# Patient Record
Sex: Female | Born: 1970 | Race: Black or African American | Hispanic: No | Marital: Single | Smoking: Never smoker
Health system: Southern US, Community
[De-identification: ages and names within clinical notes are randomized; demographics above are authoritative.]

## PROBLEM LIST (undated history)

## (undated) DIAGNOSIS — E559 Vitamin D deficiency, unspecified: Secondary | ICD-10-CM

## (undated) DIAGNOSIS — F32A Depression, unspecified: Secondary | ICD-10-CM

## (undated) DIAGNOSIS — F419 Anxiety disorder, unspecified: Secondary | ICD-10-CM

## (undated) DIAGNOSIS — M5134 Other intervertebral disc degeneration, thoracic region: Secondary | ICD-10-CM

## (undated) DIAGNOSIS — M069 Rheumatoid arthritis, unspecified: Secondary | ICD-10-CM

## (undated) DIAGNOSIS — T8859XA Other complications of anesthesia, initial encounter: Secondary | ICD-10-CM

## (undated) DIAGNOSIS — T884XXA Failed or difficult intubation, initial encounter: Secondary | ICD-10-CM

## (undated) DIAGNOSIS — M545 Low back pain, unspecified: Secondary | ICD-10-CM

## (undated) DIAGNOSIS — F329 Major depressive disorder, single episode, unspecified: Secondary | ICD-10-CM

## (undated) DIAGNOSIS — T7840XA Allergy, unspecified, initial encounter: Secondary | ICD-10-CM

## (undated) DIAGNOSIS — M1990 Unspecified osteoarthritis, unspecified site: Secondary | ICD-10-CM

## (undated) DIAGNOSIS — E785 Hyperlipidemia, unspecified: Secondary | ICD-10-CM

## (undated) DIAGNOSIS — E669 Obesity, unspecified: Secondary | ICD-10-CM

## (undated) DIAGNOSIS — D649 Anemia, unspecified: Secondary | ICD-10-CM

## (undated) DIAGNOSIS — R51 Headache: Secondary | ICD-10-CM

## (undated) DIAGNOSIS — E119 Type 2 diabetes mellitus without complications: Secondary | ICD-10-CM

## (undated) DIAGNOSIS — E049 Nontoxic goiter, unspecified: Secondary | ICD-10-CM

## (undated) DIAGNOSIS — K635 Polyp of colon: Secondary | ICD-10-CM

## (undated) DIAGNOSIS — G8929 Other chronic pain: Secondary | ICD-10-CM

## (undated) DIAGNOSIS — K219 Gastro-esophageal reflux disease without esophagitis: Secondary | ICD-10-CM

## (undated) DIAGNOSIS — J45909 Unspecified asthma, uncomplicated: Secondary | ICD-10-CM

## (undated) DIAGNOSIS — I10 Essential (primary) hypertension: Secondary | ICD-10-CM

## (undated) DIAGNOSIS — Z9889 Other specified postprocedural states: Secondary | ICD-10-CM

## (undated) DIAGNOSIS — M797 Fibromyalgia: Secondary | ICD-10-CM

## (undated) DIAGNOSIS — J302 Other seasonal allergic rhinitis: Secondary | ICD-10-CM

## (undated) DIAGNOSIS — R519 Headache, unspecified: Secondary | ICD-10-CM

## (undated) HISTORY — DX: Type 2 diabetes mellitus without complications: E11.9

## (undated) HISTORY — DX: Low back pain, unspecified: M54.50

## (undated) HISTORY — DX: Major depressive disorder, single episode, unspecified: F32.9

## (undated) HISTORY — DX: Fibromyalgia: M79.7

## (undated) HISTORY — DX: Polyp of colon: K63.5

## (undated) HISTORY — DX: Headache: R51

## (undated) HISTORY — DX: Unspecified osteoarthritis, unspecified site: M19.90

## (undated) HISTORY — DX: Vitamin D deficiency, unspecified: E55.9

## (undated) HISTORY — PX: ABDOMINAL HYSTERECTOMY: SHX81

## (undated) HISTORY — DX: Headache, unspecified: R51.9

## (undated) HISTORY — PX: PARTIAL HYSTERECTOMY: SHX80

## (undated) HISTORY — DX: Obesity, unspecified: E66.9

## (undated) HISTORY — DX: Unspecified asthma, uncomplicated: J45.909

## (undated) HISTORY — DX: Anxiety disorder, unspecified: F41.9

## (undated) HISTORY — DX: Nontoxic goiter, unspecified: E04.9

## (undated) HISTORY — DX: Anemia, unspecified: D64.9

## (undated) HISTORY — PX: WISDOM TOOTH EXTRACTION: SHX21

## (undated) HISTORY — DX: Allergy, unspecified, initial encounter: T78.40XA

## (undated) HISTORY — DX: Gastro-esophageal reflux disease without esophagitis: K21.9

## (undated) HISTORY — DX: Other intervertebral disc degeneration, thoracic region: M51.34

## (undated) HISTORY — DX: Depression, unspecified: F32.A

## (undated) HISTORY — PX: BARTHOLIN GLAND CYST EXCISION: SHX565

## (undated) HISTORY — DX: Other chronic pain: G89.29

## (undated) HISTORY — DX: Failed or difficult intubation, initial encounter: T88.4XXA

## (undated) HISTORY — DX: Rheumatoid arthritis, unspecified: M06.9

## (undated) HISTORY — DX: Other seasonal allergic rhinitis: J30.2

## (undated) HISTORY — DX: Hyperlipidemia, unspecified: E78.5

---
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
## 2001-01-29 ENCOUNTER — Ambulatory Visit (HOSPITAL_COMMUNITY): Admission: RE | Admit: 2001-01-29 | Discharge: 2001-01-29 | Payer: Self-pay | Admitting: Pulmonary Disease

## 2003-03-13 HISTORY — PX: BACK SURGERY: SHX140

## 2003-06-01 ENCOUNTER — Ambulatory Visit (HOSPITAL_COMMUNITY): Admission: RE | Admit: 2003-06-01 | Discharge: 2003-06-02 | Payer: Self-pay | Admitting: Neurosurgery

## 2004-07-21 ENCOUNTER — Ambulatory Visit (HOSPITAL_COMMUNITY): Admission: RE | Admit: 2004-07-21 | Discharge: 2004-07-21 | Payer: Self-pay | Admitting: Family Medicine

## 2004-12-21 ENCOUNTER — Other Ambulatory Visit: Admission: RE | Admit: 2004-12-21 | Discharge: 2004-12-21 | Payer: Self-pay | Admitting: Obstetrics and Gynecology

## 2006-06-06 ENCOUNTER — Encounter (INDEPENDENT_AMBULATORY_CARE_PROVIDER_SITE_OTHER): Payer: Self-pay | Admitting: Specialist

## 2006-06-06 ENCOUNTER — Inpatient Hospital Stay (HOSPITAL_COMMUNITY): Admission: RE | Admit: 2006-06-06 | Discharge: 2006-06-07 | Payer: Self-pay | Admitting: Obstetrics and Gynecology

## 2006-10-22 ENCOUNTER — Encounter: Admission: RE | Admit: 2006-10-22 | Discharge: 2007-01-20 | Payer: Self-pay | Admitting: *Deleted

## 2007-01-22 ENCOUNTER — Encounter: Admission: RE | Admit: 2007-01-22 | Discharge: 2007-01-22 | Payer: Self-pay | Admitting: *Deleted

## 2007-01-29 ENCOUNTER — Ambulatory Visit (HOSPITAL_COMMUNITY): Admission: RE | Admit: 2007-01-29 | Discharge: 2007-01-29 | Payer: Self-pay | Admitting: Family Medicine

## 2009-08-24 ENCOUNTER — Ambulatory Visit (HOSPITAL_COMMUNITY): Admission: RE | Admit: 2009-08-24 | Discharge: 2009-08-24 | Payer: Self-pay | Admitting: Internal Medicine

## 2010-04-01 ENCOUNTER — Encounter: Payer: Self-pay | Admitting: Family Medicine

## 2010-07-28 NOTE — H&P (Signed)
NAME:  Norma [name], Norma [name] NO.:  [id number]   MEDICAL RECORD NO.:  [id number]          PATIENT TYPE:  AMB   LOCATION:  SDC                           FACILITY:  [facility]   PHYSICIAN:  Zelphia Cairo, MD    DATE OF BIRTH:  December 09, 1970   DATE OF ADMISSION:  DATE OF DISCHARGE:                              HISTORY & PHYSICAL   A 40 year old black female who initially presented to the office in  October 2006 with increasing pelvic pain and heavy menstrual cycles.  Ultrasounds revealed a fibroid uterus.   PAST MEDICAL HISTORY:  Is negative.   SURGICAL HISTORY:  Includes a Bartholin cyst and L4-L5 back surgery.   MEDICATIONS:  None.   ALLERGIES:  None.   GYN HISTORY:  She has regular menstrual cycles every month that are  heavy and increasingly uncomfortable. Her last Pap smear done October  2007 was normal.   OB HISTORY:  Significant for one full-term vaginal delivery at 40 weeks.  Her son weight 6 pounds 13 ounces.   FAMILY HISTORY:  Is noncontributory.   PHYSICAL EXAM:  VITAL SIGNS:  Height 5 feet 6-1/2 inches, weight 206,  blood pressure 120/86.  HEENT:  Head, neck normal.  No thyromegaly or nodularity.  HEART: Regular.  LUNGS: Clear.  BREASTS:  Symmetrical.  No masses or lesions.  ABDOMEN: Soft, nontender.  PELVIC: Exam reveals normal external female genitalia.  Vagina and  cervix are normal.  No lesions.  Uterus is mobile and enlarged and  nontender.  No adnexal masses.   Pelvic ultrasound was performed. Ultrasound showed uterus measuring 11.5  cm x 6.2 cm x 7.1 cm.  Multiple fibroids were noted within the cavity  ranging between 10 mm and 3 cm.  Bilateral ovaries appeared normal.  There was no free fluid.   ASSESSMENT/PLAN:  40 year old black female with a fibroid uterus and  pelvic pain. Treatment options were discussed with the patient.  The  patient elects to proceed with LAVH. The patient understands risks,  benefits and alternatives including the risk  of laparotomy given size of  incision. Informed consent was obtained.      Zelphia Cairo, MD  Electronically Signed     GA/MEDQ  D:  06/05/2006  T:  06/05/2006  Job:  [id number]

## 2010-07-28 NOTE — Op Note (Signed)
NAME:  MAEBELL, LYVERS                        ACCOUNT NO.:  [id number]   MEDICAL RECORD NO.:  [id number]                   PATIENT TYPE:  OIB   LOCATION:  NA                                   FACILITY:  [facility]   PHYSICIAN:  Reinaldo Meeker, M.D.              DATE OF BIRTH:  Jun 17, 1970   DATE OF PROCEDURE:  06/01/2003  DATE OF DISCHARGE:                                 OPERATIVE REPORT   PREOPERATIVE DIAGNOSIS:  Herniated disc L4-L5, left.   POSTOPERATIVE DIAGNOSIS:  Herniated disc L4-L5, left.   PROCEDURE:  Left L4-L5 lumbar microdiscectomy.   SECONDARY PROCEDURE:  Microdissection L4-L5 disc and L5 nerve root.   SURGEON:  Reinaldo Meeker, M.D.   ASSISTANT:  Kathaleen Maser. Pool, M.D.   PROCEDURE IN DETAIL:  After being placed in the prone position, the  patient's back was prepped and draped in the usual sterile fashion.  Localizing x-ray was taken prior to the incision to identify the appropriate  level.  A midline incision was made above the spinous processes of L4 and  L5.  Using Bovie cutting current, the incision was carried to the spinous  processes.  Subperiosteal dissection was then carried out along the spinous  processes and lamina.  A self-retaining retractor was placed for exposure.  A second x-ray showed we approached the appropriate level.  Using the high  speed drill, the inferior 1/3 of the L4 lamina and the medial 1/3 of the  facet joint were removed.  The drill was then used to remove the superior  1/3 of the L5 lamina.  Residual bone and ligamentum of flavum were removed  in a piecemeal fashion.  The microscope was draped, brought on the field,  and used for the remainder of the case.   Using microdissection technique, the lateral aspect of the thecal sac and L5  nerve root were identified.  Further coagulation was carried down to the  floor of the canal to identify the L4-L5 disc which was found to be markedly  herniated.  After coagulating on the annulus, the  annulus was incised with a  15 blade.  Using pituitary rongeurs and curets, thorough disc space clean  out was carried out and care was taken to avoid injury to the neural  elements and this was successfully done.  At this point, inspection was  carried out in all directions for any evidence of residual compression and  none could be identified.  Large amounts of irrigation was carried out.  Any  bleeding was controlled with bipolar coagulation and Gelfoam.  The wound was  then closed using interrupted Vicryl in the muscle, fascia, subcutaneous,  and subcuticular tissues, and staples on the skin.  Sterile dressings were  then applied and the patient was extubated and taken to the recovery room in  stable condition.  Reinaldo Meeker, M.D.    ROK/MEDQ  D:  06/01/2003  T:  06/02/2003  Job:  [id number]

## 2010-07-28 NOTE — Op Note (Signed)
Norma [name], FONT              ACCOUNT NO.:  [id number]   MEDICAL RECORD NO.:  [id number]          PATIENT TYPE:  INP   LOCATION:  9319                          FACILITY:  [facility]   PHYSICIAN:  Zelphia Cairo, MD    DATE OF BIRTH:  29-Dec-1970   DATE OF PROCEDURE:  06/06/2006  DATE OF DISCHARGE:                               OPERATIVE REPORT   PREOPERATIVE DIAGNOSES:  1. Menorrhagia.  2. Pelvic pain.   POSTOPERATIVE DIAGNOSES:  1. Menorrhagia.  2. Pelvic pain.   PROCEDURE:  Laparoscopic-assisted vaginal hysterectomy.   SURGEON:  Zelphia Cairo, MD   ASSISTANT:  Freddy Finner, M.D.   ESTIMATED BLOOD LOSS:  500 mL.   URINE OUTPUT:  250 mL.   IV FLUIDS:  2 liters.   SPECIMEN:  Uterus and cervix.   COMPLICATIONS:  None.  Condition stable and extubated to recovery room.   PROCEDURE:  The patient was taken to the operating room where general  anesthesia was obtained she was placed in the dorsal lithotomy position  using Allen stirrups.  She was prepped and draped in sterile fashion and  a bivalve speculum was placed in the vagina and a single-tooth tenaculum  was placed on the anterior lip of the cervix.  A Hulka clamp was then  placed to provide uterine manipulation.  The single-tooth tenaculum and  speculum were then removed from the vagina.  A Foley catheter was then  placed sterilely.  Our attention was then turned to the abdomen.  An  infraumbilical skin incision was made with the scalpel and a blunt  trocar was then placed under direct visualization.  Once intraperitoneal  placement was confirmed, CO2 was turned on and the abdomen and pelvis  were insufflated.  A survey of the patient's abdomen and pelvis revealed  a large fibroid uterus.  Bilateral adnexa appeared normal without  adhesions.  Ovaries were normal in appearance.  The patient was then  placed in Trendelenburg.  A suprapubic incision was made with the  scalpel and a 5 mm trocar was placed under direct  visualization and a  blunt probe was used to sweep the bowel out of the cul-de-sac.   Next the Gyrus was inserted through the operative port of our  laparoscope.  The uterus was manipulated to the patient's left and a  blunt probe was used to provide additional traction on the uterus due to  its size.  The utero-ovarian ligament and fallopian tube were grasped  with the Gyrus, cauterized and cut.  This was continued down the broad  ligament staying adjacent to the uterus and assuring hemostasis.  The  round ligament was grasped with the LigaSure, cauterized and cut using  the Gyrus.  Hemostasis was assured and our attention was turned to the  left adnexa.  The uterus was pushed toward the right and the blunt probe  was used to provide additional visualization.  The left adnexa was swept  out of the ovarian fossa and appeared normal.  The fallopian tube and  utero-ovarian ligament were grasped with the Gyrus cauterized and cut.  This was continued  again staying close to the uterus down the level of  the broad ligament.  The round ligament was grasped, cauterized and cut  using the Gyrus.  Hemostasis was assured bilaterally and our attention  was then turned to the vagina.  Used at all instruments were removed  from the abdomen.  The CO2 and lights were turned off.   Our attention was then turned to the vagina.  A weighted speculum was  placed into the vagina and a Deaver was placed anteriorly.  The cervix  was grasped with a double tooth tenaculum, the cervix was tilted upward.  The posterior fornix was grasped with an Allis clamp and the posterior  cul-de-sac was entered sharply using Metzenbaum scissors. A long  weighted speculum was then placed into the posterior cul-de-sac and a  circumferential incision was made around the cervix using the Bovie.  The uterosacral ligaments were then cauterized bilaterally using the  LigaSure, cut using Mayo scissors.  The anterior cul-de-sac was  then  entered sharply using Mayo scissors and then the Deaver was placed into  the anterior cul-de-sac.  The cardinal ligaments were then clamped  bilaterally using the LigaSure, cauterized and cut using Mayo scissors.  Hemostasis was assured.  The uterine arteries and broad ligament were  then serially clamped with the LigaSure, cauterized and transected using  Mayo scissors.  The uterus was then delivered through the posterior cul-  de-sac using thyroid tenaculums.  The vaginal cuff angles were then  closed using a figure-of-eight stitch of #0 Vicryl.  Bilateral  uterosacral ligaments were transfixed using a 0 Vicryl stitch.  The  remainder of the vaginal cuff was then closed horizonally using figure-  of-eight sutures of #0 Vicryl in interrupted fashion.  Hemostasis was  assured and our attention was then turned to the abdomen.   The abdomen and pelvis were then reinsufflated.  The laparoscope was  reinserted into the umbilical port and all pedicles were visualized to  assure hemostasis.  The right ovary had some oozing and this was  cauterized using the Gyrus.  The pelvis was then irrigated, the irrigant  was suctioned and our pedicles were again surveyed to ensure hemostasis.  I was unable to visualize the ureter on the left due to overlying bowel  and difficult visualization.  The right ureter also could not be  visualized through the peritoneum following the procedure.  The  instruments were then removed from the abdomen.  CO2 was removed and the  skin was closed using 3-0 Vicryl.  Sponge lap, needle and instrument  counts were correct x2.  The patient was extubated and taken to the  recovery room in stable condition.      Zelphia Cairo, MD  Electronically Signed     GA/MEDQ  D:  06/06/2006  T:  06/06/2006  Job:  [id number]

## 2010-11-22 ENCOUNTER — Ambulatory Visit (HOSPITAL_COMMUNITY)
Admission: RE | Admit: 2010-11-22 | Discharge: 2010-11-22 | Disposition: A | Discharge status: Home / Self Care | Payer: BC Managed Care – PPO | Source: Ambulatory Visit | Attending: Family Medicine | Admitting: Family Medicine

## 2010-11-22 ENCOUNTER — Encounter (HOSPITAL_COMMUNITY): Payer: Self-pay

## 2010-11-22 ENCOUNTER — Other Ambulatory Visit (HOSPITAL_COMMUNITY): Payer: Self-pay | Admitting: Family Medicine

## 2010-11-22 DIAGNOSIS — R1031 Right lower quadrant pain: Secondary | ICD-10-CM

## 2010-11-22 DIAGNOSIS — I10 Essential (primary) hypertension: Secondary | ICD-10-CM

## 2010-11-22 HISTORY — DX: Essential (primary) hypertension: I10

## 2010-11-22 MED ORDER — IOHEXOL 300 MG/ML  SOLN
100.0000 mL | Freq: Once | INTRAMUSCULAR | Status: AC | PRN
  Administered 2010-11-22: 100 mL via INTRAVENOUS

## 2011-01-01 ENCOUNTER — Encounter: Payer: Self-pay | Admitting: Internal Medicine

## 2011-01-11 ENCOUNTER — Encounter: Payer: Self-pay | Admitting: Internal Medicine

## 2011-01-18 ENCOUNTER — Encounter: Payer: Self-pay | Admitting: Internal Medicine

## 2011-01-22 ENCOUNTER — Ambulatory Visit (INDEPENDENT_AMBULATORY_CARE_PROVIDER_SITE_OTHER): Payer: BC Managed Care – PPO | Admitting: Internal Medicine

## 2011-01-22 ENCOUNTER — Encounter: Payer: Self-pay | Admitting: Internal Medicine

## 2011-01-22 VITALS — BP 132/70 | HR 88 | Ht 66.0 in | Wt 242.0 lb

## 2011-01-22 DIAGNOSIS — F329 Major depressive disorder, single episode, unspecified: Secondary | ICD-10-CM | POA: Insufficient documentation

## 2011-01-22 DIAGNOSIS — K589 Irritable bowel syndrome without diarrhea: Secondary | ICD-10-CM | POA: Insufficient documentation

## 2011-01-22 DIAGNOSIS — K219 Gastro-esophageal reflux disease without esophagitis: Secondary | ICD-10-CM

## 2011-01-22 DIAGNOSIS — F32A Depression, unspecified: Secondary | ICD-10-CM | POA: Insufficient documentation

## 2011-01-22 DIAGNOSIS — I10 Essential (primary) hypertension: Secondary | ICD-10-CM | POA: Insufficient documentation

## 2011-01-22 DIAGNOSIS — E785 Hyperlipidemia, unspecified: Secondary | ICD-10-CM | POA: Insufficient documentation

## 2011-01-22 MED ORDER — ALIGN 4 MG PO CAPS: 1.0000 | ORAL_CAPSULE | Freq: Every day | ORAL | Status: DC

## 2011-01-22 NOTE — Progress Notes (Signed)
Subjective:    Patient ID: Norma [name], female    DOB: [birth year], 40 y.o.   MRN: [id number]  HPI [name] is a 40 year old female with a past medical history of hypertension, hyperlipidemia, anxiety and depression, GERD who is seen in consultation at the request of Dr. Phillips Odor for evaluation of abdominal pain. The patient states that she was referred over for right lower quadrant abdominal pain which she first noticed in September 2012. She had this evaluated further with CT scan which she reports was unremarkable. She now reports resolution of the right lower corner pain and she feels this was related to right hip pain. She states that on your yearly basis in the fall of the year she develops right-sided hip pain and swelling. She feels that her right lower corner pain was radiation from her right hip pain. She is taking meloxicam for the hip pain and right lower corner abdominal pain has also completely resolved. She also reports a right sided back versus abdominal pain. This pain is in the right mid axillary line. This pain started in or around November 2011. She describes this as a dull, constant pain. She rates his pain about a 1/10. Not exactly sure what makes the pain better or worse, but she knows that it is worse when she is driving. She feels this is related to position in which she is sitting. She denies nausea or vomiting. No heartburn. No dysphagia or odynophagia.  Appetite is very good and her weight is stable. She denies rectal bleeding or melena. No nocturnal pain. Pain does not worsen with eating. She began taking Nexium 40 mg daily and February 2012. She feels this medicine was started to see if her right-sided pain was acid related. She does not feel it helped particularly much. She denies ever having a history or problems with heartburn.  Regarding her bowel movements, she reports that she is usually constipated. She does report intermittent loose stools, but these are unpredictable.  She wonders if this might be diet related. Her bowel habits are long-standing and in the past she has used stool softeners, but she avoids using this daily because she does not want these to be habit forming.  No fevers chills or night sweats.   Review of Systems Constitutional: Negative for fever, chills, night sweats, activity change, appetite change and unexpected weight change HEENT: Negative for sore throat, mouth sores and trouble swallowing. Eyes: Negative for visual disturbance Respiratory: Negative for cough, chest tightness and shortness of breath Cardiovascular: Negative for chest pain, palpitations and lower extremity swelling Gastrointestinal: See history of present illness Genitourinary: Negative for dysuria and hematuria. Musculoskeletal: Positive for back pain, positive for hip pain, negative for other arthralgias and myalgias Skin: Negative for rash or color change Neurological: Positive for headaches, negative for weakness, numbness Hematological: Negative for adenopathy, negative for easy bruising/bleeding Psychiatric/behavioral: Positive for history of depressed mood, negative for anxiety (though not a problem today)   Past Medical History  Diagnosis Date  . Hypertension   . Hyperlipidemia   . Obesity   . GERD (gastroesophageal reflux disease)   . Depression   . Anxiety   . Anemia   . Chronic headache    Current Outpatient Prescriptions  Medication Sig Dispense Refill  . atorvastatin (LIPITOR) 10 MG tablet Take 10 mg by mouth daily.        [name] Kitchen esomeprazole (NEXIUM) 40 MG capsule Take 40 mg by mouth daily before breakfast.        .  losartan (COZAAR) 50 MG tablet Take 50 mg by mouth daily.        . meloxicam (MOBIC) 15 MG tablet Take 15 mg by mouth daily.        . Probiotic Product (ALIGN) 4 MG CAPS Take 1 capsule by mouth daily.  28 capsule  0   No Known Allergies  Family History  Problem Relation Age of Onset  . Colon polyps Maternal Uncle   . Diabetes  Mother   . Diabetes Father   . Liver disease Maternal Grandmother     Social History  . Marital Status: Single    Number of Children: 1   Occupational History  . Print production planner    Social History Main Topics  . Smoking status: Never Smoker   . Smokeless tobacco: Never Used  . Alcohol Use: No  . Drug Use: No      Objective:   Physical Exam BP 132/70  Pulse 88  Ht 5\' 6"  (1.676 m)  Wt 242 lb (109.77 kg)  BMI 39.06 kg/m2 Constitutional: Well-developed and well-nourished. No distress. HEENT: Normocephalic and atraumatic. Oropharynx is clear and moist. No oropharyngeal exudate. Conjunctivae are normal. Pupils are equal round and reactive to light. No scleral icterus. Neck: Neck supple. Trachea midline. Cardiovascular: Normal rate, regular rhythm and intact distal pulses. No M/R/G Pulmonary/chest: Effort normal and breath sounds normal. No wheezing, rales or rhonchi. Abdominal: Soft, nontender, nondistended. Bowel sounds active throughout. There are no masses palpable. No hepatosplenomegaly. Extremities: no clubbing, cyanosis, or edema Lymphadenopathy: No cervical adenopathy noted. Neurological: Alert and oriented to person place and time. Skin: Skin is warm and dry. No rashes noted. Psychiatric: Normal mood and affect. Behavior is normal.  CT scan abdomen and pelvis with contrast 11/22/2010 No explanation for the patient's right lower quadrant abdominal pain. Specifically, normal appearance of the appendix, no renal stones or evidence of urinary or enteric obstruction.     Assessment & Plan:  40 year old female with a past medical history of hypertension, hyperlipidemia, anxiety and depression, GERD who is seen in consultation at the request of Dr. Phillips Odor for evaluation of abdominal pain  1. Abd pain -- the patient's pain in her right side, has been present for about one year. There are no other alarm symptoms and her CT scan was normal, which is very reassuring. I am not  convinced that this is related to her GI tract. It may in fact be more musculoskeletal in nature.  Her right lower quadrant pain also resolved with treatment of hip pain. It is certainly reasonable that radiation a right hip pain could manifest itself as right lower corner abdominal pain. Also given the fact that this pain has resolved, nothing further is felt necessary at present. Finally, constipation could contribute to abdominal pain and see #2.  2. Constipation/IBS -- the patient does have issues with constipation, which are long-standing. She also reports occasional bloating, and intermittent loose stools.  This is most consistent with irritable bowel syndrome. She has not had rectal bleeding, loss of weight, or change in her stool habits/consistency. We have discussed the addition of MiraLAX 17 g daily to avoid constipation. Should her stools become too loose she can take this every other day. We also discussed Align one tablet daily for IBS and to promote normal digestion and regularity. She will try this medication going forward. I will see her back in 2 or 3 months to assess her response.  3. GERD -- the patient denies a history of  heartburn, and I'm not convinced that her pain is acid related. Therefore we have discussed a trial of discontinuation of Nexium. I've advised that should she develop heartburn, epigastric pain , nausea then she may in fact need this medication long-term. She does report a history of hoarseness and cough, which may be a manifestation of reflux disease. Should cough or hoarseness return, and I recommend she resume her Nexium 40 mg daily.  4. CRC screening -- the patient is average risk for colorectal cancer screening, and therefore I recommended she begin screening at age 7. Guidelines support screening at 45 for African Americans.

## 2011-01-22 NOTE — Patient Instructions (Signed)
Discontinue the Nexium.  Take Miralax 17 grams in a full glass of water once daily and then 2 doses every other day. Titrate it as needed.  3-4 month follow up.

## 2011-02-05 ENCOUNTER — Other Ambulatory Visit: Payer: Self-pay | Admitting: Internal Medicine

## 2011-02-06 MED ORDER — ESOMEPRAZOLE MAGNESIUM 40 MG PO CPDR: 40.0000 mg | DELAYED_RELEASE_CAPSULE | Freq: Every day | ORAL | Status: DC

## 2011-04-23 ENCOUNTER — Other Ambulatory Visit (HOSPITAL_COMMUNITY): Payer: Self-pay | Admitting: Family Medicine

## 2011-04-23 ENCOUNTER — Ambulatory Visit (HOSPITAL_COMMUNITY)
Admission: RE | Admit: 2011-04-23 | Discharge: 2011-04-23 | Disposition: A | Discharge status: Home / Self Care | Payer: BC Managed Care – PPO | Source: Ambulatory Visit | Attending: Family Medicine | Admitting: Family Medicine

## 2011-04-23 DIAGNOSIS — I10 Essential (primary) hypertension: Secondary | ICD-10-CM

## 2011-04-23 DIAGNOSIS — E785 Hyperlipidemia, unspecified: Secondary | ICD-10-CM

## 2011-04-23 DIAGNOSIS — R059 Cough, unspecified: Secondary | ICD-10-CM | POA: Insufficient documentation

## 2011-04-23 DIAGNOSIS — R05 Cough: Secondary | ICD-10-CM | POA: Insufficient documentation

## 2011-04-23 DIAGNOSIS — K219 Gastro-esophageal reflux disease without esophagitis: Secondary | ICD-10-CM

## 2011-08-28 ENCOUNTER — Other Ambulatory Visit (HOSPITAL_COMMUNITY): Payer: Self-pay | Admitting: Family Medicine

## 2011-08-28 ENCOUNTER — Ambulatory Visit (HOSPITAL_COMMUNITY)
Admission: RE | Admit: 2011-08-28 | Discharge: 2011-08-28 | Disposition: A | Discharge status: Home / Self Care | Payer: BC Managed Care – PPO | Source: Ambulatory Visit | Attending: Family Medicine | Admitting: Family Medicine

## 2011-08-28 DIAGNOSIS — M25529 Pain in unspecified elbow: Secondary | ICD-10-CM | POA: Insufficient documentation

## 2011-08-28 DIAGNOSIS — R109 Unspecified abdominal pain: Secondary | ICD-10-CM

## 2011-08-28 DIAGNOSIS — R1011 Right upper quadrant pain: Secondary | ICD-10-CM

## 2011-09-14 ENCOUNTER — Ambulatory Visit (HOSPITAL_COMMUNITY): Payer: BC Managed Care – PPO

## 2012-10-17 ENCOUNTER — Encounter: Payer: Self-pay | Admitting: Internal Medicine

## 2012-10-21 ENCOUNTER — Ambulatory Visit (INDEPENDENT_AMBULATORY_CARE_PROVIDER_SITE_OTHER): Payer: BC Managed Care – PPO | Admitting: Internal Medicine

## 2012-10-21 ENCOUNTER — Encounter: Payer: Self-pay | Admitting: Internal Medicine

## 2012-10-21 VITALS — BP 132/98 | HR 88 | Ht 66.0 in | Wt 246.8 lb

## 2012-10-21 DIAGNOSIS — R059 Cough, unspecified: Secondary | ICD-10-CM

## 2012-10-21 DIAGNOSIS — R05 Cough: Secondary | ICD-10-CM

## 2012-10-21 DIAGNOSIS — R1011 Right upper quadrant pain: Secondary | ICD-10-CM

## 2012-10-21 DIAGNOSIS — K589 Irritable bowel syndrome without diarrhea: Secondary | ICD-10-CM

## 2012-10-21 DIAGNOSIS — R1031 Right lower quadrant pain: Secondary | ICD-10-CM

## 2012-10-21 DIAGNOSIS — R109 Unspecified abdominal pain: Secondary | ICD-10-CM

## 2012-10-21 MED ORDER — HYOSCYAMINE SULFATE 0.125 MG SL SUBL: 0.1250 mg | SUBLINGUAL_TABLET | SUBLINGUAL | Status: DC | PRN

## 2012-10-21 MED ORDER — POLYETHYLENE GLYCOL 3350 17 GM/SCOOP PO POWD: 17.0000 g | Freq: Every day | ORAL | Status: DC

## 2012-10-21 MED ORDER — OMEPRAZOLE 40 MG PO CPDR: 40.0000 mg | DELAYED_RELEASE_CAPSULE | Freq: Every day | ORAL | Status: DC

## 2012-10-21 NOTE — Progress Notes (Signed)
Patient ID: Norma [name], female   DOB: Sep 16, 1970, 42 y.o.   MRN: [id number] HPI: Norma [name] is a 42 year old female with a past medical history of hypertension, hyperlipidemia, anxiety and depression, GERD who is seen in followup.  She was initially seen in November 2012 to evaluate right-sided abdominal pain. At that time she had a normal CT scan and her pain was felt most likely to be musculoskeletal in nature. She reports that that pain overall is better but can still "flare". It happens rarely and now she relates this to more right upper quadrant type pain. The pain she reports today his right lower quadrant abdominal pain. Initially she felt like this might have been ovarian pain she reports having her GYN physician evaluate this both with pelvic exam and pelvic ultrasound. She reports no findings to explain her symptoms and thus she was sent back for our evaluation. She reports this has been present for several months but worse over the last 5-6 weeks. She reports it hurts to walk. At times she feels like having a bowel movement makes this pain significantly better. She reports a history of constipation, but more recently her stools have been more frequent somewhat looser. No blood or melena. She does recall 3 or 4 months ago developing some lower back pain which did not respond to NSAIDs or muscle relaxers but did respond to MiraLax. She reports she was constipated at that time and when she had a good bowel movement the pain disappeared. She reports a good appetite with no nausea or vomiting. She has noticed some cough and phlegm which she relates to her GERD. She started over-the-counter Prevacid for this pain. She denies heartburn, dysphagia or odynophagia. Prevacid seems to help with her cough. No weight loss. No fevers or chills.  Past Medical History  Diagnosis Date  . Hypertension   . Hyperlipidemia   . Obesity   . GERD (gastroesophageal reflux disease)   . Depression   . Anxiety   .  Anemia   . Chronic headache     Past Surgical History  Procedure Laterality Date  . Partial hysterectomy    . Bartholin gland cyst excision    . Back surgery      Current Outpatient Prescriptions  Medication Sig Dispense Refill  . lansoprazole (PREVACID) 15 MG capsule Take 15 mg by mouth daily.      [name] Kitchen losartan (COZAAR) 50 MG tablet Take 100 mg by mouth daily.       . meloxicam (MOBIC) 15 MG tablet Take 15 mg by mouth daily.        . hyoscyamine (LEVSIN SL) 0.125 MG SL tablet Place 1 tablet (0.125 mg total) under the tongue every 4 (four) hours as needed for cramping.  30 tablet  0  . omeprazole (PRILOSEC) 40 MG capsule Take 1 capsule (40 mg total) by mouth daily.  90 capsule  3  . polyethylene glycol powder (GLYCOLAX/MIRALAX) powder Take 17 g by mouth daily.  255 g  3   No current facility-administered medications for this visit.    No Known Allergies  Family History  Problem Relation Age of Onset  . Colon polyps Maternal Uncle   . Diabetes Mother   . Diabetes Father   . Liver disease Maternal Grandmother     History  Substance Use Topics  . Smoking status: Never Smoker   . Smokeless tobacco: Never Used  . Alcohol Use: No    ROS: As per history of present illness,  otherwise negative  BP 132/98  Pulse 88  Ht 5\' 6"  (1.676 m)  Wt 246 lb 12.8 oz (111.948 kg)  BMI 39.85 kg/m2 Constitutional: Well-developed and well-nourished. No distress. HEENT: Normocephalic and atraumatic. Oropharynx is clear and moist. No oropharyngeal exudate. Conjunctivae are normal.  No scleral icterus. Neck: Neck supple. Trachea midline. Cardiovascular: Normal rate, regular rhythm and intact distal pulses. No M/R/G Pulmonary/chest: Effort normal and breath sounds normal. No wheezing, rales or rhonchi. Abdominal: Soft, obese, nontender, nondistended. Bowel sounds active throughout.  Extremities: no clubbing, cyanosis, or edema Lymphadenopathy: No cervical adenopathy noted. Neurological: Alert  and oriented to person place and time. Skin: Skin is warm and dry. No rashes noted. Psychiatric: Normal mood and affect. Behavior is normal.  CT scan abdomen and pelvis with contrast 11/22/2010  No explanation for the patient's right lower quadrant abdominal pain. Specifically, normal appearance of the appendix, no renal stones or evidence of urinary or enteric obstruction.   ASSESSMENT/PLAN: 42 year old female with a past medical history of hypertension, hyperlipidemia, anxiety and depression, GERD who is seen in followup.   1.  RLQ pain, hx of constipation, change in bowel habits -- based on her new right lower quadrant pain pain change in bowel habits, I have recommended proceeding to colonoscopy.   We discussed the test today including the risks and benefits and she is agreeable to proceed.  I would like for her to work to avoid constipation, and if she does feel constipated I've asked that she resume MiraLax 17 g daily. I will give her prescription for Levsin to be used for cramping lower abdominal pain.  2.  Intermittent right upper quadrant pain/cough -- she does not have typical heartburn, but cough could be related to reflux. Given her intermittent right upper quadrant pain which is different than her right lower quadrant pain, we will perform upper endoscopy on the same day as her colonoscopy.  Again the risks and benefits of this test were discussed and she is agreeable to proceed. She says that both of these tests be performed in September and we will accommodate her request.   We will request GYN records and pelvic ultrasound performed recently for completeness

## 2012-10-21 NOTE — Patient Instructions (Addendum)
You have been scheduled for a colonoscopy/Endoscopy with propofol. Please follow written instructions given to you at your visit today.  Please pick up your prep kit at the pharmacy within the next 1-3 days. If you use inhalers (even only as needed), please bring them with you on the day of your procedure. Your physician has requested that you go to www.startemmi.com and enter the access code given to you at your visit today. This web site gives a general overview about your procedure. However, you should still follow specific instructions given to you by our office regarding your preparation for the procedure.   We have sent the following medications to your pharmacy for you to pick up at your convenience: Moviprep, Levsin, Miralax; which you can get over the counter.  Omperazole; take one tablet daily                                               We are excited to introduce MyChart, a new best-in-class service that provides you online access to important information in your electronic medical record. We want to make it easier for you to view your health information - all in one secure location - when and where you need it. We expect MyChart will enhance the quality of care and service we provide.  When you register for MyChart, you can:    View your test results.    Request appointments and receive appointment reminders via email.    Request medication renewals.    View your medical history, allergies, medications and immunizations.    Communicate with your physician's office through a password-protected site.    Conveniently print information such as your medication lists.  To find out if MyChart is right for you, please talk to a member of our clinical staff today. We will gladly answer your questions about this free health and wellness tool.  If you are age 43 or older and want a member of your family to have access to your record, you must provide written consent by completing a  proxy form available at our office. Please speak to our clinical staff about guidelines regarding accounts for patients younger than age 86.  As you activate your MyChart account and need any technical assistance, please call the MyChart technical support line at [phone]-CHART [phone]) or email your question to [email or web address]. If you email your question(s), please include your name, a return phone number and the best time to reach you.  If you have non-urgent health-related questions, you can send a message to our office through MyChart at [email or web address]. If you have a medical emergency, call 911.  Thank you for using MyChart as your new health and wellness resource!   MyChart licensed from [employer],  1478-2956. Patents Pending.

## 2012-10-22 ENCOUNTER — Encounter: Payer: Self-pay | Admitting: Internal Medicine

## 2012-11-07 ENCOUNTER — Telehealth: Payer: Self-pay | Admitting: Internal Medicine

## 2012-11-07 MED ORDER — MOVIPREP 100 G PO SOLR: ORAL | Status: DC

## 2012-11-07 NOTE — Telephone Encounter (Signed)
Sent moviprep to [facility] at [place]. Spoke to pt.about what times to take prep. Pt verbalized understanding

## 2012-11-13 ENCOUNTER — Encounter: Payer: Self-pay | Admitting: Internal Medicine

## 2012-11-13 ENCOUNTER — Ambulatory Visit (AMBULATORY_SURGERY_CENTER): Payer: BC Managed Care – PPO | Admitting: Internal Medicine

## 2012-11-13 VITALS — BP 126/63 | HR 69 | Temp 98.4°F | Resp 20 | Ht 66.0 in | Wt 246.0 lb

## 2012-11-13 DIAGNOSIS — D131 Benign neoplasm of stomach: Secondary | ICD-10-CM

## 2012-11-13 DIAGNOSIS — R109 Unspecified abdominal pain: Secondary | ICD-10-CM

## 2012-11-13 DIAGNOSIS — R1031 Right lower quadrant pain: Secondary | ICD-10-CM

## 2012-11-13 DIAGNOSIS — K219 Gastro-esophageal reflux disease without esophagitis: Secondary | ICD-10-CM

## 2012-11-13 DIAGNOSIS — D126 Benign neoplasm of colon, unspecified: Secondary | ICD-10-CM

## 2012-11-13 DIAGNOSIS — K589 Irritable bowel syndrome without diarrhea: Secondary | ICD-10-CM

## 2012-11-13 DIAGNOSIS — R198 Other specified symptoms and signs involving the digestive system and abdomen: Secondary | ICD-10-CM

## 2012-11-13 MED ORDER — SODIUM CHLORIDE 0.9 % IV SOLN: 500.0000 mL | INTRAVENOUS | Status: DC

## 2012-11-13 NOTE — Progress Notes (Signed)
Report to pacu RN, vss, bbs=clear alert and oriented, talking

## 2012-11-13 NOTE — Patient Instructions (Addendum)
Discharge instructions given with verbal understanding. Resume previous medications. Resume previous medications. YOU HAD AN ENDOSCOPIC PROCEDURE TODAY AT THE [facility]: Refer to the procedure report that was given to you for any specific questions about what was found during the examination.  If the procedure report does not answer your questions, please call your gastroenterologist to clarify.  If you requested that your care partner not be given the details of your procedure findings, then the procedure report has been included in a sealed envelope for you to review at your convenience later.  YOU SHOULD EXPECT: Some feelings of bloating in the abdomen. Passage of more gas than usual.  Walking can help get rid of the air that was put into your GI tract during the procedure and reduce the bloating. If you had a lower endoscopy (such as a colonoscopy or flexible sigmoidoscopy) you may notice spotting of blood in your stool or on the toilet paper. If you underwent a bowel prep for your procedure, then you may not have a normal bowel movement for a few days.  DIET: Your first meal following the procedure should be a light meal and then it is ok to progress to your normal diet.  A half-sandwich or bowl of soup is an example of a good first meal.  Heavy or fried foods are harder to digest and may make you feel nauseous or bloated.  Likewise meals heavy in dairy and vegetables can cause extra gas to form and this can also increase the bloating.  Drink plenty of fluids but you should avoid alcoholic beverages for 24 hours.  ACTIVITY: Your care partner should take you home directly after the procedure.  You should plan to take it easy, moving slowly for the rest of the day.  You can resume normal activity the day after the procedure however you should NOT DRIVE or use heavy machinery for 24 hours (because of the sedation medicines used during the test).    SYMPTOMS TO REPORT IMMEDIATELY: A  gastroenterologist can be reached at any hour.  During normal business hours, 8:30 AM to 5:00 PM Monday through Friday, call [phone].  After hours and on weekends, please call the GI answering service at [phone] who will take a message and have the physician on call contact you.   Following lower endoscopy (colonoscopy or flexible sigmoidoscopy):  Excessive amounts of blood in the stool  Significant tenderness or worsening of abdominal pains  Swelling of the abdomen that is new, acute  Fever of 100F or higher  Following upper endoscopy (EGD)  Vomiting of blood or coffee ground material  New chest pain or pain under the shoulder blades  Painful or persistently difficult swallowing  New shortness of breath  Fever of 100F or higher  Black, tarry-looking stools  FOLLOW UP: If any biopsies were taken you will be contacted by phone or by letter within the next 1-3 weeks.  Call your gastroenterologist if you have not heard about the biopsies in 3 weeks.  Our staff will call the home number listed on your records the next business day following your procedure to check on you and address any questions or concerns that you may have at that time regarding the information given to you following your procedure. This is a courtesy call and so if there is no answer at the home number and we have not heard from you through the emergency physician on call, we will assume that you have returned to  your regular daily activities without incident.  SIGNATURES/CONFIDENTIALITY: You and/or your care partner have signed paperwork which will be entered into your electronic medical record.  These signatures attest to the fact that that the information above on your After Visit Summary has been reviewed and is understood.  Full responsibility of the confidentiality of this discharge information lies with you and/or your care-partner. 

## 2012-11-13 NOTE — Progress Notes (Signed)
Called to room to assist during endoscopic procedure.  Patient ID and intended procedure confirmed with present staff. Received instructions for my participation in the procedure from the performing physician.  

## 2012-11-13 NOTE — Op Note (Signed)
[facility] [address] [place], [address]   COLONOSCOPY PROCEDURE REPORT  PATIENT: Norma, [name]  MR#: [id number] BIRTHDATE: 01-01-1971 , 42  yrs. old GENDER: Female ENDOSCOPIST: Beverley Fiedler, MD REFERRED BY: PROCEDURE DATE:  11/13/2012 PROCEDURE:   Colonoscopy with snare polypectomy and Colonoscopy with cold biopsy polypectomy First Screening Colonoscopy - Avg.  risk and is 50 yrs.  old or older - No.  Prior Negative Screening - Now for repeat screening. N/A  History of Adenoma - Now for follow-up colonoscopy & has been > or = to 3 yrs.  N/A  Polyps Removed Today? Yes. ASA CLASS:   Class II INDICATIONS:abdominal pain in the upper right quadrant, Constipation, and Change in bowel habits. MEDICATIONS: MAC sedation, administered by CRNA and propofol (Diprivan) 300mg  IV  DESCRIPTION OF PROCEDURE:   After the risks benefits and alternatives of the procedure were thoroughly explained, informed consent was obtained.  A digital rectal exam revealed no rectal mass.   The LB [id number]  endoscope was introduced through the anus and advanced to the terminal ileum which was intubated for a short distance. No adverse events experienced.   The quality of the prep was good, using MoviPrep  The instrument was then slowly withdrawn as the colon was fully examined.      COLON FINDINGS: The mucosa appeared normal in the terminal ileum. Three sessile polyps ranging between 3-35mm in size were found at the cecum and in the ascending colon.  Polypectomy was performed with cold forceps and using cold snare.  All resections were complete and all polyp tissue was completely retrieved.   Two sessile polyps measuring 3 mm in size were found in the rectosigmoid colon.  Polypectomy was performed with cold forceps. All resections were complete and all polyp tissue was completely retrieved.   The colon mucosa was otherwise normal.  Retroflexed views revealed no  abnormalities. The time to cecum=2 minutes 54 seconds.  Withdrawal time=16 minutes 17 seconds.  The scope was withdrawn and the procedure completed.  COMPLICATIONS: There were no complications.  ENDOSCOPIC IMPRESSION: 1.   Normal mucosa in the terminal ileum 2.   Three sessile polyps ranging between 3-84mm in size were found at the cecum and in the ascending colon; Polypectomy was performed with cold forceps and using cold snare 3.   Two sessile polyps measuring 3 mm in size were found in the rectosigmoid colon; Polypectomy was performed with cold forceps 4.   The colon mucosa was otherwise normal 5.   No findings to explain RLQ pain  RECOMMENDATIONS: 1.  Await pathology results 2.  Timing of repeat colonoscopy will be determined by pathology findings. 3.  You will receive a letter within 1-2 weeks with the results of your biopsy as well as final recommendations.  Please call my office if you have not received a letter after 3 weeks. 4.  Office visit in about 1 month   eSigned:  Beverley Fiedler, MD 11/13/2012 2:30 PM   cc: The Patient and Assunta Found, MD   PATIENT NAME:  Norma, [name] MR#: [id number]

## 2012-11-13 NOTE — Op Note (Signed)
[facility] [address] [place], [address]   ENDOSCOPY PROCEDURE REPORT  PATIENT: Norma [name], Norma [name]  MR#: [id number] BIRTHDATE: [birth year] , 42  yrs. old GENDER: Female ENDOSCOPIST: Beverley Fiedler, MD PROCEDURE DATE:  11/13/2012 PROCEDURE:  EGD w/ biopsy for H.pylori ASA CLASS:     Class II INDICATIONS:  abdominal pain in the upper right quadrant.  cough MEDICATIONS: MAC sedation, administered by CRNA and propofol (Diprivan) 200mg  IV TOPICAL ANESTHETIC: Cetacaine Spray  DESCRIPTION OF PROCEDURE: After the risks benefits and alternatives of the procedure were thoroughly explained, informed consent was obtained.  The LB [id number] endoscope was introduced through the mouth and advanced to the second portion of the duodenum. Without limitations.  The instrument was slowly withdrawn as the mucosa was fully examined.   ESOPHAGUS: A Schatzki ring was found 37 cm from the incisors and was widely open.   The esophagus was otherwise normal.  STOMACH: The mucosa of the stomach appeared normal.  Biopsies were taken in the antrum and angularis.  DUODENUM: The duodenal mucosa showed no abnormalities in the bulb and second portion of the duodenum.  Retroflexed views revealed no abnormalities.     The scope was then withdrawn from the patient and the procedure completed.  COMPLICATIONS: There were no complications.  ENDOSCOPIC IMPRESSION: 1.   Non-obstructing Schatzki ring was found 37 cm from the incisors 2.   The esophagus was otherwise normal. 3.   The mucosa of the stomach appeared normal; biopsies were taken in the antrum and angularis 4.   The duodenal mucosa showed no abnormalities in the bulb and second portion of the duodenum  RECOMMENDATIONS: 1.  Await pathology results 2.  Prevacid 30 mg daily 3.  Follow-up of helicobacter pylori status, treat if indicated  eSigned:  Beverley Fiedler, MD 11/13/2012 2:25 PM   UY:QIHK Phillips Odor, MD and The  Patient

## 2012-11-14 ENCOUNTER — Telehealth: Payer: Self-pay

## 2012-11-14 NOTE — Telephone Encounter (Signed)
  Follow up Call-  Call back number 11/13/2012  Post procedure Call Back phone  # [phone]  Permission to leave phone message Yes     Patient questions:  Do you have a fever, pain , or abdominal swelling? no Pain Score  0 *  Have you tolerated food without any problems? yes  Have you been able to return to your normal activities? yes  Do you have any questions about your discharge instructions: Diet   no Medications  no Follow up visit  no  Do you have questions or concerns about your Care? yes  Actions: * If pain score is 4 or above: No action needed, pain <4. The pt had questions about colon and egd reports.  I answered the questions and reminded her she needs a follow up appoint in 1 month to see Dr. Rhea Belton.  I reminded her to call to set the appoint up. Maw

## 2012-11-19 ENCOUNTER — Encounter: Payer: Self-pay | Admitting: Internal Medicine

## 2012-12-16 ENCOUNTER — Encounter: Payer: Self-pay | Admitting: Internal Medicine

## 2012-12-18 ENCOUNTER — Ambulatory Visit (INDEPENDENT_AMBULATORY_CARE_PROVIDER_SITE_OTHER): Payer: BC Managed Care – PPO | Admitting: Internal Medicine

## 2012-12-18 ENCOUNTER — Encounter: Payer: Self-pay | Admitting: Internal Medicine

## 2012-12-18 VITALS — BP 110/80 | HR 69 | Ht 66.0 in | Wt 244.0 lb

## 2012-12-18 DIAGNOSIS — Z86010 Personal history of colonic polyps: Secondary | ICD-10-CM

## 2012-12-18 DIAGNOSIS — K589 Irritable bowel syndrome without diarrhea: Secondary | ICD-10-CM

## 2012-12-18 DIAGNOSIS — K219 Gastro-esophageal reflux disease without esophagitis: Secondary | ICD-10-CM

## 2012-12-18 DIAGNOSIS — R1031 Right lower quadrant pain: Secondary | ICD-10-CM

## 2012-12-18 DIAGNOSIS — Z860101 Personal history of adenomatous and serrated colon polyps: Secondary | ICD-10-CM

## 2012-12-18 DIAGNOSIS — G8929 Other chronic pain: Secondary | ICD-10-CM | POA: Insufficient documentation

## 2012-12-18 MED ORDER — OMEPRAZOLE 40 MG PO CPDR: 40.0000 mg | DELAYED_RELEASE_CAPSULE | Freq: Every day | ORAL | Status: DC

## 2012-12-18 MED ORDER — RESTORA PO CAPS: 1.0000 | ORAL_CAPSULE | Freq: Every day | ORAL | Status: DC

## 2012-12-18 NOTE — Patient Instructions (Signed)
We have sent the following medications to your pharmacy for you to pick up at your convenience: Restora,    Follow up as needed                                               We are excited to introduce MyChart, a new best-in-class service that provides you online access to important information in your electronic medical record. We want to make it easier for you to view your health information - all in one secure location - when and where you need it. We expect MyChart will enhance the quality of care and service we provide.  When you register for MyChart, you can:    View your test results.    Request appointments and receive appointment reminders via email.    Request medication renewals.    View your medical history, allergies, medications and immunizations.    Communicate with your physician's office through a password-protected site.    Conveniently print information such as your medication lists.  To find out if MyChart is right for you, please talk to a member of our clinical staff today. We will gladly answer your questions about this free health and wellness tool.  If you are age 64 or older and want a member of your family to have access to your record, you must provide written consent by completing a proxy form available at our office. Please speak to our clinical staff about guidelines regarding accounts for patients younger than age 71.  As you activate your MyChart account and need any technical assistance, please call the MyChart technical support line at [phone]-CHART [phone]) or email your question to [email or web address]. If you email your question(s), please include your name, a return phone number and the best time to reach you.  If you have non-urgent health-related questions, you can send a message to our office through MyChart at [email or web address]. If you have a medical emergency, call 911.  Thank you for using MyChart as your new health and  wellness resource!   MyChart licensed from [employer],  [id number]. Patents Pending.

## 2012-12-18 NOTE — Progress Notes (Signed)
Subjective:    Patient ID: Norma [name], female    DOB: 09-Mar-1971, 42 y.o.   MRN: [id number]  HPI Norma [name] is a 42 yo female with PMH of hypertension, hyperlipidemia, anxiety and depression, GERD, and adenomatous colon polyps who is seen in followup. She was seen in August to evaluate right lower quadrant burning abdominal pain. After this appointment upper endoscopy and colonoscopy were performed on on 414. Upper endoscopy showed a nonobstructing Schatzki's ring and was otherwise unremarkable. Gastric biopsies were negative for H. pylori, dysplasia or malignancy. Colonoscopy to the terminal ileum revealed 3 subcentimeter adenomas in the right colon, and 2 hyperplastic polyps in the rectosigmoid colon. Examined terminal ileum was unremarkable. No explanation was found for her right sided abdominal pain. She was started on omeprazole 40 mg daily due to intermittent upper abdominal pain and coughing.  Today she is here alone. She reports that her cough and upper abdominal discomfort, which was intermittent as improved completely. She is very happy with omeprazole 40 mg daily. She has noticed loose stools which have been present since her colonoscopy. Previously she was having some constipation but now she is having 1-3 very soft bowel movements daily. Her bowel movements do not have form at present, she describes them as "female". She reports they're brown in color without blood or melena. She does report a flare in her hemorrhoids approximately 2 weeks ago. She reports 10 days of painful wiping and defecation. She used over-the-counter suppositories with relief of her symptoms. She does continue to have intermittent issues with right lower quadrant abdominal burning pain. This she reports is "very tolerable" and may be slightly better. Still no triggers for this pain.   Review of Systems As per history of present illness, otherwise negative  Current Medications, Allergies, Past Medical History, Past  Surgical History, Family History and Social History were reviewed in [employer] record.     Objective:   Physical Exam BP 110/80  Pulse 69  Ht 5\' 6"  (1.676 m)  Wt 244 lb (110.678 kg)  BMI 39.4 kg/m2  SpO2 98% Constitutional: Well-developed and well-nourished. No distress. HEENT: Normocephalic and atraumatic.   No scleral icterus. Cardiovascular: Normal rate, regular rhythm and intact distal pulses. No M/R/G Pulmonary/chest: Effort normal and breath sounds normal. No wheezing, rales or rhonchi. Abdominal: Soft, mild right lower quadrant burning pain with light palpation, nondistended. Bowel sounds active throughout. Extremities: no clubbing, cyanosis, or edema Neurological: Alert and oriented to person place and time. Skin: Skin is warm and dry. No rashes noted. Psychiatric: Normal mood and affect. Behavior is normal.     Assessment & Plan:  42 yo female with PMH of hypertension, hyperlipidemia, anxiety and depression, GERD, and adenomatous colon polyps who is seen in followup  1.  RLQ burning pain -- no etiology has been discovered for her right lower quadrant burning pain. There was no right colon or terminal ileum pathology to explain his pain. Previous CT scan of the abdomen and pelvis performed in 2012 also was unrevealing in this regard. Perhaps it is neuropathic in nature. At this point she does not feel it warrants further evaluation because it is not bothering her on a significant level at present. I've asked her to let me know if it becomes more painful or problematic for her and she voices understanding.  2.  GERD/cough -- improvement with omeprazole 40 mg daily. We'll continue with omeprazole 40 mg daily, see #3  3.  Loose stools -- this  may be secondary to initiation of omeprazole. At present she is happy with the result of PPI and is not overly troubled by the looseness of her stools. I will add a probiotic, Restora, 1 capsule daily to try to provide  more formed her stool. If this fails to provide benefit, I have asked that she notify me, and we could try a different PPI medication. She is happy with this plan  4.  Hx of adenomatous colon polyps -- 3 subcentimeter tubular adenomas on initial colonoscopy last month. I've recommended repeat screening/surveillance colonoscopy in 3 years. She is aware of this recommendation  Followup as needed

## 2013-03-31 ENCOUNTER — Ambulatory Visit (HOSPITAL_COMMUNITY)
Admission: RE | Admit: 2013-03-31 | Discharge: 2013-03-31 | Disposition: A | Discharge status: Home / Self Care | Payer: Medicaid Other | Source: Ambulatory Visit | Attending: Family Medicine | Admitting: Family Medicine

## 2013-03-31 ENCOUNTER — Other Ambulatory Visit (HOSPITAL_COMMUNITY): Payer: Self-pay | Admitting: Family Medicine

## 2013-03-31 DIAGNOSIS — M25559 Pain in unspecified hip: Secondary | ICD-10-CM

## 2013-03-31 DIAGNOSIS — M461 Sacroiliitis, not elsewhere classified: Secondary | ICD-10-CM

## 2013-03-31 DIAGNOSIS — N949 Unspecified condition associated with female genital organs and menstrual cycle: Secondary | ICD-10-CM | POA: Insufficient documentation

## 2013-05-06 ENCOUNTER — Ambulatory Visit: Payer: Medicaid Other | Attending: Orthopedic Surgery | Admitting: Physical Therapy

## 2013-05-06 DIAGNOSIS — M25559 Pain in unspecified hip: Secondary | ICD-10-CM | POA: Insufficient documentation

## 2013-05-06 DIAGNOSIS — M25569 Pain in unspecified knee: Secondary | ICD-10-CM | POA: Insufficient documentation

## 2013-05-06 DIAGNOSIS — IMO0001 Reserved for inherently not codable concepts without codable children: Secondary | ICD-10-CM | POA: Insufficient documentation

## 2013-05-08 ENCOUNTER — Other Ambulatory Visit: Payer: Self-pay | Admitting: Orthopedic Surgery

## 2013-05-08 DIAGNOSIS — M545 Low back pain, unspecified: Secondary | ICD-10-CM

## 2013-05-16 ENCOUNTER — Ambulatory Visit
Admission: RE | Admit: 2013-05-16 | Discharge: 2013-05-16 | Disposition: A | Discharge status: Home / Self Care | Payer: Medicaid Other | Source: Ambulatory Visit | Attending: Orthopedic Surgery | Admitting: Orthopedic Surgery

## 2013-05-16 DIAGNOSIS — M545 Low back pain, unspecified: Secondary | ICD-10-CM

## 2013-06-11 ENCOUNTER — Other Ambulatory Visit: Payer: Self-pay | Admitting: Neurosurgery

## 2013-07-02 ENCOUNTER — Encounter (HOSPITAL_COMMUNITY): Payer: Self-pay

## 2013-07-09 ENCOUNTER — Encounter (HOSPITAL_COMMUNITY): Payer: Self-pay

## 2013-07-09 ENCOUNTER — Encounter (HOSPITAL_COMMUNITY)
Admission: RE | Admit: 2013-07-09 | Discharge: 2013-07-09 | Disposition: A | Discharge status: Home / Self Care | Payer: Medicaid Other | Source: Ambulatory Visit | Attending: Anesthesiology | Admitting: Anesthesiology

## 2013-07-09 ENCOUNTER — Encounter (HOSPITAL_COMMUNITY)
Admission: RE | Admit: 2013-07-09 | Discharge: 2013-07-09 | Disposition: A | Discharge status: Home / Self Care | Payer: Medicaid Other | Source: Ambulatory Visit | Attending: Neurosurgery | Admitting: Neurosurgery

## 2013-07-09 DIAGNOSIS — Z01810 Encounter for preprocedural cardiovascular examination: Secondary | ICD-10-CM | POA: Insufficient documentation

## 2013-07-09 DIAGNOSIS — M5126 Other intervertebral disc displacement, lumbar region: Secondary | ICD-10-CM | POA: Insufficient documentation

## 2013-07-09 DIAGNOSIS — Z01818 Encounter for other preprocedural examination: Secondary | ICD-10-CM | POA: Insufficient documentation

## 2013-07-09 DIAGNOSIS — Z01812 Encounter for preprocedural laboratory examination: Secondary | ICD-10-CM | POA: Insufficient documentation

## 2013-07-09 LAB — CBC
HCT: 40.5 % (ref 36.0–46.0)
Hemoglobin: 13.4 g/dL (ref 12.0–15.0)
MCH: 28.9 pg (ref 26.0–34.0)
MCHC: 33.1 g/dL (ref 30.0–36.0)
MCV: 87.5 fL (ref 78.0–100.0)
Platelets: 234 10*3/uL (ref 150–400)
RBC: 4.63 MIL/uL (ref 3.87–5.11)
RDW: 14.4 % (ref 11.5–15.5)
WBC: 4.9 10*3/uL (ref 4.0–10.5)

## 2013-07-09 LAB — SURGICAL PCR SCREEN
MRSA, PCR: NEGATIVE
Staphylococcus aureus: NEGATIVE

## 2013-07-09 LAB — BASIC METABOLIC PANEL
BUN: 14 mg/dL (ref 6–23)
CO2: 25 mEq/L (ref 19–32)
Calcium: 9.4 mg/dL (ref 8.4–10.5)
Chloride: 99 mEq/L (ref 96–112)
Creatinine, Ser: 0.97 mg/dL (ref 0.50–1.10)
GFR calc Af Amer: 81 mL/min — ABNORMAL LOW (ref >90)
GFR calc non Af Amer: 70 mL/min — ABNORMAL LOW (ref >90)
Glucose, Bld: 87 mg/dL (ref 70–99)
Potassium: 4.5 mEq/L (ref 3.7–5.3)
Sodium: 138 mEq/L (ref 137–147)

## 2013-07-16 MED ORDER — DEXAMETHASONE SODIUM PHOSPHATE 10 MG/ML IJ SOLN
10.0000 mg | INTRAMUSCULAR | Status: AC
  Administered 2013-07-17: 10 mg via INTRAVENOUS
  Filled 2013-07-16: qty 1

## 2013-07-16 MED ORDER — CEFAZOLIN SODIUM-DEXTROSE 2-3 GM-% IV SOLR
2.0000 g | INTRAVENOUS | Status: AC
  Administered 2013-07-17: 2 g via INTRAVENOUS
  Filled 2013-07-16: qty 50

## 2013-07-17 ENCOUNTER — Encounter (HOSPITAL_COMMUNITY)
Admission: RE | Disposition: A | Discharge status: Home / Self Care | Payer: Self-pay | Source: Ambulatory Visit | Attending: Neurosurgery

## 2013-07-17 ENCOUNTER — Ambulatory Visit (HOSPITAL_COMMUNITY)
Admission: RE | Admit: 2013-07-17 | Discharge: 2013-07-17 | Disposition: A | Discharge status: Home / Self Care | Payer: Medicaid Other | Source: Ambulatory Visit | Attending: Neurosurgery | Admitting: Neurosurgery

## 2013-07-17 ENCOUNTER — Ambulatory Visit (HOSPITAL_COMMUNITY): Payer: Medicaid Other | Admitting: Certified Registered Nurse Anesthetist

## 2013-07-17 ENCOUNTER — Ambulatory Visit (HOSPITAL_COMMUNITY): Payer: Medicaid Other

## 2013-07-17 ENCOUNTER — Encounter (HOSPITAL_COMMUNITY): Payer: Self-pay | Admitting: Certified Registered Nurse Anesthetist

## 2013-07-17 ENCOUNTER — Encounter (HOSPITAL_COMMUNITY): Payer: Medicaid Other | Admitting: Certified Registered Nurse Anesthetist

## 2013-07-17 DIAGNOSIS — I10 Essential (primary) hypertension: Secondary | ICD-10-CM | POA: Insufficient documentation

## 2013-07-17 DIAGNOSIS — F3289 Other specified depressive episodes: Secondary | ICD-10-CM | POA: Insufficient documentation

## 2013-07-17 DIAGNOSIS — F329 Major depressive disorder, single episode, unspecified: Secondary | ICD-10-CM | POA: Insufficient documentation

## 2013-07-17 DIAGNOSIS — E669 Obesity, unspecified: Secondary | ICD-10-CM | POA: Insufficient documentation

## 2013-07-17 DIAGNOSIS — Z860100 Personal history of colon polyps, unspecified: Secondary | ICD-10-CM | POA: Insufficient documentation

## 2013-07-17 DIAGNOSIS — D649 Anemia, unspecified: Secondary | ICD-10-CM | POA: Insufficient documentation

## 2013-07-17 DIAGNOSIS — E785 Hyperlipidemia, unspecified: Secondary | ICD-10-CM | POA: Insufficient documentation

## 2013-07-17 DIAGNOSIS — M5126 Other intervertebral disc displacement, lumbar region: Secondary | ICD-10-CM | POA: Insufficient documentation

## 2013-07-17 DIAGNOSIS — Z79899 Other long term (current) drug therapy: Secondary | ICD-10-CM | POA: Insufficient documentation

## 2013-07-17 DIAGNOSIS — F411 Generalized anxiety disorder: Secondary | ICD-10-CM | POA: Insufficient documentation

## 2013-07-17 DIAGNOSIS — K219 Gastro-esophageal reflux disease without esophagitis: Secondary | ICD-10-CM | POA: Insufficient documentation

## 2013-07-17 HISTORY — PX: LUMBAR LAMINECTOMY/DECOMPRESSION MICRODISCECTOMY: SHX5026

## 2013-07-17 SURGERY — LUMBAR LAMINECTOMY/DECOMPRESSION MICRODISCECTOMY 1 LEVEL
Anesthesia: General | Site: Spine Lumbar | Laterality: Right

## 2013-07-17 MED ORDER — STERILE WATER FOR INJECTION IJ SOLN
INTRAMUSCULAR | Status: AC
  Filled 2013-07-17: qty 10

## 2013-07-17 MED ORDER — ACETAMINOPHEN 650 MG RE SUPP: 650.0000 mg | RECTAL | Status: DC | PRN

## 2013-07-17 MED ORDER — SODIUM CHLORIDE 0.9 % IR SOLN
Status: DC | PRN
  Administered 2013-07-17: 09:00:00

## 2013-07-17 MED ORDER — PROPOFOL 10 MG/ML IV BOLUS
INTRAVENOUS | Status: DC | PRN
  Administered 2013-07-17: 50 mg via INTRAVENOUS
  Administered 2013-07-17: 150 mg via INTRAVENOUS

## 2013-07-17 MED ORDER — GLYCOPYRROLATE 0.2 MG/ML IJ SOLN
INTRAMUSCULAR | Status: DC | PRN
  Administered 2013-07-17: .8 mg via INTRAVENOUS

## 2013-07-17 MED ORDER — GLYCOPYRROLATE 0.2 MG/ML IJ SOLN
INTRAMUSCULAR | Status: AC
  Filled 2013-07-17: qty 4

## 2013-07-17 MED ORDER — ROCURONIUM BROMIDE 50 MG/5ML IV SOLN
INTRAVENOUS | Status: AC
  Filled 2013-07-17: qty 1

## 2013-07-17 MED ORDER — ACETAMINOPHEN 325 MG PO TABS: 650.0000 mg | ORAL_TABLET | ORAL | Status: DC | PRN

## 2013-07-17 MED ORDER — SODIUM CHLORIDE 0.9 % IJ SOLN: 3.0000 mL | INTRAMUSCULAR | Status: DC | PRN

## 2013-07-17 MED ORDER — LOSARTAN POTASSIUM 50 MG PO TABS
100.0000 mg | ORAL_TABLET | Freq: Every day | ORAL | Status: DC
  Filled 2013-07-17: qty 2

## 2013-07-17 MED ORDER — 0.9 % SODIUM CHLORIDE (POUR BTL) OPTIME
TOPICAL | Status: DC | PRN
  Administered 2013-07-17: 1000 mL

## 2013-07-17 MED ORDER — THROMBIN 5000 UNITS EX SOLR
CUTANEOUS | Status: DC | PRN
  Administered 2013-07-17 (×2): 5000 [IU] via TOPICAL

## 2013-07-17 MED ORDER — CYCLOBENZAPRINE HCL 10 MG PO TABS: 10.0000 mg | ORAL_TABLET | Freq: Three times a day (TID) | ORAL | Status: DC | PRN

## 2013-07-17 MED ORDER — ONDANSETRON HCL 4 MG/2ML IJ SOLN
4.0000 mg | INTRAMUSCULAR | Status: DC | PRN
  Administered 2013-07-17: 4 mg via INTRAVENOUS
  Filled 2013-07-17: qty 2

## 2013-07-17 MED ORDER — FENTANYL CITRATE 0.05 MG/ML IJ SOLN
INTRAMUSCULAR | Status: AC
  Filled 2013-07-17: qty 5

## 2013-07-17 MED ORDER — OXYCODONE-ACETAMINOPHEN 5-325 MG PO TABS: 1.0000 | ORAL_TABLET | ORAL | Status: DC | PRN

## 2013-07-17 MED ORDER — KETOROLAC TROMETHAMINE 30 MG/ML IJ SOLN
30.0000 mg | Freq: Four times a day (QID) | INTRAMUSCULAR | Status: DC
Start: 2013-07-17 — End: 2013-07-17
  Administered 2013-07-17: 30 mg via INTRAVENOUS
  Filled 2013-07-17 (×2): qty 1

## 2013-07-17 MED ORDER — HYDROMORPHONE HCL PF 1 MG/ML IJ SOLN: 1.0000 mg | INTRAMUSCULAR | Status: DC | PRN

## 2013-07-17 MED ORDER — MIDAZOLAM HCL 5 MG/5ML IJ SOLN
INTRAMUSCULAR | Status: DC | PRN
  Administered 2013-07-17: 2 mg via INTRAVENOUS

## 2013-07-17 MED ORDER — PHENOL 1.4 % MT LIQD
1.0000 | OROMUCOSAL | Status: DC | PRN
  Administered 2013-07-17: 1 via OROMUCOSAL
  Filled 2013-07-17: qty 177

## 2013-07-17 MED ORDER — SODIUM CHLORIDE 0.9 % IJ SOLN: 3.0000 mL | Freq: Two times a day (BID) | INTRAMUSCULAR | Status: DC

## 2013-07-17 MED ORDER — KETOROLAC TROMETHAMINE 30 MG/ML IJ SOLN
INTRAMUSCULAR | Status: DC | PRN
  Administered 2013-07-17: 30 mg via INTRAVENOUS

## 2013-07-17 MED ORDER — ROCURONIUM BROMIDE 100 MG/10ML IV SOLN
INTRAVENOUS | Status: DC | PRN
  Administered 2013-07-17: 50 mg via INTRAVENOUS

## 2013-07-17 MED ORDER — DEXAMETHASONE 4 MG PO TABS
4.0000 mg | ORAL_TABLET | Freq: Four times a day (QID) | ORAL | Status: DC
  Administered 2013-07-17: 4 mg via ORAL
  Filled 2013-07-17: qty 1

## 2013-07-17 MED ORDER — ONDANSETRON HCL 4 MG/2ML IJ SOLN
INTRAMUSCULAR | Status: DC | PRN
  Administered 2013-07-17: 4 mg via INTRAVENOUS

## 2013-07-17 MED ORDER — HEMOSTATIC AGENTS (NO CHARGE) OPTIME
TOPICAL | Status: DC | PRN
  Administered 2013-07-17: 1 via TOPICAL

## 2013-07-17 MED ORDER — KCL IN DEXTROSE-NACL 20-5-0.45 MEQ/L-%-% IV SOLN
80.0000 mL/h | INTRAVENOUS | Status: DC
  Filled 2013-07-17 (×2): qty 1000

## 2013-07-17 MED ORDER — LORATADINE 10 MG PO TABS
10.0000 mg | ORAL_TABLET | Freq: Every day | ORAL | Status: DC | PRN
  Filled 2013-07-17: qty 1

## 2013-07-17 MED ORDER — GLYCOPYRROLATE 0.2 MG/ML IJ SOLN
INTRAMUSCULAR | Status: AC
Start: 2013-07-17 — End: 2013-07-17
  Filled 2013-07-17: qty 1

## 2013-07-17 MED ORDER — LIDOCAINE HCL (CARDIAC) 20 MG/ML IV SOLN
INTRAVENOUS | Status: AC
  Filled 2013-07-17: qty 5

## 2013-07-17 MED ORDER — TRAMADOL HCL 50 MG PO TABS: 50.0000 mg | ORAL_TABLET | Freq: Four times a day (QID) | ORAL | Status: DC | PRN

## 2013-07-17 MED ORDER — PANTOPRAZOLE SODIUM 40 MG IV SOLR
40.0000 mg | Freq: Every day | INTRAVENOUS | Status: DC
  Filled 2013-07-17: qty 40

## 2013-07-17 MED ORDER — SUCCINYLCHOLINE CHLORIDE 20 MG/ML IJ SOLN
INTRAMUSCULAR | Status: DC | PRN
  Administered 2013-07-17: 100 mg via INTRAVENOUS

## 2013-07-17 MED ORDER — OXYCODONE HCL 5 MG PO TABS: 5.0000 mg | ORAL_TABLET | Freq: Once | ORAL | Status: DC | PRN

## 2013-07-17 MED ORDER — BUPIVACAINE HCL (PF) 0.5 % IJ SOLN
INTRAMUSCULAR | Status: DC | PRN
  Administered 2013-07-17: 20 mL

## 2013-07-17 MED ORDER — FENTANYL CITRATE 0.05 MG/ML IJ SOLN
INTRAMUSCULAR | Status: DC | PRN
  Administered 2013-07-17: 100 ug via INTRAVENOUS
  Administered 2013-07-17 (×2): 50 ug via INTRAVENOUS
  Administered 2013-07-17: 150 ug via INTRAVENOUS

## 2013-07-17 MED ORDER — LACTATED RINGERS IV SOLN
INTRAVENOUS | Status: DC | PRN
  Administered 2013-07-17 (×2): via INTRAVENOUS

## 2013-07-17 MED ORDER — LIDOCAINE HCL (CARDIAC) 20 MG/ML IV SOLN
INTRAVENOUS | Status: DC | PRN
  Administered 2013-07-17: 60 mg via INTRAVENOUS

## 2013-07-17 MED ORDER — SODIUM CHLORIDE 0.9 % IV SOLN: 250.0000 mL | INTRAVENOUS | Status: DC

## 2013-07-17 MED ORDER — BUPROPION HCL ER (XL) 150 MG PO TB24: 150.0000 mg | ORAL_TABLET | Freq: Every day | ORAL | Status: DC

## 2013-07-17 MED ORDER — MENTHOL 3 MG MT LOZG
1.0000 | LOZENGE | OROMUCOSAL | Status: DC | PRN
Start: 2013-07-17 — End: 2013-07-17
  Filled 2013-07-17: qty 9

## 2013-07-17 MED ORDER — PHENYLEPHRINE 40 MCG/ML (10ML) SYRINGE FOR IV PUSH (FOR BLOOD PRESSURE SUPPORT)
PREFILLED_SYRINGE | INTRAVENOUS | Status: AC
  Filled 2013-07-17: qty 10

## 2013-07-17 MED ORDER — DEXAMETHASONE SODIUM PHOSPHATE 4 MG/ML IJ SOLN: 4.0000 mg | Freq: Four times a day (QID) | INTRAMUSCULAR | Status: DC

## 2013-07-17 MED ORDER — AMLODIPINE BESYLATE 5 MG PO TABS
5.0000 mg | ORAL_TABLET | Freq: Every day | ORAL | Status: DC
  Filled 2013-07-17: qty 1

## 2013-07-17 MED ORDER — ONDANSETRON HCL 4 MG/2ML IJ SOLN
INTRAMUSCULAR | Status: AC
  Filled 2013-07-17: qty 2

## 2013-07-17 MED ORDER — PROPOFOL 10 MG/ML IV BOLUS
INTRAVENOUS | Status: AC
  Filled 2013-07-17: qty 20

## 2013-07-17 MED ORDER — HYDROMORPHONE HCL PF 1 MG/ML IJ SOLN: 0.2500 mg | INTRAMUSCULAR | Status: DC | PRN

## 2013-07-17 MED ORDER — OXYCODONE HCL 5 MG/5ML PO SOLN: 5.0000 mg | Freq: Once | ORAL | Status: DC | PRN

## 2013-07-17 MED ORDER — NEOSTIGMINE METHYLSULFATE 10 MG/10ML IV SOLN
INTRAVENOUS | Status: DC | PRN
  Administered 2013-07-17: 5 mg via INTRAVENOUS

## 2013-07-17 MED ORDER — MIDAZOLAM HCL 2 MG/2ML IJ SOLN
INTRAMUSCULAR | Status: AC
  Filled 2013-07-17: qty 2

## 2013-07-17 MED ORDER — CEFAZOLIN SODIUM-DEXTROSE 2-3 GM-% IV SOLR
2.0000 g | Freq: Three times a day (TID) | INTRAVENOUS | Status: DC
  Filled 2013-07-17 (×2): qty 50

## 2013-07-17 MED ORDER — NEOSTIGMINE METHYLSULFATE 10 MG/10ML IV SOLN
INTRAVENOUS | Status: AC
  Filled 2013-07-17: qty 1

## 2013-07-17 SURGICAL SUPPLY — 56 items
ADH SKN CLS APL DERMABOND .7 (GAUZE/BANDAGES/DRESSINGS)
ADH SKN CLS LQ APL DERMABOND (GAUZE/BANDAGES/DRESSINGS) ×1
APL SKNCLS STERI-STRIP NONHPOA (GAUZE/BANDAGES/DRESSINGS) ×2
BAG DECANTER FOR FLEXI CONT (MISCELLANEOUS) ×3 IMPLANT
BENZOIN TINCTURE PRP APPL 2/3 (GAUZE/BANDAGES/DRESSINGS) ×6 IMPLANT
BLADE 10 SAFETY STRL DISP (BLADE) ×1 IMPLANT
BLADE SURG ROTATE 9660 (MISCELLANEOUS) ×1 IMPLANT
BRUSH SCRUB EZ PLAIN DRY (MISCELLANEOUS) ×3 IMPLANT
BUR CUTTER 7.0 ROUND (BURR) ×3 IMPLANT
BUR MATCHSTICK NEURO 3.0 LAGG (BURR) ×3 IMPLANT
CANISTER SUCT 3000ML (MISCELLANEOUS) ×3 IMPLANT
CLOSURE WOUND 1/2 X4 (GAUZE/BANDAGES/DRESSINGS) ×1
CONT SPEC 4OZ CLIKSEAL STRL BL (MISCELLANEOUS) ×3 IMPLANT
DERMABOND ADHESIVE PROPEN (GAUZE/BANDAGES/DRESSINGS) ×2
DERMABOND ADVANCED (GAUZE/BANDAGES/DRESSINGS)
DERMABOND ADVANCED .7 DNX12 (GAUZE/BANDAGES/DRESSINGS) ×1 IMPLANT
DERMABOND ADVANCED .7 DNX6 (GAUZE/BANDAGES/DRESSINGS) IMPLANT
DRAPE LAPAROTOMY 100X72X124 (DRAPES) ×3 IMPLANT
DRAPE MICROSCOPE LEICA (MISCELLANEOUS) ×2 IMPLANT
DRAPE MICROSCOPE ZEISS OPMI (DRAPES) ×1 IMPLANT
DRAPE SURG 17X23 STRL (DRAPES) ×6 IMPLANT
DRESSING TELFA 8X3 (GAUZE/BANDAGES/DRESSINGS) ×1 IMPLANT
DRSG OPSITE POSTOP 4X6 (GAUZE/BANDAGES/DRESSINGS) ×2 IMPLANT
DURAPREP 26ML APPLICATOR (WOUND CARE) ×3 IMPLANT
ELECT REM PT RETURN 9FT ADLT (ELECTROSURGICAL) ×3
ELECTRODE REM PT RTRN 9FT ADLT (ELECTROSURGICAL) ×1 IMPLANT
GAUZE SPONGE 4X4 16PLY XRAY LF (GAUZE/BANDAGES/DRESSINGS) IMPLANT
GLOVE BIOGEL PI IND STRL 7.0 (GLOVE) IMPLANT
GLOVE BIOGEL PI IND STRL 8.5 (GLOVE) IMPLANT
GLOVE BIOGEL PI INDICATOR 7.0 (GLOVE) ×4
GLOVE BIOGEL PI INDICATOR 8.5 (GLOVE) ×2
GLOVE ECLIPSE 8.0 STRL XLNG CF (GLOVE) ×5 IMPLANT
GLOVE SS BIOGEL STRL SZ 6.5 (GLOVE) IMPLANT
GLOVE SUPERSENSE BIOGEL SZ 6.5 (GLOVE) ×6
GOWN BRE IMP SLV AUR LG STRL (GOWN DISPOSABLE) ×2 IMPLANT
GOWN BRE IMP SLV AUR XL STRL (GOWN DISPOSABLE) ×3 IMPLANT
GOWN STRL REIN 2XL LVL4 (GOWN DISPOSABLE) ×2 IMPLANT
KIT BASIN OR (CUSTOM PROCEDURE TRAY) ×3 IMPLANT
KIT ROOM TURNOVER OR (KITS) ×3 IMPLANT
NDL SPNL 22GX3.5 QUINCKE BK (NEEDLE) ×2 IMPLANT
NEEDLE HYPO 22GX1.5 SAFETY (NEEDLE) ×3 IMPLANT
NEEDLE SPNL 22GX3.5 QUINCKE BK (NEEDLE) ×3 IMPLANT
NS IRRIG 1000ML POUR BTL (IV SOLUTION) ×3 IMPLANT
PACK LAMINECTOMY NEURO (CUSTOM PROCEDURE TRAY) ×3 IMPLANT
PAD ARMBOARD 7.5X6 YLW CONV (MISCELLANEOUS) ×13 IMPLANT
PATTIES SURGICAL .75X.75 (GAUZE/BANDAGES/DRESSINGS) ×1 IMPLANT
RUBBERBAND STERILE (MISCELLANEOUS) ×6 IMPLANT
SPONGE GAUZE 4X4 12PLY (GAUZE/BANDAGES/DRESSINGS) ×1 IMPLANT
SPONGE SURGIFOAM ABS GEL SZ50 (HEMOSTASIS) ×3 IMPLANT
STRIP CLOSURE SKIN 1/2X4 (GAUZE/BANDAGES/DRESSINGS) ×2 IMPLANT
SUT VIC AB 2-0 OS6 18 (SUTURE) ×9 IMPLANT
SUT VIC AB 3-0 CP2 18 (SUTURE) ×3 IMPLANT
SYR 20ML ECCENTRIC (SYRINGE) ×3 IMPLANT
TOWEL OR 17X24 6PK STRL BLUE (TOWEL DISPOSABLE) ×3 IMPLANT
TOWEL OR 17X26 10 PK STRL BLUE (TOWEL DISPOSABLE) ×3 IMPLANT
WATER STERILE IRR 1000ML POUR (IV SOLUTION) ×3 IMPLANT

## 2013-07-17 NOTE — Progress Notes (Signed)
Pt doing well. Pt given D/C instructions with Rx, verbal understanding of teaching was given. Pt's IV was removed prior to D/C. Pt D/C'd home via wheelchair @ 1715 per MD order. Holli Humbles, RN

## 2013-07-17 NOTE — Anesthesia Procedure Notes (Signed)
Procedure Name: Intubation Date/Time: 07/17/2013 8:01 AM Performed by: Raphael Gibney T Pre-anesthesia Checklist: Patient identified, Timeout performed, Emergency Drugs available, Suction available and Patient being monitored Patient Re-evaluated:Patient Re-evaluated prior to inductionOxygen Delivery Method: Circle system utilized and Simple face mask Preoxygenation: Pre-oxygenation with 100% oxygen Intubation Type: Combination inhalational/ intravenous induction Ventilation: Mask ventilation with difficulty, Two handed mask ventilation required and Oral airway inserted - appropriate to patient size Laryngoscope size: unable to view with mac2 and miller 2. Tube size: 7.5 mm Number of attempts: 2 Airway Equipment and Method: Patient positioned with wedge pillow,  Stylet and Video-laryngoscopy Placement Confirmation: ETT inserted through vocal cords under direct vision,  positive ETCO2 and breath sounds checked- equal and bilateral Secured at: 22 cm Tube secured with: Tape Dental Injury: Teeth and Oropharynx as per pre-operative assessment and Bloody posterior oropharynx  Difficulty Due To: Difficulty was anticipated, Difficult Airway- due to anterior larynx and Difficult Airway- due to limited oral opening Future Recommendations: Recommend- induction with short-acting agent, and alternative techniques readily available

## 2013-07-17 NOTE — H&P (Signed)
Norma [name] is an 43 y.o. female.   Chief Complaint: Right leg pain HPI: The patient is a 43 year old female who had back surgery 045 and the left and 2500 extremely well. She is buttock and right leg pain with spinal and on for about 6 months. Start off with foot numbness October the pain started January this years been getting steadily worse. His orthopedist and an MRI scan and arranged for her to have an epidural shot which gave her mild relief only but nothing that was sustained. She tried anti-inflammatory medication oral steroids without help. Within the also left leg was asymptomatic. Because was having done her previous surgery she came for evaluation. The films were reviewed which showed a large disc herniation L4-5 on the right. The orthopedist she had seen suggested a fusion but felt with her having done so after her last surgery with this being on the opposite side of her previous surgery, she did have a simple microdiscectomy rather than a fusion. She like that suggested and now comes for a lumbar microdiscectomy. I had a long discussion with her regarding the risks and benefits of surgical intervention. The risks discussed include but are not limited to bleeding infection weakness numbness paralysis spinal fluid leak coma and death. We have discussed alternative methods of therapy although risks and benefits of nonintervention. She's had the opportunity to ask numerous questions and appears to understand. With this information in hand she has requested that we proceed with surgery.  Past Medical History  Diagnosis Date  . Hypertension   . Hyperlipidemia   . Obesity   . GERD (gastroesophageal reflux disease)   . Depression   . Anxiety   . Anemia   . Chronic headache   . Hyperplastic colon polyp     Past Surgical History  Procedure Laterality Date  . Partial hysterectomy    . Bartholin gland cyst excision    . Back surgery      Family History  Problem Relation Age of Onset   . Colon polyps Maternal Uncle   . Diabetes Mother   . Diabetes Father   . Liver disease Maternal Grandmother    Social History:  reports that she has never smoked. She has never used smokeless tobacco. She reports that she does not drink alcohol or use illicit drugs.  Allergies: No Known Allergies  Medications Prior to Admission  Medication Sig Dispense Refill  . amLODipine (NORVASC) 5 MG tablet Take 5 mg by mouth at bedtime.      [name] Kitchen buPROPion (WELLBUTRIN XL) 150 MG 24 hr tablet Take 150 mg by mouth daily.      . cyclobenzaprine (FLEXERIL) 10 MG tablet Take 10 mg by mouth at bedtime.      [name] Kitchen HYDROcodone-acetaminophen (NORCO/VICODIN) 5-325 MG per tablet Take 1 tablet by mouth every 6 (six) hours as needed for moderate pain.      [name] Kitchen losartan (COZAAR) 100 MG tablet Take 100 mg by mouth daily.      . meloxicam (MOBIC) 15 MG tablet Take 15 mg by mouth daily.        [name] Kitchen omeprazole (PRILOSEC) 40 MG capsule Take 1 capsule (40 mg total) by mouth daily.  90 capsule  11  . polyethylene glycol (MIRALAX / GLYCOLAX) packet Take 17 g by mouth daily as needed for mild constipation.      . traMADol (ULTRAM) 50 MG tablet Take 50 mg by mouth every 6 (six) hours as needed for moderate pain.       [name] Kitchen  loratadine (CLARITIN) 10 MG tablet Take 10 mg by mouth daily as needed for allergies.        No results found for this or any previous visit (from the past 48 hour(s)). No results found.  Negative  Blood pressure 143/60, pulse 89, temperature 99.1 F (37.3 C), temperature source Oral, resp. rate 20, weight 98.521 kg (217 lb 3.2 oz), SpO2 100.00%.  The patient is awake or and oriented. She is no facial asymmetry. He is mildly antalgic. Reflexes are 1+ and equal. Strength is 5 over 5 except for some mild weakness of extensor pollicis longus on the right Assessment/Plan Impression is that of a herniated disc L4-5 on the right. The plan is for a right L4-5 microdiscectomy.  Faythe Ghee, MD 07/17/2013, 7:38 AM

## 2013-07-17 NOTE — Transfer of Care (Signed)
Immediate Anesthesia Transfer of Care Note  Patient: Norma [name]  Procedure(s) Performed: Procedure(s) with comments: LUMBAR LAMINECTOMY/DECOMPRESSION MICRODISCECTOMY 1 LEVEL,RIGHT LUMBAR FOUR-FIVE (Right) - Right  Patient Location: PACU  Anesthesia Type:General  Level of Consciousness: awake and alert   Airway & Oxygen Therapy: Patient Spontanous Breathing and Patient connected to face mask oxygen  Post-op Assessment: Report given to PACU RN, Post -op Vital signs reviewed and stable and Patient moving all extremities X 4  Post vital signs: Reviewed and stable  Complications: No apparent anesthesia complications

## 2013-07-17 NOTE — Discharge Summary (Signed)
  Physician Discharge Summary  Patient ID: Norma [name] MRN: [id number] DOB/AGE: 1970/08/24 43 y.o.  Admit date: 07/17/2013 Discharge date: 07/17/2013  Admission Diagnoses:  Discharge Diagnoses:  Active Problems:   Lumbar disc herniation   Discharged Condition: good  Hospital Course: surgery in am for HNP L45. Did great. No pain post op. Ambulated well. Wound clean and dry. Home same day, specific instructions given.  Consults: None  Significant Diagnostic Studies: none  Treatments: surgery: right L 45 microdiscectomy  Discharge Exam: Blood pressure 99/68, pulse 80, temperature 97.2 F (36.2 C), temperature source Oral, resp. rate 16, weight 98.521 kg (217 lb 3.2 oz), SpO2 95.00%. Incision/Wound:clean and dry; no new neuro issues  Disposition:      Medication List    ASK your doctor about these medications       amLODipine 5 MG tablet  Commonly known as:  NORVASC  Take 5 mg by mouth at bedtime.     buPROPion 150 MG 24 hr tablet  Commonly known as:  WELLBUTRIN XL  Take 150 mg by mouth daily.     cyclobenzaprine 10 MG tablet  Commonly known as:  FLEXERIL  Take 10 mg by mouth at bedtime.     HYDROcodone-acetaminophen 5-325 MG per tablet  Commonly known as:  NORCO/VICODIN  Take 1 tablet by mouth every 6 (six) hours as needed for moderate pain.     loratadine 10 MG tablet  Commonly known as:  CLARITIN  Take 10 mg by mouth daily as needed for allergies.     losartan 100 MG tablet  Commonly known as:  COZAAR  Take 100 mg by mouth daily.     meloxicam 15 MG tablet  Commonly known as:  MOBIC  Take 15 mg by mouth daily.     omeprazole 40 MG capsule  Commonly known as:  PRILOSEC  Take 1 capsule (40 mg total) by mouth daily.     polyethylene glycol packet  Commonly known as:  MIRALAX / GLYCOLAX  Take 17 g by mouth daily as needed for mild constipation.     traMADol 50 MG tablet  Commonly known as:  ULTRAM  Take 50 mg by mouth every 6 (six) hours as needed  for moderate pain.         At home rest most of the time. Get up 9 or 10 times each day and take a 15 or 20 minute walk. No riding in the car and to your first postoperative appointment. If you have neck surgery you may shower from the chest down starting on the third postoperative day. If you had back surgery he may start showering on the third postoperative day with saran wrap wrapped around your incisional area 3 times. After the shower remove the saran wrap. Take pain medicine as needed and other medications as instructed. Call my office for an appointment.  Signed: Faythe Ghee, MD 07/17/2013, 4:26 PM

## 2013-07-17 NOTE — Anesthesia Preprocedure Evaluation (Signed)
Anesthesia Evaluation  Patient identified by MRN, date of birth, ID band Patient awake    Reviewed: Allergy & Precautions, H&P , NPO status , Patient's Chart, lab work & pertinent test results  Airway Mallampati: III TM Distance: >3 FB Neck ROM: Full    Dental no notable dental hx. (+) Teeth Intact, Dental Advisory Given   Pulmonary neg pulmonary ROS,  breath sounds clear to auscultation  Pulmonary exam normal       Cardiovascular hypertension, Pt. on medications Rhythm:Regular Rate:Normal     Neuro/Psych  Headaches, Anxiety Depression    GI/Hepatic Neg liver ROS, GERD-  Medicated and Controlled,  Endo/Other  negative endocrine ROS  Renal/GU negative Renal ROS  negative genitourinary   Musculoskeletal   Abdominal   Peds  Hematology negative hematology ROS (+)   Anesthesia Other Findings   Reproductive/Obstetrics negative OB ROS                           Anesthesia Physical Anesthesia Plan  ASA: II  Anesthesia Plan: General   Post-op Pain Management:    Induction: Intravenous  Airway Management Planned: Oral ETT  Additional Equipment:   Intra-op Plan:   Post-operative Plan: Extubation in OR  Informed Consent: I have reviewed the patients History and Physical, chart, labs and discussed the procedure including the risks, benefits and alternatives for the proposed anesthesia with the patient or authorized representative who has indicated his/her understanding and acceptance.   Dental advisory given  Plan Discussed with: CRNA  Anesthesia Plan Comments:         Anesthesia Quick Evaluation

## 2013-07-17 NOTE — Op Note (Signed)
Preop diagnosis: Herniated disc L4-5 right Postop diagnosis: Same Procedure: Right L4-5 intralaminar laminotomy for excision of herniated disc with the operative microscope Surgeon: Tennyson Kallen Assistant: Elsner  After and placed the prone position the patient's back was prepped and draped in usual sterile fashion. Localizing x-rays taken prior to incision to identify the appropriate level. Midline incision was made above the spinous processes of L4 and L5. Using Bovie cutting current the incision was carried on the spinous processes. Subperiosteal dissection was then carried out on the right 7 spinous processes and lamina and self-retaining tract was placed for exposure. X-ray showed approach to L3-4 we dissected one level further were placed a self-retaining retractor. We then performed a generous laminotomy by removing the inferior one third of the L4 lamina the medial one third of the facet joint the superior one third of the L5 lamina. Residual bone and ligamentum flavum removed in a piecemeal fashion. The microscope was draped brought in the field and used for the remainder of the case. Using microdissection technique the lateral aspect of the thecal sac and L5 nerve were identified. Further coagulation was carried out down before the canal to identify the L4-5 disc which is an be tremendously herniated. After coagulating on the annulus the S. was incised a 15 blade. Using pituitary rongeurs and curettes a thorough displaced cleanout was carried out while the same time great care was taken to avoid injury to the neural elements and this was successfully done. At this time inspection was carried out in all directions for any evidence of residual compression and none could be identified. Irrigation was carried out and any bleeding control proper coagulation Gelfoam. The was then closed in multiple layers of Vicryl on the muscle fascia subcutaneous and subcuticular tissues. Dermabond Steri-Strips were placed on  the skin. A sterile dressing was then applied the patient was extubated and taken to recovery room in stable condition.

## 2013-07-17 NOTE — Anesthesia Postprocedure Evaluation (Signed)
  Anesthesia Post-op Note  Patient: Norma [name]  Procedure(s) Performed: Procedure(s) with comments: LUMBAR LAMINECTOMY/DECOMPRESSION MICRODISCECTOMY 1 LEVEL,RIGHT LUMBAR FOUR-FIVE (Right) - Right  Patient Location: PACU  Anesthesia Type:General  Level of Consciousness: awake and alert   Airway and Oxygen Therapy: Patient Spontanous Breathing  Post-op Pain: none  Post-op Assessment: Post-op Vital signs reviewed, Patient's Cardiovascular Status Stable and Respiratory Function Stable  Post-op Vital Signs: Reviewed  Filed Vitals:   07/17/13 1023  BP:   Pulse: 88  Temp: 36.1 C  Resp: 22    Complications: No apparent anesthesia complications

## 2013-07-17 NOTE — Discharge Instructions (Signed)
Laminectomy - Laminotomy - Discectomy Your surgeon has decided that a laminectomy (entire lamina removal) or laminotomy (partial lamina removal) is the best treatment for your back problem. These procedures involve removal of bone to relieve pressure on nerve roots. It allows the surgeon access to parts of the spine where other problems are located. This could be an injured disc (the cartilage-like structures located between the bones of the back). In this surgery your surgeon removes a part of the boney arch that surrounds your spinal canal. This may be compressing nerve roots. In some cases, the surgeon will remove the disc and fuse (stick together) vertebral bodies (the bones of your back) to make the spine more stable. The type of procedure you will need is usually decided prior to surgery, however modifications may be necessary. The time in surgery depends on the findings in surgery and the procedure necessary to correct the problems. DISCECTOMY For people with disc problems, the surgeon removes the portion of the disc that is causing the pressure on the nerve root. Some surgeons perform a micro (small) discectomy, which may require removal of only a small portion of the lamina. A disc nucleus (center) may also be removed either through a needle (percutaneous discectomy) or by injecting an enzyme called chymopapain into the disc. Chymopapain is an enzyme that dissolves the disc. For people with back instability, the surgeon fuses vertebrae that are next to each other with tiny pieces of bone. These are used as bone grafts on the facets, or between the vertebrae. When this heals, the bones will no longer be able to move. These bone chips are often taken from the pelvic bones. Bones and bone grafts grow into one unit, stabilizing the segments of the spinal column. LET YOUR CAREGIVER KNOW ABOUT:  Allergies.  Medicines taken including herbs, eyedrops, over-the-counter medicines, and creams.  Use of  steroids (by mouth or creams).  Previous problems with anesthetics or numbing medicine.  Possibility of pregnancy, if this applies.  History of blood clots (thrombophlebitis).  History of bleeding or blood problems.  Previous surgery.  Other health problems. RISKS AND COMPLICATIONS Your caregiver will discuss possible risks and complications with you before surgery. In addition to the usual risks of anesthesia, other common risks and complications include:  Blood loss and replacement.  Temporary increase in pain due to surgery.  Uncorrected back pain.  Infection.  New nerve damage (tingling, numbness, and pain). BEFORE THE PROCEDURE  Stop smoking at least 1 week prior to surgery. This lowers risk during surgery.  Your caregiver may advise that you stop taking certain medicines that may affect the outcome of the surgery and your ability to heal. For example, you may need to stop taking anti-inflammatories, such as aspirin, because of possible bleeding problems. Other medicines may have interactions with anesthesia.  Tell your caregiver if you have been on steroids for long periods of time. Often, additional steroids are administered intravenously before and during the procedure to prevent complications.  You should be present 60 minutes prior to your procedure or as directed. AFTER THE PROCEDURE After surgery, you will be taken to the recovery area where a nurse will watch and check your progress. Generally, you will be allowed to go home within 1 week barring other problems. HOME CARE INSTRUCTIONS   Check the surgical cut (incision) twice a day for signs of infection. Some signs may include a bad smelling, greenish or yellowish discharge from the wound; increased pain or increased redness over the  incision site; an opening of the incision; flu-like symptoms; or a temperature above 101.5 F (38.6 C).  Change your bandages in about 24 to 36 hours following surgery or as  directed.  You may shower once the bandage is removed or as directed. Avoid bathtubs, swimming pools, and hot tubs for 3 weeks or until your incision has healed completely. If you have stitches (sutures) or staples they may be removed 2 to 3 weeks after surgery, or as directed by your caregiver.  Follow your caregiver's instructions for activities, exercises, and physical therapy.  Weight reduction may be beneficial if you are overweight.  Walking is permitted. You may use a treadmill without an incline. Cut down on activities if you have discomfort. You may also go up and down stairs as tolerated.  Do not lift anything heavier than 10 to 15 pounds. Avoid bending or twisting at the waist. Always bend your knees.  Maintain strength and range of motion as instructed.  No driving is permitted for 2 to 3 weeks, or as directed by your caregiver. You may be a passenger for 20 to 30 minute trips. Lying back in the passenger seat may be more comfortable for you.  Limit your sitting to 20 to 30 minute intervals. You should lie down or walk in between sitting periods. There are no limitations for sitting in a recliner chair.  Only take over-the-counter or prescription medicines for pain, discomfort, or fever as directed by your caregiver. SEEK MEDICAL CARE IF:   There is increased bleeding (more than a small spot) from the wound.  You notice redness, swelling, or increasing pain in the wound.  Pus is coming from the wound.  An unexplained oral temperature above 102 F (38.9 C) develops.  You notice a bad smell coming from the wound or dressing. SEEK IMMEDIATE MEDICAL CARE IF:   You develop a rash.  You have difficulty breathing.  You have any allergic problems. Document Released: 02/24/2000 Document Revised: 05/21/2011 Document Reviewed: 12/23/2007 [facility] Patient Information 2014 [place].  Wound Care Keep incision covered and dry for one week. You may shower from the neck  down. You may remove outer bandage after one week.  Do not put any creams, lotions, or ointments on incision. Leave steri-strips on neck.  They will fall off by themselves. Activity Walk each and every day, increasing distance each day. No lifting greater than 5 lbs.  Avoid bending, arching, and twisting. No driving or riding in car until further notice at follow up appointment. If provided with back brace, wear when out of bed.  It is not necessary to wear brace in bed. Diet Resume your normal diet.  Return to Work Will be discussed at you follow up appointment. Call Your Doctor If Any of These Occur Redness, drainage, or swelling at the wound.  Temperature greater than 101 degrees. Severe pain not relieved by pain medication. Incision starts to come apart. Follow Up Appt Call today for appointment in 1-2 weeks ([phone]) or for problems.  If you have any hardware placed in your spine, you will need an x-ray before your appointment.

## 2013-07-21 ENCOUNTER — Encounter (HOSPITAL_COMMUNITY): Payer: Self-pay | Admitting: Neurosurgery

## 2013-08-14 ENCOUNTER — Telehealth: Payer: Self-pay | Admitting: Internal Medicine

## 2013-08-14 NOTE — Telephone Encounter (Signed)
Patient has lost 45 lbs and would like to try and come off some of her medications.  She will try stopping her Prilosec.  She will resume if her symptoms return.  She will call back for any GI concerns

## 2013-09-04 NOTE — OR Nursing (Signed)
Addendum to scope page 

## 2013-11-09 ENCOUNTER — Encounter: Payer: Self-pay | Admitting: *Deleted

## 2013-11-09 ENCOUNTER — Telehealth: Payer: Self-pay | Admitting: Internal Medicine

## 2013-11-09 NOTE — Telephone Encounter (Signed)
Pt states she has been having right sided abdominal pain over the weekend and that she was supposed to call and be seen when the pain flared up again. Pt scheduled to see Alonza Bogus PA tomorrow at 10am. Pt aware of appt.

## 2013-11-10 ENCOUNTER — Ambulatory Visit (INDEPENDENT_AMBULATORY_CARE_PROVIDER_SITE_OTHER): Payer: PRIVATE HEALTH INSURANCE | Admitting: Gastroenterology

## 2013-11-10 ENCOUNTER — Encounter: Payer: Self-pay | Admitting: Gastroenterology

## 2013-11-10 VITALS — BP 128/90 | HR 76 | Ht 66.0 in | Wt 221.2 lb

## 2013-11-10 DIAGNOSIS — R1011 Right upper quadrant pain: Secondary | ICD-10-CM

## 2013-11-10 MED ORDER — HYOSCYAMINE SULFATE 0.125 MG SL SUBL: 0.1250 mg | SUBLINGUAL_TABLET | SUBLINGUAL | Status: DC | PRN

## 2013-11-10 NOTE — Patient Instructions (Signed)
We have sent the following medications to your pharmacy for you to pick up at your convenience: Levsin  ____________________________________________________________________________________________________________________________________________________________________________________ You have been scheduled for an abdominal ultrasound at [facility] (1st floor of hospital) on 11-12-2013 at 8 am. Please arrive 15 minutes prior to your appointment for registration. Make certain not to have anything to eat or drink 6 hours prior to your appointment. Should you need to reschedule your appointment, please contact radiology at [phone]. This test typically takes about 30 minutes to perform.  ___________________________________________________________________________________________________________________________________________________________________________________  Dennis Bast have been scheduled for a HIDA scan at [facility] (1st floor) on 11-18-2013. Please arrive 15 minutes prior to your scheduled appointment at  419 am. Make certain not to have anything to eat or drink at least 6 hours prior to your test. Should this appointment date or time not work well for you, please call radiology scheduling at [phone].  _____________________________________________________________________ hepatobiliary (HIDA) scan is an imaging procedure used to diagnose problems in the liver, gallbladder and bile ducts. In the HIDA scan, a radioactive chemical or tracer is injected into a vein in your arm. The tracer is handled by the liver like bile. Bile is a fluid produced and excreted by your liver that helps your digestive system break down fats in the foods you eat. Bile is stored in your gallbladder and the gallbladder releases the bile when you eat a meal. A special nuclear medicine scanner (gamma camera) tracks the flow of the tracer from your liver into your gallbladder and small intestine.  During  your HIDA scan  You'll be asked to change into a hospital gown before your HIDA scan begins. Your health care team will position you on a table, usually on your back. The radioactive tracer is then injected into a vein in your arm.The tracer travels through your bloodstream to your liver, where it's taken up by the bile-producing cells. The radioactive tracer travels with the bile from your liver into your gallbladder and through your bile ducts to your small intestine.You may feel some pressure while the radioactive tracer is injected into your vein. As you lie on the table, a special gamma camera is positioned over your abdomen taking pictures of the tracer as it moves through your body. The gamma camera takes pictures continually for about an hour. You'll need to keep still during the HIDA scan. This can become uncomfortable, but you may find that you can lessen the discomfort by taking deep breaths and thinking about other things. Tell your health care team if you're uncomfortable. The radiologist will watch on a computer the progress of the radioactive tracer through your body. The HIDA scan may be stopped when the radioactive tracer is seen in the gallbladder and enters your small intestine. This typically takes about an hour. In some cases extra imaging will be performed if original images aren't satisfactory, if morphine is given to help visualize the gallbladder or if the medication CCK is given to look at the contraction of the gallbladder. This test typically takes 2 hours to complete. ________________________________________________________________________

## 2013-11-10 NOTE — Progress Notes (Addendum)
     11/10/2013 Norma [name] [id number] [birth year]   History of Present Illness:  This is a pleasant 43 year old female who is previously known to Dr. Hilarie Fredrickson.  She had an EGD in September 2014 at which time she was found to have a nonobstructing Schatzki's ring. Biopsies of the stomach showed minimal chronic inflammation, negative for H. pylori. She also had a colonoscopy in September of 2014 at which time she was found to have 5 polyps which were removed; some were tubular adenomas and some were hyperplastic polyps. She presents to our office today with complaints of right upper quadrant abdominal pain.  The pain is up high under the right side of her rib-cage and she does complain of pain into the right side of her back as well.  She reports that this is the same pain that she's had intermittently in the past. This episode began last Thursday evening, August 27. The pain does come and go and does seem to be worse within a couple of hours of eating. It has been waking her up at night. Her only other complaint is of some episodic diarrhea. She states that she will have a normal bowel movement and then within a couple of hours she will have an episode of loose stools. She also noticed some heartburn/indigestion this past Friday and Saturday as well. She has tried a heating pad to that area which has helped to some degree. She does take omeprazole 40 mg daily, but usually does not have any other problems with heartburn or reflux.   Current Medications, Allergies, Past Medical History, Past Surgical History, Family History and Social History were reviewed in [employer] record.   Physical Exam: BP 128/90  Pulse 76  Ht 5\' 6"  (1.676 m)  Wt 221 lb 3.2 oz (100.336 kg)  BMI 35.72 kg/m2 General: Well developed black female in no acute distress Head: Normocephalic and atraumatic Eyes:  Sclerae anicteric, conjunctiva pink  Ears: Normal auditory acuity Lungs: Clear throughout to  auscultation Heart: Regular rate and rhythm Abdomen: Soft, non-distended.  Normal bowel sounds.  Minimal RUQ TTP without R/R/G. Musculoskeletal: Symmetrical with no gross deformities  Extremities: No edema  Neurological: Alert oriented x 4, grossly non-focal Psychological:  Alert and cooperative. Normal mood and affect  Assessment and Recommendations: -RUQ abdominal pain:  Intermittent.  No pain causing etiology found on EGD and colonoscopy 11/2012.  ? Biliary pathology such as dyskinesia or chronic cholecystitis.  Will schedule ultrasound first to rule out gallstones, etc and will schedule HIDA scan as well.  In the interim she would like to try some Levsin, which she was given to use last year as well.  Will continue heating pad, which has helped to some degree.    Addendum: Reviewed and agree with initial management. Jerene Bears, MD

## 2013-11-12 ENCOUNTER — Ambulatory Visit (HOSPITAL_COMMUNITY)
Admission: RE | Admit: 2013-11-12 | Discharge: 2013-11-12 | Disposition: A | Discharge status: Home / Self Care | Payer: PRIVATE HEALTH INSURANCE | Source: Ambulatory Visit | Attending: Gastroenterology | Admitting: Gastroenterology

## 2013-11-12 ENCOUNTER — Telehealth: Payer: Self-pay | Admitting: Gastroenterology

## 2013-11-12 DIAGNOSIS — R1011 Right upper quadrant pain: Secondary | ICD-10-CM | POA: Diagnosis present

## 2013-11-12 DIAGNOSIS — K7689 Other specified diseases of liver: Secondary | ICD-10-CM | POA: Insufficient documentation

## 2013-11-12 DIAGNOSIS — R110 Nausea: Secondary | ICD-10-CM | POA: Diagnosis not present

## 2013-11-12 NOTE — Telephone Encounter (Signed)
Please let the patient know that her ultrasound showed fatty liver, which is very common and should not be the cause of her pain.  Proceed with HIDA scan as discussed.  Thank you,  Jess

## 2013-11-12 NOTE — Telephone Encounter (Signed)
Patient is calling for results. Please, advise.

## 2013-11-12 NOTE — Telephone Encounter (Signed)
Spoke with patient and gave her results and recommendations.

## 2013-11-18 ENCOUNTER — Ambulatory Visit (HOSPITAL_COMMUNITY)
Admission: RE | Admit: 2013-11-18 | Discharge: 2013-11-18 | Disposition: A | Discharge status: Home / Self Care | Payer: PRIVATE HEALTH INSURANCE | Source: Ambulatory Visit | Attending: Diagnostic Radiology | Admitting: Diagnostic Radiology

## 2013-11-18 DIAGNOSIS — R1011 Right upper quadrant pain: Secondary | ICD-10-CM | POA: Diagnosis present

## 2013-11-18 DIAGNOSIS — K828 Other specified diseases of gallbladder: Secondary | ICD-10-CM | POA: Insufficient documentation

## 2013-11-18 MED ORDER — TECHNETIUM TC 99M MEBROFENIN IV KIT
5.1000 | PACK | Freq: Once | INTRAVENOUS | Status: AC | PRN
  Administered 2013-11-18: 5 via INTRAVENOUS

## 2013-11-19 ENCOUNTER — Other Ambulatory Visit: Payer: Self-pay | Admitting: *Deleted

## 2013-11-19 DIAGNOSIS — K828 Other specified diseases of gallbladder: Secondary | ICD-10-CM

## 2014-01-12 ENCOUNTER — Other Ambulatory Visit: Payer: Self-pay | Admitting: Internal Medicine

## 2014-05-12 ENCOUNTER — Other Ambulatory Visit: Payer: Self-pay | Admitting: Internal Medicine

## 2014-05-26 ENCOUNTER — Other Ambulatory Visit: Payer: Self-pay | Admitting: Obstetrics and Gynecology

## 2014-05-27 LAB — CYTOLOGY - PAP

## 2014-08-18 ENCOUNTER — Other Ambulatory Visit: Payer: Self-pay | Admitting: Internal Medicine

## 2014-11-10 ENCOUNTER — Other Ambulatory Visit: Payer: Self-pay | Admitting: Internal Medicine

## 2014-12-07 ENCOUNTER — Telehealth: Payer: Self-pay | Admitting: Internal Medicine

## 2014-12-07 ENCOUNTER — Encounter: Payer: Self-pay | Admitting: Gastroenterology

## 2014-12-07 ENCOUNTER — Ambulatory Visit (INDEPENDENT_AMBULATORY_CARE_PROVIDER_SITE_OTHER): Payer: BLUE CROSS/BLUE SHIELD | Admitting: Gastroenterology

## 2014-12-07 VITALS — BP 144/80 | Ht 66.0 in | Wt 240.0 lb

## 2014-12-07 DIAGNOSIS — R1012 Left upper quadrant pain: Secondary | ICD-10-CM | POA: Diagnosis not present

## 2014-12-07 NOTE — Progress Notes (Addendum)
     12/07/2014 Norma [name] [id number] October 14, 1970   History of Present Illness:  This is a 44 year old female who is known to Dr. Hilarie Fredrickson. She was last seen here in September 2015 for complaints of right upper quadrant abdominal pain. Ultrasound showed some fatty liver and HIDA scan was with borderline abnormal gallbladder ejection fraction.  She was referred to general surgeons and they recommended cholecystectomy, but patient declined. She tells me that she changed her diet and she thinks that her gallbladder is doing okay now. Despite change in her diet she's gained 19 pounds since her visit one year ago.  She presents to our office today with complaints of left upper quadrant abdominal/left chest wall pain. This began just 2 days ago. She denies any other associated symptoms. She thought maybe she was constipated so she increased her MiraLAX dosing and did move her bowels with no relief of her symptoms. Symptoms unaffected by eating. It feels best when she is stretched out standing or lying down. Hurts most when she is sitting or bending and putting pressure on the area. Does not wake her keep her from sleeping at night.  Used heating pad.  Described as a dull ache.  She had an EGD in September 2014 at which time she was found to have a nonobstructing Schatzki's ring. Biopsies of the stomach showed minimal chronic inflammation, negative for H. pylori. She also had a colonoscopy in September of 2014 at which time she was found to have 5 polyps which were removed; some were tubular adenomas and some were hyperplastic polyps.    Current Medications, Allergies, Past Medical History, Past Surgical History, Family History and Social History were reviewed in [employer] record.   Physical Exam: BP 144/80 mmHg  Ht 5\' 6"  (1.676 m)  Wt 240 lb (108.863 kg)  BMI 38.76 kg/m2 General: Well developed black female in no acute distress Head: Normocephalic and atraumatic Eyes:   Sclerae anicteric, conjunctiva pink  Ears: Normal auditory acuity Lungs: Clear throughout to auscultation Heart: Regular rate and rhythm Abdomen: Soft, non-distended.  Normal bowel sounds.  Non-tender. Musculoskeletal: Symmetrical with no gross deformities  Extremities: No edema  Neurological: Alert oriented x 4, grossly non-focal Psychological:  Alert and cooperative. Normal mood and affect  Assessment and Recommendations: -LUQ abdominal pain:  Unsure of source.  ? If this is GI related vs musculoskeletal, etc.  Pain is almost over ribcage/lower chest wall and I could not elicit any tenderness on exam.  No other associated symptoms and only present for 2 days.  Explained to her that I do not have an exact diagnosis but symptoms and exam not overly concerning to me at this point that I feel it is necessary to CT scan her, etc.  I have offered her anti-spasmodic but she declined.  I also suggested that she possibly try NSAID's for a couple of days for possibly non-GI source.  Suggested that she just give it some time and see if it resolves before extensive testing.  If continues or worsens and she develops other symptoms then she needs to call back and then I suppose that CT scan would be next step.  Addendum: Reviewed and agree with initial management. Jerene Bears, MD

## 2014-12-07 NOTE — Telephone Encounter (Signed)
Pt states that for the past 3-4 days she has been having terrible pain on her left side. States it moves to her back and side also. Pt reports the pain is better when she is laying down but when she sits it hurts very bad. Pt scheduled to see Alonza Bogus PA today at 3pm. Pt aware of appt.

## 2015-02-09 ENCOUNTER — Other Ambulatory Visit: Payer: Self-pay | Admitting: Internal Medicine

## 2015-03-13 HISTORY — PX: COLONOSCOPY: SHX174

## 2015-03-13 HISTORY — PX: POLYPECTOMY: SHX149

## 2015-06-17 ENCOUNTER — Other Ambulatory Visit: Payer: Self-pay | Admitting: Internal Medicine

## 2015-09-15 ENCOUNTER — Other Ambulatory Visit: Payer: Self-pay | Admitting: Internal Medicine

## 2015-10-26 ENCOUNTER — Ambulatory Visit (INDEPENDENT_AMBULATORY_CARE_PROVIDER_SITE_OTHER): Payer: BLUE CROSS/BLUE SHIELD | Admitting: Internal Medicine

## 2015-10-26 ENCOUNTER — Ambulatory Visit: Payer: BLUE CROSS/BLUE SHIELD | Admitting: Internal Medicine

## 2015-10-26 ENCOUNTER — Encounter: Payer: Self-pay | Admitting: Internal Medicine

## 2015-10-26 VITALS — BP 128/88 | HR 84 | Ht 65.75 in | Wt 219.5 lb

## 2015-10-26 DIAGNOSIS — Z86010 Personal history of colonic polyps: Secondary | ICD-10-CM

## 2015-10-26 DIAGNOSIS — R1011 Right upper quadrant pain: Secondary | ICD-10-CM

## 2015-10-26 DIAGNOSIS — K219 Gastro-esophageal reflux disease without esophagitis: Secondary | ICD-10-CM

## 2015-10-26 MED ORDER — NA SULFATE-K SULFATE-MG SULF 17.5-3.13-1.6 GM/177ML PO SOLN: ORAL | 0 refills | Status: DC

## 2015-10-26 MED ORDER — OMEPRAZOLE 40 MG PO CPDR: 40.0000 mg | DELAYED_RELEASE_CAPSULE | Freq: Two times a day (BID) | ORAL | 1 refills | Status: DC

## 2015-10-26 MED ORDER — POLYETHYLENE GLYCOL 3350 17 GM/SCOOP PO POWD: ORAL | 2 refills | Status: AC

## 2015-10-26 NOTE — Progress Notes (Signed)
Subjective:    Patient ID: Norma [name], female    DOB: [birth year], 45 y.o.   MRN: [id number]  HPI [name] is a 45 year old female with a history of GERD, Schatzki's ring, adenomatous colon polyps, intermittent right upper quadrant abdominal pain with possible biliary dyskinesia who is here for follow-up. She is here alone today and was last seen in the office on 12/07/2014 by Alonza Bogus, PA-C.  She reports that she had been doing well but over the last few weeks she has noticed increase in indigestion and heartburn. This is been associated with mild epigastric discomfort. She had been using omeprazole 40 mg daily and despite this symptoms arose. She is using Tums for breakthrough heartburn. She denies dysphagia or odynophagia. Occasionally Japanese rice feels like it gets stuck in her esophagus but no other rice bothers her and no other solid or liquid food dysphagia. She denies nausea and vomiting. Bowel movements have been regular for her and she is using MiraLAX intermittently which she feels helps. She denies blood in her stool or melena. She's been working to lose weight and has reduced her caloric intake by 50%. She's lost 18 pounds in the last 3-4 months. She is trying to lose additional weight. Her right upper quadrant pain still happens intermittently seems to be worse after eating. Not severe enough "to come back to see you". She states "I am not ready for gallbladder surgery". No jaundice or itching. No abdominal swelling or lower extremity edema. Denies fevers chills or night sweats  Review of Systems As per history of present illness, otherwise negative  Current Medications, Allergies, Past Medical History, Past Surgical History, Family History and Social History were reviewed in [employer] record.     Objective:   Physical Exam BP 128/88 (BP Location: Left Arm, Patient Position: Sitting, Cuff Size: Normal)   Pulse 84   Ht 5' 5.75" (1.67 m)  Comment: height measured without shoes  Wt 219 lb 8 oz (99.6 kg)   BMI 35.70 kg/m  Constitutional: Well-developed and well-nourished. No distress. HEENT: Normocephalic and atraumatic. Oropharynx is clear and moist. No oropharyngeal exudate. Conjunctivae are normal.  No scleral icterus. Neck: Neck supple. Trachea midline. Cardiovascular: Normal rate, regular rhythm and intact distal pulses. No M/R/G Pulmonary/chest: Effort normal and breath sounds normal. No wheezing, rales or rhonchi. Abdominal: Soft, nontender, nondistended. Bowel sounds active throughout. There are no masses palpable. No hepatosplenomegaly. Extremities: no clubbing, cyanosis, or edema Lymphadenopathy: No cervical adenopathy noted. Neurological: Alert and oriented to person place and time. Skin: Skin is warm and dry. No rashes noted. Psychiatric: Normal mood and affect. Behavior is normal.  NUCLEAR MEDICINE HEPATOBILIARY IMAGING WITH GALLBLADDER EF   TECHNIQUE: Sequential images of the abdomen were obtained out to 60 minutes following intravenous administration of radiopharmaceutical. After oral ingestion of Ensure, gallbladder ejection fraction was determined. At 60 min, normal ejection fraction is greater than 33%.   RADIOPHARMACEUTICALS:  5.1 [place]ie 123XX123 Choletec   COMPARISON:  Ultrasound of the abdomen of 11/12/2013   FINDINGS: The patient was injected with 5.1 mCi of technetium 17m Choletec intravenously and imaging over the upper abdomen was performed. The radionuclide appears throughout the liver. There is excretion into the intrahepatic ductal system with visualization of the common bile duct and gallbladder. This represents a normal nuclear medicine hepato biliary scan. The patient was then given a fatty meal with ensure and gallbladder ejection fraction was measured at 30.8% at 1 hr. Normal gallbladder ejection fraction  with Ensure is greater than 33%. The patient did not experience symptoms  after oral ingestion of Ensure.   IMPRESSION: 1. Normal nuclear medicine hepatobiliary scan. 2. Borderline abnormal gallbladder ejection fraction of 30.8%.     Electronically Signed   By: Ivar Drape M.D.   On: 11/18/2013 11:12      Assessment & Plan:  45 year old female with a history of GERD, Schatzki's ring, adenomatous colon polyps, intermittent right upper quadrant abdominal pain with possible biliary dyskinesia who is here for follow-up.  1. GERD -- Breakthrough heartburn despite omeprazole 40 mg daily. I recommended increasing omeprazole to 40 mg twice daily before meals for a month. If symptoms fail to return to normal, upper endoscopy is recommended. If symptoms become completely controlled reduce to once daily omeprazole. GERD diet and lifestyle recommended.  2. History of adenomatous colon polyps -- surveillance colonoscopy indicated next month. We discussed the risks, benefits and alternatives and she wishes to proceed.  3. Right upper quadrant pain -- intermittent and mild. Not associated with nausea or vomiting. Does not wake her from sleep. HIDA scan suggested element of biliary dyskinesia which is the most likely cause of this discomfort. If this worsens she will notify me and I would consider surgical evaluation for possible cholecystectomy.  25 minutes spent with the patient today. Greater than 50% was spent in counseling and coordination of care with the patient

## 2015-10-26 NOTE — Patient Instructions (Signed)
We have sent the following medications to your pharmacy for you to pick up at your convenience: Omeprazole 40 mg twice daily before meals x 1 month. Miralax 17 grams once daily  Call our office on 11/10/15 to let us know if your heartburn/GERD is better. We will await this call to determine rather you should remain scheduled for the endoscopy portion of your exam.  You have been scheduled for an endoscopy and colonoscopy. Please follow the written instructions given to you at your visit today. Please pick up your prep supplies at the pharmacy within the next 1-3 days. If you use inhalers (even only as needed), please bring them with you on the day of your procedure. Your physician has requested that you go to www.startemmi.com and enter the access code given to you at your visit today. This web site gives a general overview about your procedure. However, you should still follow specific instructions given to you by our office regarding your preparation for the procedure.  We have given you a GERD diet to look over and follow.  If you are age 46 or older, your body mass index should be between 23-30. Your Body mass index is 35.7 kg/m. If this is out of the aforementioned range listed, please consider follow up with your Primary Care Provider.  If you are age 89 or younger, your body mass index should be between 19-25. Your Body mass index is 35.7 kg/m. If this is out of the aformentioned range listed, please consider follow up with your Primary Care Provider.

## 2015-11-04 ENCOUNTER — Encounter: Payer: Self-pay | Admitting: Internal Medicine

## 2015-11-09 ENCOUNTER — Telehealth: Payer: Self-pay | Admitting: Internal Medicine

## 2015-11-09 NOTE — Telephone Encounter (Signed)
Pt called back with an update that she has not been taking the prilosec consistantly and she is still having some of the symptoms so she does want to proceed with EGD and colon. Encouraged pt to take the prilosec bid 8min prior to meals until procedure to see if that does help her symptoms. Dr. Hilarie Fredrickson notified of update.

## 2015-11-17 ENCOUNTER — Encounter: Payer: Self-pay | Admitting: Internal Medicine

## 2015-11-17 ENCOUNTER — Ambulatory Visit (AMBULATORY_SURGERY_CENTER): Payer: BLUE CROSS/BLUE SHIELD | Admitting: Internal Medicine

## 2015-11-17 VITALS — BP 118/82 | HR 74 | Temp 98.4°F | Resp 20 | Ht 65.75 in | Wt 219.0 lb

## 2015-11-17 DIAGNOSIS — D120 Benign neoplasm of cecum: Secondary | ICD-10-CM

## 2015-11-17 DIAGNOSIS — D122 Benign neoplasm of ascending colon: Secondary | ICD-10-CM

## 2015-11-17 DIAGNOSIS — Z86010 Personal history of colonic polyps: Secondary | ICD-10-CM | POA: Diagnosis present

## 2015-11-17 DIAGNOSIS — K317 Polyp of stomach and duodenum: Secondary | ICD-10-CM

## 2015-11-17 DIAGNOSIS — K219 Gastro-esophageal reflux disease without esophagitis: Secondary | ICD-10-CM | POA: Diagnosis not present

## 2015-11-17 MED ORDER — SODIUM CHLORIDE 0.9 % IV SOLN: 500.0000 mL | INTRAVENOUS | Status: DC

## 2015-11-17 NOTE — Op Note (Signed)
[facility] Patient Name: Norma [name] Procedure Date: 11/17/2015 2:48 PM MRN: [id number] Endoscopist: Jerene Bears , MD Age: 45 Referring MD:  Date of Birth: 05-10-1970 Gender: Female Account #: [id number] Procedure:                Colonoscopy Indications:              Surveillance: Personal history of adenomatous                            polyps on last colonoscopy 3 years ago Medicines:                Monitored Anesthesia Care Procedure:                Pre-Anesthesia Assessment:                           - Prior to the procedure, a History and Physical                            was performed, and patient medications and                            allergies were reviewed. The patient's tolerance of                            previous anesthesia was also reviewed. The risks                            and benefits of the procedure and the sedation                            options and risks were discussed with the patient.                            All questions were answered, and informed consent                            was obtained. Prior Anticoagulants: The patient has                            taken no previous anticoagulant or antiplatelet                            agents. ASA Grade Assessment: III - A patient with                            severe systemic disease. After reviewing the risks                            and benefits, the patient was deemed in                            satisfactory condition to undergo the procedure.  After obtaining informed consent, the colonoscope                            was passed under direct vision. Throughout the                            procedure, the patient's blood pressure, pulse, and                            oxygen saturations were monitored continuously. The                            Model PCF-H190L [id number]) scope was introduced                            through the anus and  advanced to the the cecum,                            identified by appendiceal orifice and ileocecal                            valve. The colonoscopy was performed without                            difficulty. The patient tolerated the procedure                            well. The quality of the bowel preparation was                            good. The ileocecal valve, appendiceal orifice, and                            rectum were photographed. Scope In: 2:50:08 PM Scope Out: 3:10:00 PM Scope Withdrawal Time: 0 hours 14 minutes 56 seconds  Total Procedure Duration: 0 hours 19 minutes 52 seconds  Findings:                 The digital rectal exam was normal.                           Two sessile polyps were found in the cecum. The                            polyps were 3 to 4 mm in size. These polyps were                            removed with a cold snare. Resection and retrieval                            were complete.                           A 5 mm polyp was found in the ascending colon. The  polyp was sessile. The polyp was removed with a                            cold snare. Resection and retrieval were complete.                           The exam was otherwise normal throughout the                            examined colon.                           Internal hemorrhoids were found during                            retroflexion. The hemorrhoids were small. Complications:            No immediate complications. Estimated Blood Loss:     Estimated blood loss was minimal. Impression:               - Two 3 to 4 mm polyps in the cecum, removed with a                            cold snare. Resected and retrieved.                           - One 5 mm polyp in the ascending colon, removed                            with a cold snare. Resected and retrieved.                           - Internal hemorrhoids. Recommendation:           - Patient has a contact  number available for                            emergencies. The signs and symptoms of potential                            delayed complications were discussed with the                            patient. Return to normal activities tomorrow.                            Written discharge instructions were provided to the                            patient.                           - Resume previous diet.                           - Continue present medications.                           -  Await pathology results.                           - Repeat colonoscopy is recommended for                            surveillance based on personal history of previous                            adenomatous polyps. The colonoscopy date will be                            determined after pathology results from today's                            exam become available for review. Jerene Bears, MD 11/17/2015 3:20:17 PM This report has been signed electronically.

## 2015-11-17 NOTE — Op Note (Signed)
[facility] Patient Name: Norma [name] Procedure Date: 11/17/2015 2:27 PM MRN: [id number] Endoscopist: Jerene Bears , MD Age: 45 Referring MD:  Date of Birth: 1970/12/19 Gender: Female Account #: [id number] Procedure:                Upper GI endoscopy Indications:              Follow-up of gastro-esophageal reflux disease Medicines:                Monitored Anesthesia Care Procedure:                Pre-Anesthesia Assessment:                           - Prior to the procedure, a History and Physical                            was performed, and patient medications and                            allergies were reviewed. The patient's tolerance of                            previous anesthesia was also reviewed. The risks                            and benefits of the procedure and the sedation                            options and risks were discussed with the patient.                            All questions were answered, and informed consent                            was obtained. Prior Anticoagulants: The patient has                            taken no previous anticoagulant or antiplatelet                            agents. ASA Grade Assessment: III - A patient with                            severe systemic disease. After reviewing the risks                            and benefits, the patient was deemed in                            satisfactory condition to undergo the procedure.                           After obtaining informed consent, the endoscope was  passed under direct vision. Throughout the                            procedure, the patient's blood pressure, pulse, and                            oxygen saturations were monitored continuously. The                            Model GIF-HQ190 [id number]) scope was introduced                            through the mouth, and advanced to the second part                            of  duodenum. The upper GI endoscopy was                            accomplished without difficulty. The patient                            tolerated the procedure well. Scope In: Scope Out: Findings:                 The examined esophagus was normal.                           A single 6 mm sessile polyp was found in the                            prepyloric region of the stomach. The polyp was                            removed with a hot snare. Resection and retrieval                            were complete.                           The exam of the stomach was otherwise normal.                           The examined duodenum was normal. Complications:            No immediate complications. Estimated Blood Loss:     Estimated blood loss: none. Impression:               - Normal esophagus.                           - A single gastric polyp. Resected and retrieved.                           - Normal examined duodenum. Recommendation:           - Patient has a contact number available for  emergencies. The signs and symptoms of potential                            delayed complications were discussed with the                            patient. Return to normal activities tomorrow.                            Written discharge instructions were provided to the                            patient.                           - Resume previous diet.                           - Continue present medications including omeprazole                            40 mg twice daily x 1 month, then can reduce to                            once daily thereafter.                           - Await pathology results.                           - Avoid NSAIDs for 3 weeks after polypectomy. Jerene Bears, MD 11/17/2015 3:17:39 PM This report has been signed electronically.

## 2015-11-17 NOTE — Progress Notes (Signed)
To pacu vss patent aw report to rn 

## 2015-11-17 NOTE — Progress Notes (Signed)
Called to room to assist during endoscopic procedure.  Patient ID and intended procedure confirmed with present staff. Received instructions for my participation in the procedure from the performing physician.  

## 2015-11-17 NOTE — Patient Instructions (Signed)
Discharge instructions given. Handouts on polyps and hemorrhoids. Resume previous medications. YOU HAD AN ENDOSCOPIC PROCEDURE TODAY AT THE [facility]:   Refer to the procedure report that was given to you for any specific questions about what was found during the examination.  If the procedure report does not answer your questions, please call your gastroenterologist to clarify.  If you requested that your care partner not be given the details of your procedure findings, then the procedure report has been included in a sealed envelope for you to review at your convenience later.  YOU SHOULD EXPECT: Some feelings of bloating in the abdomen. Passage of more gas than usual.  Walking can help get rid of the air that was put into your GI tract during the procedure and reduce the bloating. If you had a lower endoscopy (such as a colonoscopy or flexible sigmoidoscopy) you may notice spotting of blood in your stool or on the toilet paper. If you underwent a bowel prep for your procedure, you may not have a normal bowel movement for a few days.  Please Note:  You might notice some irritation and congestion in your nose or some drainage.  This is from the oxygen used during your procedure.  There is no need for concern and it should clear up in a day or so.  SYMPTOMS TO REPORT IMMEDIATELY:   Following lower endoscopy (colonoscopy or flexible sigmoidoscopy):  Excessive amounts of blood in the stool  Significant tenderness or worsening of abdominal pains  Swelling of the abdomen that is new, acute  Fever of 100F or higher   Following upper endoscopy (EGD)  Vomiting of blood or coffee ground material  New chest pain or pain under the shoulder blades  Painful or persistently difficult swallowing  New shortness of breath  Fever of 100F or higher  Black, tarry-looking stools  For urgent or emergent issues, a gastroenterologist can be reached at any hour by calling [phone].   DIET:  We do recommend a small meal at first, but then you may proceed to your regular diet.  Drink plenty of fluids but you should avoid alcoholic beverages for 24 hours.  ACTIVITY:  You should plan to take it easy for the rest of today and you should NOT DRIVE or use heavy machinery until tomorrow (because of the sedation medicines used during the test).    FOLLOW UP: Our staff will call the number listed on your records the next business day following your procedure to check on you and address any questions or concerns that you may have regarding the information given to you following your procedure. If we do not reach you, we will leave a message.  However, if you are feeling well and you are not experiencing any problems, there is no need to return our call.  We will assume that you have returned to your regular daily activities without incident.  If any biopsies were taken you will be contacted by phone or by letter within the next 1-3 weeks.  Please call us at [phone] if you have not heard about the biopsies in 3 weeks.    SIGNATURES/CONFIDENTIALITY: You and/or your care partner have signed paperwork which will be entered into your electronic medical record.  These signatures attest to the fact that that the information above on your After Visit Summary has been reviewed and is understood.  Full responsibility of the confidentiality of this discharge information lies with you and/or your care-partner.  

## 2015-11-18 ENCOUNTER — Telehealth: Payer: Self-pay | Admitting: *Deleted

## 2015-11-18 NOTE — Telephone Encounter (Signed)
  Follow up Call-  Call back number 11/17/2015  Post procedure Call Back phone  # [phone]  Permission to leave phone message Yes  Some recent data might be hidden     Patient questions:  Do you have a fever, pain , or abdominal swelling? No. Pain Score  0 *  Have you tolerated food without any problems? Yes.    Have you been able to return to your normal activities? Yes.    Do you have any questions about your discharge instructions: Diet   No. Medications  No. Follow up visit  No.  Do you have questions or concerns about your Care? No.  Actions: * If pain score is 4 or above: No action needed, pain <4. Patient stating some right hip pain and discomfort below shoulder blades last night. This has resolved. Patient is back to work today without difficulty.

## 2015-11-29 ENCOUNTER — Encounter: Payer: Self-pay | Admitting: Internal Medicine

## 2016-02-28 ENCOUNTER — Other Ambulatory Visit: Payer: Self-pay | Admitting: Internal Medicine

## 2016-08-15 ENCOUNTER — Other Ambulatory Visit: Payer: Self-pay | Admitting: Family Medicine

## 2016-08-15 DIAGNOSIS — R5381 Other malaise: Secondary | ICD-10-CM

## 2016-08-26 ENCOUNTER — Other Ambulatory Visit: Payer: Self-pay | Admitting: Internal Medicine

## 2016-09-25 ENCOUNTER — Other Ambulatory Visit: Payer: Self-pay | Admitting: Family Medicine

## 2016-09-25 DIAGNOSIS — N6320 Unspecified lump in the left breast, unspecified quadrant: Secondary | ICD-10-CM

## 2016-10-05 ENCOUNTER — Other Ambulatory Visit: Payer: BLUE CROSS/BLUE SHIELD

## 2016-10-19 ENCOUNTER — Other Ambulatory Visit: Payer: BLUE CROSS/BLUE SHIELD

## 2016-10-23 ENCOUNTER — Ambulatory Visit: Payer: BLUE CROSS/BLUE SHIELD

## 2016-10-23 ENCOUNTER — Ambulatory Visit
Admission: RE | Admit: 2016-10-23 | Discharge: 2016-10-23 | Disposition: A | Discharge status: Home / Self Care | Payer: BLUE CROSS/BLUE SHIELD | Source: Ambulatory Visit | Attending: Family Medicine | Admitting: Family Medicine

## 2016-10-23 DIAGNOSIS — N6320 Unspecified lump in the left breast, unspecified quadrant: Secondary | ICD-10-CM

## 2016-12-25 ENCOUNTER — Other Ambulatory Visit: Payer: Self-pay | Admitting: Internal Medicine

## 2017-04-25 ENCOUNTER — Other Ambulatory Visit: Payer: Self-pay | Admitting: Internal Medicine

## 2017-04-25 NOTE — Telephone Encounter (Signed)
Refill request for omeprazole 40 mg 1 a day. Last seen 10-26-2015. No scheduled follow up office visit. Please advise.

## 2017-06-27 ENCOUNTER — Encounter: Payer: Self-pay | Admitting: Gastroenterology

## 2017-06-27 ENCOUNTER — Ambulatory Visit: Payer: Managed Care, Other (non HMO) | Admitting: Gastroenterology

## 2017-06-27 VITALS — BP 132/84 | HR 86 | Ht 66.0 in | Wt 243.0 lb

## 2017-06-27 DIAGNOSIS — K219 Gastro-esophageal reflux disease without esophagitis: Secondary | ICD-10-CM | POA: Diagnosis not present

## 2017-06-27 DIAGNOSIS — R053 Chronic cough: Secondary | ICD-10-CM

## 2017-06-27 DIAGNOSIS — R05 Cough: Secondary | ICD-10-CM | POA: Diagnosis not present

## 2017-06-27 MED ORDER — OMEPRAZOLE 40 MG PO CPDR: 40.0000 mg | DELAYED_RELEASE_CAPSULE | Freq: Two times a day (BID) | ORAL | 1 refills | Status: DC

## 2017-06-27 NOTE — Patient Instructions (Addendum)
If you are age 48 or older, your body mass index should be between 23-30. Your Body mass index is 39.22 kg/m. If this is out of the aforementioned range listed, please consider follow up with your Primary Care Provider.  If you are age 10 or younger, your body mass index should be between 19-25. Your Body mass index is 39.22 kg/m. If this is out of the aformentioned range listed, please consider follow up with your Primary Care Provider.   We have sent the following medications to your pharmacy for you to pick up at your convenience: Omeprazole 40 mg  You have been scheduled for a 24 Hour PH Probe test at [facility] on 07/24/17 at 8:30 am. Please arrive 30 minutes prior to your procedure for registration. You will need to go to outpatient registration (1st floor of the hospital) first. Make certain to bring your insurance cards as well as a complete list of medications.  Please remember the following:  1) Do not take any muscle relaxants, xanax (alprazolam) or ativan for 1 day prior to your     test as well as the day of the test.  2) Nothing to eat or drink after 12:00 midnight on the night before your test.  3) Hold all diabetic medications/insulin the morning of the test. You may eat and take             your medications after the test.   It will take at least 2 weeks to receive the results of this test from your physician. ------------------------------------------ ABOUT 24 HOUR [place] PROBE An esophageal pH test measures and records the pH in your esophagus to determine if you have gastroesophageal reflux disease (GERD). The test can also be done to determine the effectiveness of medications or surgical treatment for GERD. What is esophageal reflux? Esophageal reflux is a condition in which stomach acid refluxes or moves back into the esophagus (the "food pipe" leading from the mouth to the stomach). How does the esophageal pH test work? A thin, small tube with an acid sensing  device on the tip is gently passed through your nose, down the esophagus ("food tube"), and positioned about 2 inches above the lower esophageal sphincter. The tube is secured to the side of your face with clear tape. The end of the tube exiting from your nose is attached to a portable recorder that is worn on your belt or over your shoulder. The recorder has several buttons on it that you will press to mark certain events. A nurse will review the monitoring instructions with you. Once the test has begun, what do I need to know and do? Activity: Follow your usual daily routine. Do not reduce or change your activities during the monitoring period. Doing so can make the monitoring results less useful.  Note: do not take a tub bath or shower; the equipment can't get wet.  Eating: Eat your regular meals at the usual times. If you do not eat during the monitoring period, your stomach will not produce acid as usual, and the test results will not be accurate. Eat at least 2 meals a day. Eat foods that tend to increase your symptoms (without making yourself miserable). Avoid snacking. Do not suck on hard candy or lozenges and do not chew gum during the monitoring period.  Lying down: Remain upright throughout the day. Do not lie down until you go to bed (unless napping or lying down during the day is part of your  daily routine).  Medications: Continue to follow your doctor's advice regarding medications to avoid during the monitoring period.  Recording symptoms: Press the appropriate button on your recorder when symptoms occur (as discussed with the nurse).  Recording events: Record the time you start and stop eating and drinking (anything other than plain water). Record the time you lie down (even if just resting) and when you get back up. The nurse will explain this.  Unusual symptoms or side effects. If you think you may be experiencing any unusual symptoms or side effects, call your doctor.  You will return  the next day to have the tube removed. The information on the recorder will be downloaded to a computer and the results will be analyzed.  After completion of the study Resume your normal diet and medications. Lozenges or hard candy may help ease any sore throat caused by the tube.   Thank you for choosing me and [facility].   Alonza Bogus, PA-C

## 2017-06-27 NOTE — Progress Notes (Addendum)
06/27/2017 Norma [name] [id number] 06-30-1970   HISTORY OF PRESENT ILLNESS:  This is a 47 year old female who is known to Dr. Hilarie Fredrickson for treatment of her GERD.  She is currently on omeprazole 40 mg daily.  She presents to our office today complaining of a cough that has apparently been present since January.  She says that she had a cold when it started and since then has been treated with 3 different antibiotics and steroids but continues to have a terrible cough.  She does says that the cough does not bother her or keep her up at night.  Her last EGD was in 11/2015 at which it was normal except for a single gastric polyp that was hyperplastic on pathology.  Does not complain of classic heartburn or reflux, but sometimes say that it is uncomfortable in her epigastrium where her bra sits.   Past Medical History:  Diagnosis Date  . Anemia   . Anxiety   . Arthritis    KNEES  . Chronic headache   . Colon polyps    TUBULAR ADENOMAS AND HYPERPLASTIC   . Depression   . GERD (gastroesophageal reflux disease)   . Hyperlipidemia   . Hypertension   . Obesity    Past Surgical History:  Procedure Laterality Date  . BACK SURGERY    . BARTHOLIN GLAND CYST EXCISION    . COLONOSCOPY    . LUMBAR LAMINECTOMY/DECOMPRESSION MICRODISCECTOMY Right 07/17/2013   Procedure: LUMBAR LAMINECTOMY/DECOMPRESSION MICRODISCECTOMY 1 LEVEL,RIGHT LUMBAR FOUR-FIVE;  Surgeon: Faythe Ghee, MD;  Location: [facility];  Service: Neurosurgery;  Laterality: Right;  Right  . PARTIAL HYSTERECTOMY    . POLYPECTOMY      reports that she has never smoked. She has never used smokeless tobacco. She reports that she drinks alcohol. She reports that she does not use drugs. family history includes Colon polyps in her maternal uncle; Diabetes in her father and mother; Liver disease in her maternal grandmother. No Known Allergies    Outpatient Encounter Medications as of 06/27/2017  Medication Sig  . Albuterol Sulfate  (PROAIR RESPICLICK IN) Inhale into the lungs as needed.  [name] Kitchen amLODipine (NORVASC) 5 MG tablet Take 5 mg by mouth at bedtime.  [name] Kitchen buPROPion (WELLBUTRIN XL) 150 MG 24 hr tablet Take 300 mg by mouth daily.   [name] Kitchen ezetimibe (ZETIA) 10 MG tablet Take 10 mg by mouth daily.  [name] Kitchen losartan (COZAAR) 100 MG tablet Take 100 mg by mouth daily.  [name] Kitchen omeprazole (PRILOSEC) 40 MG capsule Take 1 capsule (40 mg total) by mouth 2 (two) times daily.  . polyethylene glycol powder (GLYCOLAX/MIRALAX) powder Dissolve 17 grams in at least 8 ounces water/juice and drink once daily.  [name] Kitchen telmisartan (MICARDIS) 80 MG tablet Take 80 mg by mouth daily.  . Tofacitinib Citrate (XELJANZ XR) 11 MG TB24 Take by mouth.  . [DISCONTINUED] omeprazole (PRILOSEC) 40 MG capsule Take 1 capsule (40 mg total) by mouth daily.  . [DISCONTINUED] omeprazole (PRILOSEC) 40 MG capsule TAKE 1 CAPSULE BY MOUTH ONCE DAILY   Facility-Administered Encounter Medications as of 06/27/2017  Medication  . 0.9 %  sodium chloride infusion     REVIEW OF SYSTEMS  : All other systems reviewed and negative except where noted in the History of Present Illness.   PHYSICAL EXAM: BP 132/84   Pulse 86   Ht 5\' 6"  (1.676 m)   Wt 243 lb (110.2 kg)   BMI 39.22 kg/m  General: Well developed black  female in NAD. Head: Normocephalic and atraumatic Eyes:  Sclerae anicteric, conjunctiva pink. Ears: Normal auditory acuity Lungs: Clear throughout to auscultation; no increased WOB. Heart: Regular rate and rhythm; no M/R/G. Abdomen: Soft, non-distended.  BS present.  Non-tender. Musculoskeletal: Symmetrical with no gross deformities  Skin: No lesions on visible extremities Extremities: No edema  Neurological: Alert oriented x 4, grossly non-focal Psychological:  Alert and cooperative. Normal mood and affect  ASSESSMENT AND PLAN: *Chronic cough, GERD:  Is only on omeprazole 40 mg daily.  Will increase to twice daily.  I am not even quite sure if this is reflux related; it  started after an URI and has been on a few different antibiotics and steroids.  I am going to schedule her for a 24 pH study with impedence as well but we are going to plan for this about 4 weeks from now and if she has improvement in or resolution of her symptoms then she will cancel the procedure.  Will do the study while on her PPI so that we can see if she is getting reflux episodes despite the medication or if she is having adequate suppression.   CC:  Sharilyn Sites, MD  Addendum: Reviewed and agree with  management. Pyrtle, Lajuan Lines, MD

## 2017-07-16 ENCOUNTER — Telehealth: Payer: Self-pay | Admitting: Gastroenterology

## 2017-07-16 NOTE — Telephone Encounter (Signed)
The pt states she can not afford to have the 24 hour probe.  She will call back if she wished to reschedule.

## 2017-07-16 NOTE — Telephone Encounter (Signed)
Saw Janett Billow

## 2017-07-24 ENCOUNTER — Encounter (HOSPITAL_COMMUNITY): Admission: RE | Payer: Self-pay | Source: Ambulatory Visit

## 2017-07-24 ENCOUNTER — Ambulatory Visit (HOSPITAL_COMMUNITY)
Admission: RE | Admit: 2017-07-24 | Payer: Managed Care, Other (non HMO) | Source: Ambulatory Visit | Admitting: Gastroenterology

## 2017-07-24 SURGERY — MONITORING, ESOPHAGEAL PH, 24 HOUR

## 2017-08-23 ENCOUNTER — Ambulatory Visit: Payer: Managed Care, Other (non HMO) | Admitting: Family Medicine

## 2017-08-27 ENCOUNTER — Ambulatory Visit: Payer: Managed Care, Other (non HMO) | Admitting: Family Medicine

## 2018-09-23 ENCOUNTER — Encounter: Payer: Self-pay | Admitting: Neurology

## 2018-09-23 ENCOUNTER — Ambulatory Visit (INDEPENDENT_AMBULATORY_CARE_PROVIDER_SITE_OTHER): Payer: Managed Care, Other (non HMO) | Admitting: Neurology

## 2018-09-23 ENCOUNTER — Other Ambulatory Visit: Payer: Self-pay

## 2018-09-23 VITALS — BP 124/90 | HR 68 | Temp 97.7°F | Ht 66.75 in | Wt 248.0 lb

## 2018-09-23 DIAGNOSIS — M792 Neuralgia and neuritis, unspecified: Secondary | ICD-10-CM

## 2018-09-23 DIAGNOSIS — M7918 Myalgia, other site: Secondary | ICD-10-CM

## 2018-09-23 DIAGNOSIS — E538 Deficiency of other specified B group vitamins: Secondary | ICD-10-CM

## 2018-09-23 DIAGNOSIS — M1990 Unspecified osteoarthritis, unspecified site: Secondary | ICD-10-CM

## 2018-09-23 DIAGNOSIS — M4722 Other spondylosis with radiculopathy, cervical region: Secondary | ICD-10-CM

## 2018-09-23 DIAGNOSIS — R202 Paresthesia of skin: Secondary | ICD-10-CM

## 2018-09-23 DIAGNOSIS — R200 Anesthesia of skin: Secondary | ICD-10-CM | POA: Diagnosis not present

## 2018-09-23 MED ORDER — DICLOFENAC SODIUM 1 % TD GEL: 4.0000 g | Freq: Four times a day (QID) | TRANSDERMAL | 6 refills | Status: DC

## 2018-09-23 NOTE — Patient Instructions (Signed)
Blood work today Recommend Topical creams (Voltaren, lidocaine, capsaicin), heat Will send to PT - dry needling for cervical myofascial pain Good posture Continue Lyrica, follow up with Simona Huh  Diclofenac skin gel What is this medicine? DICLOFENAC (dye KLOE fen ak) is a non-steroidal anti-inflammatory drug (NSAID). The 1% skin gel is used to treat osteoarthritis of the hands or knees. The 3% skin gel is used to treat actinic keratosis. This medicine may be used for other purposes; ask your health care provider or pharmacist if you have questions. COMMON BRAND NAME(S): DSG Sherrye Payor, Solaravix, Solaraze, Voltaren Gel What should I tell my health care provider before I take this medicine? They need to know if you have any of these conditions:  asthma  bleeding problems  coronary artery bypass graft (CABG) surgery within the past 2 weeks  heart disease  high blood pressure  if you frequently drink alcohol containing drinks  kidney disease  liver disease  open or infected skin  stomach problems  an unusual or allergic reaction to diclofenac, aspirin, other NSAIDs, other medicines, benzyl alcohol (3% gel only), foods, dyes, or preservatives  pregnant or trying to get pregnant  breast-feeding How should I use this medicine? This medicine is for external use only. Follow the directions on the prescription label. Wash hands before and after use. Do not get this medicine in your eyes. If you do, rinse out with plenty of cool tap water. Use your doses at regular intervals. Do not use your medicine more often than directed. A special MedGuide will be given to you by the pharmacist with each prescription and refill of the 1% gel. Be sure to read this information carefully each time. Talk to your pediatrician regarding the use of this medicine in children. Special care may be needed. The 3% gel is not approved for use in children. Overdosage: If you think you have taken too much  of this medicine contact a poison control center or emergency room at once. NOTE: This medicine is only for you. Do not share this medicine with others. What if I miss a dose? If you miss a dose, use it as soon as you can. If it is almost time for your next dose, use only that dose. Do not use double or extra doses. What may interact with this medicine?  aspirin  NSAIDs, medicines for pain and inflammation, like ibuprofen or naproxen Do not use any other skin products without telling your doctor or health care professional. This list may not describe all possible interactions. Give your health care provider a list of all the medicines, herbs, non-prescription drugs, or dietary supplements you use. Also tell them if you smoke, drink alcohol, or use illegal drugs. Some items may interact with your medicine. What should I watch for while using this medicine? Tell your doctor or healthcare provider if your symptoms do not start to get better or if they get worse. You will need to follow up with your healthcare provider to monitor your progress. You may need to be treated for up to 3 months if you are using the 3% gel, but the full effect may not occur until 1 month after stopping treatment. If you develop a severe skin reaction, contact your doctor or healthcare provider immediately. This medicine may cause serious skin reactions. They can happen weeks to months after starting the medicine. Contact your healthcare provider right away if you notice fevers or flu-like symptoms with a rash. The rash may be red  or purple and then turn into blisters or peeling of the skin. Or, you might notice a red rash with swelling of the face, lips or lymph nodes in your neck or under your arms. This medicine can make you more sensitive to the sun. Keep out of the sun. If you cannot avoid being in the sun, wear protective clothing and use sunscreen. Do not use sun lamps or tanning beds/booths. Do not take medicines such as  ibuprofen and naproxen with this medicine. Side effects such as stomach upset, nausea, or ulcers may be more likely to occur. Many medicines available without a prescription should not be taken with this medicine. This medicine does not prevent heart attack or stroke. In fact, this medicine may increase the chance of a heart attack or stroke. The chance may increase with longer use of this medicine and in people who have heart disease. If you take aspirin to prevent heart attack or stroke, talk with your doctor or healthcare provider. This medicine can cause ulcers and bleeding in the stomach and intestines at any time during treatment. Do not smoke cigarettes or drink alcohol. These increase irritation to your stomach and can make it more susceptible to damage from this medicine. Ulcers and bleeding can happen without warning symptoms and can cause death. You may get drowsy or dizzy. Do not drive, use machinery, or do anything that needs mental alertness until you know how this medicine affects you. Do not stand or sit up quickly, especially if you are an older patient. This reduces the risk of dizzy or fainting spells. This medicine can cause you to bleed more easily. Try to avoid damage to your teeth and gums when you brush or floss your teeth. What side effects may I notice from receiving this medicine? Side effects that you should report to your doctor or health care professional as soon as possible:  allergic reactions like skin rash, itching or hives, swelling of the face, lips, or tongue  black or bloody stools, blood in the urine or vomit  blurred vision  chest pain  difficulty breathing or wheezing  nausea or vomiting  rash, fever, and swollen lymph nodes  redness, blistering, peeling or loosening of the skin, including inside the mouth  slurred speech or weakness on one side of the body  trouble passing urine or change in the amount of urine  unexplained weight gain or  swelling  unusually weak or tired  yellowing of eyes or skin Side effects that usually do not require medical attention (report to your doctor or health care professional if they continue or are bothersome):  dizziness  dry skin  headache  heartburn  increased sensitivity to the sun  stomach pain  tingling at the application site This list may not describe all possible side effects. Call your doctor for medical advice about side effects. You may report side effects to FDA at 1-800-FDA-1088. Where should I keep my medicine? Keep out of the reach of children. Store the 1% gel at room temperature between 15 and 30 degrees C (59 and 86 degrees F). Store the 3% gel at room temperature between 20 and 25 degrees C (68 and 77 degrees F). Protect from light. Throw away any unused medicine after the expiration date. NOTE: This sheet is a summary. It may not cover all possible information. If you have questions about this medicine, talk to your doctor, pharmacist, or health care provider.  2020 Elsevier/Gold Standard (2018-05-14 13:05:18)  Trigger Point Dry  Needling   What is Trigger Point Dry Needling (DN)?   1. DN is a physical therapy technique used to treat muscle pain and     Dysfunction.  Specifically, DN helps deactivate muscle trigger    points (Muscle Knots).   2. A thin filiform needle is used to penetrate the skin and stimulate    the underlying trigger point.  The goal is for a local twitch     response (LTR) to occur and for the trigger point to relax.  No    medication of any kind is injected during the procedure.   What Does Trigger Point Dry Needling Feel Like?   1. The procedures feels different for each individual patient.   Some    patients report that they do not actually feel the      needle enter the skin and overall the process is not  painful.  Very    mild bleeding may occur.  However, many patients feel a deep    cramping in the muscle in which the needle was  inserted. This is t   he local twitch response.    How Will I Feel After The Treatment?   1. Soreness is normal, and the onset of soreness may not occur for    a few hours.  Typically this soreness does not last longer than two    days.   2. Bruising is uncommon, however; ice can be used to decrease any    possible bruising.   3. In rare cases feeling tired or nauseous after the treatment is    normal.  In addition, your symptoms may get worse before they    get better, this period will typically not last longer than 24 hours.   What Can I do After My Treatment?   1.  Increase your hydration by drinking more water for the next 24    hours.   2.  You may place ice or heat on the areas treated that have become    sore, however don not use heat on inflamed or bruised areas.     Heat often brings more relief post needling.   3. You can continue your regular activities, but vigorous activity is    not recommended initially after the treatment for 24 hours.   4. DN is best combined with other physical therapy such as     strengthening, stretching, and other therapies.

## 2018-09-23 NOTE — Progress Notes (Signed)
[facility]    Provider:  Dr Jaynee Eagles Requesting Provider: Simona Huh MD Primary Care Provider:  Precious Gilding PA  CC:  Neck and should pain, tingling in the toes  HPI:  Norma [name] is a 48 y.o. female here as requested by  Simona Huh MD for neuropathic pain, unspecified if in any one location referring to.  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  She was recently started on Lyrica 25 mg and referred to neurology. She has never been diagnosed with Fibromyalgia but thinks she may have it. She is having bug bites and other sensory changes in the right shoulder area but there is no bug, sometimes she is using her mouse and she feels a burning sensation in her right (trap and scapula area), she was sitting on the toilet and had a severe left sided pain (point to the occipital nerve area), later that night she leaned over and it hit again but it was postural and improved with posture. She has not had it since. She had an itch in her shoulders as well, CBD cream helps, worse with posture. She has tight shoulders. She has tingling in the toes. No other focal neurologic deficits, associated symptoms, inciting events or modifiable factors.    Reviewed notes, labs and imaging from outside physicians, which showed:   Labs checked include uric acid, sed rate, ANA with reflex, rheumatoid factor, CRP and she has been followed for these by Simona Huh she did not send the values.  I reviewed Apolonio Schneiders McCoy's notes from pain management.  She is a 48 year old who is on chronic opioid management.  She is receiving opioids for neck pain and shoulder pain.  Past  treatment has included nonsteroidal anti-inflammatory drugs, non-opioid analgesics, oral corticosteroids and topical agents.  A recent visit occurred 2 months prior to this 1 that I am reviewing, last seen August 27, 2018.  Recent intervention included adding Celebrex, pain scores include a current pain level of 7 out of 10, pain is located in the upper back burning sensation, average pain in the last week 1 out of 10.  The patient's dietary intake is eating a variety of food.  She has diabetes.  Exercises 9 times per week.  Current symptoms include fatigue, nausea, vomiting and weakness but does not include chest pain, constipation, myalgias, sores or urinary frequency.  Last hemoglobin A1c was 7.  Reviewed labs, hemoglobin A1c 7 May 01, 2018.  Review of Systems: Patient complains of symptoms per HPI as well as the following symptoms: joint pain, joint swelling, cramps, aching muscles, depression, anxiety. Pertinent negatives and positives per HPI. All others negative.   Social History   Socioeconomic History  . Marital status: Single    Spouse name: Not on file  . Number of children: 1  . Years of education: Not on file  . Highest education level: Some college, no degree  Occupational History  . Occupation: Therapist, sports: [employer]  . Financial resource strain: Not on file  . Food insecurity    Worry: Not on file    Inability: Not on file  . Transportation needs    Medical: Not on file    Non-medical: Not on file  Tobacco Use  .  Smoking status: Never Smoker  . Smokeless tobacco: Never Used  Substance and Sexual Activity  . Alcohol use: Yes    Comment: OCC.  . Drug use: Never  . Sexual activity: Not on file  Lifestyle  . Physical activity    Days per week: Not on file    Minutes per session: Not on file  . Stress: Not on file  Relationships  . Social Herbalist on phone: Not on file    Gets together: Not on file    Attends religious  service: Not on file    Active member of club or organization: Not on file    Attends meetings of clubs or organizations: Not on file    Relationship status: Not on file  . Intimate partner violence    Fear of current or ex partner: Not on file    Emotionally abused: Not on file    Physically abused: Not on file    Forced sexual activity: Not on file  Other Topics Concern  . Not on file  Social History Narrative   Lives at home. Her son lives with her.    Right handed   Caffeine: few times a year    Family History  Problem Relation Age of Onset  . Colon polyps Maternal Uncle   . Diabetes Mother   . Aneurysm Mother   . Diabetes Father   . Liver disease Maternal Grandmother   . Rheum arthritis Maternal Grandmother   . Stroke Maternal Grandfather   . Parkinson's disease Paternal Grandmother     Past Medical History:  Diagnosis Date  . Anemia   . Anxiety   . Arthritis    KNEES  . Chronic headache   . Colon polyps    TUBULAR ADENOMAS AND HYPERPLASTIC   . DDD (degenerative disc disease), thoracic   . Depression   . Diabetes mellitus, type 2 ([place])   . Fibromyalgia   . GERD (gastroesophageal reflux disease)   . Goiter   . Hyperlipidemia   . Hypertension   . Low back pain   . Obesity   . RA (rheumatoid arthritis) ([place])   . Vitamin D deficiency     Patient Active Problem List   Diagnosis Date Noted  . Chronic cough 06/27/2017  . LUQ abdominal pain 12/07/2014  . RUQ abdominal pain 11/10/2013  . 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  . GERD (gastroesophageal reflux disease) 12/18/2012  . Hx of adenomatous colonic polyps 12/18/2012  . Chronic RLQ pain 12/18/2012  . IBS (irritable bowel syndrome) 01/22/2011  . HTN (hypertension) 01/22/2011  . Hyperlipidemia 01/22/2011  . Anxiety and depression 01/22/2011    Past Surgical History:  Procedure Laterality Date  . BACK SURGERY  2005   lumbar laminectomy  . BARTHOLIN GLAND CYST EXCISION    . COLONOSCOPY    . LUMBAR  LAMINECTOMY/DECOMPRESSION MICRODISCECTOMY Right 07/17/2013   Procedure: LUMBAR LAMINECTOMY/DECOMPRESSION MICRODISCECTOMY 1 LEVEL,RIGHT LUMBAR FOUR-FIVE;  Surgeon: Faythe Ghee, MD;  Location: [facility];  Service: Neurosurgery;  Laterality: Right;  Right  . PARTIAL HYSTERECTOMY    . POLYPECTOMY      Current Outpatient Medications  Medication Sig Dispense Refill  . buPROPion (WELLBUTRIN XL) 150 MG 24 hr tablet Take 300 mg by mouth daily.     . celecoxib (CELEBREX) 200 MG capsule Take 200 mg by mouth daily.    [name] Kitchen ezetimibe (ZETIA) 10 MG tablet Take 10 mg by mouth daily.    [name] Kitchen omeprazole (PRILOSEC) 40  MG capsule Take 1 capsule (40 mg total) by mouth 2 (two) times daily. (Patient taking differently: Take 40 mg by mouth daily. ) 180 capsule 1  . pregabalin (LYRICA) 25 MG capsule Take 1 capsule by mouth 2 (two) times a day.    . Semaglutide,0.25 or 0.5MG /DOS, (OZEMPIC, 0.25 OR 0.5 MG/DOSE,) 2 MG/1.5ML SOPN Inject 1 mg into the skin once a week.    . telmisartan (MICARDIS) 80 MG tablet Take 80 mg by mouth daily.    . diclofenac sodium (VOLTAREN) 1 % GEL Apply 4 g topically 4 (four) times daily. 100 g 6  . polyethylene glycol powder (GLYCOLAX/MIRALAX) powder Dissolve 17 grams in at least 8 ounces water/juice and drink once daily. (Patient not taking: Reported on 09/23/2018) 527 g 2   Current Facility-Administered Medications  Medication Dose Route Frequency Provider Last Rate Last Dose  . 0.9 %  sodium chloride infusion  500 mL Intravenous Continuous Pyrtle, Lajuan Lines, MD        Allergies as of 09/23/2018 - Review Complete 09/23/2018  Allergen Reaction Noted  . Other  09/23/2018    Vitals: BP 124/90 (BP Location: Right Arm, Patient Position: Sitting)   Pulse 68   Temp 97.7 F (36.5 C) Comment: taken by front staff  Ht 5' 6.75" (1.695 m)   Wt 248 lb (112.5 kg)   BMI 39.13 kg/m  Last Weight:  Wt Readings from Last 1 Encounters:  09/23/18 248 lb (112.5 kg)   Last Height:   Ht Readings from  Last 1 Encounters:  09/23/18 5' 6.75" (1.695 m)     Physical exam: Exam: Gen: NAD, conversant, well nourised, obese, well groomed                     CV: RRR, no MRG. No Carotid Bruits. No peripheral edema, warm, nontender Eyes: Conjunctivae clear without exudates or hemorrhage  Neuro: Detailed Neurologic Exam  Speech:    Speech is normal; fluent and spontaneous with normal comprehension.  Cognition:    The patient is oriented to person, place, and time;     recent and remote memory intact;     language fluent;     normal attention, concentration,     fund of knowledge Cranial Nerves:    The pupils are equal, round, and reactive to light. The fundi are normal and spontaneous venous pulsations are present. Visual fields are full to finger confrontation. Extraocular movements are intact. Trigeminal sensation is intact and the muscles of mastication are normal. The face is symmetric. The palate elevates in the midline. Hearing intact. Voice is normal. Shoulder shrug is normal. The tongue has normal motion without fasciculations.   Coordination:    Normal finger to nose and heel to shin. Normal rapid alternating movements.   Gait:    Heel-toe and tandem gait are normal.   Motor Observation:    No asymmetry, no atrophy, and no involuntary movements noted. Tone:    Normal muscle tone.    Posture:    Posture is normal. normal erect    Strength:    Strength is V/V in the upper and lower limbs.      Sensation: intact to LT     Reflex Exam:  DTR's:    Deep tendon reflexes in the upper and lower extremities are normal bilaterally.   Toes:    The toes are downgoing bilaterally.   Clonus:    Clonus is absent.    Assessment/Plan:   Stephani Police  Largent is a 48 y.o. female here as requested by  Simona Huh MD for neuropathic pain, unspecified if in any one location referring to.  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    Postural occipital and shoulder pain: Discussed good posture, ergonomic desktop. Improves with watching her posture. Conservative treatments and Lyrica helping, continue pain management with Simona Huh.   Blood work today - sensory changes in the toes likely diabetic but will also check B12 Recommend Topical creams (Voltaren, lidocaine, capsaicin), heat Will send to PT - dry needling for cervical myofascial pain Good posture Continue Lyrica, follow up with Simona Huh  She declines MRi cervical spine, prefers conservative measures.  Orders Placed This Encounter  Procedures  . B12 and Folate Panel  . Methylmalonic acid, serum  . Ambulatory referral to Physical Therapy   Meds ordered this encounter  Medications  . diclofenac sodium (VOLTAREN) 1 % GEL    Sig: Apply 4 g topically 4 (four) times daily.    Dispense:  100 g    Refill:  6    Cc: [place], [place], PA,  [place], [place], NP  Sarina Ill, MD  [facility] [address] [place], [place] [address]  Phone [phone] Fax [phone]

## 2018-09-27 LAB — B12 AND FOLATE PANEL
Folate: 8 ng/mL (ref >3.0)
Vitamin B-12: 393 pg/mL (ref 232–1245)

## 2018-09-27 LAB — METHYLMALONIC ACID, SERUM: Methylmalonic Acid: 180 nmol/L (ref 0–378)

## 2018-09-30 ENCOUNTER — Ambulatory Visit: Payer: Managed Care, Other (non HMO) | Attending: Neurology | Admitting: Physical Therapy

## 2018-09-30 ENCOUNTER — Encounter: Payer: Self-pay | Admitting: Physical Therapy

## 2018-09-30 ENCOUNTER — Other Ambulatory Visit: Payer: Self-pay

## 2018-09-30 DIAGNOSIS — R252 Cramp and spasm: Secondary | ICD-10-CM | POA: Diagnosis present

## 2018-09-30 DIAGNOSIS — M542 Cervicalgia: Secondary | ICD-10-CM | POA: Diagnosis not present

## 2018-09-30 NOTE — Patient Instructions (Signed)

## 2018-09-30 NOTE — Therapy (Signed)
[place], [place], [address] Phone: [phone]   Fax:  [phone]  Physical Therapy Evaluation  Patient Details  Name: Norma [name] MRN: [id number] Date of Birth: [birth year] Referring Provider (PT): Jaynee Eagles   Encounter Date: 09/30/2018  PT End of Session - 09/30/18 1717    Visit Number  1    Date for PT Re-Evaluation  12/01/18    PT Start Time  1647    PT Stop Time  1735    PT Time Calculation (min)  48 min    Activity Tolerance  Patient tolerated treatment well    Behavior During Therapy  [facility] for tasks assessed/performed       Past Medical History:  Diagnosis Date  . Anemia   . Anxiety   . Arthritis    KNEES  . Chronic headache   . Colon polyps    TUBULAR ADENOMAS AND HYPERPLASTIC   . DDD (degenerative disc disease), thoracic   . Depression   . Diabetes mellitus, type 2 ([place])   . Fibromyalgia   . GERD (gastroesophageal reflux disease)   . Goiter   . Hyperlipidemia   . Hypertension   . Low back pain   . Obesity   . RA (rheumatoid arthritis) ([place])   . Vitamin D deficiency     Past Surgical History:  Procedure Laterality Date  . BACK SURGERY  2005   lumbar laminectomy  . BARTHOLIN GLAND CYST EXCISION    . COLONOSCOPY    . LUMBAR LAMINECTOMY/DECOMPRESSION MICRODISCECTOMY Right 07/17/2013   Procedure: LUMBAR LAMINECTOMY/DECOMPRESSION MICRODISCECTOMY 1 LEVEL,RIGHT LUMBAR FOUR-FIVE;  Surgeon: Faythe Ghee, MD;  Location: [facility];  Service: Neurosurgery;  Laterality: Right;  Right  . PARTIAL HYSTERECTOMY    . POLYPECTOMY      There were no vitals filed for this visit.   Subjective Assessment - 09/30/18 1652    Subjective  Patient reports that she started having some neck pain a few years ago, she reports that she was waking up every morning with a "crick" in her neck.  Has RA.  She was on steroids a lot of last year.  She reports that she has tried to decrease  medication.  She reports that she is hurting more over the past few months.    Pertinent History  RA, GERD, anxiety, DM    Limitations  House hold activities;Reading;Writing    Patient Stated Goals  have less pain    Currently in Pain?  Yes    Pain Score  2     Pain Location  Neck    Pain Descriptors / Indicators  Spasm;Tightness;Aching    Pain Type  Acute pain    Pain Radiating Towards  has pain in the shoulder blades    Pain Onset  More than a month ago    Pain Frequency  Constant    Aggravating Factors   cold air pain up to 7/10 some postural pain at times    Pain Relieving Factors  change position helps 2/10    Effect of Pain on Daily Activities  just aches         [facility] PT Assessment - 09/30/18 0001      Assessment   Medical Diagnosis  cervical pain, spasm    Referring Provider (PT)  Jaynee Eagles    Onset Date/Surgical Date  09/23/18    Hand Dominance  Right    Prior Therapy  no  Precautions   Precautions  None      Balance Screen   Has the patient fallen in the past 6 months  No    Has the patient had a decrease in activity level because of a fear of falling?   No    Is the patient reluctant to leave their home because of a fear of falling?   No      Home Environment   Additional Comments  housework       Prior Function   Level of Independence  Independent    Vocation  Full time employment    Vocation Requirements  on computer all day    Leisure  no exercise      Posture/Postural Control   Posture Comments  fwd head, rounded shoulder      ROM / Strength   AROM / PROM / Strength  AROM;Strength      AROM   Overall AROM Comments  cervical ROM is decreased 50% with tightness, shoulder flexion and abduction 140 degrees with upper trap pain      Strength   Overall Strength Comments  4-/5 with pain in the shoulders and arms      Palpation   Palpation comment  has significant tightness and spasms in the upper traps and the cervical parapsinals and into the  rhomboids                Objective measurements completed on examination: See above findings.      [facility] Adult PT Treatment/Exercise - 09/30/18 0001      Modalities   Modalities  Electrical Stimulation;Moist Heat      Moist Heat Therapy   Number Minutes Moist Heat  13 Minutes    Moist Heat Location  Cervical      Electrical Stimulation   Electrical Stimulation Location  cervical     Electrical Stimulation Action  IFC    Electrical Stimulation Parameters  supine    Electrical Stimulation Goals  Pain             PT Education - 09/30/18 1715    Education Details  HEP for cervical and scapular retraction, shrugs    Person(s) Educated  Patient    Methods  Explanation;Demonstration;Handout    Comprehension  Verbalized understanding       PT Short Term Goals - 09/30/18 1723      PT SHORT TERM GOAL #1   Title  independent with initial HEP    Time  2    Period  Weeks    Status  New        PT Long Term Goals - 09/30/18 1723      PT LONG TERM GOAL #1   Title  understand posture and body mechanics    Time  8    Period  Weeks    Status  New      PT LONG TERM GOAL #2   Title  increase cervical ROM 25%    Time  8    Period  Weeks    Status  New      PT LONG TERM GOAL #3   Title  decreaes pain 50%    Time  8    Period  Weeks    Status  New      PT LONG TERM GOAL #4   Title  sleep with less interuptions    Time  8    Period  Weeks    Status  New  Plan - 09/30/18 1718    Clinical Impression Statement  Patient reports that she has DDD of the cervical spine, she reports pain and spasms over the past 1-2 years.  She had significant spasms in the upper traps, the cervical area and into the rhomboids.  Her cervical ROM is decreased 50%.  Her shoulder flexion is decreased with c/o pain i the upper traps.    Personal Factors and Comorbidities  Comorbidity 3+    Comorbidities  DM, RA, GERD, anxiety, DDD, fibromyalgia     Stability/Clinical Decision Making  Evolving/Moderate complexity    Clinical Decision Making  Moderate    Rehab Potential  Good    PT Frequency  2x / week    PT Duration  8 weeks    PT Treatment/Interventions  ADLs/Self Care Home Management;Cryotherapy;Electrical Stimulation;Iontophoresis 4mg /ml Dexamethasone;Moist Heat;Traction;Ultrasound;Therapeutic activities;Therapeutic exercise;Patient/family education;Manual techniques;Dry needling    PT Next Visit Plan  slowly start some scapular stability exercises, could try traction and DN    Consulted and Agree with Plan of Care  Patient       Patient will benefit from skilled therapeutic intervention in order to improve the following deficits and impairments:  Pain, Improper body mechanics, Postural dysfunction, Increased muscle spasms, Decreased range of motion, Decreased strength  Visit Diagnosis: 1. Cervicalgia   2. Cramp and spasm        Problem List Patient Active Problem List   Diagnosis Date Noted  . Chronic cough 06/27/2017  . LUQ abdominal pain 12/07/2014  . RUQ abdominal pain 11/10/2013  . 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  . GERD (gastroesophageal reflux disease) 12/18/2012  . Hx of adenomatous colonic polyps 12/18/2012  . Chronic RLQ pain 12/18/2012  . IBS (irritable bowel syndrome) 01/22/2011  . HTN (hypertension) 01/22/2011  . Hyperlipidemia 01/22/2011  . Anxiety and depression 01/22/2011    Sumner Boast., PT 09/30/2018, 5:26 PM  [place] [place], [place], [address] Phone: [phone]   Fax:  [phone]  Name: Norma [name] MRN: [id number] Date of Birth: 09-14-1970

## 2018-10-06 ENCOUNTER — Ambulatory Visit: Payer: Managed Care, Other (non HMO) | Admitting: Physical Therapy

## 2018-10-09 ENCOUNTER — Other Ambulatory Visit: Payer: Self-pay

## 2018-10-09 ENCOUNTER — Encounter: Payer: Self-pay | Admitting: Physical Therapy

## 2018-10-09 ENCOUNTER — Ambulatory Visit: Payer: Managed Care, Other (non HMO) | Admitting: Physical Therapy

## 2018-10-09 DIAGNOSIS — M542 Cervicalgia: Secondary | ICD-10-CM | POA: Diagnosis not present

## 2018-10-09 DIAGNOSIS — R252 Cramp and spasm: Secondary | ICD-10-CM

## 2018-10-09 NOTE — Therapy (Signed)
[place], [place], [address] Phone: [phone]   Fax:  [phone]  Physical Therapy Treatment  Patient Details  Name: Norma [name] MRN: [id number] Date of Birth: 1970-12-29 Referring Provider (PT): Jaynee Eagles   Encounter Date: 10/09/2018  PT End of Session - 10/09/18 1737    Visit Number  2    Date for PT Re-Evaluation  12/01/18    PT Start Time  1654    PT Stop Time  1750    PT Time Calculation (min)  56 min    Activity Tolerance  Patient tolerated treatment well    Behavior During Therapy  [facility] for tasks assessed/performed       Past Medical History:  Diagnosis Date  . Anemia   . Anxiety   . Arthritis    KNEES  . Chronic headache   . Colon polyps    TUBULAR ADENOMAS AND HYPERPLASTIC   . DDD (degenerative disc disease), thoracic   . Depression   . Diabetes mellitus, type 2 ([place])   . Fibromyalgia   . GERD (gastroesophageal reflux disease)   . Goiter   . Hyperlipidemia   . Hypertension   . Low back pain   . Obesity   . RA (rheumatoid arthritis) ([place])   . Vitamin D deficiency     Past Surgical History:  Procedure Laterality Date  . BACK SURGERY  2005   lumbar laminectomy  . BARTHOLIN GLAND CYST EXCISION    . COLONOSCOPY    . LUMBAR LAMINECTOMY/DECOMPRESSION MICRODISCECTOMY Right 07/17/2013   Procedure: LUMBAR LAMINECTOMY/DECOMPRESSION MICRODISCECTOMY 1 LEVEL,RIGHT LUMBAR FOUR-FIVE;  Surgeon: Faythe Ghee, MD;  Location: [facility];  Service: Neurosurgery;  Laterality: Right;  Right  . PARTIAL HYSTERECTOMY    . POLYPECTOMY      There were no vitals filed for this visit.  Subjective Assessment - 10/09/18 1657    Subjective  Patient has anxiety about the DN, wants to wait on that, has soreness in teh neck, upper traps and the rhomboids    Currently in Pain?  Yes    Pain Score  2     Pain Location  Neck    Aggravating Factors   cold air                        [facility] Adult PT Treatment/Exercise - 10/09/18 0001      Exercises   Exercises  Neck      Neck Exercises: Machines for Strengthening   UBE (Upper Arm Bike)  level 1 x 4 minutes      Neck Exercises: Theraband   Shoulder Extension  20 reps;Red    Rows  20 reps;Red    Shoulder External Rotation  20 reps;Red      Neck Exercises: Standing   Other Standing Exercises  w backs 10x    Other Standing Exercises  3# shrugs with upper trap and levator stretches      Moist Heat Therapy   Number Minutes Moist Heat  13 Minutes    Moist Heat Location  Cervical      Electrical Stimulation   Electrical Stimulation Location  cervical     Electrical Stimulation Action  IFC    Electrical Stimulation Parameters  supine    Electrical Stimulation Goals  Pain      Manual Therapy   Manual therapy comments  US/Estim combo to work the mms and get them to  relax, she is very tender and guarded to touch so she could not tolerate the aggressive STM               PT Short Term Goals - 10/09/18 1739      PT SHORT TERM GOAL #1   Title  independent with initial HEP    Status  Partially Met        PT Long Term Goals - 09/30/18 1723      PT LONG TERM GOAL #1   Title  understand posture and body mechanics    Time  8    Period  Weeks    Status  New      PT LONG TERM GOAL #2   Title  increase cervical ROM 25%    Time  8    Period  Weeks    Status  New      PT LONG TERM GOAL #3   Title  decreaes pain 50%    Time  8    Period  Weeks    Status  New      PT LONG TERM GOAL #4   Title  sleep with less interuptions    Time  8    Period  Weeks    Status  New            Plan - 10/09/18 1737    Clinical Impression Statement  Patient very apprehensive about DN, she wanted to try something else, she seemed to do okay with the exercises and was able to recall the HEP, she is very tight in the upper traps, did not tolerate the STM so changed to US/estim  combo to get the mms to relax    PT Next Visit Plan  may try traction, see how she did with [place] adding exercises and the combo    Consulted and Agree with Plan of Care  Patient       Patient will benefit from skilled therapeutic intervention in order to improve the following deficits and impairments:  Pain, Improper body mechanics, Postural dysfunction, Increased muscle spasms, Decreased range of motion, Decreased strength  Visit Diagnosis: 1. Cervicalgia   2. Cramp and spasm        Problem List Patient Active Problem List   Diagnosis Date Noted  . Chronic cough 06/27/2017  . LUQ abdominal pain 12/07/2014  . RUQ abdominal pain 11/10/2013  . 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  . GERD (gastroesophageal reflux disease) 12/18/2012  . Hx of adenomatous colonic polyps 12/18/2012  . Chronic RLQ pain 12/18/2012  . IBS (irritable bowel syndrome) 01/22/2011  . HTN (hypertension) 01/22/2011  . Hyperlipidemia 01/22/2011  . Anxiety and depression 01/22/2011    Sumner Boast., PT 10/09/2018, 5:41 PM  [place] [address] [address] [place], [place], [address] Phone: [phone]   Fax:  [phone]  Name: Norma [name] MRN: [id number] Date of Birth: 12/02/1970

## 2018-10-13 ENCOUNTER — Ambulatory Visit: Payer: Managed Care, Other (non HMO) | Admitting: Gastroenterology

## 2018-10-13 ENCOUNTER — Encounter: Payer: Self-pay | Admitting: Gastroenterology

## 2018-10-13 ENCOUNTER — Telehealth: Payer: Self-pay

## 2018-10-13 VITALS — BP 114/78 | HR 80 | Ht 65.75 in | Wt 243.2 lb

## 2018-10-13 DIAGNOSIS — K219 Gastro-esophageal reflux disease without esophagitis: Secondary | ICD-10-CM | POA: Diagnosis not present

## 2018-10-13 MED ORDER — OMEPRAZOLE 40 MG PO CPDR: 40.0000 mg | DELAYED_RELEASE_CAPSULE | Freq: Two times a day (BID) | ORAL | 1 refills | Status: DC

## 2018-10-13 NOTE — Telephone Encounter (Signed)
The pt has been advised and verbalized understanding.  

## 2018-10-13 NOTE — Patient Instructions (Addendum)
If you are age 49 or older, your body mass index should be between 23-30. Your Body mass index is 39.56 kg/m. If this is out of the aforementioned range listed, please consider follow up with your Primary Care Provider.  If you are age 57 or younger, your body mass index should be between 19-25. Your Body mass index is 39.56 kg/m. If this is out of the aformentioned range listed, please consider follow up with your Primary Care Provider.   To help prevent the possible spread of infection to our patients, communities, and staff; we will be implementing the following measures:  As of now we are not allowing any visitors/family members to accompany you to any upcoming appointments with [facility]. If you have any concerns about this please contact our office to discuss prior to the appointment.   We have sent the following medications to your pharmacy for you to pick up at your convenience:  Omeprazole 40mg : Take twice a day  Thank you for entrusting me with your care and for choosing [employer], Alonza Bogus, P.A. - C.

## 2018-10-13 NOTE — Progress Notes (Addendum)
10/13/2018 Norma [name] [id number] [birth year]   HISTORY OF PRESENT ILLNESS:  This is a 48 year old female who is here today for follow-up of her GERD.  She is doing well on omeprazole 40 mg daily.  She does not have any symptoms while on the medication.  The last time that she was here over a year ago she was complaining of a cough so she increased her omeprazole to twice daily for a few weeks, the cough resolved.  She went back to once daily and has not had any further issues since that time.  She is a patient of Dr. Vena Rua.   Past Medical History:  Diagnosis Date  . Anemia   . Anxiety   . Arthritis    KNEES  . Chronic headache   . Colon polyps    TUBULAR ADENOMAS AND HYPERPLASTIC   . DDD (degenerative disc disease), thoracic   . Depression   . Diabetes mellitus, type 2 ([place])   . Fibromyalgia   . GERD (gastroesophageal reflux disease)   . Goiter   . Hyperlipidemia   . Hypertension   . Low back pain   . Obesity   . RA (rheumatoid arthritis) ([place])   . Vitamin D deficiency    Past Surgical History:  Procedure Laterality Date  . BACK SURGERY  2005   lumbar laminectomy  . BARTHOLIN GLAND CYST EXCISION    . COLONOSCOPY    . LUMBAR LAMINECTOMY/DECOMPRESSION MICRODISCECTOMY Right 07/17/2013   Procedure: LUMBAR LAMINECTOMY/DECOMPRESSION MICRODISCECTOMY 1 LEVEL,RIGHT LUMBAR FOUR-FIVE;  Surgeon: Faythe Ghee, MD;  Location: [facility];  Service: Neurosurgery;  Laterality: Right;  Right  . PARTIAL HYSTERECTOMY    . POLYPECTOMY      reports that she has never smoked. She has never used smokeless tobacco. She reports current alcohol use. She reports that she does not use drugs. family history includes Aneurysm in her mother; Colon polyps in her maternal uncle; Diabetes in her father and mother; Liver disease in her maternal grandmother; Parkinson's disease in her paternal grandmother; Rheum arthritis in her maternal grandmother; Stroke in her maternal grandfather.  Allergies  Allergen Reactions  . Other     She believes Tizanidine caused nightmares and she did not tolerate the medication.      Outpatient Encounter Medications as of 10/13/2018  Medication Sig  . buPROPion (WELLBUTRIN XL) 150 MG 24 hr tablet Take 300 mg by mouth daily.   . celecoxib (CELEBREX) 200 MG capsule Take 200 mg by mouth as needed.   . diclofenac sodium (VOLTAREN) 1 % GEL Apply 4 g topically 4 (four) times daily. (Patient taking differently: Apply 4 g topically as needed. )  . ezetimibe (ZETIA) 10 MG tablet Take 10 mg by mouth daily.  [name] Kitchen omeprazole (PRILOSEC) 40 MG capsule Take 1 capsule (40 mg total) by mouth 2 (two) times daily. (Patient taking differently: Take 40 mg by mouth daily. )  . polyethylene glycol powder (GLYCOLAX/MIRALAX) powder Dissolve 17 grams in at least 8 ounces water/juice and drink once daily. (Patient taking differently: as needed. Dissolve 17 grams in at least 8 ounces water/juice and drink once daily.)  . pregabalin (LYRICA) 25 MG capsule Take 1 capsule by mouth 2 (two) times a day.  . Semaglutide,0.25 or 0.5MG /DOS, (OZEMPIC, 0.25 OR 0.5 MG/DOSE,) 2 MG/1.5ML SOPN Inject 1 mg into the skin once a week.  . telmisartan (MICARDIS) 80 MG tablet Take 80 mg by mouth daily.  . [DISCONTINUED] 0.9 %  sodium chloride infusion    No facility-administered encounter medications on file as of 10/13/2018.      REVIEW OF SYSTEMS  : All other systems reviewed and negative except where noted in the History of Present Illness.   PHYSICAL EXAM: BP 114/78   Pulse 80   Ht 5' 5.75" (1.67 m)   Wt 243 lb 4 oz (110.3 kg)   BMI 39.56 kg/m  General: Well developed black female in no acute distress Head: Normocephalic and atraumatic Eyes:  Sclerae anicteric, conjunctiva pink. Ears: Normal auditory acuity Lungs: Clear throughout to auscultation. No increased WOB. Heart: Regular rate and rhythm; no M/R/G. Abdomen: Soft, non-distended.  BS present.  Non-tender.  Musculoskeletal: Symmetrical with no gross deformities  Skin: No lesions on visible extremities Extremities: No edema  Neurological: Alert oriented x 4, grossly non-focal Psychological:  Alert and cooperative. Normal mood and affect  ASSESSMENT AND PLAN: *GERD:  Well controlled on omeprazole 40 mg daily (has it listed as BID and has done that in the past).  Will continue and send prescription refill.  CC:  Simona Huh, NP  Addendum: Reviewed and agree with assessment and management plan. Pyrtle, Lajuan Lines, MD

## 2018-10-13 NOTE — Telephone Encounter (Signed)
-----   Message from Loralie Champagne, PA-C sent at 10/13/2018  1:19 PM EDT ----- Please see my message to Dr. Hilarie Fredrickson listed below and let the patient know his response.  She only had 2 pre-cancerous polyps last time so guidelines say 5 years.  Thank you,  Jess  ----- Message ----- From: Jerene Bears, MD Sent: 10/13/2018   9:59 AM EDT To: Laban Emperor Zehr, PA-C  That is correct, only 2 adenomas on last exam, thus 5 yr recall Thanks ----- Message ----- From: Ritta Slot Sent: 10/13/2018   9:34 AM EDT To: Jerene Bears, MD  Saw patient in clinic today.  She was questioning about why this time colonoscopy is a 5 year interval recall and last time was 3 years.  I told her that I believe it was the amount of polyps because in 2014 she had 5 polyps and in 2017 she had 3 polyps.  Can you clarify because I told her I would check with you?  Thank you, Jess

## 2018-10-14 ENCOUNTER — Other Ambulatory Visit: Payer: Self-pay

## 2018-10-14 ENCOUNTER — Ambulatory Visit: Payer: Managed Care, Other (non HMO) | Attending: Neurology | Admitting: Physical Therapy

## 2018-10-14 DIAGNOSIS — M542 Cervicalgia: Secondary | ICD-10-CM | POA: Insufficient documentation

## 2018-10-14 DIAGNOSIS — R252 Cramp and spasm: Secondary | ICD-10-CM | POA: Insufficient documentation

## 2018-10-14 NOTE — Therapy (Signed)
[place], [place], [address] Phone: [phone]   Fax:  [phone]  Physical Therapy Treatment  Patient Details  Name: Norma [name] MRN: [id number] Date of Birth: [birth year] Referring Provider (PT): Forrestine Him Date: 10/14/2018  PT End of Session - 10/14/18 0829    Visit Number  3    Date for PT Re-Evaluation  12/01/18    PT Start Time  4580    PT Stop Time  0850    PT Time Calculation (min)  58 min       Past Medical History:  Diagnosis Date  . Anemia   . Anxiety   . Arthritis    KNEES  . Chronic headache   . Colon polyps    TUBULAR ADENOMAS AND HYPERPLASTIC   . DDD (degenerative disc disease), thoracic   . Depression   . Diabetes mellitus, type 2 ([place])   . Fibromyalgia   . GERD (gastroesophageal reflux disease)   . Goiter   . Hyperlipidemia   . Hypertension   . Low back pain   . Obesity   . RA (rheumatoid arthritis) ([place])   . Vitamin D deficiency     Past Surgical History:  Procedure Laterality Date  . BACK SURGERY  2005   lumbar laminectomy  . BARTHOLIN GLAND CYST EXCISION    . COLONOSCOPY    . LUMBAR LAMINECTOMY/DECOMPRESSION MICRODISCECTOMY Right 07/17/2013   Procedure: LUMBAR LAMINECTOMY/DECOMPRESSION MICRODISCECTOMY 1 LEVEL,RIGHT LUMBAR FOUR-FIVE;  Surgeon: Faythe Ghee, MD;  Location: [facility];  Service: Neurosurgery;  Laterality: Right;  Right  . PARTIAL HYSTERECTOMY    . POLYPECTOMY      There were no vitals filed for this visit.  Subjective Assessment - 10/14/18 0752    Subjective  doing okay, overall noticed decrease in pain in spasms. mornings are stiffest/most painful    Currently in Pain?  Yes    Pain Score  4     Pain Location  Neck    Pain Orientation  Right                       [facility] Adult PT Treatment/Exercise - 10/14/18 0001      Neck Exercises: Machines for Strengthening   UBE (Upper Arm Bike)  L 2 3 min fwd/3  back    Cybex Row  15# 2 sets 10    Lat Pull  15# 2 sets 10      Neck Exercises: Standing   Other Standing Exercises  head on ball with cerv retraction with 2# shld flex,chest press, abd and ER  10 each      Modalities   Modalities  Traction      Moist Heat Therapy   Number Minutes Moist Heat  15 Minutes    Moist Heat Location  Cervical      Electrical Stimulation   Electrical Stimulation Location  cervical     Electrical Stimulation Action  IFC    Electrical Stimulation Parameters  supine    Electrical Stimulation Goals  Pain      Traction   Type of Traction  Cervical    Min (lbs)  10    Time  10      Manual Therapy   Manual therapy comments  US/Estim combo to work the mms and get them to relax, prior to cerv traction   turned up so pt could feel muscles contract  PT Education - 10/14/18 0829    Education Details  further DN education and explaination    Person(s) Educated  Patient    Methods  Explanation    Comprehension  Verbalized understanding       PT Short Term Goals - 10/09/18 1739      PT SHORT TERM GOAL #1   Title  independent with initial HEP    Status  Partially Met        PT Long Term Goals - 09/30/18 1723      PT LONG TERM GOAL #1   Title  understand posture and body mechanics    Time  8    Period  Weeks    Status  New      PT LONG TERM GOAL #2   Title  increase cervical ROM 25%    Time  8    Period  Weeks    Status  New      PT LONG TERM GOAL #3   Title  decreaes pain 50%    Time  8    Period  Weeks    Status  New      PT LONG TERM GOAL #4   Title  sleep with less interuptions    Time  8    Period  Weeks    Status  New            Plan - 10/14/18 0830    Clinical Impression Statement  pt tolerated increased ther ex well but did need postural cuing. increased combo intensity so pt could feel more like what DN feels like ( pt wanting to try but still scared). Pt tolerated traction fair, unsure if helped or  not.    PT Treatment/Interventions  ADLs/Self Care Home Management;Cryotherapy;Electrical Stimulation;Iontophoresis 70m/ml Dexamethasone;Moist Heat;Traction;Ultrasound;Therapeutic activities;Therapeutic exercise;Patient/family education;Manual techniques;Dry needling    PT Next Visit Plan  assess traction. DN? progress ex and stab       Patient will benefit from skilled therapeutic intervention in order to improve the following deficits and impairments:  Pain, Improper body mechanics, Postural dysfunction, Increased muscle spasms, Decreased range of motion, Decreased strength  Visit Diagnosis: 1. Cervicalgia   2. Cramp and spasm        Problem List Patient Active Problem List   Diagnosis Date Noted  . Chronic cough 06/27/2017  . LUQ abdominal pain 12/07/2014  . RUQ abdominal pain 11/10/2013  . 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  . GERD (gastroesophageal reflux disease) 12/18/2012  . Hx of adenomatous colonic polyps 12/18/2012  . Chronic RLQ pain 12/18/2012  . IBS (irritable bowel syndrome) 01/22/2011  . HTN (hypertension) 01/22/2011  . Hyperlipidemia 01/22/2011  . Anxiety and depression 01/22/2011    Norma [name],Norma [name] PTA 10/14/2018, 8:40 AM  [address] [address]Phone: [phone]  Fax:  [phone] Name: Norma JONSMRN: [id number] of Birth: 2Jan 13, 1972

## 2018-10-17 ENCOUNTER — Ambulatory Visit: Payer: Managed Care, Other (non HMO) | Admitting: Physical Therapy

## 2018-10-17 ENCOUNTER — Other Ambulatory Visit: Payer: Self-pay

## 2018-10-17 DIAGNOSIS — M542 Cervicalgia: Secondary | ICD-10-CM

## 2018-10-17 DIAGNOSIS — R252 Cramp and spasm: Secondary | ICD-10-CM

## 2018-10-17 NOTE — Therapy (Signed)
[place], [place], [address] Phone: [phone]   Fax:  [phone]  Physical Therapy Treatment  Patient Details  Name: Norma [name] MRN: [id number] Date of Birth: Jun 03, 1970 Referring Provider (PT): Jaynee Eagles   Encounter Date: 10/17/2018  PT End of Session - 10/17/18 0829    Visit Number  4    Date for PT Re-Evaluation  12/01/18    PT Start Time  6962    PT Stop Time  0855    PT Time Calculation (min)  60 min       Past Medical History:  Diagnosis Date  . Anemia   . Anxiety   . Arthritis    KNEES  . Chronic headache   . Colon polyps    TUBULAR ADENOMAS AND HYPERPLASTIC   . DDD (degenerative disc disease), thoracic   . Depression   . Diabetes mellitus, type 2 ([place])   . Fibromyalgia   . GERD (gastroesophageal reflux disease)   . Goiter   . Hyperlipidemia   . Hypertension   . Low back pain   . Obesity   . RA (rheumatoid arthritis) ([place])   . Vitamin D deficiency     Past Surgical History:  Procedure Laterality Date  . BACK SURGERY  2005   lumbar laminectomy  . BARTHOLIN GLAND CYST EXCISION    . COLONOSCOPY    . LUMBAR LAMINECTOMY/DECOMPRESSION MICRODISCECTOMY Right 07/17/2013   Procedure: LUMBAR LAMINECTOMY/DECOMPRESSION MICRODISCECTOMY 1 LEVEL,RIGHT LUMBAR FOUR-FIVE;  Surgeon: Norma Ghee, MD;  Location: [facility];  Service: Neurosurgery;  Laterality: Right;  Right  . PARTIAL HYSTERECTOMY    . POLYPECTOMY      There were no vitals filed for this visit.  Subjective Assessment - 10/17/18 0754    Subjective  pretty good this morning- kinda use to pain. last tx was fine traction was okay    Currently in Pain?  Yes    Pain Score  2     Pain Location  Neck                       [facility] Adult PT Treatment/Exercise - 10/17/18 0001      Neck Exercises: Machines for Strengthening   UBE (Upper Arm Bike)  L 3 3 min fwd/3 back    Cybex Row  15# 2 sets 10    cued to keep shld relaxed   Lat Pull  15# 2 sets 10      Neck Exercises: Theraband   Shoulder Extension  20 reps;Red    Rows  20 reps;Red    Shoulder External Rotation  20 reps;Red      Neck Exercises: Standing   Neck Retraction  15 reps;3 secs    Wall Push Ups  10 reps    Other Standing Exercises  5# shrugs with upper trap and levator stretches   5# backward rolls     Moist Heat Therapy   Number Minutes Moist Heat  15 Minutes    Moist Heat Location  Cervical      Electrical Stimulation   Electrical Stimulation Location  cervical     Electrical Stimulation Action  IFC    Electrical Stimulation Parameters  supine    Electrical Stimulation Goals  Pain      Traction   Type of Traction  Cervical    Min (lbs)  10    Time  10  Manual Therapy   Manual therapy comments  US/Estim combo to work the mms and get them to relax, prior to cerv traction               PT Short Term Goals - 10/17/18 0830      PT SHORT TERM GOAL #1   Title  independent with initial HEP    Status  Partially Met        PT Long Term Goals - 09/30/18 1723      PT LONG TERM GOAL #1   Title  understand posture and body mechanics    Time  8    Period  Weeks    Status  New      PT LONG TERM GOAL #2   Title  increase cervical ROM 25%    Time  8    Period  Weeks    Status  New      PT LONG TERM GOAL #3   Title  decreaes pain 50%    Time  8    Period  Weeks    Status  New      PT LONG TERM GOAL #4   Title  sleep with less interuptions    Time  8    Period  Weeks    Status  New            Plan - 10/17/18 0830    Clinical Impression Statement  fatigued with ex and some increased pain. cuing to keep shld down with ex as she tends to elevate and guard. less muscle tightness with modalities today    PT Treatment/Interventions  ADLs/Self Care Home Management;Cryotherapy;Electrical Stimulation;Iontophoresis 69m/ml Dexamethasone;Moist Heat;Traction;Ultrasound;Therapeutic  activities;Therapeutic exercise;Patient/family education;Manual techniques;Dry needling    PT Next Visit Plan  DN?, tband HEP       Patient will benefit from skilled therapeutic intervention in order to improve the following deficits and impairments:  Pain, Improper body mechanics, Postural dysfunction, Increased muscle spasms, Decreased range of motion, Decreased strength  Visit Diagnosis: 1. Cervicalgia   2. Cramp and spasm        Problem List Patient Active Problem List   Diagnosis Date Noted  . Chronic cough 06/27/2017  . LUQ abdominal pain 12/07/2014  . RUQ abdominal pain 11/10/2013  . 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  . GERD (gastroesophageal reflux disease) 12/18/2012  . Hx of adenomatous colonic polyps 12/18/2012  . Chronic RLQ pain 12/18/2012  . IBS (irritable bowel syndrome) 01/22/2011  . HTN (hypertension) 01/22/2011  . Hyperlipidemia 01/22/2011  . Anxiety and depression 01/22/2011    Norma [name],Norma [name] PTA 10/17/2018, 8:33 AM  [address] [address]Phone: [phone]  Fax:  [phone] Name: Norma CIDMRN: [id number] of Birth: 210-11-1970

## 2018-10-21 ENCOUNTER — Other Ambulatory Visit: Payer: Self-pay

## 2018-10-21 ENCOUNTER — Encounter: Payer: Self-pay | Admitting: Physical Therapy

## 2018-10-21 ENCOUNTER — Ambulatory Visit: Payer: Managed Care, Other (non HMO) | Admitting: Physical Therapy

## 2018-10-21 DIAGNOSIS — M542 Cervicalgia: Secondary | ICD-10-CM | POA: Diagnosis not present

## 2018-10-21 DIAGNOSIS — R252 Cramp and spasm: Secondary | ICD-10-CM

## 2018-10-21 NOTE — Therapy (Signed)
[place], [place], [address] Phone: [phone]   Fax:  [phone]  Physical Therapy Treatment  Patient Details  Name: Norma [name] MRN: [id number] Date of Birth: 1971/02/14 Referring Provider (PT): Jaynee Eagles   Encounter Date: 10/21/2018  PT End of Session - 10/21/18 1737    Visit Number  5    Date for PT Re-Evaluation  12/01/18    PT Start Time  1653    PT Stop Time  1743    PT Time Calculation (min)  50 min    Activity Tolerance  Patient tolerated treatment well    Behavior During Therapy  [facility] for tasks assessed/performed       Past Medical History:  Diagnosis Date  . Anemia   . Anxiety   . Arthritis    KNEES  . Chronic headache   . Colon polyps    TUBULAR ADENOMAS AND HYPERPLASTIC   . DDD (degenerative disc disease), thoracic   . Depression   . Diabetes mellitus, type 2 ([place])   . Fibromyalgia   . GERD (gastroesophageal reflux disease)   . Goiter   . Hyperlipidemia   . Hypertension   . Low back pain   . Obesity   . RA (rheumatoid arthritis) ([place])   . Vitamin D deficiency     Past Surgical History:  Procedure Laterality Date  . BACK SURGERY  2005   lumbar laminectomy  . BARTHOLIN GLAND CYST EXCISION    . COLONOSCOPY    . LUMBAR LAMINECTOMY/DECOMPRESSION MICRODISCECTOMY Right 07/17/2013   Procedure: LUMBAR LAMINECTOMY/DECOMPRESSION MICRODISCECTOMY 1 LEVEL,RIGHT LUMBAR FOUR-FIVE;  Surgeon: Faythe Ghee, MD;  [facility]: [facility];  Service: Neurosurgery;  Laterality: Right;  Right  . PARTIAL HYSTERECTOMY    . POLYPECTOMY      There were no vitals filed for this visit.                    [place] Adult PT Treatment/Exercise - 10/21/18 0001      Neck Exercises: Machines for Strengthening   UBE (Upper Arm Bike)  L 3 3 min fwd/3 back    Cybex Row  15# 2 sets 10    Lat Pull  15# 2 sets 10      Neck Exercises: Theraband   Shoulder External Rotation  20  reps;Red      Neck Exercises: Standing   Other Standing Exercises  5# shrugs with upper trap and levator stretches      Neck Exercises: Supine   Cervical Isometrics  3 secs;10 reps      Moist Heat Therapy   Number Minutes Moist Heat  15 Minutes    Moist Heat Location  Cervical      Electrical Stimulation   Electrical Stimulation Location  cervical     Electrical Stimulation Action  IFC    Electrical Stimulation Parameters  supine    Electrical Stimulation Goals  Pain      Manual Therapy   Manual Therapy  Soft tissue mobilization    Soft tissue mobilization  to the right upper trap       Trigger Point Dry Needling - 10/21/18 0001    Consent Given?  Yes    Education Handout Provided  Yes    Muscles Treated Head and Neck  Upper trapezius    Upper Trapezius Response  Twitch reponse elicited;Palpable increased muscle length  PT Short Term Goals - 10/17/18 0830      PT SHORT TERM GOAL #1   Title  independent with initial HEP    Status  Partially Met        PT Long Term Goals - 10/21/18 1739      PT LONG TERM GOAL #1   Title  understand posture and body mechanics    Status  On-going      PT LONG TERM GOAL #2   Title  increase cervical ROM 25%      PT LONG TERM GOAL #3   Title  decreaes pain 50%    Status  On-going            Plan - 10/21/18 1737    Clinical Impression Statement  Patient had some really hard LTR's in the right upper trap, she did have me stop and was worried about soreness, but at the end after some STM and the heat and estim she seemed to be doing well, there was a palpable relaxation compared to when she came in , she is still very tight with knots in the mms    PT Next Visit Plan  see how she tolerated the DN    Consulted and Agree with Plan of Care  Patient       Patient will benefit from skilled therapeutic intervention in order to improve the following deficits and impairments:  Pain, Improper body mechanics, Postural  dysfunction, Increased muscle spasms, Decreased range of motion, Decreased strength  Visit Diagnosis: 1. Cervicalgia   2. Cramp and spasm        Problem List Patient Active Problem List   Diagnosis Date Noted  . Chronic cough 06/27/2017  . LUQ abdominal pain 12/07/2014  . RUQ abdominal pain 11/10/2013  . 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  . GERD (gastroesophageal reflux disease) 12/18/2012  . Hx of adenomatous colonic polyps 12/18/2012  . Chronic RLQ pain 12/18/2012  . IBS (irritable bowel syndrome) 01/22/2011  . HTN (hypertension) 01/22/2011  . Hyperlipidemia 01/22/2011  . Anxiety and depression 01/22/2011    Sumner Boast., PT 10/21/2018, 5:39 PM  [place] [address] [place], [place], [address] Phone: [phone]   Fax:  [phone]  Name: Norma [name] MRN: [id number] Date of Birth: Mar 04, 1971

## 2018-10-21 NOTE — Patient Instructions (Signed)

## 2018-10-24 ENCOUNTER — Ambulatory Visit: Payer: Managed Care, Other (non HMO) | Admitting: Physical Therapy

## 2018-10-24 ENCOUNTER — Encounter: Payer: Self-pay | Admitting: Physical Therapy

## 2018-10-24 ENCOUNTER — Other Ambulatory Visit: Payer: Self-pay

## 2018-10-24 DIAGNOSIS — M542 Cervicalgia: Secondary | ICD-10-CM | POA: Diagnosis not present

## 2018-10-24 DIAGNOSIS — R252 Cramp and spasm: Secondary | ICD-10-CM

## 2018-10-24 NOTE — Therapy (Signed)
[facility] [place], [place], [address] Phone: [phone]   Fax:  [phone]  Physical Therapy Treatment  Patient Details  Name: Norma [name] MRN: [id number] Date of Birth: [birth year] Referring Provider (PT): Jaynee Eagles   Encounter Date: 10/24/2018  PT End of Session - 10/24/18 0802    Visit Number  6    Date for PT Re-Evaluation  12/01/18    PT Start Time  0800    PT Stop Time  0858    PT Time Calculation (min)  58 min    Activity Tolerance  Patient tolerated treatment well    Behavior During Therapy  [facility] for tasks assessed/performed       Past Medical History:  Diagnosis Date  . Anemia   . Anxiety   . Arthritis    KNEES  . Chronic headache   . Colon polyps    TUBULAR ADENOMAS AND HYPERPLASTIC   . DDD (degenerative disc disease), thoracic   . Depression   . Diabetes mellitus, type 2 ([place])   . Fibromyalgia   . GERD (gastroesophageal reflux disease)   . Goiter   . Hyperlipidemia   . Hypertension   . Low back pain   . Obesity   . RA (rheumatoid arthritis) ([place])   . Vitamin D deficiency     Past Surgical History:  Procedure Laterality Date  . BACK SURGERY  2005   lumbar laminectomy  . BARTHOLIN GLAND CYST EXCISION    . COLONOSCOPY    . LUMBAR LAMINECTOMY/DECOMPRESSION MICRODISCECTOMY Right 07/17/2013   Procedure: LUMBAR LAMINECTOMY/DECOMPRESSION MICRODISCECTOMY 1 LEVEL,RIGHT LUMBAR FOUR-FIVE;  Surgeon: Faythe Ghee, MD;  Location: [facility];  Service: Neurosurgery;  Laterality: Right;  Right  . PARTIAL HYSTERECTOMY    . POLYPECTOMY      There were no vitals filed for this visit.  Subjective Assessment - 10/24/18 0803    Subjective  Patient felt fine the night of DN but the nex night had a sharp pain in right UT and so doesn't want DN again. She denies pain today.    Pertinent History  RA, GERD, anxiety, DM    Patient Stated Goals  have less pain    Currently in Pain?  No/denies                        [facility] Adult PT Treatment/Exercise - 10/24/18 0001      Neck Exercises: Machines for Strengthening   UBE (Upper Arm Bike)  L 3 3 min fwd/3 back    Cybex Row  15# 2 sets 10    Lat Pull  15# 2 sets 10      Neck Exercises: Theraband   Shoulder External Rotation  20 reps;Red      Neck Exercises: Standing   Other Standing Exercises  8# shrugs 2x10      Modalities   Modalities  Electrical Stimulation;Moist Heat      Moist Heat Therapy   Number Minutes Moist Heat  15 Minutes    Moist Heat Location  Cervical      Electrical Stimulation   Electrical Stimulation Location  cervical    Electrical Stimulation Action  IFC    Electrical Stimulation Parameters  prone    Electrical Stimulation Goals  Pain      Manual Therapy   Manual Therapy  Soft tissue mobilization;Myofascial release;Joint mobilization    Joint Mobilization  PA mobs cervical and upper  thoracic    Soft tissue mobilization  to bil UT, levator scap and paraspinals    Myofascial Release  TPR to bil UT               PT Short Term Goals - 10/17/18 0830      PT SHORT TERM GOAL #1   Title  independent with initial HEP    Status  Partially Met        PT Long Term Goals - 10/21/18 1739      PT LONG TERM GOAL #1   Title  understand posture and body mechanics    Status  On-going      PT LONG TERM GOAL #2   Title  increase cervical ROM 25%      PT LONG TERM GOAL #3   Title  decreaes pain 50%    Status  On-going            Plan - 10/24/18 0846    Clinical Impression Statement  Patient reported that she had a sharp pain in the right UT the day after DN and so did not want to try it again. She continues to have multiple TPs in UT and levators bil. Very tender along medial border of right scapula.Good cervical mobility with mobs.    PT Treatment/Interventions  ADLs/Self Care Home Management;Cryotherapy;Electrical Stimulation;Iontophoresis 21m/ml Dexamethasone;Moist  Heat;Traction;Ultrasound;Therapeutic activities;Therapeutic exercise;Patient/family education;Manual techniques;Dry needling    PT Next Visit Plan  continue manual vs. DN       Patient will benefit from skilled therapeutic intervention in order to improve the following deficits and impairments:  Pain, Improper body mechanics, Postural dysfunction, Increased muscle spasms, Decreased range of motion, Decreased strength  Visit Diagnosis: 1. Cervicalgia   2. Cramp and spasm        Problem List Patient Active Problem List   Diagnosis Date Noted  . Chronic cough 06/27/2017  . LUQ abdominal pain 12/07/2014  . RUQ abdominal pain 11/10/2013  . 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  . GERD (gastroesophageal reflux disease) 12/18/2012  . Hx of adenomatous colonic polyps 12/18/2012  . Chronic RLQ pain 12/18/2012  . IBS (irritable bowel syndrome) 01/22/2011  . HTN (hypertension) 01/22/2011  . Hyperlipidemia 01/22/2011  . Anxiety and depression 01/22/2011    JMadelyn FlavorsPT 10/24/2018, 8:52 AM  [address] [address]Phone: [phone]  Fax:  [phone] Name: JREIGNA RUPERTOMRN: [id number] of Birth: 203/10/1970

## 2019-05-22 ENCOUNTER — Ambulatory Visit: Payer: Managed Care, Other (non HMO) | Attending: Internal Medicine

## 2019-05-22 DIAGNOSIS — Z23 Encounter for immunization: Secondary | ICD-10-CM

## 2019-05-22 NOTE — Progress Notes (Signed)
   Covid-19 Vaccination Clinic  Name:  TWYLIA HALL    MRN: [id number] DOB: 01-27-1971  05/22/2019  Ms. Ragucci was observed post Covid-19 immunization for 15 minutes without incident. She was provided with Vaccine Information Sheet and instruction to access the V-Safe system.   Ms. Coville was instructed to call 911 with any severe reactions post vaccine: [name] Kitchen Difficulty breathing  . Swelling of face and throat  . A fast heartbeat  . A bad rash all over body  . Dizziness and weakness   Immunizations Administered    Name Date Dose VIS Date Route   Pfizer COVID-19 Vaccine 05/22/2019 11:31 AM 0.3 mL 02/20/2019 Intramuscular   Manufacturer: [place]   Lot: [id number]   [place]: [id number]

## 2019-06-17 ENCOUNTER — Ambulatory Visit: Payer: Managed Care, Other (non HMO) | Attending: Internal Medicine

## 2019-06-17 DIAGNOSIS — Z23 Encounter for immunization: Secondary | ICD-10-CM

## 2019-06-17 NOTE — Progress Notes (Signed)
   Covid-19 Vaccination Clinic  Name:  Norma [name]    MRN: [id number] DOB: 04-16-1970  06/17/2019  Ms. [name] was observed post Covid-19 immunization for 15 minutes without incident. She was provided with Vaccine Information Sheet and instruction to access the V-Safe system.   Ms. [name] was instructed to call 911 with any severe reactions post vaccine: [name] Kitchen Difficulty breathing  . Swelling of face and throat  . A fast heartbeat  . A bad rash all over body  . Dizziness and weakness   Immunizations Administered    Name Date Dose VIS Date Route   Pfizer COVID-19 Vaccine 06/17/2019  8:21 AM 0.3 mL 02/20/2019 Intramuscular   Manufacturer: [place]   Lot: [id number]   [place]: [id number]

## 2019-08-25 DIAGNOSIS — E119 Type 2 diabetes mellitus without complications: Secondary | ICD-10-CM | POA: Insufficient documentation

## 2020-02-17 ENCOUNTER — Other Ambulatory Visit: Payer: Self-pay | Admitting: Obstetrics and Gynecology

## 2020-02-17 DIAGNOSIS — R928 Other abnormal and inconclusive findings on diagnostic imaging of breast: Secondary | ICD-10-CM

## 2020-02-29 ENCOUNTER — Ambulatory Visit: Admission: RE | Admit: 2020-02-29 | Payer: Managed Care, Other (non HMO) | Source: Ambulatory Visit

## 2020-02-29 ENCOUNTER — Other Ambulatory Visit: Payer: Self-pay

## 2020-02-29 ENCOUNTER — Ambulatory Visit
Admission: RE | Admit: 2020-02-29 | Discharge: 2020-02-29 | Disposition: A | Discharge status: Home / Self Care | Payer: Managed Care, Other (non HMO) | Source: Ambulatory Visit | Attending: Obstetrics and Gynecology | Admitting: Obstetrics and Gynecology

## 2020-02-29 DIAGNOSIS — R928 Other abnormal and inconclusive findings on diagnostic imaging of breast: Secondary | ICD-10-CM

## 2020-04-04 ENCOUNTER — Telehealth: Payer: Self-pay | Admitting: Gastroenterology

## 2020-04-04 NOTE — Telephone Encounter (Signed)
Pt is requesting a refill on her omeprazole and a preauthorization.  Pharmacy: [facility] [address]

## 2020-04-05 ENCOUNTER — Other Ambulatory Visit: Payer: Self-pay

## 2020-04-05 MED ORDER — OMEPRAZOLE 40 MG PO CPDR: 40.0000 mg | DELAYED_RELEASE_CAPSULE | Freq: Two times a day (BID) | ORAL | 0 refills | Status: DC

## 2020-04-21 ENCOUNTER — Ambulatory Visit: Payer: Managed Care, Other (non HMO) | Admitting: Gastroenterology

## 2020-04-21 ENCOUNTER — Encounter: Payer: Self-pay | Admitting: Gastroenterology

## 2020-04-21 VITALS — BP 124/68 | HR 76 | Ht 66.0 in | Wt 240.0 lb

## 2020-04-21 DIAGNOSIS — R053 Chronic cough: Secondary | ICD-10-CM | POA: Diagnosis not present

## 2020-04-21 DIAGNOSIS — K219 Gastro-esophageal reflux disease without esophagitis: Secondary | ICD-10-CM | POA: Diagnosis not present

## 2020-04-21 MED ORDER — OMEPRAZOLE 40 MG PO CPDR: 40.0000 mg | DELAYED_RELEASE_CAPSULE | Freq: Two times a day (BID) | ORAL | 0 refills | Status: DC

## 2020-04-21 MED ORDER — OMEPRAZOLE 40 MG PO CPDR: 40.0000 mg | DELAYED_RELEASE_CAPSULE | Freq: Every day | ORAL | 4 refills | Status: DC

## 2020-04-21 NOTE — Patient Instructions (Signed)
If you are age 51 or older, your body mass index should be between 23-30. Your Body mass index is 38.74 kg/m. If this is out of the aforementioned range listed, please consider follow up with your Primary Care Provider.  If you are age 98 or younger, your body mass index should be between 19-25. Your Body mass index is 38.74 kg/m. If this is out of the aformentioned range listed, please consider follow up with your Primary Care Provider.   We have sent the following medications to your pharmacy for you to pick up at your convenience: Omeprazole.

## 2020-04-21 NOTE — Progress Notes (Signed)
04/21/2020 Norma [name] [id number] May 06, 1970   HISTORY OF PRESENT ILLNESS: This is a 50 year old female who is known to Dr. Hilarie Fredrickson for treatment of her GERD and for her colonoscopies.  She is currently on omeprazole 40 mg daily and needs the prescription renewed, which is the primary reason for this visit.  While she is here she also mentions having a cough again.  She was seen by me almost 3 years ago with similar complaints and her PPI was increased to twice daily.  After some time her cough resolved and she went back to taking daily dosing.  She tells me that around that same time she had also started using an inhaler to try to treat the cough as well so was unsure which of those had actually helped resolve her symptoms.  Nonetheless, she just got an inhaler again from her PCP last week so has been using that to see if it will make any difference in her symptoms.  Her last EGD was in September 2017 at which time it was normal except for a single gastric polyp that was hyperplastic on pathology.  She also mentioned that she had made note of some left upper quadrant abdominal pain and epigastric abdominal pain that she had had for a decent amount of time, but has not been present now since probably in the early fall.  She said that they lasted long enough that she made note of them in her phone and so wanted to make [place] aware, but once again she is not having the symptoms currently and has not for a long time.  Her last colonoscopy was in September 2017 at which time she was found to have internal hemorrhoids and 3 polyps with the largest being 5 mm in size that were removed.   Two were tubular adenomas and one was benign polypoid mucosa.   Past Medical History:  Diagnosis Date  . Anemia   . Anxiety   . Arthritis    KNEES  . Chronic headache   . Colon polyps    TUBULAR ADENOMAS AND HYPERPLASTIC   . DDD (degenerative disc disease), thoracic   . Depression   . Diabetes mellitus, type 2  ([place])   . Fibromyalgia   . GERD (gastroesophageal reflux disease)   . Goiter   . Hyperlipidemia   . Hypertension   . Low back pain   . Obesity   . RA (rheumatoid arthritis) ([place])   . Vitamin D deficiency    Past Surgical History:  Procedure Laterality Date  . BACK SURGERY  2005   lumbar laminectomy  . BARTHOLIN GLAND CYST EXCISION    . COLONOSCOPY    . LUMBAR LAMINECTOMY/DECOMPRESSION MICRODISCECTOMY Right 07/17/2013   Procedure: LUMBAR LAMINECTOMY/DECOMPRESSION MICRODISCECTOMY 1 LEVEL,RIGHT LUMBAR FOUR-FIVE;  Surgeon: Faythe Ghee, MD;  Location: [facility];  Service: Neurosurgery;  Laterality: Right;  Right  . PARTIAL HYSTERECTOMY    . POLYPECTOMY      reports that she has never smoked. She has never used smokeless tobacco. She reports current alcohol use. She reports that she does not use drugs. family history includes Aneurysm in her mother; Colon polyps in her maternal uncle; Diabetes in her father and mother; Liver disease in her maternal grandmother; Parkinson's disease in her paternal grandmother; Rheum arthritis in her maternal grandmother; Stroke in her maternal grandfather. Allergies  Allergen Reactions  . Other     She believes Tizanidine caused nightmares and she did not tolerate the  medication.      Outpatient Encounter Medications as of 04/21/2020  Medication Sig  . buPROPion (WELLBUTRIN XL) 150 MG 24 hr tablet Take 300 mg by mouth daily.   . celecoxib (CELEBREX) 200 MG capsule Take 200 mg by mouth as needed.   . ezetimibe (ZETIA) 10 MG tablet Take 10 mg by mouth daily.  [name] Kitchen omeprazole (PRILOSEC) 40 MG capsule Take 1 capsule (40 mg total) by mouth 2 (two) times daily. (Patient taking differently: Take 40 mg by mouth daily.)  . polyethylene glycol powder (GLYCOLAX/MIRALAX) powder Dissolve 17 grams in at least 8 ounces water/juice and drink once daily. (Patient taking differently: as needed. Dissolve 17 grams in at least 8 ounces water/juice and drink once daily.)   . telmisartan (MICARDIS) 80 MG tablet Take 80 mg by mouth daily.  . [DISCONTINUED] diclofenac sodium (VOLTAREN) 1 % GEL Apply 4 g topically 4 (four) times daily. (Patient taking differently: Apply 4 g topically as needed. )  . [DISCONTINUED] pregabalin (LYRICA) 25 MG capsule Take 1 capsule by mouth 2 (two) times a day.  . [DISCONTINUED] Semaglutide,0.25 or 0.5MG /DOS, (OZEMPIC, 0.25 OR 0.5 MG/DOSE,) 2 MG/1.5ML SOPN Inject 1 mg into the skin once a week.   No facility-administered encounter medications on file as of 04/21/2020.     REVIEW OF SYSTEMS  : All other systems reviewed and negative except where noted in the History of Present Illness.   PHYSICAL EXAM: BP 124/68   Pulse 76   Ht 5\' 6"  (5.697 m)   Wt 240 lb (108.9 kg)   BMI 38.74 kg/m  General: Well developed AA female in no acute distress Head: Normocephalic and atraumatic Eyes:  Sclerae anicteric, conjunctiva pink. Ears: Normal auditory acuity Lungs: Clear throughout to auscultation; no W/R/R. Heart: Regular rate and rhythm; no M/R/G. Abdomen: Soft, non-distended.  BS present.  Non-tender. Musculoskeletal: Symmetrical with no gross deformities  Skin: No lesions on visible extremities Extremities: No edema  Neurological: Alert oriented x 4, grossly non-focal Psychological:  Alert and cooperative. Normal mood and affect  ASSESSMENT AND PLAN: *Chronic GERD: Has recurrent cough.  Had the same issue a few years ago and her PPI was increased to twice daily and the cough got better.  She says that she thinks she also had used an inhaler for a while at that time as well.  She started using an inhaler this round last week so is going to see how it works.  I once again discussed increasing her PPI to twice daily for 4 weeks or so to see if that makes any difference again if no improvement with the inhaler.  New prescription was sent to pharmacy. *History of adenomatous colon polyps: Is due for colonoscopy September 2022.   CC:   Simona Huh, NP

## 2020-04-30 NOTE — Progress Notes (Signed)
Addendum: Reviewed and agree with assessment and management plan. Rayford Williamsen M, MD  

## 2020-08-11 ENCOUNTER — Telehealth: Payer: Self-pay | Admitting: Internal Medicine

## 2020-08-11 NOTE — Telephone Encounter (Signed)
Inbound call from pt stating she having some reflux issues and wanted some advice to manage it. Please give her a call. Thank you

## 2020-08-12 NOTE — Telephone Encounter (Signed)
Left message on machine to call back  

## 2020-08-12 NOTE — Telephone Encounter (Signed)
The pt did increase PPI twice daily.  She would like to proceed with EGD COLON in September.  She states she would like to call on Monday to set that appt up.

## 2020-08-12 NOTE — Telephone Encounter (Signed)
The pt is due for colon in September. She has reflux problems and would like to know if she can have EGD as the same time. Please advise

## 2020-08-15 ENCOUNTER — Encounter: Payer: Self-pay | Admitting: Internal Medicine

## 2020-11-11 ENCOUNTER — Telehealth: Payer: Self-pay

## 2020-11-11 ENCOUNTER — Other Ambulatory Visit: Payer: Self-pay

## 2020-11-11 ENCOUNTER — Ambulatory Visit (AMBULATORY_SURGERY_CENTER): Payer: Self-pay

## 2020-11-11 VITALS — Ht 66.0 in | Wt 255.0 lb

## 2020-11-11 DIAGNOSIS — R053 Chronic cough: Secondary | ICD-10-CM

## 2020-11-11 DIAGNOSIS — K219 Gastro-esophageal reflux disease without esophagitis: Secondary | ICD-10-CM

## 2020-11-11 DIAGNOSIS — Z1211 Encounter for screening for malignant neoplasm of colon: Secondary | ICD-10-CM

## 2020-11-11 DIAGNOSIS — Z86010 Personal history of colonic polyps: Secondary | ICD-10-CM

## 2020-11-11 MED ORDER — SUTAB 1479-225-188 MG PO TABS: 1.0000 | ORAL_TABLET | ORAL | 0 refills | Status: DC

## 2020-11-11 NOTE — Progress Notes (Signed)
No egg or soy allergy known to patient  No issues with past sedation with any surgeries or procedures Patient reports being told they had issues or difficulty with intubation ------TE sent to [facility]. [facility]-concerning patient reporting she is a "difficult airway"; --patient advised her procedure may be changed to [facility] per protocol for "difficult airway"; patient verbalized understanding of information;  No FH of Malignant Hyperthermia No diet pills per patient No home 02 use per patient  No blood thinners per patient  Pt reports issues with constipation -- takes Miralax daily PRN for assistance No A fib or A flutter  EMMI video via [place] 19 guidelines implemented in PV today with Pt and RN   Pt is fully vaccinated for Covid x 2;  Coupon given to pt in PV today, coupon code sent to the pharmacy and NO PA's for preps discussed with pt in PV today  Discussed with pt there will be an out-of-pocket cost for prep and that varies from $0 to 70 +  dollars   Due to the COVID-19 pandemic we are asking patients to follow certain guidelines.  Pt aware of COVID protocols and [facility] guidelines   Prep changed to Sutab per patient adamant request; RX sent

## 2020-11-11 NOTE — Telephone Encounter (Signed)
Please see previous documentation- patient is already aware that procedure may need to be moved to [facility] as she completed her pre visit on 11/11/2020- please notify patient of [facility] date and provide revised instructions-- thank you

## 2020-11-11 NOTE — Telephone Encounter (Signed)
John,  Please review patient's chart for anesthesia complications;  During pre visit appt, it was found in the medical record and per patient report that she is a "difficult airway";  Patient was advised that her procedure may have to be moved to the hospital and she verbalized understanding;  Please advise on this [facility] you

## 2020-11-11 NOTE — Telephone Encounter (Signed)
Appointment for Colonoscopy and EGD has been made for November 15th at 9:30 at [facility]. Pt made aware. Case Number [id number] Please contact pt with updated instructions

## 2020-11-17 NOTE — Telephone Encounter (Signed)
See additional documentation- however, patient reports she may call back and need to R/S due to work -she is not sure she is going to keep this appt

## 2020-11-17 NOTE — Telephone Encounter (Signed)
Noted  

## 2020-11-17 NOTE — Progress Notes (Signed)
Revised instructions being sent to patient as procedure was moved to [facility] per provider request; Patient called back to the office-informed of change in instructions

## 2020-11-17 NOTE — Telephone Encounter (Signed)
Attempted to reach patient to advise of revised instructions being in MyChart; left message on VM;

## 2020-11-25 ENCOUNTER — Encounter: Payer: Managed Care, Other (non HMO) | Admitting: Internal Medicine

## 2020-12-23 ENCOUNTER — Encounter: Payer: Self-pay | Admitting: Internal Medicine

## 2020-12-26 ENCOUNTER — Telehealth: Payer: Self-pay | Admitting: Gastroenterology

## 2020-12-26 NOTE — Telephone Encounter (Signed)
Inbound call from patient. With questions about if her procedure have to absolutely be done at hospital? If so, she would need to reschedule because insurance will not cover till she meet her deductible and she states she know she will not meet it and do not have that money at the money. Best contact number [phone]

## 2020-12-26 NOTE — Telephone Encounter (Signed)
Left message on machine to call back  

## 2020-12-27 NOTE — Telephone Encounter (Signed)
The pt states that she is not able to keep appt for colonoscopy due to finances.  She states she will call back to reschedule next year after she has met her deductible.

## 2021-01-24 ENCOUNTER — Encounter (HOSPITAL_COMMUNITY): Payer: Self-pay

## 2021-01-24 ENCOUNTER — Ambulatory Visit (HOSPITAL_COMMUNITY): Admit: 2021-01-24 | Payer: No Typology Code available for payment source | Admitting: Internal Medicine

## 2021-01-24 SURGERY — COLONOSCOPY WITH PROPOFOL
Anesthesia: Monitor Anesthesia Care

## 2021-02-22 ENCOUNTER — Other Ambulatory Visit: Payer: Self-pay | Admitting: Obstetrics and Gynecology

## 2021-02-22 DIAGNOSIS — N6320 Unspecified lump in the left breast, unspecified quadrant: Secondary | ICD-10-CM

## 2021-02-22 DIAGNOSIS — R928 Other abnormal and inconclusive findings on diagnostic imaging of breast: Secondary | ICD-10-CM

## 2021-03-08 ENCOUNTER — Telehealth: Payer: Self-pay | Admitting: Internal Medicine

## 2021-03-08 NOTE — Telephone Encounter (Signed)
Patient called wanting to schedule coloscopy at [facility], please advise.

## 2021-03-08 NOTE — Telephone Encounter (Addendum)
Rescheduled pt for colon/EGD on 06/01/21 at 9am with Dr. Hilarie Fredrickson. Called pt to let her know. Previsit scheduled as a telephone visit for 05/16/21 at 9 am. Pt verbalized understanding and had no other concerns at end of call.   Case ID: [id number].

## 2021-03-24 ENCOUNTER — Ambulatory Visit
Admission: RE | Admit: 2021-03-24 | Discharge: 2021-03-24 | Disposition: A | Discharge status: Home / Self Care | Payer: No Typology Code available for payment source | Source: Ambulatory Visit | Attending: Obstetrics and Gynecology | Admitting: Obstetrics and Gynecology

## 2021-03-24 ENCOUNTER — Other Ambulatory Visit: Payer: Self-pay | Admitting: Obstetrics and Gynecology

## 2021-03-24 ENCOUNTER — Other Ambulatory Visit: Payer: Self-pay

## 2021-03-24 DIAGNOSIS — R928 Other abnormal and inconclusive findings on diagnostic imaging of breast: Secondary | ICD-10-CM

## 2021-04-05 ENCOUNTER — Other Ambulatory Visit: Payer: No Typology Code available for payment source

## 2021-04-26 ENCOUNTER — Other Ambulatory Visit: Payer: Self-pay | Admitting: Gastroenterology

## 2021-05-16 ENCOUNTER — Ambulatory Visit (AMBULATORY_SURGERY_CENTER): Payer: No Typology Code available for payment source | Admitting: *Deleted

## 2021-05-16 ENCOUNTER — Other Ambulatory Visit: Payer: Self-pay

## 2021-05-16 VITALS — Ht 66.0 in | Wt 258.0 lb

## 2021-05-16 DIAGNOSIS — Z86010 Personal history of colonic polyps: Secondary | ICD-10-CM

## 2021-05-16 DIAGNOSIS — K219 Gastro-esophageal reflux disease without esophagitis: Secondary | ICD-10-CM

## 2021-05-16 NOTE — Progress Notes (Signed)
No egg or soy allergy known to patient  ?No issues known to pt with past sedation with any surgeries or procedures ?Patient with  difficulty with intubation  ?No FH of Malignant Hyperthermia ?Pt is not on diet pills ?Pt is not on  home 02  ?Pt is not on blood thinners  ?Pt denies issues with constipation  ?No A fib or A flutter ? ?Pt is fully vaccinated  for Covid  ?Pt has Sutab kit at home from previous scheduled procedure  ?Due to the COVID-19 pandemic we are asking patients to follow certain guidelines in PV and the [place]   ?Pt aware of COVID protocols and [facility] guidelines  ? ?PV completed over the phone. Pt verified name, DOB, address and insurance during PV today.  ?Pt mailed instruction packet with copy of consent form to read and not return, and instructions.  ?Pt encouraged to call with questions or issues.  ?If pt has My chart, procedure instructions sent via My Chart  ? ?

## 2021-05-24 ENCOUNTER — Encounter (HOSPITAL_COMMUNITY): Payer: Self-pay | Admitting: Internal Medicine

## 2021-05-24 NOTE — Progress Notes (Signed)
Attempted to obtain medical history via telephone, unable to reach at this time. Unable to leave a voicemail to return pre surgical testing department's phone call.  ?

## 2021-05-30 ENCOUNTER — Encounter: Payer: Self-pay | Admitting: Internal Medicine

## 2021-06-01 ENCOUNTER — Encounter (HOSPITAL_COMMUNITY): Payer: Self-pay | Admitting: Internal Medicine

## 2021-06-01 ENCOUNTER — Ambulatory Visit (HOSPITAL_COMMUNITY)
Admission: RE | Admit: 2021-06-01 | Discharge: 2021-06-01 | Disposition: A | Discharge status: Home / Self Care | Payer: No Typology Code available for payment source | Attending: Internal Medicine | Admitting: Internal Medicine

## 2021-06-01 ENCOUNTER — Encounter (HOSPITAL_COMMUNITY)
Admission: RE | Disposition: A | Discharge status: Home / Self Care | Payer: Self-pay | Source: Home / Self Care | Attending: Internal Medicine

## 2021-06-01 ENCOUNTER — Ambulatory Visit (HOSPITAL_BASED_OUTPATIENT_CLINIC_OR_DEPARTMENT_OTHER): Payer: No Typology Code available for payment source | Admitting: Anesthesiology

## 2021-06-01 ENCOUNTER — Ambulatory Visit (HOSPITAL_COMMUNITY): Payer: No Typology Code available for payment source | Admitting: Anesthesiology

## 2021-06-01 ENCOUNTER — Other Ambulatory Visit: Payer: Self-pay

## 2021-06-01 DIAGNOSIS — I10 Essential (primary) hypertension: Secondary | ICD-10-CM | POA: Insufficient documentation

## 2021-06-01 DIAGNOSIS — M797 Fibromyalgia: Secondary | ICD-10-CM | POA: Insufficient documentation

## 2021-06-01 DIAGNOSIS — K635 Polyp of colon: Secondary | ICD-10-CM | POA: Diagnosis not present

## 2021-06-01 DIAGNOSIS — K219 Gastro-esophageal reflux disease without esophagitis: Secondary | ICD-10-CM | POA: Insufficient documentation

## 2021-06-01 DIAGNOSIS — D122 Benign neoplasm of ascending colon: Secondary | ICD-10-CM | POA: Diagnosis not present

## 2021-06-01 DIAGNOSIS — J45909 Unspecified asthma, uncomplicated: Secondary | ICD-10-CM | POA: Diagnosis not present

## 2021-06-01 DIAGNOSIS — E119 Type 2 diabetes mellitus without complications: Secondary | ICD-10-CM | POA: Insufficient documentation

## 2021-06-01 DIAGNOSIS — K222 Esophageal obstruction: Secondary | ICD-10-CM | POA: Diagnosis not present

## 2021-06-01 DIAGNOSIS — R1310 Dysphagia, unspecified: Secondary | ICD-10-CM | POA: Insufficient documentation

## 2021-06-01 DIAGNOSIS — K648 Other hemorrhoids: Secondary | ICD-10-CM | POA: Diagnosis not present

## 2021-06-01 DIAGNOSIS — E669 Obesity, unspecified: Secondary | ICD-10-CM | POA: Diagnosis not present

## 2021-06-01 DIAGNOSIS — F32A Depression, unspecified: Secondary | ICD-10-CM | POA: Diagnosis not present

## 2021-06-01 DIAGNOSIS — Z1211 Encounter for screening for malignant neoplasm of colon: Secondary | ICD-10-CM | POA: Diagnosis not present

## 2021-06-01 DIAGNOSIS — R1319 Other dysphagia: Secondary | ICD-10-CM

## 2021-06-01 DIAGNOSIS — Z6841 Body Mass Index (BMI) 40.0 and over, adult: Secondary | ICD-10-CM | POA: Diagnosis not present

## 2021-06-01 DIAGNOSIS — F419 Anxiety disorder, unspecified: Secondary | ICD-10-CM | POA: Insufficient documentation

## 2021-06-01 DIAGNOSIS — K2970 Gastritis, unspecified, without bleeding: Secondary | ICD-10-CM | POA: Diagnosis not present

## 2021-06-01 DIAGNOSIS — Z86010 Personal history of colonic polyps: Secondary | ICD-10-CM | POA: Diagnosis not present

## 2021-06-01 HISTORY — PX: ESOPHAGOGASTRODUODENOSCOPY (EGD) WITH PROPOFOL: SHX5813

## 2021-06-01 HISTORY — PX: POLYPECTOMY: SHX5525

## 2021-06-01 HISTORY — PX: BIOPSY: SHX5522

## 2021-06-01 HISTORY — PX: BALLOON DILATION: SHX5330

## 2021-06-01 HISTORY — PX: COLONOSCOPY WITH PROPOFOL: SHX5780

## 2021-06-01 SURGERY — COLONOSCOPY WITH PROPOFOL
Anesthesia: Monitor Anesthesia Care

## 2021-06-01 MED ORDER — ONDANSETRON HCL 4 MG/2ML IJ SOLN: 4.0000 mg | Freq: Once | INTRAMUSCULAR | Status: DC | PRN

## 2021-06-01 MED ORDER — SODIUM CHLORIDE 0.9 % IV SOLN: INTRAVENOUS | Status: DC

## 2021-06-01 MED ORDER — PROPOFOL 500 MG/50ML IV EMUL
INTRAVENOUS | Status: DC | PRN
  Administered 2021-06-01: 125 ug/kg/min via INTRAVENOUS

## 2021-06-01 MED ORDER — AMISULPRIDE (ANTIEMETIC) 5 MG/2ML IV SOLN
10.0000 mg | Freq: Once | INTRAVENOUS | Status: DC | PRN
  Filled 2021-06-01: qty 4

## 2021-06-01 MED ORDER — LACTATED RINGERS IV SOLN: INTRAVENOUS | Status: DC | PRN

## 2021-06-01 MED ORDER — PROPOFOL 10 MG/ML IV BOLUS
INTRAVENOUS | Status: DC | PRN
  Administered 2021-06-01 (×2): 40 mg via INTRAVENOUS

## 2021-06-01 MED ORDER — LIDOCAINE 2% (20 MG/ML) 5 ML SYRINGE
INTRAMUSCULAR | Status: DC | PRN
  Administered 2021-06-01: 40 mg via INTRAVENOUS

## 2021-06-01 SURGICAL SUPPLY — 25 items

## 2021-06-01 NOTE — Discharge Instructions (Signed)
YOU HAD AN ENDOSCOPIC PROCEDURE TODAY: Refer to the procedure report and other information in the discharge instructions given to you for any specific questions about what was found during the examination. If this information does not answer your questions, please call [place] office at [phone] to clarify.  ° °YOU SHOULD EXPECT: Some feelings of bloating in the abdomen. Passage of more gas than usual. Walking can help get rid of the air that was put into your GI tract during the procedure and reduce the bloating. If you had a lower endoscopy (such as a colonoscopy or flexible sigmoidoscopy) you may notice spotting of blood in your stool or on the toilet paper. Some abdominal soreness may be present for a day or two, also. ° °DIET: Your first meal following the procedure should be a light meal and then it is ok to progress to your normal diet. A half-sandwich or bowl of soup is an example of a good first meal. Heavy or fried foods are harder to digest and may make you feel nauseous or bloated. Drink plenty of fluids but you should avoid alcoholic beverages for 24 hours. If you had a esophageal dilation, please see attached instructions for diet.   ° °ACTIVITY: Your care partner should take you home directly after the procedure. You should plan to take it easy, moving slowly for the rest of the day. You can resume normal activity the day after the procedure however YOU SHOULD NOT DRIVE, use power tools, machinery or perform tasks that involve climbing or major physical exertion for 24 hours (because of the sedation medicines used during the test).  ° °SYMPTOMS TO REPORT IMMEDIATELY: °A gastroenterologist can be reached at any hour. Please call [phone]  for any of the following symptoms:  °Following lower endoscopy (colonoscopy, flexible sigmoidoscopy) °Excessive amounts of blood in the stool  °Significant tenderness, worsening of abdominal pains  °Swelling of the abdomen that is new, acute  °Fever of 100° or  higher  °Following upper endoscopy (EGD, EUS, ERCP, esophageal dilation) °Vomiting of blood or coffee ground material  °New, significant abdominal pain  °New, significant chest pain or pain under the shoulder blades  °Painful or persistently difficult swallowing  °New shortness of breath  °Black, tarry-looking or red, bloody stools ° °FOLLOW UP:  °If any biopsies were taken you will be contacted by phone or by letter within the next 1-3 weeks. Call [phone]  if you have not heard about the biopsies in 3 weeks.  °Please also call with any specific questions about appointments or follow up tests. ° °

## 2021-06-01 NOTE — Transfer of Care (Signed)
Immediate Anesthesia Transfer of Care Note ? ?Patient: Norma [name] ? ?Procedure(s) Performed: COLONOSCOPY WITH PROPOFOL ?ESOPHAGOGASTRODUODENOSCOPY (EGD) WITH PROPOFOL ?BIOPSY ?BALLOON DILATION ?POLYPECTOMY ? ?Patient Location: PACU and Endoscopy Unit ? ?Anesthesia Type:MAC ? ?Level of Consciousness: awake and alert  ? ?Airway & Oxygen Therapy: Patient Spontanous Breathing and Patient connected to face mask oxygen ? ?Post-op Assessment: Report given to RN and Post -op Vital signs reviewed and stable ? ?Post vital signs: Reviewed and stable ? ?Last Vitals:  ?Vitals Value Taken Time  ?BP 160/96 06/01/21 1004  ?Temp 36.7 ?C 06/01/21 1004  ?Pulse 81 06/01/21 1005  ?Resp 19 06/01/21 1005  ?SpO2 98 % 06/01/21 1005  ?Vitals shown include unvalidated device data. ? ?Last Pain:  ?Vitals:  ? 06/01/21 1004  ?TempSrc: Tympanic  ?PainSc: 0-No pain  ?   ? ?  ? ?Complications: No notable events documented. ?

## 2021-06-01 NOTE — H&P (Signed)
? ?GASTROENTEROLOGY PROCEDURE H&P NOTE  ? ?Primary Care Physician: ?Curt Jews, PA-C ? ? ? ?Reason for Procedure:  Longstanding GERD, intermittent esophageal dysphagia and history of colon polyps ? ?Plan:    EGD with possible dilation, colonoscopy ? ?The nature of the procedure, as well as the risks, benefits, and alternatives were carefully and thoroughly reviewed with the patient. Ample time for discussion and questions allowed. The patient understood, was satisfied, and agreed to proceed.  ? ? ? ?HPI: ?Norma [name] is a 51 y.o. female who presents for EGD and colonoscopy.  Medical history as below.  Tolerated the prep.  No recent chest pain or shortness of breath.  No abdominal pain today. ? ?Patient last seen in the office about a year ago.  Reports GERD symptoms mostly controlled on twice daily PPI.  Intermittent solid food dysphagia for foods like rice.  Large pills can be an issue as well.  No odynophagia. ? ?Past Medical History:  ?Diagnosis Date  ? Allergy   ? Anemia   ? hx of  ? Anxiety   ? Arthritis   ? KNEES  ? Asthma   ? treated for during COVID tx  ? Chronic headache   ? Colon polyps   ? TUBULAR ADENOMAS AND HYPERPLASTIC   ? DDD (degenerative disc disease), thoracic   ? Depression   ? Diabetes mellitus, type 2 ([place])   ? Difficult airway for intubation   ? Fibromyalgia   ? GERD (gastroesophageal reflux disease)   ? Goiter   ? Hyperlipidemia   ? Hypertension   ? Low back pain   ? Obesity   ? RA (rheumatoid arthritis) ([place])   ? Seasonal allergies   ? Vitamin D deficiency   ? ? ?Past Surgical History:  ?Procedure Laterality Date  ? BACK SURGERY  2005  ? lumbar laminectomy  ? BARTHOLIN GLAND CYST EXCISION    ? COLONOSCOPY  2017  ? JMP-MAC-prep good-TA -recall 5 yr  ? LUMBAR LAMINECTOMY/DECOMPRESSION MICRODISCECTOMY Right 07/17/2013  ? Procedure: LUMBAR LAMINECTOMY/DECOMPRESSION MICRODISCECTOMY 1 LEVEL,RIGHT LUMBAR FOUR-FIVE;  Surgeon: Faythe Ghee, MD;  Location: [facility];  Service:  Neurosurgery;  Laterality: Right;  Right  ? PARTIAL HYSTERECTOMY    ? POLYPECTOMY  2017  ? TA and benign polypoid  ? WISDOM TOOTH EXTRACTION    ? ? ?Prior to Admission medications   ?Medication Sig Start Date End Date Taking? Authorizing Provider  ?ASHWAGANDHA PO Take 600 mg by mouth daily. sassron   Yes [provider]  ?aspirin 81 MG chewable tablet Chew by mouth daily.   Yes [provider]  ?buPROPion (WELLBUTRIN XL) 150 MG 24 hr tablet Take 150 mg by mouth daily.   Yes [provider]  ?celecoxib (CELEBREX) 200 MG capsule Take 200 mg by mouth daily. 09/03/18  Yes [provider]  ?ELDERBERRY PO Take 1 tablet by mouth daily.   Yes [provider]  ?ezetimibe (ZETIA) 10 MG tablet Take 10 mg by mouth daily.   Yes [provider]  ?fluticasone (FLONASE) 50 MCG/ACT nasal spray Place 2 sprays into both nostrils daily as needed for allergies or rhinitis.   Yes [provider]  ?glipiZIDE (GLUCOTROL XL) 5 MG 24 hr tablet Take 5 mg by mouth daily. 05/25/21  Yes [provider]  ?montelukast (SINGULAIR) 10 MG tablet Take 10 mg by mouth at bedtime. 10/13/20  Yes [provider]  ?omeprazole (PRILOSEC) 40 MG capsule TAKE 1 CAPSULE EVERY DAY 04/26/21  Yes Zehr, Laban Emperor, PA-C  ?polyethylene glycol powder (GLYCOLAX/MIRALAX) powder Dissolve 17 grams in at least 8 ounces water/juice and drink once daily. ?Patient taking differently: Take by mouth daily as needed. Dissolve 17 grams in at least 8 ounces water/juice and drink once daily. 10/26/15  Yes Keirra Zeimet, Lajuan Lines, MD  ?Probiotic Product (PROBIOTIC DAILY PO) Take 1 tablet by mouth daily. [facility]   Yes [provider]  ?Rhubarb (ESTROVEN COMPLETE PO) Take 1 tablet by mouth daily.   Yes [provider]  ?Sodium Sulfate-Mag Sulfate-KCl (SUTAB) [phone] MG TABS Take 1 kit by mouth as directed. MANUFACTURER CODES!! BIN: [id number] PCN: CN GROUP: [id number] MEMBER ID: [id number];[employer]  [employer]ANCE ;NO PRIOR AUTHORIZATION 11/11/20  Yes Hillel Card, Lajuan Lines, MD  ?telmisartan (MICARDIS) 80 MG tablet Take 80 mg by mouth daily.   Yes [provider]  ?valACYclovir (VALTREX) 500 MG tablet Take 1 tablet by mouth daily as needed (Flair up).   Yes [provider]  ? ? ?Current Facility-Administered Medications  ?Medication Dose Route Frequency Provider Last Rate Last Admin  ? 0.9 %  sodium chloride infusion   Intravenous Continuous Rondall Radigan, Lajuan Lines, MD      ? ? ?Allergies as of 03/08/2021 - Review Complete 11/11/2020  ?Allergen Reaction Noted  ? Empagliflozin Other (See Comments) 11/13/2019  ? Metformin Nausea Only 05/22/2019  ? Semaglutide Other (See Comments) 05/22/2019  ? Tizanidine Other (See Comments) 11/11/2020  ? ? ?Family History  ?Problem Relation Age of Onset  ? Diabetes Mother   ? Aneurysm Mother   ? Diabetes Father   ? Colon polyps Maternal Uncle   ? Liver disease Maternal Grandmother   ? Rheum arthritis Maternal Grandmother   ? Stroke Maternal Grandfather   ? Parkinson's disease Paternal Grandmother   ? Esophageal cancer Neg Hx   ? Colon cancer Neg Hx   ? Rectal cancer Neg Hx   ? Stomach cancer Neg Hx   ? ? ?Social History  ? ?Socioeconomic History  ? Marital status: Single  ?  Spouse name: Not on file  ? Number of children: 1  ? Years of education: Not on file  ? Highest education level: Some college, no degree  ?Occupational History  ? Occupation: Human resources officer  ?  Employer: [employer]  ?Tobacco Use  ? Smoking status: Never  ? Smokeless tobacco: Never  ?Vaping Use  ? Vaping Use: Never used  ?Substance and Sexual Activity  ? Alcohol use: Not Currently  ? Drug use: Never  ? Sexual activity: Not on file  ?Other Topics Concern  ? Not on file  ?Social History Narrative  ? Lives at home. Her son lives with her.   ? Right handed  ? Caffeine: few times a year  ? ?Social Determinants of Health  ? ?Financial Resource Strain: Not on file  ?Food Insecurity: Not on file  ?Transportation Needs:  Not on file  ?Physical Activity: Not on file  ?Stress: Not on file  ?Social Connections: Not on file  ?Intimate Partner Violence: Not on file  ? ? ?Physical Exam: ?Vital signs in last 24 hours: ?_0  (!) 148/75   Pulse 86   Temp 98.1 ?F (36.7 ?C) (Temporal)   Resp (!) 21   Ht _1  (1.676 m)   Wt 117 kg   SpO2 96%   BMI 41.64 kg/m?  ?GEN: NAD ?EYE: Sclerae anicteric ?ENT: MMM ?CV: Non-tachycardic ?Pulm: CTA b/l ?GI: Soft, NT/ND ?NEURO:  Alert & Oriented x 3 ? ? ?  Zenovia Jarred, MD ?[facility] ? ?06/01/2021 9:00 AM ? ? ?

## 2021-06-01 NOTE — Anesthesia Procedure Notes (Signed)
Procedure Name: [place] ?Date/Time: 06/01/2021 9:16 AM ?Performed by: Cynda Familia, CRNA ?Pre-anesthesia Checklist: Emergency Drugs available, Suction available, Patient being monitored and Timeout performed ?Oxygen Delivery Method: Simple face mask ?Placement Confirmation: positive ETCO2 and breath sounds checked- equal and bilateral ?Dental Injury: Teeth and Oropharynx as per pre-operative assessment  ? ? ? ? ?

## 2021-06-01 NOTE — Op Note (Signed)
[facility] ?Patient Name: Norma [name] ?Procedure Date: 06/01/2021 ?MRN: [id number] ?Attending MD: Jerene Bears , MD ?Date of Birth: 1970/07/30 ?CSN: [id number] ?Age: 51 ?Admit Type: Outpatient ?Procedure:                Colonoscopy ?Indications:              High risk colon cancer surveillance: Personal  ?                          history of non-advanced adenomas, Last colonoscopy:  ?                          September 2017; (3 adenomas 2014, 2 adenomas 2017) ?Providers:                Lajuan Lines. Hilarie Fredrickson, MD, Ladoris Gene, RN, Jacquelin Hawking  ?                          Houle, Technician ?Referring MD:             Leda Gauze. Justin Mend ?Medicines:                Monitored Anesthesia Care ?Complications:            No immediate complications. ?Estimated Blood Loss:     Estimated blood loss was minimal. ?Procedure:                Pre-Anesthesia Assessment: ?                          - Prior to the procedure, a History and Physical  ?                          was performed, and patient medications and  ?                          allergies were reviewed. The patient's tolerance of  ?                          previous anesthesia was also reviewed. The risks  ?                          and benefits of the procedure and the sedation  ?                          options and risks were discussed with the patient.  ?                          All questions were answered, and informed consent  ?                          was obtained. Prior Anticoagulants: The patient has  ?                          taken no previous anticoagulant or antiplatelet  ?  agents. ASA Grade Assessment: III - A patient with  ?                          severe systemic disease. After reviewing the risks  ?                          and benefits, the patient was deemed in  ?                          satisfactory condition to undergo the procedure. ?                          - Prior to the procedure, a History and Physical  ?                           was performed, and patient medications and  ?                          allergies were reviewed. The patient's tolerance of  ?                          previous anesthesia was also reviewed. The risks  ?                          and benefits of the procedure and the sedation  ?                          options and risks were discussed with the patient.  ?                          All questions were answered, and informed consent  ?                          was obtained. Prior Anticoagulants: The patient has  ?                          taken no previous anticoagulant or antiplatelet  ?                          agents. ASA Grade Assessment: III - A patient with  ?                          severe systemic disease. After reviewing the risks  ?                          and benefits, the patient was deemed in  ?                          satisfactory condition to undergo the procedure. ?                          After obtaining informed consent, the colonoscope  ?  was passed under direct vision. Throughout the  ?                          procedure, the patient's blood pressure, pulse, and  ?                          oxygen saturations were monitored continuously. The  ?                          CF-HQ190L ([id number]) Olympus colonoscope was  ?                          introduced through the anus and advanced to the  ?                          cecum, identified by appendiceal orifice and  ?                          ileocecal valve. The colonoscopy was performed  ?                          without difficulty. The patient tolerated the  ?                          procedure well. The quality of the bowel  ?                          preparation was good. The ileocecal valve,  ?                          appendiceal orifice, and rectum were photographed. ?Scope In: 1:02:58 AM ?Scope Out: 9:53:32 AM ?Scope Withdrawal Time: 0 hours 13 minutes 51 seconds  ?Total Procedure Duration: 0 hours 16  minutes 38 seconds  ?Findings: ?     The digital rectal exam was normal. ?     A 3 mm polyp was found in the ascending colon. The polyp was sessile.  ?     The polyp was removed with a cold snare. Resection and retrieval were  ?     complete. ?     Internal hemorrhoids were found during retroflexion. The hemorrhoids  ?     were small. ?     The exam was otherwise without abnormality. ?Impression:               - One 3 mm polyp in the ascending colon, removed  ?                          with a cold snare. Resected and retrieved. ?                          - Small internal hemorrhoids. ?                          - The examination was otherwise normal. ?Moderate Sedation: ?     N/A ?Recommendation:           - Patient has  a contact number available for  ?                          emergencies. The signs and symptoms of potential  ?                          delayed complications were discussed with the  ?                          patient. Return to normal activities tomorrow.  ?                          Written discharge instructions were provided to the  ?                          patient. ?                          - Resume previous diet. ?                          - Continue present medications. ?                          - Await pathology results. ?                          - Repeat colonoscopy is recommended for  ?                          surveillance. The colonoscopy date will be  ?                          determined after pathology results from today's  ?                          exam become available for review. ?Procedure Code(s):        --- Professional --- ?                          [id number], Colonoscopy, flexible; with removal of  ?                          tumor(s), polyp(s), or other lesion(s) by snare  ?                          technique ?Diagnosis Code(s):        --- Professional --- ?                          Z86.010, Personal history of colonic polyps ?                          K63.5, Polyp of colon ?                           K64.8, Other hemorrhoids ?CPT copyright 2019  American Medical Association. All rights reserved. ?The codes documented in this report are preliminary and upon coder review may  ?be revised to meet current compliance requirements. ?Jerene Bears, MD ?06/01/2021 10:21:09 AM ?This report has been signed electronically. ?Number of Addenda: 0 ?

## 2021-06-01 NOTE — Anesthesia Postprocedure Evaluation (Signed)
Anesthesia Post Note ? ?Patient: Norma [name] ? ?Procedure(s) Performed: COLONOSCOPY WITH PROPOFOL ?ESOPHAGOGASTRODUODENOSCOPY (EGD) WITH PROPOFOL ?BIOPSY ?BALLOON DILATION ?POLYPECTOMY ? ?  ? ?Patient location during evaluation: PACU ?Anesthesia Type: MAC ?Level of consciousness: awake and alert ?Pain management: pain level controlled ?Vital Signs Assessment: post-procedure vital signs reviewed and stable ?Respiratory status: spontaneous breathing, nonlabored ventilation, respiratory function stable and patient connected to nasal cannula oxygen ?Cardiovascular status: stable and blood pressure returned to baseline ?Postop Assessment: no apparent nausea or vomiting ?Anesthetic complications: no ? ? ?No notable events documented. ? ?Last Vitals:  ?Vitals:  ? 06/01/21 1014 06/01/21 1024  ?BP: (!) 150/85 (!) 166/92  ?Pulse: 79 65  ?Resp: 19 20  ?Temp:    ?SpO2: 95% 97%  ?  ?Last Pain:  ?Vitals:  ? 06/01/21 1024  ?TempSrc:   ?PainSc: 0-No pain  ? ? ?  ?  ?  ?  ?  ?  ? ?[name] ? ? ? ? ?

## 2021-06-01 NOTE — Anesthesia Preprocedure Evaluation (Signed)
Anesthesia Evaluation  ?Patient identified by MRN, date of birth, ID band ?Patient awake ? ? ? ?Reviewed: ?Allergy & Precautions, NPO status , Patient's Chart, lab work & pertinent test results ? ?Airway ?Mallampati: III ? ?TM Distance: >3 FB ?Neck ROM: Full ? ? ? Dental ? ?(+) Teeth Intact, Dental Advisory Given ?  ?Pulmonary ?asthma ,  ?  ?breath sounds clear to auscultation ? ? ? ? ? ? Cardiovascular ?hypertension,  ?Rhythm:Regular Rate:Normal ? ? ?  ?Neuro/Psych ? Headaches, PSYCHIATRIC DISORDERS Anxiety Depression   ? GI/Hepatic ?Neg liver ROS, GERD  ,  ?Endo/Other  ?diabetes ? Renal/GU ?negative Renal ROS  ? ?  ?Musculoskeletal ? ?(+) Arthritis , Rheumatoid disorders,  Fibromyalgia - ? Abdominal ?(+) + obese,   ?Peds ? Hematology ?  ?Anesthesia Other Findings ? ? Reproductive/Obstetrics ? ?  ? ? ? ? ? ? ? ? ? ? ? ? ? ?  ?  ? ? ? ? ? ? ? ? ?Anesthesia Physical ?Anesthesia Plan ? ?ASA: 3 ? ?Anesthesia Plan: MAC  ? ?Post-op Pain Management:   ? ?Induction: Intravenous ? ?PONV Risk Score and Plan: 0 and Propofol infusion ? ?Airway Management Planned:  ? ?Additional Equipment: None ? ?Intra-op Plan:  ? ?Post-operative Plan:  ? ?Informed Consent: I have reviewed the patients History and Physical, chart, labs and discussed the procedure including the risks, benefits and alternatives for the proposed anesthesia with the patient or authorized representative who has indicated his/her understanding and acceptance.  ? ? ? ? ? ?Plan Discussed with: CRNA ? ?Anesthesia Plan Comments:   ? ? ? ? ? ? ?Anesthesia Quick Evaluation ? ?

## 2021-06-01 NOTE — Op Note (Signed)
[facility] ?Patient Name: Norma [name] ?Procedure Date: 06/01/2021 ?MRN: [id number] ?Attending MD: Jerene Bears , MD ?Date of Birth: 09/11/1970 ?CSN: [id number] ?Age: 51 ?Admit Type: Outpatient ?Procedure:                Upper GI endoscopy ?Indications:              Dysphagia (intermittent, solid food),  ?                          Gastro-esophageal reflux disease ?Providers:                Lajuan Lines. Hilarie Fredrickson, MD, Ladoris Gene, RN, Jacquelin Hawking  ?                          Houle, Technician ?Referring MD:             Leda Gauze. Justin Mend ?Medicines:                Monitored Anesthesia Care ?Complications:            No immediate complications. ?Estimated Blood Loss:     Estimated blood loss was minimal. ?Procedure:                Pre-Anesthesia Assessment: ?                          - Prior to the procedure, a History and Physical  ?                          was performed, and patient medications and  ?                          allergies were reviewed. The patient's tolerance of  ?                          previous anesthesia was also reviewed. The risks  ?                          and benefits of the procedure and the sedation  ?                          options and risks were discussed with the patient.  ?                          All questions were answered, and informed consent  ?                          was obtained. Prior Anticoagulants: The patient has  ?                          taken no previous anticoagulant or antiplatelet  ?                          agents. ASA Grade Assessment: III - A patient with  ?  severe systemic disease. After reviewing the risks  ?                          and benefits, the patient was deemed in  ?                          satisfactory condition to undergo the procedure. ?                          After obtaining informed consent, the endoscope was  ?                          passed under direct vision. Throughout the  ?                           procedure, the patient's blood pressure, pulse, and  ?                          oxygen saturations were monitored continuously. The  ?                          [id number]-[id number] ([id number]) Olympus endoscope was introduced  ?                          through the mouth, and advanced to the second part  ?                          of duodenum. The upper GI endoscopy was  ?                          accomplished without difficulty. The patient  ?                          tolerated the procedure well. ?Scope In: ?Scope Out: ?Findings: ?     Normal mucosa was found in the entire esophagus. ?     A non-obstructing Schatzki ring was found at the gastroesophageal  ?     junction. A TTS dilator was passed through the scope. Dilation with an  ?     18-19-20 mm balloon dilator was performed to 20 mm. The dilation site  ?     was examined and showed mild mucosal disruption. ?     Mild inflammation characterized by congestion (edema), erosions,  ?     erythema and granularity was found in the gastric body and in the  ?     gastric antrum. Biopsies were taken with a cold forceps for histology  ?     and Helicobacter pylori testing. ?     The examined duodenum was normal. ?Impression:               - Normal mucosa was found in the entire esophagus. ?                          - Non-obstructing Schatzki ring. Dilated. ?                          -  Gastritis. Biopsied. ?                          - Normal examined duodenum. ?Moderate Sedation: ?     N/A ?Recommendation:           - Patient has a contact number available for  ?                          emergencies. The signs and symptoms of potential  ?                          delayed complications were discussed with the  ?                          patient. Return to normal activities tomorrow.  ?                          Written discharge instructions were provided to the  ?                          patient. ?                          - Resume previous diet. ?                          - Continue  present medications. ?                          - Await pathology results. ?Procedure Code(s):        --- Professional --- ?                          [phone], Esophagogastroduodenoscopy, flexible,  ?                          transoral; with transendoscopic balloon dilation of  ?                          esophagus (less than 30 mm diameter) ?                          [id number], 59, Esophagogastroduodenoscopy, flexible,  ?                          transoral; with biopsy, single or multiple ?Diagnosis Code(s):        --- Professional --- ?                          K22.2, Esophageal obstruction ?                          K29.70, Gastritis, unspecified, without bleeding ?                          R13.10, Dysphagia, unspecified ?  K21.9, Gastro-esophageal reflux disease without  ?                          esophagitis ?CPT copyright 2019 American Medical Association. All rights reserved. ?The codes documented in this report are preliminary and upon coder review may  ?be revised to meet current compliance requirements. ?Jerene Bears, MD ?06/01/2021 10:19:06 AM ?This report has been signed electronically. ?Number of Addenda: 0 ?

## 2021-06-02 ENCOUNTER — Encounter (HOSPITAL_COMMUNITY): Payer: Self-pay | Admitting: Internal Medicine

## 2021-06-02 LAB — SURGICAL PATHOLOGY

## 2021-06-05 ENCOUNTER — Encounter: Payer: Self-pay | Admitting: Internal Medicine

## 2021-10-18 ENCOUNTER — Encounter: Payer: Self-pay | Admitting: Nurse Practitioner

## 2021-10-18 ENCOUNTER — Ambulatory Visit: Payer: No Typology Code available for payment source | Attending: Nurse Practitioner | Admitting: Nurse Practitioner

## 2021-10-18 VITALS — BP 149/83 | HR 89 | Temp 98.1°F | Ht 66.0 in | Wt 257.2 lb

## 2021-10-18 DIAGNOSIS — E1165 Type 2 diabetes mellitus with hyperglycemia: Secondary | ICD-10-CM | POA: Diagnosis not present

## 2021-10-18 DIAGNOSIS — E559 Vitamin D deficiency, unspecified: Secondary | ICD-10-CM

## 2021-10-18 DIAGNOSIS — Z8739 Personal history of other diseases of the musculoskeletal system and connective tissue: Secondary | ICD-10-CM

## 2021-10-18 DIAGNOSIS — M2550 Pain in unspecified joint: Secondary | ICD-10-CM

## 2021-10-18 DIAGNOSIS — Z7689 Persons encountering health services in other specified circumstances: Secondary | ICD-10-CM

## 2021-10-18 DIAGNOSIS — M461 Sacroiliitis, not elsewhere classified: Secondary | ICD-10-CM | POA: Insufficient documentation

## 2021-10-18 MED ORDER — PREDNISONE 10 MG PO TABS: ORAL_TABLET | ORAL | 0 refills | Status: DC

## 2021-10-18 NOTE — Progress Notes (Signed)
Pt requesting testing for inflammation Pt also check Vitamin D levels.

## 2021-10-18 NOTE — Progress Notes (Addendum)
Assessment & Plan:  Norma [name] was seen today for new patient (initial visit).  Diagnoses and all orders for this visit:  Encounter to establish care  History of rheumatoid arthritis -     Rheumatoid factor -     Autoimmune Profile -     C-reactive protein -     predniSONE (DELTASONE) 10 MG tablet; Directions for 6 day taper: Day 1: 2 tablets before breakfast, 1 after both lunch & dinner and 2 at bedtime Day 2: 1 tab before breakfast, 1 after both lunch & dinner and 2 at bedtime Day 3: 1 tab at each meal & 1 at bedtime Day 4: 1 tab at breakfast, 1 at lunch, 1 at bedtime Day 5: 1 tab at breakfast & 1 tab at bedtime Day 6: 1 tab at breakfast  Arthralgia, unspecified joint -     Uric Acid     predniSONE (DELTASONE) 10 MG tablet; Directions for 6 day taper: Day 1: 2 tablets before breakfast, 1 after both lunch & dinner and 2 at bedtime Day 2: 1 tab before breakfast, 1 after both lunch & dinner and 2 at bedtime Day 3: 1 tab at each meal & 1 at bedtime Day 4: 1 tab at breakfast, 1 at lunch, 1 at bedtime Day 5: 1 tab at breakfast & 1 tab at bedtime Day 6: 1 tab at breakfast  Controlled type 2 diabetes mellitus with hyperglycemia, without long-term current use of insulin (HCC) -     Hemoglobin A1c -     CMP14+EGFR  Vitamin D deficiency disease -     VITAMIN D 25 Hydroxy (Vit-D Deficiency, Fractures)    Patient has been counseled on age-appropriate routine health concerns for screening and prevention. These are reviewed and up-to-date. Referrals have been placed accordingly. Immunizations are up-to-date or declined.    Subjective:   Chief Complaint  Patient presents with   New Patient (Initial Visit)   HPI Norma [name] 51 y.o. female presents to office today to establish care.  She has a past medical history of Allergy, Anemia, Anxiety, Arthritis, Asthma, Chronic headache, Colon polyps, DDD , thoracic, Depression, Diabetes mellitus, type 2,  Fibromyalgia, GERD, Goiter,  Hyperlipidemia, Hypertension, Low back pain, Obesity, RA, Seasonal allergies, Seasonal depression, and Vitamin D deficiency.   Patient has been counseled on age-appropriate routine health concerns for screening and prevention. These are reviewed and up-to-date. Referrals have been placed accordingly. Immunizations are up-to-date or declined.     MAMMOGRAM: F/U Mammogram is due. Patient is aware and declines f/u for abnormal mammo.  COLONOSCOPY: UTD 05-2021 PAP: 02-22-2021  VIT D DEF Has taken prescription  Vit D in the past but not currently taking any over the counter supplement for vit d deficiency  Joint Pain Endorses a history of fibromyalgia and rheumatoid arthritis however I am unable to find any documentation of a diagnosis of RA or positive RF in her chart. Per chart review from PCP there is only a diagnosis of OA. I do see an elevated CRP but no other lab indicators. States she was being followed by [facility] and Celebrex is the only medication that helped her pain. Was on several rheumatoid arthritis medications that she could not tolerate. Again I do not have any documentation to verify this.   DM 2 Last A1c 7.4 (05-2021). Taking glipizide XL 5 mg daily.   HTN Blood pressure is well controlled. She is currently taking micardis 80 mg daily.  BP Readings from Last 3  Encounters:  10/18/21 (!) 149/83  06/01/21 (!) 166/92  04/21/20 124/68     Review of Systems  Constitutional:  Negative for fever, malaise/fatigue and weight loss.  HENT: Negative.  Negative for nosebleeds.   Eyes: Negative.  Negative for blurred vision, double vision and photophobia.  Respiratory: Negative.  Negative for cough and shortness of breath.   Cardiovascular: Negative.  Negative for chest pain, palpitations and leg swelling.  Gastrointestinal: Negative.  Negative for heartburn, nausea and vomiting.  Musculoskeletal:  Positive for joint pain and myalgias.  Neurological: Negative.  Negative for  dizziness, focal weakness, seizures and headaches.  Psychiatric/Behavioral: Negative.  Negative for suicidal ideas.     Past Medical History:  Diagnosis Date   Allergy    Anemia    hx of   Anxiety    Arthritis    KNEES   Asthma    treated for during COVID tx   Chronic headache    Colon polyps    TUBULAR ADENOMAS AND HYPERPLASTIC    DDD (degenerative disc disease), thoracic    Depression    Diabetes mellitus, type 2 ([place])    Difficult airway for intubation    Fibromyalgia    GERD (gastroesophageal reflux disease)    Goiter    Hyperlipidemia    Hypertension    Low back pain    Obesity    RA (rheumatoid arthritis) (HCC)    Seasonal allergies    Vitamin D deficiency     Past Surgical History:  Procedure Laterality Date   BACK SURGERY  2005   lumbar laminectomy   BALLOON DILATION N/A 06/01/2021   Procedure: BALLOON DILATION;  Surgeon: Jerene Bears, MD;  Location: [facility];  Service: Gastroenterology;  Laterality: N/A;   BARTHOLIN GLAND CYST EXCISION     BIOPSY  06/01/2021   Procedure: BIOPSY;  Surgeon: Jerene Bears, MD;  Location: [facility];  Service: Gastroenterology;;   COLONOSCOPY  2017   JMP-MAC-prep good-TA -recall 5 yr   COLONOSCOPY WITH PROPOFOL N/A 06/01/2021   Procedure: COLONOSCOPY WITH PROPOFOL;  Surgeon: Jerene Bears, MD;  Location: [facility];  Service: Gastroenterology;  Laterality: N/A;   ESOPHAGOGASTRODUODENOSCOPY (EGD) WITH PROPOFOL N/A 06/01/2021   Procedure: ESOPHAGOGASTRODUODENOSCOPY (EGD) WITH PROPOFOL;  Surgeon: Jerene Bears, MD;  Location: [facility];  Service: Gastroenterology;  Laterality: N/A;   LUMBAR LAMINECTOMY/DECOMPRESSION MICRODISCECTOMY Right 07/17/2013   Procedure: LUMBAR LAMINECTOMY/DECOMPRESSION MICRODISCECTOMY 1 LEVEL,RIGHT LUMBAR FOUR-FIVE;  Surgeon: Faythe Ghee, MD;  [facility]: [facility];  Service: Neurosurgery;  Laterality: Right;  Right   PARTIAL HYSTERECTOMY     POLYPECTOMY  2017   TA and benign polypoid    POLYPECTOMY  06/01/2021   Procedure: POLYPECTOMY;  Surgeon: Jerene Bears, MD;  Location: [facility];  Service: Gastroenterology;;   WISDOM TOOTH EXTRACTION      Family History  Problem Relation Age of Onset   Diabetes Mother    Aneurysm Mother    Diabetes Father    Colon polyps Maternal Uncle    Liver disease Maternal Grandmother    Rheum arthritis Maternal Grandmother    Stroke Maternal Grandfather    Parkinson's disease Paternal Grandmother    Esophageal cancer Neg Hx    Colon cancer Neg Hx    Rectal cancer Neg Hx    Stomach cancer Neg Hx     Social History Reviewed with no changes to be made today.   Outpatient Medications Prior to Visit  Medication Sig Dispense Refill   aspirin  81 MG chewable tablet Chew by mouth daily.     buPROPion (WELLBUTRIN XL) 150 MG 24 hr tablet Take 150 mg by mouth daily.     celecoxib (CELEBREX) 200 MG capsule Take 200 mg by mouth daily.     ELDERBERRY PO Take 1 tablet by mouth daily.     ezetimibe (ZETIA) 10 MG tablet Take 10 mg by mouth daily.     fluticasone (FLONASE) 50 MCG/ACT nasal spray Place 2 sprays into both nostrils daily as needed for allergies or rhinitis.     glipiZIDE (GLUCOTROL XL) 5 MG 24 hr tablet Take 5 mg by mouth daily.     montelukast (SINGULAIR) 10 MG tablet Take 10 mg by mouth at bedtime.     omeprazole (PRILOSEC) 40 MG capsule TAKE 1 CAPSULE EVERY DAY 90 capsule 0   polyethylene glycol powder (GLYCOLAX/MIRALAX) powder Dissolve 17 grams in at least 8 ounces water/juice and drink once daily. (Patient taking differently: Take by mouth daily as needed. Dissolve 17 grams in at least 8 ounces water/juice and drink once daily.) 527 g 2   Probiotic Product (PROBIOTIC DAILY PO) Take 1 tablet by mouth daily. Woman's Care     telmisartan (MICARDIS) 80 MG tablet Take 80 mg by mouth daily.     valACYclovir (VALTREX) 500 MG tablet Take 1 tablet by mouth daily as needed (Flair up).     ASHWAGANDHA PO Take 600 mg by mouth daily. sassron  (Patient not taking: Reported on 10/18/2021)     Rhubarb (ESTROVEN COMPLETE PO) Take 1 tablet by mouth daily. (Patient not taking: Reported on 10/18/2021)     No facility-administered medications prior to visit.    Allergies  Allergen Reactions   Empagliflozin Other (See Comments)    Flank pain    Metformin Nausea Only   Semaglutide Other (See Comments)    Headache   Tizanidine Other (See Comments)    Causes nightmares       Objective:    BP (!) 149/83   Pulse 89   Temp 98.1 F (36.7 C) (Oral)   Ht _0  (1.676 m)   Wt 257 lb 3.2 oz (116.7 kg)   SpO2 98%   BMI 41.51 kg/m  Wt Readings from Last 3 Encounters:  10/18/21 257 lb 3.2 oz (116.7 kg)  06/01/21 258 lb (117 kg)  05/16/21 258 lb (117 kg)    Physical Exam Vitals and nursing note reviewed.  Constitutional:      Appearance: She is well-developed.  HENT:     Head: Normocephalic and atraumatic.  Cardiovascular:     Rate and Rhythm: Normal rate and regular rhythm.     Heart sounds: Normal heart sounds. No murmur heard.    No friction rub. No gallop.  Pulmonary:     Effort: Pulmonary effort is normal. No tachypnea or respiratory distress.     Breath sounds: Normal breath sounds. No decreased breath sounds, wheezing, rhonchi or rales.  Chest:     Chest wall: No tenderness.  Abdominal:     General: Bowel sounds are normal.     Palpations: Abdomen is soft.  Musculoskeletal:        General: Normal range of motion.     Cervical back: Normal range of motion.  Skin:    General: Skin is warm and dry.  Neurological:     Mental Status: She is alert and oriented to person, place, and time.     Coordination: Coordination normal.  Psychiatric:  Behavior: Behavior normal. Behavior is cooperative.        Thought Content: Thought content normal.        Judgment: Judgment normal.          Patient has been counseled extensively about nutrition and exercise as well as the importance of adherence with medications  and regular follow-up. The patient was given clear instructions to go to ER or return to medical center if symptoms don't improve, worsen or new problems develop. The patient verbalized understanding.   Follow-up: Return in about 3 months (around 01/18/2022).   Gildardo Pounds, FNP-BC [facility], [place]   10/18/2021, 9:40 PM

## 2021-10-18 NOTE — Patient Instructions (Signed)
Take wellbutrin every other day for 2 weeks  Taper to every 3 days for 2 weeks  Decrease to 75 mg of half a tablet every other day for 2 weeks  Decrease to 75 mg every 3 days for 2 weeks  Decrease to 75 mg once a week for 2 weeks then stop

## 2021-10-19 ENCOUNTER — Encounter: Payer: Self-pay | Admitting: Nurse Practitioner

## 2021-10-19 ENCOUNTER — Other Ambulatory Visit: Payer: Self-pay | Admitting: Nurse Practitioner

## 2021-10-19 LAB — CMP14+EGFR
ALT: 30 IU/L (ref 0–32)
AST: 20 IU/L (ref 0–40)
Albumin/Globulin Ratio: 1.8 (ref 1.2–2.2)
Albumin: 4.6 g/dL (ref 3.8–4.9)
Alkaline Phosphatase: 91 IU/L (ref 44–121)
BUN/Creatinine Ratio: 12 (ref 9–23)
BUN: 13 mg/dL (ref 6–24)
Bilirubin Total: 0.3 mg/dL (ref 0.0–1.2)
CO2: 22 mmol/L (ref 20–29)
Calcium: 9.4 mg/dL (ref 8.7–10.2)
Chloride: 104 mmol/L (ref 96–106)
Creatinine, Ser: 1.13 mg/dL — ABNORMAL HIGH (ref 0.57–1.00)
Globulin, Total: 2.6 g/dL (ref 1.5–4.5)
Glucose: 119 mg/dL — ABNORMAL HIGH (ref 70–99)
Potassium: 4.6 mmol/L (ref 3.5–5.2)
Sodium: 140 mmol/L (ref 134–144)
Total Protein: 7.2 g/dL (ref 6.0–8.5)
eGFR: 59 mL/min/{1.73_m2} — ABNORMAL LOW (ref >59)

## 2021-10-19 LAB — URIC ACID: Uric Acid: 5.7 mg/dL (ref 3.0–7.2)

## 2021-10-19 LAB — C-REACTIVE PROTEIN: CRP: 8 mg/L (ref 0–10)

## 2021-10-19 LAB — AUTOIMMUNE PROFILE
Anti Nuclear Antibody (ANA): NEGATIVE
Complement C3, Serum: 185 mg/dL — ABNORMAL HIGH (ref 82–167)
dsDNA Ab: <1 IU/mL (ref 0–9)

## 2021-10-19 LAB — HEMOGLOBIN A1C
Est. average glucose Bld gHb Est-mCnc: 169 mg/dL
Hgb A1c MFr Bld: 7.5 % — ABNORMAL HIGH (ref 4.8–5.6)

## 2021-10-19 LAB — RHEUMATOID FACTOR: Rheumatoid fact SerPl-aCnc: <10 IU/mL (ref <14.0)

## 2021-10-19 LAB — VITAMIN D 25 HYDROXY (VIT D DEFICIENCY, FRACTURES): Vit D, 25-Hydroxy: 20.6 ng/mL — ABNORMAL LOW (ref 30.0–100.0)

## 2021-10-19 MED ORDER — GLIPIZIDE ER 10 MG PO TB24: 10.0000 mg | ORAL_TABLET | Freq: Every day | ORAL | 1 refills | Status: DC

## 2021-10-23 ENCOUNTER — Encounter: Payer: Self-pay | Admitting: Nurse Practitioner

## 2021-11-28 ENCOUNTER — Ambulatory Visit: Payer: Self-pay | Admitting: *Deleted

## 2021-11-28 NOTE — Telephone Encounter (Signed)
  Chief Complaint: severe pain under right shoulder blade Symptoms: pain comes and goes under right shoulder blade hurts to breath in . Dry cough, sweating at times but patient feels it is from "hot flashes" shortness of breath  Frequency: started this weekend Pertinent Negatives: Patient denies chest pain no difficulty breathing no fever. No dizziness Disposition: '[x]'$ ED /'[]'$ Urgent Care (no appt availability in office) / '[]'$ Appointment(In office/virtual)/ '[]'$  [facility]/ '[]'$ Home Care/ '[x]'$ Refused Recommended Disposition /'[]'$ [facility]/ '[]'$  Follow-up with PCP  Refused recommended ED. Requesting to see PCP. Please advise.   Additional Notes: na  See above refused ED.    Reason for Disposition  [1] SEVERE pain AND [2] not improved 2 hours after pain medicine  Answer Assessment - Initial Assessment Questions 1. ONSET: "When did the pain start?"     This weekend  2. LOCATION: "Where is the pain located?"     Under shoulder blade right side  3. PAIN: "How bad is the pain?" (Scale 1-10; or mild, moderate, severe)   - MILD (1-3): doesn't interfere with normal activities   - MODERATE (4-7): interferes with normal activities (e.g., work or school) or awakens from sleep   - SEVERE (8-10): excruciating pain, unable to do any normal activities, unable to move arm at all due to pain     Severe when taking breath in or sneeze or cough  4. WORK OR EXERCISE: "Has there been any recent work or exercise that involved this part of the body?"     na 5. CAUSE: "What do you think is causing the shoulder pain?"     Not sure  6. OTHER SYMPTOMS: "Do you have any other symptoms?" (e.g., neck pain, swelling, rash, fever, numbness, weakness)     Dry cough comes and goes. Hurts to breath in , "hot flashes" 7. PREGNANCY: "Is there any chance you are pregnant?" "When was your last menstrual period?"     na  Protocols used: Shoulder Pain-A-AH

## 2021-11-29 NOTE — Telephone Encounter (Signed)
Pt states that she will go to UC if pain continues.

## 2021-12-25 ENCOUNTER — Telehealth: Payer: Self-pay | Admitting: Internal Medicine

## 2021-12-25 NOTE — Telephone Encounter (Signed)
Patient called states she is having a lot of GI issues, trouble swallowing and asked to speak with a nurse.

## 2021-12-26 NOTE — Telephone Encounter (Signed)
Pt scheduled to see Mackenzee Becvar PA 01/04/22 at 1:30pm. Pt aware of appt. States she feels like when she eats the food is sitting in her mid chest area after eating. Wonders if she may have hiatal hernia, interested in further testing.

## 2022-01-04 ENCOUNTER — Encounter: Payer: Self-pay | Admitting: Physician Assistant

## 2022-01-04 ENCOUNTER — Other Ambulatory Visit (INDEPENDENT_AMBULATORY_CARE_PROVIDER_SITE_OTHER): Payer: No Typology Code available for payment source

## 2022-01-04 ENCOUNTER — Ambulatory Visit (INDEPENDENT_AMBULATORY_CARE_PROVIDER_SITE_OTHER): Payer: No Typology Code available for payment source | Admitting: Physician Assistant

## 2022-01-04 VITALS — BP 142/70 | HR 100 | Ht 66.0 in | Wt 258.2 lb

## 2022-01-04 DIAGNOSIS — R1013 Epigastric pain: Secondary | ICD-10-CM

## 2022-01-04 DIAGNOSIS — R053 Chronic cough: Secondary | ICD-10-CM

## 2022-01-04 LAB — BASIC METABOLIC PANEL
BUN: 15 mg/dL (ref 6–23)
CO2: 27 mEq/L (ref 19–32)
Calcium: 9.4 mg/dL (ref 8.4–10.5)
Chloride: 104 mEq/L (ref 96–112)
Creatinine, Ser: 1.09 mg/dL (ref 0.40–1.20)
GFR: 58.32 mL/min — ABNORMAL LOW (ref >60.00)
Glucose, Bld: 165 mg/dL — ABNORMAL HIGH (ref 70–99)
Potassium: 4.3 mEq/L (ref 3.5–5.1)
Sodium: 139 mEq/L (ref 135–145)

## 2022-01-04 NOTE — Progress Notes (Signed)
Chief Complaint: Epigastric pain  HPI:  Norma [name] is a  51 y/o AA female, known to Dr. Hilarie Fredrickson,   who was referred to me by Norma Pounds, NP for a complaint of epigastric pain.      04/21/2020 patient seen in clinic by Alonza Bogus for reflux.  At that time was on Omeprazole 40 mg daily and needed a prescription renewal.  She did have recurrent cough.  Her omeprazole was actually increased to twice daily given her ongoing cough.    06/01/2021 colonoscopy with a 3 polyp in the ascending colon and hemorrhoids and otherwise normal.  Pathology showed tubular adenoma.  Repeat recommended in 7 years.    06/01/2021 EGD with a Schatzki's ring that was dilated and gastritis.  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 pylori.    10/18/2021 CMP with a minimally elevated creatinine at 1.13 and glucose of 119 otherwise normal.  CRP normal.  Hemoglobin A1c 7.5.    Today, the patient tells me that she continues with her epigastric discomfort and chronic cough.  She discussed this with Alonza Bogus the last time she was seen in clinic in February of last year, since then underwent EGD as above.  Patient tells me that she is worried that she has a hiatal hernia because a friend of hers had the same exact symptoms and eventually they found a hiatal hernia on MRI.  Patient tells me that most concerning to her is that she can no longer wear a bra given the discomfort of this in her epigastric area and this is occurred over the past 2 to 3 years.  Also has terrible excruciating pain that feels like something gets pinched/squeezed/stuck in her chest when she bends over to tie her shoes so she does not do this anymore.  Also describes ongoing chronic cough.  She tells me some of this is due to her respiratory issues and she is on Symbicort daily for asthma, but will also get exacerbations which her allergist tells her are due  to reflux and sometimes her symptoms get slightly better when she increases her Omeprazole to 40 mg twice a day.  Currently she is able to manage her cough, but is just tired of not knowing exactly what is going on.    Denies fever, chills, weight loss, blood in her stool, change in bowel habits or symptoms that awaken her from sleep.  Past Medical History:  Diagnosis Date   Allergy    Anemia    hx of   Anxiety    Arthritis    KNEES   Asthma    treated for during COVID tx   Chronic headache    Colon polyps    TUBULAR ADENOMAS AND HYPERPLASTIC    DDD (degenerative disc disease), thoracic    Depression    Diabetes mellitus, type 2 ([place])    Difficult airway for intubation    Fibromyalgia    GERD (gastroesophageal reflux disease)    Goiter    Hyperlipidemia    Hypertension    Low back pain    Obesity    RA (rheumatoid arthritis) (HCC)    Seasonal allergies    Vitamin D deficiency     Past Surgical History:  Procedure Laterality Date   BACK SURGERY  2005   lumbar laminectomy   BALLOON DILATION N/A 06/01/2021   Procedure: BALLOON DILATION;  Surgeon: Hilarie Fredrickson,  Lajuan Lines, MD;  Location: [facility];  Service: Gastroenterology;  Laterality: N/A;   BARTHOLIN GLAND CYST EXCISION     BIOPSY  06/01/2021   Procedure: BIOPSY;  Surgeon: Jerene Bears, MD;  Location: [facility];  Service: Gastroenterology;;   COLONOSCOPY  2017   JMP-MAC-prep good-TA -recall 5 yr   COLONOSCOPY WITH PROPOFOL N/A 06/01/2021   Procedure: COLONOSCOPY WITH PROPOFOL;  Surgeon: Jerene Bears, MD;  Location: [facility];  Service: Gastroenterology;  Laterality: N/A;   ESOPHAGOGASTRODUODENOSCOPY (EGD) WITH PROPOFOL N/A 06/01/2021   Procedure: ESOPHAGOGASTRODUODENOSCOPY (EGD) WITH PROPOFOL;  Surgeon: Jerene Bears, MD;  [facility]: [facility];  Service: Gastroenterology;  Laterality: N/A;   LUMBAR LAMINECTOMY/DECOMPRESSION MICRODISCECTOMY Right 07/17/2013   Procedure: LUMBAR LAMINECTOMY/DECOMPRESSION MICRODISCECTOMY 1  LEVEL,RIGHT LUMBAR FOUR-FIVE;  Surgeon: Faythe Ghee, MD;  Location: [facility];  Service: Neurosurgery;  Laterality: Right;  Right   PARTIAL HYSTERECTOMY     POLYPECTOMY  2017   TA and benign polypoid   POLYPECTOMY  06/01/2021   Procedure: POLYPECTOMY;  Surgeon: Jerene Bears, MD;  Location: [facility];  Service: Gastroenterology;;   WISDOM TOOTH EXTRACTION      Current Outpatient Medications  Medication Sig Dispense Refill   aspirin 81 MG chewable tablet Chew by mouth daily.     buPROPion (WELLBUTRIN XL) 150 MG 24 hr tablet Take 150 mg by mouth daily.     celecoxib (CELEBREX) 200 MG capsule Take 200 mg by mouth daily.     ELDERBERRY PO Take 1 tablet by mouth daily.     ezetimibe (ZETIA) 10 MG tablet Take 10 mg by mouth daily.     fluticasone (FLONASE) 50 MCG/ACT nasal spray Place 2 sprays into both nostrils daily as needed for allergies or rhinitis.     glipiZIDE (GLUCOTROL XL) 10 MG 24 hr tablet Take 1 tablet (10 mg total) by mouth daily. 90 tablet 1   montelukast (SINGULAIR) 10 MG tablet Take 10 mg by mouth at bedtime.     omeprazole (PRILOSEC) 40 MG capsule TAKE 1 CAPSULE EVERY DAY 90 capsule 0   polyethylene glycol powder (GLYCOLAX/MIRALAX) powder Dissolve 17 grams in at least 8 ounces water/juice and drink once daily. (Patient taking differently: Take by mouth daily as needed. Dissolve 17 grams in at least 8 ounces water/juice and drink once daily.) 527 g 2   predniSONE (DELTASONE) 10 MG tablet Directions for 6 day taper: Day 1: 2 tablets before breakfast, 1 after both lunch & dinner and 2 at bedtime Day 2: 1 tab before breakfast, 1 after both lunch & dinner and 2 at bedtime Day 3: 1 tab at each meal & 1 at bedtime Day 4: 1 tab at breakfast, 1 at lunch, 1 at bedtime Day 5: 1 tab at breakfast & 1 tab at bedtime Day 6: 1 tab at breakfast 21 tablet 0   Probiotic Product (PROBIOTIC DAILY PO) Take 1 tablet by mouth daily. Woman's Care     telmisartan (MICARDIS) 80 MG tablet Take 80 mg  by mouth daily.     valACYclovir (VALTREX) 500 MG tablet Take 1 tablet by mouth daily as needed (Flair up).     No current facility-administered medications for this visit.    Allergies as of 01/04/2022 - Review Complete 10/18/2021  Allergen Reaction Noted   Empagliflozin Other (See Comments) 11/13/2019   Metformin Nausea Only 05/22/2019   Semaglutide Other (See Comments) 05/22/2019   Tizanidine Other (See Comments) 11/11/2020    Family History  Problem  Relation Age of Onset   Diabetes Mother    Aneurysm Mother    Diabetes Father    Colon polyps Maternal Uncle    Liver disease Maternal Grandmother    Rheum arthritis Maternal Grandmother    Stroke Maternal Grandfather    Parkinson's disease Paternal Grandmother    Esophageal cancer Neg Hx    Colon cancer Neg Hx    Rectal cancer Neg Hx    Stomach cancer Neg Hx     Social History   Socioeconomic History   Marital status: Single    Spouse name: Not on file   Number of children: 1   Years of education: Not on file   Highest education level: Some college, no degree  Occupational History   Occupation: Investment banker, corporate: [employer]  Tobacco Use   Smoking status: Never   Smokeless tobacco: Never  Vaping Use   Vaping Use: Never used  Substance and Sexual Activity   Alcohol use: Not Currently   Drug use: Never   Sexual activity: Not on file  Other Topics Concern   Not on file  Social History Narrative   Lives at home. Her son lives with her.    Right handed   Caffeine: few times a year   Social Determinants of Health   Financial Resource Strain: Not on file  Food Insecurity: Not on file  Transportation Needs: Not on file  Physical Activity: Not on file  Stress: Not on file  Social Connections: Not on file  Intimate Partner Violence: Not on file    Review of Systems:    Constitutional: No weight loss, fever or chills Cardiovascular: No chest pain Respiratory: No SOB  Gastrointestinal: See HPI and  otherwise negative   Physical Exam:  Vital signs: BP (!) 142/70   Pulse 100   Ht _0  (1.676 m)   Wt 258 lb 4 oz (117.1 kg)   BMI 41.68 kg/m    Constitutional:   Pleasant AA female appears to be in NAD, Well developed, Well nourished, alert and cooperative Respiratory: Respirations even and unlabored. Lungs clear to auscultation bilaterally.   No wheezes, crackles, or rhonchi.  Cardiovascular: Normal S1, S2. No MRG. Regular rate and rhythm. No peripheral edema, cyanosis or pallor.  Gastrointestinal:  Soft, nondistended, moderate to marked epigastric TTP (also tender to only light palpation over her sternum in the same area), no rebound or guarding. Normal bowel sounds. No appreciable masses or hepatomegaly. Rectal:  Not performed.  Psychiatric:  Demonstrates good judgement and reason without abnormal affect or behaviors.  RELEVANT LABS AND IMAGING: CBC    Component Value Date/Time   WBC 4.9 07/09/2013 1346   RBC 4.63 07/09/2013 1346   HGB 13.4 07/09/2013 1346   HCT 40.5 07/09/2013 1346   PLT 234 07/09/2013 1346   MCV 87.5 07/09/2013 1346   MCH 28.9 07/09/2013 1346   MCHC 33.1 07/09/2013 1346   RDW 14.4 07/09/2013 1346    CMP     Component Value Date/Time   NA 140 10/18/2021 1035   K 4.6 10/18/2021 1035   CL 104 10/18/2021 1035   CO2 22 10/18/2021 1035   GLUCOSE 119 (H) 10/18/2021 1035   GLUCOSE 87 07/09/2013 1346   BUN 13 10/18/2021 1035   CREATININE 1.13 (H) 10/18/2021 1035   CALCIUM 9.4 10/18/2021 1035   PROT 7.2 10/18/2021 1035   ALBUMIN 4.6 10/18/2021 1035   AST 20 10/18/2021 1035   ALT 30 10/18/2021 1035  ALKPHOS 91 10/18/2021 1035   BILITOT 0.3 10/18/2021 1035   GFRNONAA 70 (L) 07/09/2013 1346   GFRAA 81 (L) 07/09/2013 1346    Assessment: 1.  Epigastric pain: Continues per patient, EGD unrevealing for cause, no change with twice daily PPI, patient concerned over hiatal hernia; consider musculoskeletal cause +/- hiatal hernia versus other 2.  Chronic  cough: Asthma/allergies +/- GERD  Plan: 1.  Given ongoing symptoms and fairly normal EGD ordered a CT of the abdomen with contrast for further evaluation of epigastric pain.  If this is normal patient wants to pursue an MRI. 2.  Patient will continue Omeprazole 40 mg daily-twice daily as needed for symptoms.  She did not need a refill today. 3.  Discussed with patient that I feel like she has an element of musculoskeletal pain as well given how tender she is to only superficial palpation of her sternum, this might be from her large breasts pulling on this area.  Patient tells me she feels like she has 2 separate pains going on. 4.  Patient to follow in clinic per recommendation after imaging above.  Also ordered BMP to check creatinine.  Ellouise Newer, PA-C [facility] 01/04/2022, 1:26 PM  Cc: Norma Pounds, NP

## 2022-01-04 NOTE — Patient Instructions (Signed)
Your provider has requested that you go to the basement level for lab work before leaving today. Press "B" on the elevator. The lab is located at the first door on the left as you exit the elevator.  You have been scheduled for a CT scan of the abdomen and pelvis at [facility], 1st floor Radiology. You are scheduled on 01/11/22 at 3:00 pm . You should arrive 2 hours prior to your appointment time  at 12:45 pm for registration and preparation for CT scan.   Please follow the written instructions below on the day of your exam:   1) Do not eat anything after 11:00 am  (4 hours prior to your test)   You may take any medications as prescribed with a small amount of water, if necessary. If you take any of the following medications: METFORMIN, GLUCOPHAGE, GLUCOVANCE, AVANDAMET, RIOMET, FORTAMET, [place] MET, JANUMET, GLUMETZA or METAGLIP, you MAY be asked to HOLD this medication 48 hours AFTER the exam.   The purpose of you drinking the oral contrast is to aid in the visualization of your intestinal tract. The contrast solution may cause some diarrhea. Depending on your individual set of symptoms, you may also receive an intravenous injection of x-ray contrast/dye. Plan on being at [facility] for 45 minutes or longer, depending on the type of exam you are having performed.   If you have any questions regarding your exam or if you need to reschedule, you may call [facility] at [phone] between the hours of 8:00 am and 5:00 pm, Monday-Friday.   Thank you for choosing me and [facility].  Ellouise Newer PA

## 2022-01-05 ENCOUNTER — Ambulatory Visit: Payer: No Typology Code available for payment source | Attending: Nurse Practitioner | Admitting: Nurse Practitioner

## 2022-01-05 ENCOUNTER — Other Ambulatory Visit: Payer: Self-pay | Admitting: Obstetrics and Gynecology

## 2022-01-05 ENCOUNTER — Ambulatory Visit: Payer: Self-pay | Admitting: *Deleted

## 2022-01-05 ENCOUNTER — Encounter: Payer: Self-pay | Admitting: Nurse Practitioner

## 2022-01-05 ENCOUNTER — Ambulatory Visit
Admission: RE | Admit: 2022-01-05 | Discharge: 2022-01-05 | Disposition: A | Discharge status: Home / Self Care | Payer: No Typology Code available for payment source | Source: Ambulatory Visit | Attending: Nurse Practitioner | Admitting: Nurse Practitioner

## 2022-01-05 VITALS — BP 129/83 | HR 97 | Temp 98.0°F | Ht 66.0 in | Wt 257.4 lb

## 2022-01-05 DIAGNOSIS — M25552 Pain in left hip: Secondary | ICD-10-CM | POA: Diagnosis not present

## 2022-01-05 DIAGNOSIS — N6310 Unspecified lump in the right breast, unspecified quadrant: Secondary | ICD-10-CM

## 2022-01-05 MED ORDER — ACETAMINOPHEN-CODEINE 300-30 MG PO TABS: 1.0000 | ORAL_TABLET | ORAL | 0 refills | Status: DC | PRN

## 2022-01-05 NOTE — Progress Notes (Signed)
Assessment & Plan:  Marceil was seen today for hip pain.  Diagnoses and all orders for this visit:  Left hip pain -     acetaminophen-codeine (TYLENOL #3) 300-30 MG tablet; Take 1-2 tablets by mouth every 4 (four) hours as needed for moderate pain. -     DG Hip Unilat W OR W/O Pelvis 2-3 Views Left; Future Work on losing weight to help reduce joint pain. May alternate with heat and ice application for pain relief. May also alternate with acetaminophen and Ibuprofen as prescribed pain relief. Other alternatives include massage, acupuncture and water aerobics.     Patient has been counseled on age-appropriate routine health concerns for screening and prevention. These are reviewed and up-to-date. Referrals have been placed accordingly. Immunizations are up-to-date or declined.    Subjective:   Chief Complaint  Patient presents with   Hip Pain   HPI Norma [name] 51 y.o. female presents to office today with complaints of left hip pain.  She has a past medical history of Allergy, Anemia, Anxiety, Arthritis, Asthma, Chronic headache, Colon polyps, DDD , thoracic, Depression, Diabetes mellitus, type 2,  Fibromyalgia, GERD, Goiter, Hyperlipidemia, Hypertension, Low back pain, Obesity, RA, Seasonal allergies, Seasonal depression, and Vitamin D deficiency.    Hip Pain: Patient complains of left hip pain. Onset of the symptoms was a week ago. Inciting event: none. Aggravating factors include weight bearing and walking up and down stairs. Associated symptoms: none.  Patient has had prior right hip problems. Previous visits for this problem: none. Evaluation to date: none.  Treatment to date: prescription analgesics, which have been not very effective.       Review of Systems  Constitutional:  Negative for fever, malaise/fatigue and weight loss.  HENT: Negative.  Negative for nosebleeds.   Eyes: Negative.  Negative for blurred vision, double vision and photophobia.  Respiratory:  Negative.  Negative for cough and shortness of breath.   Cardiovascular: Negative.  Negative for chest pain, palpitations and leg swelling.  Gastrointestinal: Negative.  Negative for heartburn, nausea and vomiting.  Musculoskeletal:  Positive for joint pain. Negative for myalgias.  Neurological: Negative.  Negative for dizziness, focal weakness, seizures and headaches.  Psychiatric/Behavioral: Negative.  Negative for suicidal ideas.     Past Medical History:  Diagnosis Date   Allergy    Anemia    hx of   Anxiety    Arthritis    KNEES   Asthma    treated for during COVID tx   Chronic headache    Colon polyps    TUBULAR ADENOMAS AND HYPERPLASTIC    DDD (degenerative disc disease), thoracic    Depression    Diabetes mellitus, type 2 ([place])    Difficult airway for intubation    Fibromyalgia    GERD (gastroesophageal reflux disease)    Goiter    Hyperlipidemia    Hypertension    Low back pain    Obesity    RA (rheumatoid arthritis) (HCC)    Seasonal allergies    Vitamin D deficiency     Past Surgical History:  Procedure Laterality Date   BACK SURGERY  2005   lumbar laminectomy   BALLOON DILATION N/A 06/01/2021   Procedure: BALLOON DILATION;  Surgeon: Jerene Bears, MD;  Location: [facility];  Service: Gastroenterology;  Laterality: N/A;   BARTHOLIN GLAND CYST EXCISION     BIOPSY  06/01/2021   Procedure: BIOPSY;  Surgeon: Jerene Bears, MD;  Location: [facility];  Service: Gastroenterology;;  COLONOSCOPY  2017   JMP-MAC-prep good-TA -recall 5 yr   COLONOSCOPY WITH PROPOFOL N/A 06/01/2021   Procedure: COLONOSCOPY WITH PROPOFOL;  Surgeon: Jerene Bears, MD;  Location: [facility];  Service: Gastroenterology;  Laterality: N/A;   ESOPHAGOGASTRODUODENOSCOPY (EGD) WITH PROPOFOL N/A 06/01/2021   Procedure: ESOPHAGOGASTRODUODENOSCOPY (EGD) WITH PROPOFOL;  Surgeon: Jerene Bears, MD;  Location: [facility];  Service: Gastroenterology;  Laterality: N/A;   LUMBAR  LAMINECTOMY/DECOMPRESSION MICRODISCECTOMY Right 07/17/2013   Procedure: LUMBAR LAMINECTOMY/DECOMPRESSION MICRODISCECTOMY 1 LEVEL,RIGHT LUMBAR FOUR-FIVE;  Surgeon: Faythe Ghee, MD;  Location: [facility];  Service: Neurosurgery;  Laterality: Right;  Right   PARTIAL HYSTERECTOMY     POLYPECTOMY  2017   TA and benign polypoid   POLYPECTOMY  06/01/2021   Procedure: POLYPECTOMY;  Surgeon: Jerene Bears, MD;  Location: [facility];  Service: Gastroenterology;;   WISDOM TOOTH EXTRACTION      Family History  Problem Relation Age of Onset   Diabetes Mother    Aneurysm Mother    Diabetes Father    Colon polyps Maternal Uncle    Liver disease Maternal Grandmother    Rheum arthritis Maternal Grandmother    Stroke Maternal Grandfather    Parkinson's disease Paternal Grandmother    Esophageal cancer Neg Hx    Colon cancer Neg Hx    Rectal cancer Neg Hx    Stomach cancer Neg Hx     Social History Reviewed with no changes to be made today.   Outpatient Medications Prior to Visit  Medication Sig Dispense Refill   aspirin 81 MG chewable tablet Chew by mouth daily.     celecoxib (CELEBREX) 200 MG capsule Take 200 mg by mouth daily.     ELDERBERRY PO Take 1 tablet by mouth daily. As needed     ezetimibe (ZETIA) 10 MG tablet Take 10 mg by mouth daily.     fluticasone (FLONASE) 50 MCG/ACT nasal spray Place 2 sprays into both nostrils daily as needed for allergies or rhinitis.     glipiZIDE (GLUCOTROL XL) 10 MG 24 hr tablet Take 1 tablet (10 mg total) by mouth daily. 90 tablet 1   montelukast (SINGULAIR) 10 MG tablet Take 10 mg by mouth at bedtime.     omeprazole (PRILOSEC) 40 MG capsule TAKE 1 CAPSULE EVERY DAY 90 capsule 0   polyethylene glycol powder (GLYCOLAX/MIRALAX) powder Dissolve 17 grams in at least 8 ounces water/juice and drink once daily. (Patient taking differently: Take by mouth daily as needed. Dissolve 17 grams in at least 8 ounces water/juice and drink once daily.) 527 g 2    Probiotic Product (PROBIOTIC DAILY PO) Take 1 tablet by mouth daily. [facility]     telmisartan (MICARDIS) 80 MG tablet Take 80 mg by mouth daily.     valACYclovir (VALTREX) 500 MG tablet Take 1 tablet by mouth daily as needed (Flair up). As needed     buPROPion (WELLBUTRIN XL) 150 MG 24 hr tablet Take 150 mg by mouth daily. (Patient not taking: Reported on 01/04/2022)     predniSONE (DELTASONE) 10 MG tablet Directions for 6 day taper: Day 1: 2 tablets before breakfast, 1 after both lunch & dinner and 2 at bedtime Day 2: 1 tab before breakfast, 1 after both lunch & dinner and 2 at bedtime Day 3: 1 tab at each meal & 1 at bedtime Day 4: 1 tab at breakfast, 1 at lunch, 1 at bedtime Day 5: 1 tab at breakfast & 1 tab at bedtime  Day 6: 1 tab at breakfast 21 tablet 0   No facility-administered medications prior to visit.    Allergies  Allergen Reactions   Empagliflozin Other (See Comments)    Flank pain    Metformin Nausea Only   Semaglutide Other (See Comments)    Headache   Tizanidine Other (See Comments)    Causes nightmares       Objective:    BP 129/83   Pulse 97   Temp 98 F (36.7 C) (Temporal)   Ht _0  (1.676 m)   Wt 257 lb 6.4 oz (116.8 kg)   SpO2 98%   BMI 41.55 kg/m  Wt Readings from Last 3 Encounters:  01/05/22 257 lb 6.4 oz (116.8 kg)  01/04/22 258 lb 4 oz (117.1 kg)  10/18/21 257 lb 3.2 oz (116.7 kg)    Physical Exam Vitals and nursing note reviewed.  Constitutional:      Appearance: She is well-developed.  HENT:     Head: Normocephalic and atraumatic.  Cardiovascular:     Rate and Rhythm: Normal rate and regular rhythm.     Heart sounds: Normal heart sounds. No murmur heard.    No friction rub. No gallop.  Pulmonary:     Effort: Pulmonary effort is normal. No tachypnea or respiratory distress.     Breath sounds: Normal breath sounds. No decreased breath sounds, wheezing, rhonchi or rales.  Chest:     Chest wall: No tenderness.  Abdominal:     General:  Bowel sounds are normal.     Palpations: Abdomen is soft.  Musculoskeletal:        General: Normal range of motion.     Cervical back: Normal range of motion.  Skin:    General: Skin is warm and dry.  Neurological:     Mental Status: She is alert and oriented to person, place, and time.     Coordination: Coordination normal.  Psychiatric:        Behavior: Behavior normal. Behavior is cooperative.        Thought Content: Thought content normal.        Judgment: Judgment normal.          Patient has been counseled extensively about nutrition and exercise as well as the importance of adherence with medications and regular follow-up. The patient was given clear instructions to go to ER or return to medical center if symptoms don't improve, worsen or new problems develop. The patient verbalized understanding.   Follow-up: Return if symptoms worsen or fail to improve.   Gildardo Pounds, FNP-BC [facility] [place], [place]   01/05/2022, 9:04 PM

## 2022-01-05 NOTE — Telephone Encounter (Signed)
Summary: hip pain   Pt state she has ongoing lt hip pain   Pt requesting an appt, next availability is 12-27   Pt inquiring if an xray can be ordered   Please assist further      Chief Complaint: left pain (moderate) Symptoms: constant pain sore to touch Frequency: last Thursday Pertinent Negatives: Patient denies back pain, shooting pain down leg, fever or rash. Disposition: '[]'$ ED /'[]'$ Urgent Care (no appt availability in office) / '[x]'$ Appointment(In office/virtual)/ '[]'$  [place] [facility]/ '[]'$ Home Care/ '[]'$ Refused Recommended Disposition /'[]'$ [place] [facility]/ '[]'$  Follow-up with PCP Additional Notes: appt today in office  Reason for Disposition  [1] MODERATE pain (e.g., interferes with normal activities, limping) AND [2] present > 3 days  Answer Assessment - Initial Assessment Questions 1. LOCATION and RADIATION: "Where is the pain located?"      Left top of hip bone  2. QUALITY: "What does the pain feel like?"  (e.g., sharp, dull, aching, burning)     Sharp dull 3. SEVERITY: "How bad is the pain?" "What does it keep you from doing?"   (Scale 1-10; or mild, moderate, severe)   -  MILD (1-3): doesn't interfere with normal activities    -  MODERATE (4-7): interferes with normal activities (e.g., work or school) or awakens from sleep, limping    -  SEVERE (8-10): excruciating pain, unable to do any normal activities, unable to walk     7/10 4. ONSET: "When did the pain start?" "Does it come and go, or is it there all the time?"     Last Thursday 5. WORK OR EXERCISE: "Has there been any recent work or exercise that involved this part of the body?"      N/a 6. CAUSE: "What do you think is causing the hip pain?"      unsure 7. AGGRAVATING FACTORS: "What makes the hip pain worse?" (e.g., walking, climbing stairs, running)     walking 8. OTHER SYMPTOMS: "Do you have any other symptoms?" (e.g., back pain, pain shooting down leg,  fever, rash)     no  Protocols used: Hip  Pain-A-AH

## 2022-01-05 NOTE — Telephone Encounter (Signed)
  Chief Complaint: The muscle relaxer medicine wasn't called into the pharmacy today.   She discussed it in detail with Norma Rankins, NP but it wasn't sent to the pharmacy.   She got the Tylenol #3 but not a muscle relaxer.    She really needs this for the weekend.   Can Norma [name] call it in this afternoon please?   She was seen today by Norma [name]. Symptoms: N/A Frequency: N/A Pertinent Negatives: Patient denies N/A Disposition: '[]'$ ED /'[]'$ Urgent Care (no appt availability in office) / '[]'$ Appointment(In office/virtual)/ '[]'$  [place] [facility]/ '[]'$ Home Care/ '[]'$ Refused Recommended Disposition /'[]'$ [place] [facility]/ '[x]'$  Follow-up with PCP Additional Notes: I sent a high priority message to [facility].

## 2022-01-05 NOTE — Telephone Encounter (Signed)
Message from [employer] sent at 01/05/2022  3:15 PM EDT  Summary: rx follow up / rx req   The patient has been seen today 01/05/22 and notified by their pharmacy that only one prescription has been submitted for them   The patient was under the impression that they would be prescribed something for their muscular discomfort as well   The patient would like to know if a prescription for their muscular discomfort will be submitted as well   Please contact the patient further when possible           Call History   Type Contact Phone/Fax User  01/05/2022 03:14 PM EDT Phone (Incoming) Norma [name], Norma [name] (Self) [phone] Jerilynn Mages) Lenon Curt, Eve[place] A   Reason for Disposition  [1] Caller has URGENT medicine question about med that PCP or specialist prescribed AND [2] triager unable to answer question  Answer Assessment - Initial Assessment Questions 1. NAME of MEDICINE: "What medicine(s) are you calling about?"     She was going to prescribe  a muscle relaxer for me but the pharmacy only got one prescription for the pain medicine (Tylenol #3) from her. 2. QUESTION: "What is your question?" (e.g., double dose of medicine, side effect)     I'm following up on the muscle relaxer medicine we discussed during our visit.   It wasn't sent to the pharmacy. 3. PRESCRIBER: "Who prescribed the medicine?" Reason: if prescribed by specialist, call should be referred to that group.     Geryl Rankins, NP 4. SYMPTOMS: "Do you have any symptoms?" If Yes, ask: "What symptoms are you having?"  "How bad are the symptoms (e.g., mild, moderate, severe)     NA 5. PREGNANCY:  "Is there any chance that you are pregnant?" "When was your last menstrual period?"     N/A  Protocols used: Medication Question Call-A-AH

## 2022-01-05 NOTE — Telephone Encounter (Signed)
Summary: hip pain   Pt state she has ongoing lt hip pain   Pt requesting an appt, next availability is 12-27   Pt inquiring if an xray can be ordered   Please assist further       Called patient to review sx of hip pain. No answer. LVMTCB [phone].

## 2022-01-08 ENCOUNTER — Other Ambulatory Visit: Payer: Self-pay | Admitting: Nurse Practitioner

## 2022-01-08 MED ORDER — METHOCARBAMOL 500 MG PO TABS: 500.0000 mg | ORAL_TABLET | Freq: Four times a day (QID) | ORAL | 0 refills | Status: DC

## 2022-01-08 NOTE — Telephone Encounter (Signed)
Pt asking for muscle relaxer I see that tylenol #3 was provided does pt need muscle relaxer as well.

## 2022-01-11 ENCOUNTER — Encounter (HOSPITAL_COMMUNITY): Payer: Self-pay

## 2022-01-11 ENCOUNTER — Ambulatory Visit (HOSPITAL_COMMUNITY)
Admission: RE | Admit: 2022-01-11 | Discharge: 2022-01-11 | Disposition: A | Discharge status: Home / Self Care | Payer: No Typology Code available for payment source | Source: Ambulatory Visit | Attending: Physician Assistant | Admitting: Physician Assistant

## 2022-01-11 DIAGNOSIS — R053 Chronic cough: Secondary | ICD-10-CM | POA: Diagnosis present

## 2022-01-11 DIAGNOSIS — R1013 Epigastric pain: Secondary | ICD-10-CM | POA: Diagnosis present

## 2022-01-11 MED ORDER — SODIUM CHLORIDE (PF) 0.9 % IJ SOLN
INTRAMUSCULAR | Status: AC
  Filled 2022-01-11: qty 50

## 2022-01-11 MED ORDER — IOHEXOL 300 MG/ML  SOLN
100.0000 mL | Freq: Once | INTRAMUSCULAR | Status: AC | PRN
  Administered 2022-01-11: 100 mL via INTRAVENOUS

## 2022-01-11 MED ORDER — IOHEXOL 9 MG/ML PO SOLN
ORAL | Status: AC
  Filled 2022-01-11: qty 1000

## 2022-01-19 NOTE — Progress Notes (Signed)
Addendum: Reviewed and agree with assessment and management plan. Karmin Kasprzak M, MD  

## 2022-01-23 ENCOUNTER — Encounter: Payer: Self-pay | Admitting: Nurse Practitioner

## 2022-01-23 ENCOUNTER — Other Ambulatory Visit: Payer: Self-pay | Admitting: Nurse Practitioner

## 2022-01-23 MED ORDER — CELECOXIB 100 MG PO CAPS: 100.0000 mg | ORAL_CAPSULE | Freq: Two times a day (BID) | ORAL | 3 refills | Status: DC

## 2022-01-25 ENCOUNTER — Ambulatory Visit: Payer: No Typology Code available for payment source | Admitting: Nurse Practitioner

## 2022-02-18 ENCOUNTER — Other Ambulatory Visit: Payer: Self-pay | Admitting: Nurse Practitioner

## 2022-02-23 ENCOUNTER — Other Ambulatory Visit: Payer: Self-pay | Admitting: Obstetrics and Gynecology

## 2022-02-23 ENCOUNTER — Ambulatory Visit
Admission: RE | Admit: 2022-02-23 | Discharge: 2022-02-23 | Disposition: A | Discharge status: Home / Self Care | Payer: No Typology Code available for payment source | Source: Ambulatory Visit | Attending: Obstetrics and Gynecology | Admitting: Obstetrics and Gynecology

## 2022-02-23 DIAGNOSIS — N6320 Unspecified lump in the left breast, unspecified quadrant: Secondary | ICD-10-CM

## 2022-02-23 DIAGNOSIS — N6310 Unspecified lump in the right breast, unspecified quadrant: Secondary | ICD-10-CM

## 2022-03-20 ENCOUNTER — Ambulatory Visit: Payer: No Typology Code available for payment source | Admitting: Internal Medicine

## 2022-04-17 ENCOUNTER — Other Ambulatory Visit: Payer: Self-pay | Admitting: Gastroenterology

## 2022-04-20 ENCOUNTER — Encounter: Payer: Self-pay | Admitting: Nurse Practitioner

## 2022-04-20 ENCOUNTER — Ambulatory Visit: Payer: No Typology Code available for payment source | Attending: Nurse Practitioner | Admitting: Nurse Practitioner

## 2022-04-20 VITALS — BP 114/76 | HR 71 | Ht 66.0 in | Wt 242.2 lb

## 2022-04-20 DIAGNOSIS — E1165 Type 2 diabetes mellitus with hyperglycemia: Secondary | ICD-10-CM

## 2022-04-20 DIAGNOSIS — E781 Pure hyperglyceridemia: Secondary | ICD-10-CM | POA: Diagnosis not present

## 2022-04-20 DIAGNOSIS — R7989 Other specified abnormal findings of blood chemistry: Secondary | ICD-10-CM

## 2022-04-20 DIAGNOSIS — I10 Essential (primary) hypertension: Secondary | ICD-10-CM

## 2022-04-20 DIAGNOSIS — E559 Vitamin D deficiency, unspecified: Secondary | ICD-10-CM

## 2022-04-20 LAB — POCT GLYCOSYLATED HEMOGLOBIN (HGB A1C): HbA1c, POC (controlled diabetic range): 6.6 % (ref 0.0–7.0)

## 2022-04-20 LAB — GLUCOSE, POCT (MANUAL RESULT ENTRY): POC Glucose: 122 mg/dl — AB (ref 70–99)

## 2022-04-20 NOTE — Progress Notes (Signed)
Assessment & Plan:  Norma [name] was seen today for hypertension and diabetes.  Diagnoses and all orders for this visit:  Controlled type 2 diabetes mellitus with hyperglycemia, without long-term current use of insulin (HCC) -     POCT glucose (manual entry) -     POCT glycosylated hemoglobin (Hb A1C) -     Microalbumin / creatinine urine ratio -     CMP14+EGFR Continue blood sugar control as discussed in office today, low carbohydrate diet, and regular physical exercise as tolerated, 150 minutes per week (30 min each day, 5 days per week, or 50 min 3 days per week). Keep blood sugar logs with fasting goal of 90-130 mg/dl, post prandial (after you eat) less than 180.  For Hypoglycemia: BS <60 and Hyperglycemia BS >400; contact the clinic ASAP. Annual eye exams and foot exams are recommended.   Vitamin D deficiency disease -     VITAMIN D 25 Hydroxy (Vit-D Deficiency, Fractures)  Hypertriglyceridemia -     Lipid panel  Abnormal CBC -     CBC with Differential  Primary hypertension Continue all antihypertensives as prescribed.  Reminded to bring in blood pressure log for follow  up appointment.  RECOMMENDATIONS: DASH/Mediterranean Diets are healthier choices for HTN.     Patient has been counseled on age-appropriate routine health concerns for screening and prevention. These are reviewed and up-to-date. Referrals have been placed accordingly. Immunizations are up-to-date or declined.    Subjective:   Chief Complaint  Patient presents with   Hypertension   Diabetes   HPI Norma [name] 52 y.o. female presents to office today for follow up for diabetes and hypertension.  She has a past medical history of Allergy, Anemia, Anxiety, Arthritis, Asthma, Chronic headache, Colon polyps, DDD , thoracic, Depression, Diabetes mellitus, type 2,  Fibromyalgia, GERD, Goiter, Hyperlipidemia, Hypertension, Low back pain, Obesity, RA, Seasonal allergies, Seasonal depression, and Vitamin D  deficiency.    Patient has been counseled on age-appropriate routine health concerns for screening and prevention. These are reviewed and up-to-date. Referrals have been placed accordingly. Immunizations are up-to-date or declined.     MAMMOGRAM: F/U Mammogram is due. Patient is aware COLONOSCOPY: UTD 05-2021 PAP: 02-22-2021    HTN Blood pressure is well-controlled with telmisartan 80 mg daily. BP Readings from Last 3 Encounters:  04/20/22 114/76  01/05/22 129/83  01/04/22 (!) 142/70      DM 2 Diabetes is well-controlled.  She has made significant dietary modifications and is working on continuously losing weight.  She is taking glipizide 10 mg daily. LDL pending Lab Results  Component Value Date   HGBA1C 6.6 04/20/2022    Lab Results  Component Value Date   HGBA1C 7.5 (H) 10/18/2021   Review of Systems  Constitutional:  Negative for fever, malaise/fatigue and weight loss.  HENT:  Positive for congestion, ear pain and sinus pain. Negative for nosebleeds.   Eyes: Negative.  Negative for blurred vision, double vision and photophobia.  Respiratory: Negative.  Negative for cough and shortness of breath.   Cardiovascular: Negative.  Negative for chest pain, palpitations and leg swelling.  Gastrointestinal: Negative.  Negative for heartburn, nausea and vomiting.  Musculoskeletal: Negative.  Negative for myalgias.  Neurological: Negative.  Negative for dizziness, focal weakness, seizures and headaches.  Psychiatric/Behavioral: Negative.  Negative for suicidal ideas.     Past Medical History:  Diagnosis Date   Allergy    Anemia    hx of   Anxiety    Arthritis  KNEES   Asthma    treated for during COVID tx   Chronic headache    Colon polyps    TUBULAR ADENOMAS AND HYPERPLASTIC    DDD (degenerative disc disease), thoracic    Depression    Diabetes mellitus, type 2 ([place])    Difficult airway for intubation    Fibromyalgia    GERD (gastroesophageal reflux disease)     Goiter    Hyperlipidemia    Hypertension    Low back pain    Obesity    RA (rheumatoid arthritis) (HCC)    Seasonal allergies    Vitamin D deficiency     Past Surgical History:  Procedure Laterality Date   BACK SURGERY  2005   lumbar laminectomy   BALLOON DILATION N/A 06/01/2021   Procedure: BALLOON DILATION;  Surgeon: Jerene Bears, MD;  [facility];  Service: Gastroenterology;  Laterality: N/A;   BARTHOLIN GLAND CYST EXCISION     BIOPSY  06/01/2021   Procedure: BIOPSY;  Surgeon: Jerene Bears, MD;  [facility];  Service: Gastroenterology;;   COLONOSCOPY  2017   JMP-MAC-prep good-TA -recall 5 yr   COLONOSCOPY WITH PROPOFOL N/A 06/01/2021   Procedure: COLONOSCOPY WITH PROPOFOL;  Surgeon: Jerene Bears, MD;  [facility];  Service: Gastroenterology;  Laterality: N/A;   ESOPHAGOGASTRODUODENOSCOPY (EGD) WITH PROPOFOL N/A 06/01/2021   Procedure: ESOPHAGOGASTRODUODENOSCOPY (EGD) WITH PROPOFOL;  Surgeon: Jerene Bears, MD;  [facility];  Service: Gastroenterology;  Laterality: N/A;   LUMBAR LAMINECTOMY/DECOMPRESSION MICRODISCECTOMY Right 07/17/2013   Procedure: LUMBAR LAMINECTOMY/DECOMPRESSION MICRODISCECTOMY 1 LEVEL,RIGHT LUMBAR FOUR-FIVE;  Surgeon: Faythe Ghee, MD;  [facility];  Service: Neurosurgery;  Laterality: Right;  Right   PARTIAL HYSTERECTOMY     POLYPECTOMY  2017   TA and benign polypoid   POLYPECTOMY  06/01/2021   Procedure: POLYPECTOMY;  Surgeon: Jerene Bears, MD;  [facility];  Service: Gastroenterology;;   WISDOM TOOTH EXTRACTION      Family History  Problem Relation Age of Onset   Diabetes Mother    Aneurysm Mother    Diabetes Father    Colon polyps Maternal Uncle    Liver disease Maternal Grandmother    Rheum arthritis Maternal Grandmother    Stroke Maternal Grandfather    Parkinson's disease Paternal Grandmother    Esophageal cancer Neg Hx    Colon cancer Neg Hx    Rectal cancer Neg Hx     Stomach cancer Neg Hx     Social History Reviewed with no changes to be made today.   Outpatient Medications Prior to Visit  Medication Sig Dispense Refill   acetaminophen-codeine (TYLENOL #3) 300-30 MG tablet Take 1-2 tablets by mouth every 4 (four) hours as needed for moderate pain. 60 tablet 0   aspirin 81 MG chewable tablet Chew by mouth daily.     celecoxib (CELEBREX) 100 MG capsule Take 1 capsule (100 mg total) by mouth 2 (two) times daily. 60 capsule 3   ELDERBERRY PO Take 1 tablet by mouth daily. As needed     estradiol (ESTRACE) 0.5 MG tablet Take 0.5 mg by mouth daily.     ezetimibe (ZETIA) 10 MG tablet Take 10 mg by mouth daily.     fluticasone (FLONASE) 50 MCG/ACT nasal spray Place 2 sprays into both nostrils daily as needed for allergies or rhinitis.     glipiZIDE (GLUCOTROL XL) 10 MG 24 hr tablet TAKE 1 TABLET BY MOUTH EVERY DAY 90 tablet 0  montelukast (SINGULAIR) 10 MG tablet Take 10 mg by mouth at bedtime.     omeprazole (PRILOSEC) 40 MG capsule TAKE 1 CAPSULE BY MOUTH EVERY DAY 90 capsule 0   polyethylene glycol powder (GLYCOLAX/MIRALAX) powder Dissolve 17 grams in at least 8 ounces water/juice and drink once daily. 527 g 2   Probiotic Product (PROBIOTIC DAILY PO) Take 1 tablet by mouth daily. [employer]'s Care     telmisartan (MICARDIS) 80 MG tablet Take 80 mg by mouth daily.     valACYclovir (VALTREX) 500 MG tablet Take 1 tablet by mouth daily as needed (Flair up). As needed     estradiol (ESTRACE) 1 MG tablet Take 0.5 mg by mouth daily.     methocarbamol (ROBAXIN) 500 MG tablet Take 1 tablet (500 mg total) by mouth 4 (four) times daily. 60 tablet 0   No facility-administered medications prior to visit.    Allergies  Allergen Reactions   Empagliflozin Other (See Comments)    Flank pain    Metformin Nausea Only   Semaglutide Other (See Comments)    Headache   Tizanidine Other (See Comments)    Causes nightmares       Objective:    BP 114/76   Pulse 71   Ht 5'  6" (1.676 m)   Wt 242 lb 3.2 oz (109.9 kg)   SpO2 97%   BMI 39.09 kg/m  Wt Readings from Last 3 Encounters:  04/20/22 242 lb 3.2 oz (109.9 kg)  01/05/22 257 lb 6.4 oz (116.8 kg)  01/04/22 258 lb 4 oz (117.1 kg)    Physical Exam Vitals and nursing note reviewed.  Constitutional:      Appearance: She is well-developed.  HENT:     Head: Normocephalic and atraumatic.     Right Ear: A middle ear effusion is present.     Left Ear: A middle ear effusion is present.     Nose:     Right Turbinates: Enlarged, swollen and pale.     Left Turbinates: Enlarged, swollen and pale.  Cardiovascular:     Rate and Rhythm: Normal rate and regular rhythm.     Heart sounds: Normal heart sounds. No murmur heard.    No friction rub. No gallop.  Pulmonary:     Effort: Pulmonary effort is normal. No tachypnea or respiratory distress.     Breath sounds: Normal breath sounds. No decreased breath sounds, wheezing, rhonchi or rales.  Chest:     Chest wall: No tenderness.  Abdominal:     General: Bowel sounds are normal.     Palpations: Abdomen is soft.  Musculoskeletal:        General: Normal range of motion.     Cervical back: Normal range of motion.  Skin:    General: Skin is warm and dry.  Neurological:     Mental Status: She is alert and oriented to person, place, and time.     Coordination: Coordination normal.  Psychiatric:        Behavior: Behavior normal. Behavior is cooperative.        Thought Content: Thought content normal.        Judgment: Judgment normal.          Patient has been counseled extensively about nutrition and exercise as well as the importance of adherence with medications and regular follow-up. The patient was given clear instructions to go to ER or return to medical center if symptoms don't improve, worsen or new problems develop. The patient verbalized  understanding.   Follow-up: Return in about 3 months (around 07/19/2022).   Gildardo Pounds, FNP-BC [facility] [place], [place]   04/20/2022, 2:25 PM

## 2022-04-21 ENCOUNTER — Encounter: Payer: Self-pay | Admitting: Nurse Practitioner

## 2022-04-23 LAB — CBC WITH DIFFERENTIAL/PLATELET
Basophils Absolute: 0 10*3/uL (ref 0.0–0.2)
Basos: 1 %
EOS (ABSOLUTE): 0 10*3/uL (ref 0.0–0.4)
Eos: 1 %
Hematocrit: 39.8 % (ref 34.0–46.6)
Hemoglobin: 12.6 g/dL (ref 11.1–15.9)
Immature Grans (Abs): 0 10*3/uL (ref 0.0–0.1)
Immature Granulocytes: 0 %
Lymphocytes Absolute: 1.9 10*3/uL (ref 0.7–3.1)
Lymphs: 43 %
MCH: 26.5 pg — ABNORMAL LOW (ref 26.6–33.0)
MCHC: 31.7 g/dL (ref 31.5–35.7)
MCV: 84 fL (ref 79–97)
Monocytes Absolute: 0.4 10*3/uL (ref 0.1–0.9)
Monocytes: 8 %
Neutrophils Absolute: 2.1 10*3/uL (ref 1.4–7.0)
Neutrophils: 47 %
Platelets: 198 10*3/uL (ref 150–450)
RBC: 4.76 x10E6/uL (ref 3.77–5.28)
RDW: 15.6 % — ABNORMAL HIGH (ref 11.7–15.4)
WBC: 4.5 10*3/uL (ref 3.4–10.8)

## 2022-04-23 LAB — LIPID PANEL
Chol/HDL Ratio: 3.8 ratio (ref 0.0–4.4)
Cholesterol, Total: 184 mg/dL (ref 100–199)
HDL: 48 mg/dL (ref >39)
LDL Chol Calc (NIH): 116 mg/dL — ABNORMAL HIGH (ref 0–99)
Triglycerides: 113 mg/dL (ref 0–149)
VLDL Cholesterol Cal: 20 mg/dL (ref 5–40)

## 2022-04-23 LAB — MICROALBUMIN / CREATININE URINE RATIO
Creatinine, Urine: 151.3 mg/dL
Microalb/Creat Ratio: 19 mg/g creat (ref 0–29)
Microalbumin, Urine: 29.5 ug/mL

## 2022-04-23 LAB — CMP14+EGFR
ALT: 31 IU/L (ref 0–32)
AST: 21 IU/L (ref 0–40)
Albumin/Globulin Ratio: 1.9 (ref 1.2–2.2)
Albumin: 4.8 g/dL (ref 3.8–4.9)
Alkaline Phosphatase: 97 IU/L (ref 44–121)
BUN/Creatinine Ratio: 16 (ref 9–23)
BUN: 15 mg/dL (ref 6–24)
Bilirubin Total: 0.4 mg/dL (ref 0.0–1.2)
CO2: 22 mmol/L (ref 20–29)
Calcium: 9.7 mg/dL (ref 8.7–10.2)
Chloride: 102 mmol/L (ref 96–106)
Creatinine, Ser: 0.94 mg/dL (ref 0.57–1.00)
Globulin, Total: 2.5 g/dL (ref 1.5–4.5)
Glucose: 113 mg/dL — ABNORMAL HIGH (ref 70–99)
Potassium: 4.9 mmol/L (ref 3.5–5.2)
Sodium: 140 mmol/L (ref 134–144)
Total Protein: 7.3 g/dL (ref 6.0–8.5)
eGFR: 73 mL/min/{1.73_m2} (ref >59)

## 2022-04-23 LAB — VITAMIN D 25 HYDROXY (VIT D DEFICIENCY, FRACTURES): Vit D, 25-Hydroxy: 62.9 ng/mL (ref 30.0–100.0)

## 2022-05-16 ENCOUNTER — Other Ambulatory Visit: Payer: Self-pay | Admitting: Family Medicine

## 2022-05-16 NOTE — Telephone Encounter (Signed)
Requested Prescriptions  Pending Prescriptions Disp Refills   glipiZIDE (GLUCOTROL XL) 10 MG 24 hr tablet [Pharmacy Med Name: GLIPIZIDE ER 10 MG TABLET] 90 tablet 0    Sig: TAKE 1 TABLET BY MOUTH EVERY DAY     Endocrinology:  Diabetes - Sulfonylureas Passed - 05/16/2022  8:33 AM      Passed - HBA1C is between 0 and 7.9 and within 180 days    HbA1c, POC (controlled diabetic range)  Date Value Ref Range Status  04/20/2022 6.6 0.0 - 7.0 % Final         Passed - Cr in normal range and within 360 days    Creatinine, Ser  Date Value Ref Range Status  04/20/2022 0.94 0.57 - 1.00 mg/dL Final         Passed - Valid encounter within last 6 months    Recent Outpatient Visits           3 weeks ago Controlled type 2 diabetes mellitus with hyperglycemia, without long-term current use of insulin [facility])   [place], Vernia Buff, NP   4 months ago Left hip pain   [place], Vernia Buff, NP   7 months ago Encounter to establish care   [place] Gildardo Pounds, NP       Future Appointments             In 2 months Gildardo Pounds, NP [place]

## 2022-07-13 ENCOUNTER — Other Ambulatory Visit: Payer: Self-pay | Admitting: Nurse Practitioner

## 2022-07-13 MED ORDER — TELMISARTAN 80 MG PO TABS: 80.0000 mg | ORAL_TABLET | Freq: Every day | ORAL | 1 refills | Status: DC

## 2022-07-13 NOTE — Telephone Encounter (Signed)
Pt stated she only has 1 pill remaining and she will be going out of town so she needs this expedited. Pt requests to be contacted once the Rx is sent to the pharmacy  Medication Refill - Medication: telmisartan (MICARDIS) 80 MG tablet  Has the patient contacted their pharmacy? Yes.    Preferred Pharmacy (with phone number or street name):  [facility] [phone] [place], [place] - [address] Phone: [phone]  Fax: [phone]     Has the patient been seen for an appointment in the last year OR does the patient have an upcoming appointment? Yes.    Agent: Please be advised that RX refills may take up to 3 business days. We ask that you follow-up with your pharmacy.

## 2022-07-13 NOTE — Telephone Encounter (Signed)
Requested medication (s) are due for refill today - unsure  Requested medication (s) are on the active medication list -yes  Future visit scheduled -yes  Last refill: 06/27/2017  Notes to clinic: medication listed as historical provider- sent for review -patient states she only has 1 pill left and going out of town  Requested Prescriptions  Pending Prescriptions Disp Refills   telmisartan (MICARDIS) 80 MG tablet      Sig: Take 1 tablet (80 mg total) by mouth daily.     Cardiovascular:  Angiotensin Receptor Blockers Passed - 07/13/2022 11:09 AM      Passed - Cr in normal range and within 180 days    Creatinine, Ser  Date Value Ref Range Status  04/20/2022 0.94 0.57 - 1.00 mg/dL Final         Passed - K in normal range and within 180 days    Potassium  Date Value Ref Range Status  04/20/2022 4.9 3.5 - 5.2 mmol/L Final         Passed - Patient is not pregnant      Passed - Last BP in normal range    BP Readings from Last 1 Encounters:  04/20/22 114/76         Passed - Valid encounter within last 6 months    Recent Outpatient Visits           2 months ago Controlled type 2 diabetes mellitus with hyperglycemia, without long-term current use of insulin [facility])   [place] [facility] [place], Shea Stakes, NP   6 months ago Left hip pain   [place] [facility] [place], Shea Stakes, NP   8 months ago Encounter to establish care   [place] [facility] Claiborne Rigg, NP       Future Appointments             In 1 week Claiborne Rigg, NP [facility]               Requested Prescriptions  Pending Prescriptions Disp Refills   telmisartan (MICARDIS) 80 MG tablet      Sig: Take 1 tablet (80 mg total) by mouth daily.     Cardiovascular:  Angiotensin Receptor Blockers Passed - 07/13/2022 11:09 AM      Passed - Cr in normal range and within 180 days    Creatinine,  Ser  Date Value Ref Range Status  04/20/2022 0.94 0.57 - 1.00 mg/dL Final         Passed - K in normal range and within 180 days    Potassium  Date Value Ref Range Status  04/20/2022 4.9 3.5 - 5.2 mmol/L Final         Passed - Patient is not pregnant      Passed - Last BP in normal range    BP Readings from Last 1 Encounters:  04/20/22 114/76         Passed - Valid encounter within last 6 months    Recent Outpatient Visits           2 months ago Controlled type 2 diabetes mellitus with hyperglycemia, without long-term current use of insulin [facility])   [place] [facility] [place], Shea Stakes, NP   6 months ago Left hip pain   [facility] [place], Shea Stakes, NP   8 months ago  Encounter to establish care   [facility] Claiborne Rigg, NP       Future Appointments             In 1 week Claiborne Rigg, NP [facility]

## 2022-07-13 NOTE — Telephone Encounter (Signed)
Pt is calling back in upset because her medication has not been called in as of  yet today. Pt states that she is needing her medication and is requesting that her medication is expedited and sent to the pharmacy today. Pt state that she will check back in with her pharmacy by 6pm to see if it is there. Please advise.

## 2022-07-20 ENCOUNTER — Ambulatory Visit: Payer: No Typology Code available for payment source | Admitting: Nurse Practitioner

## 2022-07-23 ENCOUNTER — Other Ambulatory Visit: Payer: Self-pay | Admitting: Nurse Practitioner

## 2022-08-31 ENCOUNTER — Encounter: Payer: Self-pay | Admitting: Nurse Practitioner

## 2022-08-31 ENCOUNTER — Ambulatory Visit: Payer: No Typology Code available for payment source | Attending: Nurse Practitioner | Admitting: Nurse Practitioner

## 2022-08-31 ENCOUNTER — Other Ambulatory Visit: Payer: No Typology Code available for payment source

## 2022-08-31 VITALS — BP 125/81 | HR 75 | Ht 66.0 in | Wt 248.2 lb

## 2022-08-31 DIAGNOSIS — E1165 Type 2 diabetes mellitus with hyperglycemia: Secondary | ICD-10-CM | POA: Diagnosis not present

## 2022-08-31 DIAGNOSIS — I10 Essential (primary) hypertension: Secondary | ICD-10-CM

## 2022-08-31 DIAGNOSIS — Z7984 Long term (current) use of oral hypoglycemic drugs: Secondary | ICD-10-CM

## 2022-08-31 DIAGNOSIS — F320 Major depressive disorder, single episode, mild: Secondary | ICD-10-CM | POA: Diagnosis not present

## 2022-08-31 MED ORDER — BUPROPION HCL ER (XL) 150 MG PO TB24: 150.0000 mg | ORAL_TABLET | Freq: Every day | ORAL | 1 refills | Status: DC

## 2022-08-31 MED ORDER — GLIMEPIRIDE 1 MG PO TABS: 1.0000 mg | ORAL_TABLET | Freq: Every day | ORAL | 1 refills | Status: DC

## 2022-08-31 NOTE — Progress Notes (Signed)
Assessment & Plan:  Norma [name] was seen today for hypertension.  Diagnoses and all orders for this visit:  Primary hypertension Continue telmisartan as prescribed .  Controlled type 2 diabetes mellitus with hyperglycemia, without long-term current use of insulin  Started on glimepiride -     Hemoglobin A1c -     glimepiride (AMARYL) 1 MG tablet; Take 1 tablet (1 mg total) by mouth daily with breakfast.  Current mild episode of major depressive disorder without prior episode (HCC) Requests to go back on wellbutrin -     buPROPion (WELLBUTRIN XL) 150 MG 24 hr tablet; Take 1 tablet (150 mg total) by mouth daily.    Patient has been counseled on age-appropriate routine health concerns for screening and prevention. These are reviewed and up-to-date. Referrals have been placed accordingly. Immunizations are up-to-date or declined.    Subjective:   Chief Complaint  Patient presents with   Hypertension    Norma [name] 52 y.o. female presents to office today for follow up to HTN  She has a past medical history of Allergy, Anemia, Anxiety, Arthritis, Asthma, Chronic headache, Colon polyps, DDD , thoracic, Depression, Diabetes mellitus, type 2,  Fibromyalgia, GERD, Goiter, Hyperlipidemia, Hypertension, Low back pain, Obesity, RA, Seasonal allergies, Seasonal depression, and Vitamin D deficiency.    Patient has been counseled on age-appropriate routine health concerns for screening and prevention. These are reviewed and up-to-date. Referrals have been placed accordingly. Immunizations are up-to-date or declined.     MAMMOGRAM: F/U Mammogram is due. Patient aware COLONOSCOPY: UTD 05-2021 PAP: 02-22-2021   HTN Taking telmisartan 80 mg daily as prescribed.  BP Readings from Last 3 Encounters:  08/31/22 125/81  04/20/22 114/76  01/05/22 129/83     DM 2 Diabetes is well-controlled.  She has a minor setback with her weight loss progression and is working on losing weight.  Not taking  glipizide due to side effects of ankle swelling. Side effects to other diabetes medications include: metformin to GI upset, Ozempic  caused nausea, rybelsus with GI upset, januvia caused back pain Lab Results  Component Value Date   HGBA1C 6.6 04/20/2022    Review of Systems  Constitutional:  Negative for fever, malaise/fatigue and weight loss.  HENT: Negative.  Negative for nosebleeds.   Eyes: Negative.  Negative for blurred vision, double vision and photophobia.  Respiratory: Negative.  Negative for cough and shortness of breath.   Cardiovascular: Negative.  Negative for chest pain, palpitations and leg swelling.  Gastrointestinal: Negative.  Negative for heartburn, nausea and vomiting.  Musculoskeletal: Negative.  Negative for myalgias.  Neurological: Negative.  Negative for dizziness, focal weakness, seizures and headaches.  Psychiatric/Behavioral: Negative.  Negative for suicidal ideas.     Past Medical History:  Diagnosis Date   Allergy    Anemia    hx of   Anxiety    Arthritis    KNEES   Asthma    treated for during COVID tx   Chronic headache    Colon polyps    TUBULAR ADENOMAS AND HYPERPLASTIC    DDD (degenerative disc disease), thoracic    Depression    Diabetes mellitus, type 2 (HCC)    Difficult airway for intubation    Fibromyalgia    GERD (gastroesophageal reflux disease)    Goiter    Hyperlipidemia    Hypertension    Low back pain    Obesity    RA (rheumatoid arthritis) (HCC)    Seasonal allergies    Vitamin D  deficiency     Past Surgical History:  Procedure Laterality Date   BACK SURGERY  2005   lumbar laminectomy   BALLOON DILATION N/A 06/01/2021   Procedure: BALLOON DILATION;  Surgeon: Beverley Fiedler, MD;  [facility];  Service: Gastroenterology;  Laterality: N/A;   BARTHOLIN GLAND CYST EXCISION     BIOPSY  06/01/2021   Procedure: BIOPSY;  Surgeon: Beverley Fiedler, MD;  [facility];  Service: Gastroenterology;;   COLONOSCOPY   2017   JMP-MAC-prep good-TA -recall 5 yr   COLONOSCOPY WITH PROPOFOL N/A 06/01/2021   Procedure: COLONOSCOPY WITH PROPOFOL;  Surgeon: Beverley Fiedler, MD;  [facility];  Service: Gastroenterology;  Laterality: N/A;   ESOPHAGOGASTRODUODENOSCOPY (EGD) WITH PROPOFOL N/A 06/01/2021   Procedure: ESOPHAGOGASTRODUODENOSCOPY (EGD) WITH PROPOFOL;  Surgeon: Beverley Fiedler, MD;  [facility];  Service: Gastroenterology;  Laterality: N/A;   LUMBAR LAMINECTOMY/DECOMPRESSION MICRODISCECTOMY Right 07/17/2013   Procedure: LUMBAR LAMINECTOMY/DECOMPRESSION MICRODISCECTOMY 1 LEVEL,RIGHT LUMBAR FOUR-FIVE;  Surgeon: Reinaldo Meeker, MD;  [facility];  Service: Neurosurgery;  Laterality: Right;  Right   PARTIAL HYSTERECTOMY     POLYPECTOMY  2017   TA and benign polypoid   POLYPECTOMY  06/01/2021   Procedure: POLYPECTOMY;  Surgeon: Beverley Fiedler, MD;  [facility];  Service: Gastroenterology;;   WISDOM TOOTH EXTRACTION      Family History  Problem Relation Age of Onset   Diabetes Mother    Aneurysm Mother    Diabetes Father    Colon polyps Maternal Uncle    Liver disease Maternal Grandmother    Rheum arthritis Maternal Grandmother    Stroke Maternal Grandfather    Parkinson's disease Paternal Grandmother    Esophageal cancer Neg Hx    Colon cancer Neg Hx    Rectal cancer Neg Hx    Stomach cancer Neg Hx     Social History Reviewed with no changes to be made today.   Outpatient Medications Prior to Visit  Medication Sig Dispense Refill   aspirin 81 MG chewable tablet Chew by mouth daily.     celecoxib (CELEBREX) 100 MG capsule Take 1 capsule (100 mg total) by mouth 2 (two) times daily. 60 capsule 3   ELDERBERRY PO Take 1 tablet by mouth daily. As needed     estradiol (ESTRACE) 0.5 MG tablet Take 0.5 mg by mouth daily.     ezetimibe (ZETIA) 10 MG tablet TAKE 1 TABLET BY MOUTH EVERY DAY 90 tablet 0   omeprazole (PRILOSEC) 40 MG capsule TAKE 1 CAPSULE BY MOUTH EVERY  DAY 90 capsule 0   Probiotic Product (PROBIOTIC DAILY PO) Take 1 tablet by mouth daily. Woman's Care     telmisartan (MICARDIS) 80 MG tablet Take 1 tablet (80 mg total) by mouth daily. 90 tablet 1   valACYclovir (VALTREX) 500 MG tablet Take 1 tablet by mouth daily as needed (Flair up). As needed     polyethylene glycol powder (GLYCOLAX/MIRALAX) powder Dissolve 17 grams in at least 8 ounces water/juice and drink once daily. 527 g 2   acetaminophen-codeine (TYLENOL #3) 300-30 MG tablet Take 1-2 tablets by mouth every 4 (four) hours as needed for moderate pain. 60 tablet 0   fluticasone (FLONASE) 50 MCG/ACT nasal spray Place 2 sprays into both nostrils daily as needed for allergies or rhinitis.     glipiZIDE (GLUCOTROL XL) 10 MG 24 hr tablet TAKE 1 TABLET BY MOUTH EVERY DAY 90 tablet 1   montelukast (SINGULAIR) 10 MG  tablet Take 10 mg by mouth at bedtime.     No facility-administered medications prior to visit.    Allergies  Allergen Reactions   Empagliflozin Other (See Comments)    Flank pain    Metformin Nausea Only   Semaglutide Other (See Comments)    Headache   Tizanidine Other (See Comments)    Causes nightmares       Objective:    BP 125/81 (BP Location: Left Arm, Patient Position: Sitting, Cuff Size: Large)   Pulse 75   Ht 5\' 6"  (1.676 m)   Wt 248 lb 3.2 oz (112.6 kg)   SpO2 98%   BMI 40.06 kg/m  Wt Readings from Last 3 Encounters:  08/31/22 248 lb 3.2 oz (112.6 kg)  04/20/22 242 lb 3.2 oz (109.9 kg)  01/05/22 257 lb 6.4 oz (116.8 kg)    Physical Exam Vitals and nursing note reviewed.  Constitutional:      Appearance: She is well-developed.  HENT:     Head: Normocephalic and atraumatic.  Cardiovascular:     Rate and Rhythm: Normal rate and regular rhythm.     Heart sounds: Normal heart sounds. No murmur heard.    No friction rub. No gallop.  Pulmonary:     Effort: Pulmonary effort is normal. No tachypnea or respiratory distress.     Breath sounds: Normal  breath sounds. No decreased breath sounds, wheezing, rhonchi or rales.  Chest:     Chest wall: No tenderness.  Abdominal:     General: Bowel sounds are normal.     Palpations: Abdomen is soft.  Musculoskeletal:        General: Normal range of motion.     Cervical back: Normal range of motion.  Skin:    General: Skin is warm and dry.  Neurological:     Mental Status: She is alert and oriented to person, place, and time.     Coordination: Coordination normal.  Psychiatric:        Behavior: Behavior normal. Behavior is cooperative.        Thought Content: Thought content normal.        Judgment: Judgment normal.          Patient has been counseled extensively about nutrition and exercise as well as the importance of adherence with medications and regular follow-up. The patient was given clear instructions to go to ER or return to medical center if symptoms don't improve, worsen or new problems develop. The patient verbalized understanding.   Follow-up: Return in 3 months (on 12/01/2022).   Claiborne Rigg, FNP-BC [facility] [place], [place] [phone]   08/31/2022, 2:09 PM

## 2022-09-18 ENCOUNTER — Other Ambulatory Visit: Payer: Self-pay | Admitting: Gastroenterology

## 2022-09-19 ENCOUNTER — Other Ambulatory Visit: Payer: Self-pay | Admitting: Nurse Practitioner

## 2022-09-22 ENCOUNTER — Other Ambulatory Visit: Payer: Self-pay | Admitting: Nurse Practitioner

## 2022-09-22 DIAGNOSIS — E1165 Type 2 diabetes mellitus with hyperglycemia: Secondary | ICD-10-CM

## 2022-09-24 NOTE — Telephone Encounter (Signed)
Requested Prescriptions  Refused Prescriptions Disp Refills   glimepiride (AMARYL) 1 MG tablet [Pharmacy Med Name: GLIMEPIRIDE 1 MG TABLET] 90 tablet 1    Sig: TAKE 1 TABLET BY MOUTH DAILY WITH BREAKFAST.     Endocrinology:  Diabetes - Sulfonylureas Passed - 09/22/2022  8:55 PM      Passed - HBA1C is between 0 and 7.9 and within 180 days    HbA1c, POC (controlled diabetic range)  Date Value Ref Range Status  04/20/2022 6.6 0.0 - 7.0 % Final         Passed - Cr in normal range and within 360 days    Creatinine, Ser  Date Value Ref Range Status  04/20/2022 0.94 0.57 - 1.00 mg/dL Final         Passed - Valid encounter within last 6 months    Recent Outpatient Visits           3 weeks ago Primary hypertension   [place] [facility] [place], [place] W, NP   5 months ago Controlled type 2 diabetes mellitus with hyperglycemia, without long-term current use of insulin [facility])   [facility] [place], Shea Stakes, NP   8 months ago Left hip pain   [place] [facility] [place], Shea Stakes, NP   11 months ago Encounter to establish care   [facility] [place], Shea Stakes, NP

## 2022-09-26 ENCOUNTER — Ambulatory Visit: Payer: No Typology Code available for payment source | Admitting: Nurse Practitioner

## 2022-10-08 ENCOUNTER — Other Ambulatory Visit: Payer: Self-pay | Admitting: Nurse Practitioner

## 2022-10-08 DIAGNOSIS — E1165 Type 2 diabetes mellitus with hyperglycemia: Secondary | ICD-10-CM

## 2022-10-09 NOTE — Telephone Encounter (Signed)
Requested medication (s) are due for refill today: yes  Requested medication (s) are on the active medication list: yes  Last refill:  08/31/22 #30 1 refills  Future visit scheduled: no   Notes to clinic:   DX Code Needed    do you want to refill Rx?     Requested Prescriptions  Pending Prescriptions Disp Refills   glimepiride (AMARYL) 1 MG tablet [Pharmacy Med Name: GLIMEPIRIDE 1 MG TABLET] 30 tablet 1    Sig: TAKE 1 TABLET BY MOUTH DAILY WITH BREAKFAST.     Endocrinology:  Diabetes - Sulfonylureas Passed - 10/08/2022  8:57 AM      Passed - HBA1C is between 0 and 7.9 and within 180 days    HbA1c, POC (controlled diabetic range)  Date Value Ref Range Status  04/20/2022 6.6 0.0 - 7.0 % Final         Passed - Cr in normal range and within 360 days    Creatinine, Ser  Date Value Ref Range Status  04/20/2022 0.94 0.57 - 1.00 mg/dL Final         Passed - Valid encounter within last 6 months    Recent Outpatient Visits           1 month ago Primary hypertension   [place] [facility] [place], [place] W, NP   5 months ago Controlled type 2 diabetes mellitus with hyperglycemia, without long-term current use of insulin (HCC)   [place] [facility] [place], Shea Stakes, NP   9 months ago Left hip pain   [facility] [place], Shea Stakes, NP   11 months ago Encounter to establish care   [facility] [place], [place], NP              Refused Prescriptions Disp Refills   glipiZIDE (GLUCOTROL XL) 10 MG 24 hr tablet [Pharmacy Med Name: GLIPIZIDE ER 10 MG TABLET] 90 tablet 1    Sig: TAKE 1 TABLET BY MOUTH EVERY DAY     Endocrinology:  Diabetes - Sulfonylureas Passed - 10/08/2022  8:57 AM      Passed - HBA1C is between 0 and 7.9 and within 180 days    HbA1c, POC (controlled diabetic range)  Date Value Ref Range Status  04/20/2022 6.6 0.0 - 7.0 % Final         Passed  - Cr in normal range and within 360 days    Creatinine, Ser  Date Value Ref Range Status  04/20/2022 0.94 0.57 - 1.00 mg/dL Final         Passed - Valid encounter within last 6 months    Recent Outpatient Visits           1 month ago Primary hypertension   [place] [facility] [place], [place] W, NP   5 months ago Controlled type 2 diabetes mellitus with hyperglycemia, without long-term current use of insulin [facility])   [place] [facility] [place], Shea Stakes, NP   9 months ago Left hip pain   [place] [facility] [place], Shea Stakes, NP   11 months ago Encounter to establish care   [facility] [place], Shea Stakes, NP

## 2022-10-09 NOTE — Telephone Encounter (Signed)
Requested by interface surescripts. Medication discontinued 08/31/22.  Requested Prescriptions  Pending Prescriptions Disp Refills   glimepiride (AMARYL) 1 MG tablet [Pharmacy Med Name: GLIMEPIRIDE 1 MG TABLET] 30 tablet 1    Sig: TAKE 1 TABLET BY MOUTH DAILY WITH BREAKFAST.     Endocrinology:  Diabetes - Sulfonylureas Passed - 10/08/2022  8:57 AM      Passed - HBA1C is between 0 and 7.9 and within 180 days    HbA1c, POC (controlled diabetic range)  Date Value Ref Range Status  04/20/2022 6.6 0.0 - 7.0 % Final         Passed - Cr in normal range and within 360 days    Creatinine, Ser  Date Value Ref Range Status  04/20/2022 0.94 0.57 - 1.00 mg/dL Final         Passed - Valid encounter within last 6 months    Recent Outpatient Visits           1 month ago Primary hypertension   [place] [facility] [place], [place] W, NP   5 months ago Controlled type 2 diabetes mellitus with hyperglycemia, without long-term current use of insulin (HCC)   [place] [facility] [place], Shea Stakes, NP   9 months ago Left hip pain   [place] [facility] [place], Shea Stakes, NP   11 months ago Encounter to establish care   [facility] [place], [place], NP              Refused Prescriptions Disp Refills   glipiZIDE (GLUCOTROL XL) 10 MG 24 hr tablet [Pharmacy Med Name: GLIPIZIDE ER 10 MG TABLET] 90 tablet 1    Sig: TAKE 1 TABLET BY MOUTH EVERY DAY     Endocrinology:  Diabetes - Sulfonylureas Passed - 10/08/2022  8:57 AM      Passed - HBA1C is between 0 and 7.9 and within 180 days    HbA1c, POC (controlled diabetic range)  Date Value Ref Range Status  04/20/2022 6.6 0.0 - 7.0 % Final         Passed - Cr in normal range and within 360 days    Creatinine, Ser  Date Value Ref Range Status  04/20/2022 0.94 0.57 - 1.00 mg/dL Final         Passed - Valid encounter within last 6 months     Recent Outpatient Visits           1 month ago Primary hypertension   [facility] [place], [place] W, NP   5 months ago Controlled type 2 diabetes mellitus with hyperglycemia, without long-term current use of insulin [facility])   [facility] [place], Shea Stakes, NP   9 months ago Left hip pain   [place] [facility] [place], Shea Stakes, NP   11 months ago Encounter to establish care   [facility] [place], Shea Stakes, NP

## 2022-10-25 ENCOUNTER — Other Ambulatory Visit: Payer: Self-pay | Admitting: Family Medicine

## 2022-11-28 ENCOUNTER — Other Ambulatory Visit: Payer: Self-pay | Admitting: Nurse Practitioner

## 2022-11-28 DIAGNOSIS — F320 Major depressive disorder, single episode, mild: Secondary | ICD-10-CM

## 2022-11-29 NOTE — Telephone Encounter (Signed)
Rx 08/31/22 #90 1RF- too soon Requested Prescriptions  Pending Prescriptions Disp Refills   buPROPion (WELLBUTRIN XL) 150 MG 24 hr tablet [Pharmacy Med Name: BUPROPION HCL XL 150 MG TABLET] 90 tablet 1    Sig: TAKE 1 TABLET BY MOUTH EVERY DAY     Psychiatry: Antidepressants - bupropion Passed - 11/28/2022  3:24 PM      Passed - Cr in normal range and within 360 days    Creatinine, Ser  Date Value Ref Range Status  04/20/2022 0.94 0.57 - 1.00 mg/dL Final         Passed - AST in normal range and within 360 days    AST  Date Value Ref Range Status  04/20/2022 21 0 - 40 IU/L Final         Passed - ALT in normal range and within 360 days    ALT  Date Value Ref Range Status  04/20/2022 31 0 - 32 IU/L Final         Passed - Completed PHQ-2 or PHQ-9 in the last 360 days      Passed - Last BP in normal range    BP Readings from Last 1 Encounters:  08/31/22 125/81         Passed - Valid encounter within last 6 months    Recent Outpatient Visits           3 months ago Primary hypertension   [place] [facility] [place], [place] W, NP   7 months ago Controlled type 2 diabetes mellitus with hyperglycemia, without long-term current use of insulin [facility])   [place] [facility] [place], Shea Stakes, NP   10 months ago Left hip pain   [place] [facility] [place], Shea Stakes, NP   1 year ago Encounter to establish care   [facility] [place], Shea Stakes, NP       Future Appointments             [facility] [place])

## 2022-11-30 ENCOUNTER — Ambulatory Visit: Payer: No Typology Code available for payment source

## 2022-11-30 ENCOUNTER — Ambulatory Visit
Admission: RE | Admit: 2022-11-30 | Discharge: 2022-11-30 | Disposition: A | Discharge status: Home / Self Care | Payer: No Typology Code available for payment source | Source: Ambulatory Visit | Attending: Internal Medicine | Admitting: Internal Medicine

## 2022-11-30 VITALS — BP 136/72 | HR 94 | Temp 98.4°F | Resp 16

## 2022-11-30 DIAGNOSIS — R053 Chronic cough: Secondary | ICD-10-CM

## 2022-11-30 DIAGNOSIS — R059 Cough, unspecified: Secondary | ICD-10-CM | POA: Diagnosis not present

## 2022-11-30 MED ORDER — BENZONATATE 100 MG PO CAPS: 100.0000 mg | ORAL_CAPSULE | Freq: Three times a day (TID) | ORAL | 0 refills | Status: DC | PRN

## 2022-11-30 MED ORDER — PREDNISONE 20 MG PO TABS: 40.0000 mg | ORAL_TABLET | Freq: Every day | ORAL | 0 refills | Status: AC

## 2022-11-30 NOTE — Discharge Instructions (Addendum)
I will call with x-ray results.  I have prescribed you prednisone and a cough medication.  Follow-up with primary care if symptoms persist or worsen.

## 2022-11-30 NOTE — ED Provider Notes (Signed)
[facility]    CSN: [id number] Arrival date & time: 11/30/22  1735      History   Chief Complaint Chief Complaint  Patient presents with   Cough    I have been coughing since first week of August. Other than mild running nose, no other symptoms. - Entered by patient    HPI Norma [name] is a 52 y.o. female.   Patient presents with a few week history of dry coughing.  States that she has a runny nose and ear discomfort for states this is baseline for her prior to cough starting.  Denies any known sick contacts or associated fever.  Reports intermittent shortness of breath.  Denies history of asthma or COPD patient does not smoke cigarettes.  Has been taking over-the-counter cough medications and allergy medication with minimal improvement of symptoms.   Cough   Past Medical History:  Diagnosis Date   Allergy    Anemia    hx of   Anxiety    Arthritis    KNEES   Asthma    treated for during COVID tx   Chronic headache    Colon polyps    TUBULAR ADENOMAS AND HYPERPLASTIC    DDD (degenerative disc disease), thoracic    Depression    Diabetes mellitus, type 2 (HCC)    Difficult airway for intubation    Fibromyalgia    GERD (gastroesophageal reflux disease)    Goiter    Hyperlipidemia    Hypertension    Low back pain    Obesity    RA (rheumatoid arthritis) (HCC)    Seasonal allergies    Vitamin D deficiency     Patient Active Problem List   Diagnosis Date Noted   Gastritis without bleeding    Esophageal dysphagia    Schatzki's ring    Benign neoplasm of ascending colon    Chronic cough 06/27/2017   LUQ abdominal pain 12/07/2014   RUQ abdominal pain 11/10/2013   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   Gastroesophageal reflux disease 12/18/2012   Hx of adenomatous colonic polyps 12/18/2012   Chronic RLQ pain 12/18/2012   IBS (irritable bowel syndrome) 01/22/2011   HTN (hypertension) 01/22/2011   Hyperlipidemia 01/22/2011   Anxiety and  depression 01/22/2011    Past Surgical History:  Procedure Laterality Date   BACK SURGERY  2005   lumbar laminectomy   BALLOON DILATION N/A 06/01/2021   Procedure: BALLOON DILATION;  Surgeon: Beverley Fiedler, MD;  Location: [facility];  Service: Gastroenterology;  Laterality: N/A;   BARTHOLIN GLAND CYST EXCISION     BIOPSY  06/01/2021   Procedure: BIOPSY;  Surgeon: Beverley Fiedler, MD;  Location: [facility];  Service: Gastroenterology;;   COLONOSCOPY  2017   JMP-MAC-prep good-TA -recall 5 yr   COLONOSCOPY WITH PROPOFOL N/A 06/01/2021   Procedure: COLONOSCOPY WITH PROPOFOL;  Surgeon: Beverley Fiedler, MD;  Location: [facility];  Service: Gastroenterology;  Laterality: N/A;   ESOPHAGOGASTRODUODENOSCOPY (EGD) WITH PROPOFOL N/A 06/01/2021   Procedure: ESOPHAGOGASTRODUODENOSCOPY (EGD) WITH PROPOFOL;  Surgeon: Beverley Fiedler, MD;  Location: [facility];  Service: Gastroenterology;  Laterality: N/A;   LUMBAR LAMINECTOMY/DECOMPRESSION MICRODISCECTOMY Right 07/17/2013   Procedure: LUMBAR LAMINECTOMY/DECOMPRESSION MICRODISCECTOMY 1 LEVEL,RIGHT LUMBAR FOUR-FIVE;  Surgeon: Reinaldo Meeker, MD;  [facility];  Service: Neurosurgery;  Laterality: Right;  Right   PARTIAL HYSTERECTOMY     POLYPECTOMY  2017   TA and benign polypoid   POLYPECTOMY  06/01/2021   Procedure: POLYPECTOMY;  Surgeon: Beverley Fiedler, MD;  [facility];  Service: Gastroenterology;;   WISDOM TOOTH EXTRACTION      OB History   No obstetric history on file.      Home Medications    Prior to Admission medications   Medication Sig Start Date End Date Taking? Authorizing Provider  benzonatate (TESSALON) 100 MG capsule Take 1 capsule (100 mg total) by mouth every 8 (eight) hours as needed for cough. 11/30/22  Yes Nori Poland, Rolly Salter E, FNP  predniSONE (DELTASONE) 20 MG tablet Take 2 tablets (40 mg total) by mouth daily for 5 days. 11/30/22 12/05/22 Yes Gustavus Bryant, FNP  aspirin 81 MG chewable tablet Chew by mouth daily.     [provider]  buPROPion (WELLBUTRIN XL) 150 MG 24 hr tablet Take 1 tablet (150 mg total) by mouth daily. 08/31/22   Claiborne Rigg, NP  celecoxib (CELEBREX) 100 MG capsule TAKE 1 CAPSULE BY MOUTH TWICE A DAY 09/20/22   Hoy Register, MD  ELDERBERRY PO Take 1 tablet by mouth daily. As needed    [provider]  estradiol (ESTRACE) 0.5 MG tablet Take 0.5 mg by mouth daily.    [provider]  ezetimibe (ZETIA) 10 MG tablet TAKE 1 TABLET BY MOUTH EVERY DAY 10/25/22   Hoy Register, MD  glimepiride (AMARYL) 1 MG tablet TAKE 1 TABLET BY MOUTH DAILY WITH BREAKFAST. 10/10/22   Hoy Register, MD  omeprazole (PRILOSEC) 40 MG capsule TAKE 1 CAPSULE BY MOUTH EVERY DAY 09/19/22   Zehr, Shanda Bumps D, PA-C  polyethylene glycol powder (GLYCOLAX/MIRALAX) powder Dissolve 17 grams in at least 8 ounces water/juice and drink once daily. 10/26/15   Pyrtle, Carie Caddy, MD  Probiotic Product (PROBIOTIC DAILY PO) Take 1 tablet by mouth daily. [facility]    [provider]  telmisartan (MICARDIS) 80 MG tablet Take 1 tablet (80 mg total) by mouth daily. 07/13/22   Hoy Register, MD  valACYclovir (VALTREX) 500 MG tablet Take 1 tablet by mouth daily as needed (Flair up). As needed    [provider]    Family History Family History  Problem Relation Age of Onset   Diabetes Mother    Aneurysm Mother    Diabetes Father    Colon polyps Maternal Uncle    Liver disease Maternal Grandmother    Rheum arthritis Maternal Grandmother    Stroke Maternal Grandfather    Parkinson's disease Paternal Grandmother    Esophageal cancer Neg Hx    Colon cancer Neg Hx    Rectal cancer Neg Hx    Stomach cancer Neg Hx     Social History Social History   Tobacco Use   Smoking status: Never   Smokeless tobacco: Never  Vaping Use   Vaping status: Never Used  Substance Use Topics   Alcohol use: Not Currently   Drug use: Never     Allergies   Empagliflozin, Metformin, Semaglutide,  and Tizanidine   Review of Systems Review of Systems Per HPI  Physical Exam Triage Vital Signs ED Triage Vitals  Encounter Vitals Group     BP 11/30/22 1814 136/72     Systolic BP Percentile --      Diastolic BP Percentile --      Pulse Rate 11/30/22 1814 94     Resp 11/30/22 1814 16     Temp 11/30/22 1814 98.4 F (36.9 C)     Temp Source 11/30/22 1814 Oral     SpO2 11/30/22 1814 96 %  Weight --      Height --      Head Circumference --      Peak Flow --      Pain Score 11/30/22 1813 0     Pain Loc --      Pain Education --      Exclude from Growth Chart --    No data found.  Updated Vital Signs BP 136/72 (BP Location: Left Arm)   Pulse 94   Temp 98.4 F (36.9 C) (Oral)   Resp 16   SpO2 96%   Visual Acuity Right Eye Distance:   Left Eye Distance:   Bilateral Distance:    Right Eye Near:   Left Eye Near:    Bilateral Near:     Physical Exam Constitutional:      General: She is not in acute distress.    Appearance: Normal appearance. She is not toxic-appearing or diaphoretic.  HENT:     Head: Normocephalic and atraumatic.     Right Ear: Tympanic membrane and ear canal normal.     Left Ear: Tympanic membrane and ear canal normal.     Nose: No congestion.     Mouth/Throat:     Mouth: Mucous membranes are moist.     Pharynx: No posterior oropharyngeal erythema.  Eyes:     Extraocular Movements: Extraocular movements intact.     Conjunctiva/sclera: Conjunctivae normal.     Pupils: Pupils are equal, round, and reactive to light.  Cardiovascular:     Rate and Rhythm: Normal rate and regular rhythm.     Pulses: Normal pulses.     Heart sounds: Normal heart sounds.  Pulmonary:     Effort: Pulmonary effort is normal. No respiratory distress.     Breath sounds: Normal breath sounds. No stridor. No wheezing, rhonchi or rales.  Abdominal:     General: Abdomen is flat. Bowel sounds are normal.     Palpations: Abdomen is soft.  Musculoskeletal:         General: Normal range of motion.     Cervical back: Normal range of motion.  Skin:    General: Skin is warm and dry.  Neurological:     General: No focal deficit present.     Mental Status: She is alert and oriented to person, place, and time. Mental status is at baseline.  Psychiatric:        Mood and Affect: Mood normal.        Behavior: Behavior normal.      UC Treatments / Results  Labs (all labs ordered are listed, but only abnormal results are displayed) Labs Reviewed - No data to display  EKG   Radiology No results found.  Procedures Procedures (including critical care time)  Medications Ordered in UC Medications - No data to display  Initial Impression / Assessment and Plan / UC Course  I have reviewed the triage vital signs and the nursing notes.  Pertinent labs & imaging results that were available during my care of the patient were reviewed by me and considered in my medical decision making (see chart for details).     *** Final Clinical Impressions(s) / UC Diagnoses   Final diagnoses:  Persistent cough for 3 weeks or longer     Discharge Instructions      I will call with x-ray results.  I have prescribed you prednisone and a cough medication.  Follow-up with primary care if symptoms persist or worsen.   ED Prescriptions  Medication Sig Dispense Auth. Provider   predniSONE (DELTASONE) 20 MG tablet Take 2 tablets (40 mg total) by mouth daily for 5 days. 10 tablet [place], [place] E, [place]   benzonatate (TESSALON) 100 MG capsule Take 1 capsule (100 mg total) by mouth every 8 (eight) hours as needed for cough. 21 capsule [place], Acie Fredrickson, [place]      PDMP not reviewed this encounter.

## 2022-11-30 NOTE — ED Triage Notes (Signed)
Patient presents to [facility] for a non-productive cough and runny nose since August. Post nasal drainage and right ear drainage states this is a constant issue for her. Treating symptoms with OTC cough meds. Unsure if she has a fever since she is going through menopause.

## 2022-12-10 ENCOUNTER — Encounter: Payer: Self-pay | Admitting: Nurse Practitioner

## 2022-12-10 ENCOUNTER — Other Ambulatory Visit: Payer: Self-pay | Admitting: Gastroenterology

## 2022-12-12 ENCOUNTER — Other Ambulatory Visit: Payer: Self-pay | Admitting: Nurse Practitioner

## 2022-12-12 DIAGNOSIS — R053 Chronic cough: Secondary | ICD-10-CM

## 2023-01-06 ENCOUNTER — Other Ambulatory Visit: Payer: Self-pay | Admitting: Family Medicine

## 2023-01-06 DIAGNOSIS — E1165 Type 2 diabetes mellitus with hyperglycemia: Secondary | ICD-10-CM

## 2023-01-22 ENCOUNTER — Encounter: Payer: Self-pay | Admitting: Nurse Practitioner

## 2023-01-22 ENCOUNTER — Other Ambulatory Visit: Payer: Self-pay | Admitting: Family Medicine

## 2023-01-23 ENCOUNTER — Telehealth: Payer: Self-pay | Admitting: Physician Assistant

## 2023-01-23 NOTE — Telephone Encounter (Signed)
Called patient in reference to complaints of continued cough that she has been experiencing for the last 6 weeks or more. Patient states she was taking the Omprazole, starting at 40mg  BID for years, then felt like she didn't need it as much and decreased the dosage herself to daily. Patient started taking the medication again BID, with no improvement. Patient states she is not sick, although she continues to have this cough, she has also notified her PCP. Her PCP has already suggested seeing a Pulmonologist and if cough continues will be referred. She also noted some drainage along with the cough. Asked patient if she tried allergy medications and states she has and doesn't seem to work. Patient wants to try another PPI medication instead of the Omeprazole if possible. She has a scheduled appt 03/29/23 with Ms. Zerita Boers, [place].

## 2023-01-23 NOTE — Telephone Encounter (Signed)
Inbound call from patient stating that she has an appointment on 03/29/2023 at 10:00 and is requesting to speak with nurse regarding her symptoms. Requesting a call back to discuss. Please advise.

## 2023-01-25 ENCOUNTER — Other Ambulatory Visit: Payer: Self-pay | Admitting: *Deleted

## 2023-01-25 ENCOUNTER — Other Ambulatory Visit: Payer: Self-pay | Admitting: Family Medicine

## 2023-01-25 DIAGNOSIS — K219 Gastro-esophageal reflux disease without esophagitis: Secondary | ICD-10-CM

## 2023-01-25 MED ORDER — PANTOPRAZOLE SODIUM 40 MG PO TBEC: 40.0000 mg | DELAYED_RELEASE_TABLET | Freq: Two times a day (BID) | ORAL | 0 refills | Status: DC

## 2023-01-25 NOTE — Telephone Encounter (Signed)
Inbound call from patient wishing for a follow up call regarding previous note. Patient states she does not want to go in the weekend without medication. Patient is requesting a call to discuss further. Please advise, thank you.

## 2023-01-25 NOTE — Telephone Encounter (Signed)
Called to inform patient of change in medication due to complaints of the now medication Omeprazole not working. Ms. Norma [name] changed the medication to Protonix 40 mg BID. Patient called and informed of the change. Patient seemed pleased.

## 2023-01-29 ENCOUNTER — Other Ambulatory Visit: Payer: Self-pay | Admitting: Nurse Practitioner

## 2023-01-29 ENCOUNTER — Encounter: Payer: Self-pay | Admitting: Nurse Practitioner

## 2023-01-29 MED ORDER — TELMISARTAN 80 MG PO TABS: 80.0000 mg | ORAL_TABLET | Freq: Every day | ORAL | 0 refills | Status: DC

## 2023-01-31 ENCOUNTER — Other Ambulatory Visit: Payer: Self-pay | Admitting: Family Medicine

## 2023-02-01 ENCOUNTER — Other Ambulatory Visit: Payer: Self-pay | Admitting: Family Medicine

## 2023-02-01 DIAGNOSIS — E1165 Type 2 diabetes mellitus with hyperglycemia: Secondary | ICD-10-CM

## 2023-02-16 ENCOUNTER — Other Ambulatory Visit: Payer: Self-pay | Admitting: Physician Assistant

## 2023-02-16 DIAGNOSIS — K219 Gastro-esophageal reflux disease without esophagitis: Secondary | ICD-10-CM

## 2023-02-24 NOTE — Progress Notes (Unsigned)
Melonie Florida, female    DOB: Oct 02, 1970    MRN: [id number]   Brief patient profile:  31  yobf  never smoker with fall > spring  rhinitise in her 62s saw ENT/allergy says did not rec shots  referred to pulmonary clinic in [place]  02/25/2023 by Bertram Denver NP  for cough since covid   spring 2021 > cough lasted for months despite steroids/ abx/ inhaler then Aug 2024 s obvious URI day or two p commercial travel worse with  talking eating laughing    [facility]  11/30/22  rx prednisone / tessalon      History of Present Illness  02/25/2023  Pulmonary/ 1st office eval/ Venancio Chenier / [place] [facility]  Chief Complaint  Patient presents with   Consult  Dyspnea:  no change ex tol  Cough: dry hack  Sleep: flat bed / one pillow no cough  SABA use: none  02: none  Lung cancer screen:  none  Best rx to date = coricidin cold. (Diphenhydramine)  Eating lots of mints   No obvious day to day or daytime pattern/variability or assoc excess/ purulent sputum or mucus plugs or hemoptysis or cp or chest tightness, subjective wheeze or  hb symptoms as long as takes ppi every day     Also denies any obvious fluctuation of symptoms with weather or environmental changes or other aggravating or alleviating factors except as outlined above   No unusual exposure hx or h/o childhood pna/ asthma or knowledge of premature birth.  Current Allergies, Complete Past Medical History, Past Surgical History, Family History, and Social History were reviewed in [employer] record.  ROS  The following are not active complaints unless bolded Hoarseness, sore throat, dysphagia, dental problems, itching, sneezing,  nasal congestion or sensation of discharge of excess mucus or purulent secretions, ear ache,   fever, chills, sweats, unintended wt loss or wt gain, classically pleuritic or exertional cp,  orthopnea pnd or arm/hand swelling  or leg swelling, presyncope, palpitations, abdominal pain, anorexia,  nausea, vomiting, diarrhea  or change in bowel habits or change in bladder habits, change in stools or change in urine, dysuria, hematuria,  rash, arthralgias, visual complaints, headache, numbness, weakness or ataxia or problems with walking or coordination,  change in mood or  memory.            Outpatient Medications Prior to Visit  Medication Sig Dispense Refill   aspirin 81 MG chewable tablet Chew by mouth daily.     buPROPion (WELLBUTRIN XL) 150 MG 24 hr tablet Take 1 tablet (150 mg total) by mouth daily. 90 tablet 1   celecoxib (CELEBREX) 100 MG capsule TAKE 1 CAPSULE BY MOUTH TWICE A DAY 60 capsule 1   ELDERBERRY PO Take 1 tablet by mouth daily. As needed     estradiol (ESTRACE) 0.5 MG tablet Take 0.5 mg by mouth daily.     ezetimibe (ZETIA) 10 MG tablet TAKE 1 TABLET BY MOUTH EVERY DAY 90 tablet 1   glimepiride (AMARYL) 1 MG tablet TAKE 1 TABLET (1 MG TOTAL) BY MOUTH DAILY WITH BREAKFAST. MUST HAVE OFFICE VISIT FOR REFILLS 90 tablet 0   pantoprazole (PROTONIX) 40 MG tablet TAKE 1 TABLET BY MOUTH TWICE A DAY 180 tablet 1   polyethylene glycol powder (GLYCOLAX/MIRALAX) powder Dissolve 17 grams in at least 8 ounces water/juice and drink once daily. 527 g 2   Probiotic Product (PROBIOTIC DAILY PO) Take 1 tablet by mouth daily. Woman's Care  telmisartan (MICARDIS) 80 MG tablet Take 1 tablet (80 mg total) by mouth daily. Must have office visit for refills 90 tablet 0   valACYclovir (VALTREX) 500 MG tablet Take 1 tablet by mouth daily as needed (Flair up). As needed     benzonatate (TESSALON) 100 MG capsule Take 1 capsule (100 mg total) by mouth every 8 (eight) hours as needed for cough. 21 capsule 0   omeprazole (PRILOSEC) 40 MG capsule TAKE 1 CAPSULE BY MOUTH EVERY DAY 90 capsule 0   No facility-administered medications prior to visit.    Past Medical History:  Diagnosis Date   Allergy    Anemia    hx of   Anxiety    Arthritis    KNEES   Asthma    treated for during COVID tx    Chronic headache    Colon polyps    TUBULAR ADENOMAS AND HYPERPLASTIC    DDD (degenerative disc disease), thoracic    Depression    Diabetes mellitus, type 2 (HCC)    Difficult airway for intubation    Fibromyalgia    GERD (gastroesophageal reflux disease)    Goiter    Hyperlipidemia    Hypertension    Low back pain    Obesity    RA (rheumatoid arthritis) (HCC)    Seasonal allergies    Vitamin D deficiency       Objective:     BP 115/73   Pulse 80   Ht 5\' 6"  (1.676 m)   Wt 254 lb (115.2 kg)   SpO2 96%   BMI 41.00 kg/m   SpO2: 96 %  RA  Verbose pleasant amb bf   no spont cough    HEENT : Oropharynx  clear     Nasal turbinates mild non-specific edema and mucoid secretions    NECK :  without  apparent JVD/ palpable Nodes/TM    LUNGS: no acc muscle use,  Nl contour chest which is clear to A and P bilaterally without cough on insp or exp maneuvers   CV:  RRR  no s3 or murmur or increase in P2, and no edema   ABD:  soft and nontender   MS:  Gait nl   ext warm without deformities Or obvious joint restrictions  calf tenderness, cyanosis or clubbing    SKIN: warm and dry without lesions    NEURO:  alert, approp, nl sensorium with  no motor or cerebellar deficits apparent.     I personally reviewed images and agree with radiology impression as follows:  CXR:   pa and lateral  11/30/22  wnl        Assessment   Chronic cough Onset Aug 2024 best rx = diphenhydramine -  Allergy screen 02/25/2023 >  Eos 0. /  IgE   -  Max gerd rx and 1st gen H1 blockers per guidelines  02/25/2023 >>>  Of the three most common causes of  Sub-acute / recurrent or chronic cough, only one (GERD)  can actually contribute to/ trigger  the other two (asthma and post nasal drip syndrome)  and perpetuate the cylce of cough.  While not intuitively obvious, many patients with chronic low grade reflux do not cough until there is a primary insult that disturbs the protective epithelial  barrier and exposes sensitive nerve endings.   This is typically viral but can due to PNDS and  either may apply here.   >>>  The point is that once this occurs, it is difficult  to eliminate the cycle  using anything but a maximally effective acid suppression regimen at least in the short run, accompanied by an appropriate diet to address non acid GERD and control / eliminate the cough itself for at least 5-7 days with hard rock candy/ 1st gen H1 blockers per guidelines  / delsym >>> also depomedrol 120 mg IM in case of component of Th-2 driven upper or lower airways inflammation (if cough responds short term only to relapse before return while will on full rx for uacs (as above), then  that would point to allergic rhinitis/ asthma or eos bronchitis as alternative dx)    >>> f/u in 2 weeks if not 100% better.           Each maintenance medication was reviewed in detail including emphasizing most importantly the difference between maintenance and prns and under what circumstances the prns are to be triggered using an action plan format where appropriate.  Total time for H and P, chart review, counseling,   and generating customized AVS unique to this office visit / same day charting = 60  min for multiple  refractory respiratory  symptoms of uncertain etiology.            Sandrea Hughs, MD 02/25/2023

## 2023-02-25 ENCOUNTER — Ambulatory Visit (INDEPENDENT_AMBULATORY_CARE_PROVIDER_SITE_OTHER): Payer: No Typology Code available for payment source | Admitting: Internal Medicine

## 2023-02-25 ENCOUNTER — Encounter: Payer: Self-pay | Admitting: Internal Medicine

## 2023-02-25 VITALS — BP 115/73 | HR 80 | Ht 66.0 in | Wt 254.0 lb

## 2023-02-25 DIAGNOSIS — R053 Chronic cough: Secondary | ICD-10-CM

## 2023-02-25 MED ORDER — METHYLPREDNISOLONE ACETATE 80 MG/ML IJ SUSP
120.0000 mg | Freq: Once | INTRAMUSCULAR | Status: AC
  Administered 2023-02-25: 120 mg via INTRAMUSCULAR

## 2023-02-25 NOTE — Assessment & Plan Note (Signed)
Onset Aug 2024 best rx = diphenhydramine -  Allergy screen 02/25/2023 >  Eos 0. /  IgE   -  Max gerd rx and 1st gen H1 blockers per guidelines  02/25/2023 >>>  Of the three most common causes of  Sub-acute / recurrent or chronic cough, only one (GERD)  can actually contribute to/ trigger  the other two (asthma and post nasal drip syndrome)  and perpetuate the cylce of cough.  While not intuitively obvious, many patients with chronic low grade reflux do not cough until there is a primary insult that disturbs the protective epithelial barrier and exposes sensitive nerve endings.   This is typically viral but can due to PNDS and  either may apply here.   >>>  The point is that once this occurs, it is difficult to eliminate the cycle  using anything but a maximally effective acid suppression regimen at least in the short run, accompanied by an appropriate diet to address non acid GERD and control / eliminate the cough itself for at least 5-7 days with hard rock candy/ 1st gen H1 blockers per guidelines  / delsym >>> also depomedrol 120 mg IM in case of component of Th-2 driven upper or lower airways inflammation (if cough responds short term only to relapse before return while will on full rx for uacs (as above), then  that would point to allergic rhinitis/ asthma or eos bronchitis as alternative dx)    >>> f/u in 2 weeks if not 100% better.           Each maintenance medication was reviewed in detail including emphasizing most importantly the difference between maintenance and prns and under what circumstances the prns are to be triggered using an action plan format where appropriate.  Total time for H and P, chart review, counseling,   and generating customized AVS unique to this office visit / same day charting = 60  min for multiple  refractory respiratory  symptoms of uncertain etiology.

## 2023-02-25 NOTE — Patient Instructions (Addendum)
For drainage / throat tickle try take CHLORPHENIRAMINE  4 mg  ("Allergy Relief" 4mg   at [employer]  should be easiest to find in the green or blue box usually on bottom shelf)  take one every 4 hours as needed - extremely effective and inexpensive over the counter- may cause drowsiness so start with just a dose or two an hour before bedtime and see how you tolerate it before trying in daytime.   For cough > Delsym 2 tsp every 12 hours as needed but the goal is no cough at all x 5 days   Depomedrol 120 mg IM  today   Protonix 40 mg Take 30- 60 min before your first and last meals of the day    GERD (REFLUX)  is an extremely common cause of respiratory symptoms just like yours , many times with no obvious heartburn at all.    It can be treated with medication, but also with lifestyle changes including elevation of the head of your bed (ideally with 6 -8inch blocks under the headboard of your bed),  Smoking cessation, avoidance of late meals, excessive alcohol, and avoid fatty foods, chocolate, peppermint, colas, red wine, and acidic juices such as orange juice.  NO MINT OR MENTHOL PRODUCTS SO NO COUGH DROPS  USE SUGARLESS CANDY INSTEAD (Jolley ranchers or Stover's or Life Savers) or even ice chips will also do - the key is to swallow to prevent all throat clearing. NO OIL BASED VITAMINS - use powdered substitutes.  Avoid fish oil when coughing.     Please remember to go to the lab department   for your tests - we will call you with the results when they are available.  Pulmonary follow up is as needed if not all better by 1st of the year.

## 2023-02-25 NOTE — Addendum Note (Signed)
Addended by: Winn Jock on: 02/25/2023 05:05 PM   Modules accepted: Orders

## 2023-02-28 LAB — CBC WITH DIFFERENTIAL/PLATELET
Basophils Absolute: 0 10*3/uL (ref 0.0–0.2)
Basos: 1 %
EOS (ABSOLUTE): 0.1 10*3/uL (ref 0.0–0.4)
Eos: 1 %
Hematocrit: 39.9 % (ref 34.0–46.6)
Hemoglobin: 12.6 g/dL (ref 11.1–15.9)
Immature Grans (Abs): 0 10*3/uL (ref 0.0–0.1)
Immature Granulocytes: 1 %
Lymphocytes Absolute: 3 10*3/uL (ref 0.7–3.1)
Lymphs: 48 %
MCH: 27.5 pg (ref 26.6–33.0)
MCHC: 31.6 g/dL (ref 31.5–35.7)
MCV: 87 fL (ref 79–97)
Monocytes Absolute: 0.5 10*3/uL (ref 0.1–0.9)
Monocytes: 8 %
Neutrophils Absolute: 2.6 10*3/uL (ref 1.4–7.0)
Neutrophils: 41 %
Platelets: 224 10*3/uL (ref 150–450)
RBC: 4.58 x10E6/uL (ref 3.77–5.28)
RDW: 15.8 % — ABNORMAL HIGH (ref 11.7–15.4)
WBC: 6.3 10*3/uL (ref 3.4–10.8)

## 2023-02-28 LAB — IGE: IgE (Immunoglobulin E), Serum: 5 [IU]/mL — ABNORMAL LOW (ref 6–495)

## 2023-02-28 NOTE — Telephone Encounter (Signed)
Results abstracted and Care Team updated

## 2023-03-10 ENCOUNTER — Other Ambulatory Visit: Payer: Self-pay | Admitting: Gastroenterology

## 2023-03-26 ENCOUNTER — Ambulatory Visit: Payer: No Typology Code available for payment source | Attending: Nurse Practitioner | Admitting: Nurse Practitioner

## 2023-03-26 ENCOUNTER — Encounter: Payer: Self-pay | Admitting: Nurse Practitioner

## 2023-03-26 VITALS — BP 129/82 | HR 76 | Ht 66.0 in | Wt 249.4 lb

## 2023-03-26 DIAGNOSIS — E1165 Type 2 diabetes mellitus with hyperglycemia: Secondary | ICD-10-CM | POA: Diagnosis not present

## 2023-03-26 DIAGNOSIS — I10 Essential (primary) hypertension: Secondary | ICD-10-CM | POA: Diagnosis not present

## 2023-03-26 DIAGNOSIS — E781 Pure hyperglyceridemia: Secondary | ICD-10-CM

## 2023-03-26 DIAGNOSIS — R7989 Other specified abnormal findings of blood chemistry: Secondary | ICD-10-CM

## 2023-03-26 DIAGNOSIS — R053 Chronic cough: Secondary | ICD-10-CM

## 2023-03-26 DIAGNOSIS — E559 Vitamin D deficiency, unspecified: Secondary | ICD-10-CM

## 2023-03-26 DIAGNOSIS — Z7984 Long term (current) use of oral hypoglycemic drugs: Secondary | ICD-10-CM

## 2023-03-26 DIAGNOSIS — Z1231 Encounter for screening mammogram for malignant neoplasm of breast: Secondary | ICD-10-CM
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
## 2023-03-28 NOTE — Progress Notes (Signed)
03/29/2023 Norma [name] [id number] March 26, 1970  Referring provider: Claiborne Rigg, NP Primary GI doctor: Dr. Rhea Belton  ASSESSMENT AND PLAN:   Chronic Cough Persistent cough since last August, initially without other symptoms. Recent improvement with over-the-counter allergy medication. Sinus X-ray scheduled for next week. PPI did not help -Continue over-the-counter allergy medication. -Complete sinus X-ray as scheduled. - follow up ENT  Gastroesophageal Reflux Disease (GERD) Long-term use of proton pump inhibitors (PPIs), recently switched from omeprazole to pantoprazole. No significant improvement in cough after doubling PPI dose. Patient aware of different cough patterns with GERD. -Reduce pantoprazole to 20mg  daily for a month, then consider every other day or as needed. -Consider use of Pepcid or famotidine as needed for breakthrough symptoms.  Hepatic Steatosis Likely fatty liver, normal LFTs FIB 4 1.0, no fibrosis Identified on previous CT scan. Patient advised on importance of weight loss. -Check liver function and CBC every six months. -Encourage weight loss.  Gallbladder Dysfunction Previous HIDA scan showed decreased ejection fraction. Patient reports pain with significant weight loss but otherwise no symptoms. -Monitor for right upper quadrant pain, nausea, vomiting, fevers, chills, ER precautions discussed -Consider re-evaluation with CCS if symptoms persist or worsen. - low fat diet  General Health Maintenance -Schedule next colonoscopy in 2030, unless significant changes in bowel movements or anemia occur. -Continue monitoring for symptoms related to gallbladder dysfunction.    Patient Care Team: Claiborne Rigg, NP as PCP - General (Nurse Practitioner) Nyoka Cowden, MD as Consulting Physician (Pulmonary Disease) [facility]  HISTORY OF PRESENT ILLNESS: 53 y.o. female with a past medical history of HTN, GERD, history of polyps,  DM 2, chol, obesity and others listed below presents for evaluation of reflux.   11/2013 HIDA negative but borderline abnormal gallbladder ejection fraction at 30.8 06/01/2021 colonoscopy with a 3 polyp in the ascending colon and hemorrhoids and otherwise normal.  Pathology showed tubular adenoma.  Repeat recommended in 7 years. (2030)  06/01/2021 EGD with a Schatzki's ring that was dilated and gastritis.   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 pylori. 01/2022 patient continued to have epigastric discomfort almost sounding musculoskeletal in nature.  CT ab pelvis with contrast ordered by Hyacinth Meeker 11/02 that showed diffuse hepatic steatosis otherwise unremarkable gallbladder no ductal dilation normal pancreas normal spleen no bowel wall thickening or inflammatory changes no hiatal hernia. Patient called back 01/25/2023 stating omeprazole was not working switched to pantoprazole 40 mg twice daily.  Discussed the use of AI scribe software for clinical note transcription with the patient, who gave verbal consent to proceed.  History of Present Illness   The patient, with a history of fatty liver and gallbladder issues, presents with a chronic cough that has been ongoing since the previous August. The cough is persistent, with no other significant symptoms such as fever, shortness of breath, or chest pain. The patient has sought medical attention multiple times for this issue, including a visit to urgent care and a consultation with a pulmonologist. Despite various treatments, the cough has persisted. The patient has found some relief with over-the-counter allergy medication, which she has been taking regularly.  The patient is currently on pantoprazole, which was prescribed for acid reflux. The patient expresses concerns about the long-term effects of this medication on her kidneys and liver. She  has been on this medication for several years and is considering  reducing the dosage or discontinuing it.  The patient also mentions a history of fatty liver, which was identified on a CT scan. She has not experienced any symptoms related to this condition. The patient also has a history of gallbladder issues, which she reports only flare up when she loses a significant amount of weight.       She  reports that she has never smoked. She has never used smokeless tobacco. She reports current alcohol use. She reports that she does not use drugs.  RELEVANT GI HISTORY, LABS, IMAGING:  CBC    Component Value Date/Time   WBC 6.3 02/25/2023 1654   WBC 4.9 07/09/2013 1346   RBC 4.58 02/25/2023 1654   RBC 4.63 07/09/2013 1346   HGB 12.6 02/25/2023 1654   HCT 39.9 02/25/2023 1654   PLT 224 02/25/2023 1654   MCV 87 02/25/2023 1654   MCH 27.5 02/25/2023 1654   MCH 28.9 07/09/2013 1346   MCHC 31.6 02/25/2023 1654   MCHC 33.1 07/09/2013 1346   RDW 15.8 (H) 02/25/2023 1654   LYMPHSABS 3.0 02/25/2023 1654   EOSABS 0.1 02/25/2023 1654   BASOSABS 0.0 02/25/2023 1654   Recent Labs    04/20/22 1048 02/25/23 1654  HGB 12.6 12.6    CMP     Component Value Date/Time   NA 140 04/20/2022 1048   K 4.9 04/20/2022 1048   CL 102 04/20/2022 1048   CO2 22 04/20/2022 1048   GLUCOSE 113 (H) 04/20/2022 1048   GLUCOSE 165 (H) 01/04/2022 1405   BUN 15 04/20/2022 1048   CREATININE 0.94 04/20/2022 1048   CALCIUM 9.7 04/20/2022 1048   PROT 7.3 04/20/2022 1048   ALBUMIN 4.8 04/20/2022 1048   AST 21 04/20/2022 1048   ALT 31 04/20/2022 1048   ALKPHOS 97 04/20/2022 1048   BILITOT 0.4 04/20/2022 1048   GFRNONAA 70 (L) 07/09/2013 1346   GFRAA 81 (L) 07/09/2013 1346      Latest Ref Rng & Units 04/20/2022   10:48 AM 10/18/2021   10:35 AM  Hepatic Function  Total Protein 6.0 - 8.5 g/dL 7.3  7.2   Albumin 3.8 - 4.9 g/dL 4.8  4.6   AST 0 - 40 IU/L 21  20   ALT 0 - 32 IU/L 31  30   Alk Phosphatase 44 -  121 IU/L 97  91   Total Bilirubin 0.0 - 1.2 mg/dL 0.4  0.3       Current Medications:   Current Outpatient Medications (Endocrine & Metabolic):    estradiol (ESTRACE) 0.5 MG tablet, Take 0.5 mg by mouth daily.   glimepiride (AMARYL) 1 MG tablet, TAKE 1 TABLET (1 MG TOTAL) BY MOUTH DAILY WITH BREAKFAST. MUST HAVE OFFICE VISIT FOR REFILLS  Current Outpatient Medications (Cardiovascular):    ezetimibe (ZETIA) 10 MG tablet, TAKE 1 TABLET BY MOUTH EVERY DAY   telmisartan (MICARDIS) 80 MG tablet, Take 1 tablet (80 mg total) by mouth daily. Must have office visit for refills   Current Outpatient Medications (Analgesics):    aspirin 81 MG chewable tablet, Chew by mouth daily.   celecoxib (CELEBREX) 100 MG capsule, TAKE 1 CAPSULE BY MOUTH TWICE A DAY   Current Outpatient Medications (Other):    ELDERBERRY PO, Take 1 tablet by mouth daily. As needed   polyethylene glycol powder (GLYCOLAX/MIRALAX) powder, Dissolve 17 grams in at least 8 ounces water/juice and drink once daily.   Probiotic Product (PROBIOTIC DAILY PO), Take 1 tablet by mouth daily. [facility]   valACYclovir (VALTREX) 500 MG tablet, Take 1 tablet by mouth daily as needed (Flair up). As needed   pantoprazole (PROTONIX) 20 MG tablet, Take 1 tablet (20 mg total) by mouth 2 (two) times daily.  Medical History:  Past Medical History:  Diagnosis Date   Allergy    Anemia    hx of   Anxiety    Arthritis    KNEES   Asthma    treated for during COVID tx   Chronic headache    Colon polyps    TUBULAR ADENOMAS AND HYPERPLASTIC    DDD (degenerative disc disease), thoracic    Depression    Diabetes mellitus, type 2 (HCC)    Difficult airway for intubation    Fibromyalgia    GERD (gastroesophageal reflux disease)    Goiter    Hyperlipidemia    Hypertension    Low back pain    Obesity    RA (rheumatoid arthritis) (HCC)    Seasonal allergies    Vitamin D deficiency    Allergies:  Allergies  Allergen Reactions    Empagliflozin Other (See Comments)    Flank pain    Metformin Nausea Only   Semaglutide Other (See Comments)    Headache   Tizanidine Other (See Comments)    Causes nightmares     Surgical History:  She  has a past surgical history that includes Partial hysterectomy; Bartholin gland cyst excision; Back surgery (2005); Lumbar laminectomy/decompression microdiscectomy (Right, 07/17/2013); Colonoscopy (2017); Polypectomy (2017); Wisdom tooth extraction; Colonoscopy with propofol (N/A, 06/01/2021); Esophagogastroduodenoscopy (egd) with propofol (N/A, 06/01/2021); biopsy (06/01/2021); Balloon dilation (N/A, 06/01/2021); and polypectomy (06/01/2021). Family History:  Her family history includes Aneurysm in her mother; Colon polyps in her maternal uncle; Diabetes in her father and mother; Liver disease in her maternal grandmother; Parkinson's disease in her paternal grandmother; Rheum arthritis in her maternal grandmother; Stroke in her maternal grandfather.  REVIEW OF SYSTEMS  : All other systems reviewed and negative except where noted in the History of Present Illness.  PHYSICAL EXAM: BP 122/80 (BP Location: Left Arm, Patient Position: Sitting, Cuff Size: Normal)   Pulse 78   Ht 5\' 6"  (1.676 m)   Wt 248 lb 8 oz (112.7 kg)   SpO2 96%   BMI 40.11 kg/m  General Appearance: Well nourished, in no apparent distress. Head:   Normocephalic and atraumatic. Eyes:  sclerae anicteric,conjunctive pink  Respiratory: Respiratory effort normal, BS equal bilaterally without rales, rhonchi, wheezing. Cardio: RRR with no MRGs. Peripheral pulses intact.  Abdomen: Soft,  Obese ,active bowel sounds. No tenderness . Neg murphy. No masses. Rectal: Not evaluated Musculoskeletal: Full ROM, Normal gait. Without edema. Skin:  Dry and intact without significant lesions or rashes Neuro: Alert and  oriented x4;  No focal deficits. Psych:  Cooperative. Normal mood and affect.    Doree Albee, PA-C 10:34 AM

## 2023-03-29 ENCOUNTER — Other Ambulatory Visit: Payer: Self-pay | Admitting: Family Medicine

## 2023-03-29 ENCOUNTER — Encounter (INDEPENDENT_AMBULATORY_CARE_PROVIDER_SITE_OTHER): Payer: Self-pay | Admitting: Otolaryngology

## 2023-03-29 ENCOUNTER — Ambulatory Visit: Payer: No Typology Code available for payment source | Admitting: Physician Assistant

## 2023-03-29 ENCOUNTER — Encounter: Payer: Self-pay | Admitting: Physician Assistant

## 2023-03-29 VITALS — BP 122/80 | HR 78 | Ht 66.0 in | Wt 248.5 lb

## 2023-03-29 DIAGNOSIS — K760 Fatty (change of) liver, not elsewhere classified: Secondary | ICD-10-CM

## 2023-03-29 DIAGNOSIS — K219 Gastro-esophageal reflux disease without esophagitis: Secondary | ICD-10-CM | POA: Diagnosis not present

## 2023-03-29 DIAGNOSIS — K828 Other specified diseases of gallbladder: Secondary | ICD-10-CM

## 2023-03-29 DIAGNOSIS — R1013 Epigastric pain: Secondary | ICD-10-CM

## 2023-03-29 DIAGNOSIS — R053 Chronic cough: Secondary | ICD-10-CM | POA: Diagnosis not present

## 2023-03-29 DIAGNOSIS — Z860100 Personal history of colon polyps, unspecified: Secondary | ICD-10-CM

## 2023-03-29 MED ORDER — PANTOPRAZOLE SODIUM 20 MG PO TBEC: 20.0000 mg | DELAYED_RELEASE_TABLET | Freq: Two times a day (BID) | ORAL | 0 refills | Status: DC

## 2023-03-29 NOTE — Patient Instructions (Addendum)
Follow up as needed.   SUGGEST ENT REFERRAL  TAKE PANTOPRAZOLE 20 MG DAILY RATHER THAN THE 40 MG, FOR A MONTH, THEN CAN TRY EVERY OTHER DAY.  Please take this medication 30 minutes to 1 hour before meals- this makes it more effective.  PEPCID OR FAMOTIDINE CAN BE USED RATHER THAN THE PANTROPRAZOLE OR ADDED ON AS NEEDED FOR BREAK THOUGH THIS IS NOT A PPI Avoid spicy and acidic foods Avoid fatty foods Limit your intake of coffee, tea, alcohol, and carbonated drinks Work to maintain a healthy weight Keep the head of the bed elevated at least 3 inches with blocks or a wedge pillow if you are having any nighttime symptoms Stay upright for 2 hours after eating Avoid meals and snacks three to four hours before bedtime    To treat the nasal drip: CONTINUE THE ALLERGY MEDICATION GET HUMIDIFIER  To treat the reflux Continue protonix 40 mg BID  To stop irritation: Need to STOP the cough Do sugar free candy Do the tessalon drops VOICE REST is VERY important  Go to the ER or call the office if you get any chest pain, shortness of breath, severe  headache, leg swelling.    Common causes of cough OR hoarseness OR sore throat:   Allergies, Viral Infections, Acid Reflux and Bacterial Infections.    Allergies and viral infections cause a cough OR sore throat by post nasal drip and are often worse at night, can also have sneezing, lower grade fevers, clear/yellow mucus. This is best treated with allergy medications or nasal sprays.  Please get on allegra for 1-2 weeks The strongest is allegra or fexafinadine  Cheapest at [employer], [employer], [employer]   Bacterial infections are more severe than allergies or viral infections with fever, teeth pain, fatigue. This can be treated with prednisone and the same over the counter medication and after 7 days can be treated with an antibiotic.   Silent reflux/GERD can cause a cough OR sore throat OR hoarseness WITHOUT heart burn because the esophagus that goes  to the stomach and trachea that goes to the lungs are very close and when you lay down the acid can irritate your throat and lungs. This can cause hoarseness, cough, and wheezing. Please stop any alcohol or anti-inflammatories like aleve/advil/ibuprofen and start an over the counter Prilosec or omeprazole 1-2 times daily before food for 2 weeks, then switch to over the counter zantac/ratinidine or pepcid/famotadine once at night for 2 weeks.    sometimes irritation causes more irritation. Try voice rest, use sugar free cough drops to prevent coughing, and try to stop clearing your throat.   If you ever have a cough that does not go away after trying these things please make a follow up visit for further evaluation or we can refer you to a specialist. Or if you ever have shortness of breath or chest pain go to the ER.    Silent reflux: Not all heartburn burns...[name] KitchenMarland KitchenMarland Kitchen  What is LPR? Laryngopharyngeal reflux (LPR) or silent reflux is a condition in which acid that is made in the stomach travels up the esophagus (swallowing tube) and gets to the throat. Not everyone with reflux has a lot of heartburn or indigestion. In fact, many people with LPR never have heartburn. This is why LPR is called SILENT REFLUX, and the terms "Silent reflux" and "LPR" are often used interchangeably. Because LPR is silent, it is sometimes difficult to diagnose.  How can you tell if you have LPR?  Chronic hoarseness- Some people have hoarseness that comes and goes throat clearing  Cough It can cause shortness of breath and cause asthma like symptoms. a feeling of a lump in the throat  difficulty swallowing a problem with too much nose and throat drainage.  Some people will feel their esophagus spasm which feels like their heart beating hard and fast, this will usually be after a meal, at rest, or lying down at night.    How do I treat this? Treatment for LPR should be individualized, and your doctor will suggest the  best treatment for you. Generally there are several treatments for LPR: changing habits and diet to reduce reflux,  medications to reduce stomach acid, and  surgery to prevent reflux. Most people with LPR need to modify how and when they eat, as well as take some medication, to get well. Sometimes, nonprescription liquid antacids, such as Maalox, Gelucil and Mylanta are recommended. When used, these antacids should be taken four times each day - one tablespoon one hour after each meal and before bedtime. Dietary and lifestyle changes alone are not often enough to control LPR - medications that reduce stomach acid are also usually needed. These must be prescribed by our doctor. TIPS FOR REDUCING REFLUX AND LPR Control your LIFE-STYLE and your DIET! If you use tobacco, QUIT.  Smoking makes you reflux. After every cigarette you have some LPR.  Don't wear clothing that is too tight, especially around the waist (trousers, corsets, belts).  Do not lie down just after eating...in fact, do not eat within three hours of bedtime.  You should be on a low-fat diet.  Limit your intake of red meat.  Limit your intake of butter.  Avoid fried foods.  Avoid chocolate  Avoid cheese.  Avoid eggs. Specifically avoid caffeine (especially coffee and tea), soda pop (especially cola) and mints.  Avoid alcoholic beverages, particularly in the evening.  Metabolic dysfunction associated seatohepatitis  Now the leading cause of liver failure in the [place].  It is normally from such risk factors as obesity, diabetes, insulin resistance, high cholesterol, or metabolic syndrome.  The only definitive therapy is weight loss and exercise.   Suggest walking 20-30 mins daily.  Decreasing carbohydrates, increasing veggies.    Fatty Liver Fatty liver is the accumulation of fat in liver cells. It is also called hepatosteatosis or steatohepatitis. It is normal for your liver to contain some fat. If fat is more  than 5 to 10% of your liver's weight, you have fatty liver.  There are often no symptoms (problems) for years while damage is still occurring. People often learn about their fatty liver when they have medical tests for other reasons. Fat can damage your liver for years or even decades without causing problems. When it becomes severe, it can cause fatigue, weight loss, weakness, and confusion. This makes you more likely to develop more serious liver problems. The liver is the largest organ in the body. It does a lot of work and often gives no warning signs when it is sick until late in a disease. The liver has many important jobs including: Breaking down foods. Storing vitamins, iron, and other minerals. Making proteins. Making bile for food digestion. Breaking down many products including medications, alcohol and some poisons.  PROGNOSIS  Fatty liver may cause no damage or it can lead to an inflammation of the liver. This is, called steatohepatitis.  Over time the liver may become scarred and hardened. This condition is called cirrhosis.  Cirrhosis is serious and may lead to liver failure or cancer. NASH is one of the leading causes of cirrhosis. About 10-20% of Americans have fatty liver and a smaller 2-5% has NASH.  TREATMENT  Weight loss, fat restriction, and exercise in overweight patients produces inconsistent results but is worth trying. Good control of diabetes may reduce fatty liver. Eat a balanced, healthy diet. Increase your physical activity. There are no medical or surgical treatments for a fatty liver or NASH, but improving your diet and increasing your exercise may help prevent or reverse some of the damage.

## 2023-03-30 NOTE — Progress Notes (Signed)
Addendum: Reviewed and agree with assessment and management plan. Kadijah Shamoon M, MD  

## 2023-04-04 ENCOUNTER — Ambulatory Visit
Admission: RE | Admit: 2023-04-04 | Discharge: 2023-04-04 | Disposition: A | Discharge status: Home / Self Care | Payer: No Typology Code available for payment source | Source: Ambulatory Visit | Attending: Nurse Practitioner | Admitting: Nurse Practitioner

## 2023-04-04 ENCOUNTER — Ambulatory Visit: Payer: No Typology Code available for payment source | Attending: Family Medicine

## 2023-04-04 DIAGNOSIS — E1165 Type 2 diabetes mellitus with hyperglycemia: Secondary | ICD-10-CM

## 2023-04-04 DIAGNOSIS — R053 Chronic cough: Secondary | ICD-10-CM

## 2023-04-04 DIAGNOSIS — E559 Vitamin D deficiency, unspecified: Secondary | ICD-10-CM

## 2023-04-04 DIAGNOSIS — R7989 Other specified abnormal findings of blood chemistry: Secondary | ICD-10-CM

## 2023-04-04 DIAGNOSIS — E781 Pure hyperglyceridemia: Secondary | ICD-10-CM

## 2023-04-05 ENCOUNTER — Encounter: Payer: Self-pay | Admitting: Nurse Practitioner

## 2023-04-05 ENCOUNTER — Encounter (INDEPENDENT_AMBULATORY_CARE_PROVIDER_SITE_OTHER): Payer: Self-pay | Admitting: Otolaryngology

## 2023-04-05 ENCOUNTER — Other Ambulatory Visit: Payer: Self-pay | Admitting: Nurse Practitioner

## 2023-04-05 DIAGNOSIS — J3489 Other specified disorders of nose and nasal sinuses: Secondary | ICD-10-CM

## 2023-04-05 MED ORDER — AZELASTINE-FLUTICASONE 137-50 MCG/ACT NA SUSP: 1.0000 | Freq: Two times a day (BID) | NASAL | 3 refills | Status: DC

## 2023-04-05 MED ORDER — LEVOCETIRIZINE DIHYDROCHLORIDE 2.5 MG/5ML PO SOLN: 2.5000 mg | Freq: Every evening | ORAL | 11 refills | Status: DC

## 2023-04-06 LAB — CBC WITH DIFFERENTIAL/PLATELET
Basophils Absolute: 0 10*3/uL (ref 0.0–0.2)
Basos: 1 %
EOS (ABSOLUTE): 0.1 10*3/uL (ref 0.0–0.4)
Eos: 1 %
Hematocrit: 38.5 % (ref 34.0–46.6)
Hemoglobin: 12.3 g/dL (ref 11.1–15.9)
Immature Grans (Abs): 0 10*3/uL (ref 0.0–0.1)
Immature Granulocytes: 0 %
Lymphocytes Absolute: 2.4 10*3/uL (ref 0.7–3.1)
Lymphs: 45 %
MCH: 27.3 pg (ref 26.6–33.0)
MCHC: 31.9 g/dL (ref 31.5–35.7)
MCV: 85 fL (ref 79–97)
Monocytes Absolute: 0.4 10*3/uL (ref 0.1–0.9)
Monocytes: 8 %
Neutrophils Absolute: 2.4 10*3/uL (ref 1.4–7.0)
Neutrophils: 45 %
Platelets: 218 10*3/uL (ref 150–450)
RBC: 4.51 x10E6/uL (ref 3.77–5.28)
RDW: 15 % (ref 11.7–15.4)
WBC: 5.3 10*3/uL (ref 3.4–10.8)

## 2023-04-06 LAB — CMP14+EGFR
ALT: 23 [IU]/L (ref 0–32)
AST: 20 [IU]/L (ref 0–40)
Albumin: 4.6 g/dL (ref 3.8–4.9)
Alkaline Phosphatase: 74 [IU]/L (ref 44–121)
BUN/Creatinine Ratio: 17 (ref 9–23)
BUN: 18 mg/dL (ref 6–24)
Bilirubin Total: 0.3 mg/dL (ref 0.0–1.2)
CO2: 24 mmol/L (ref 20–29)
Calcium: 9.5 mg/dL (ref 8.7–10.2)
Chloride: 103 mmol/L (ref 96–106)
Creatinine, Ser: 1.07 mg/dL — ABNORMAL HIGH (ref 0.57–1.00)
Globulin, Total: 2.3 g/dL (ref 1.5–4.5)
Glucose: 100 mg/dL — ABNORMAL HIGH (ref 70–99)
Potassium: 4.9 mmol/L (ref 3.5–5.2)
Sodium: 140 mmol/L (ref 134–144)
Total Protein: 6.9 g/dL (ref 6.0–8.5)
eGFR: 62 mL/min/{1.73_m2} (ref >59)

## 2023-04-06 LAB — LIPID PANEL
Chol/HDL Ratio: 3.8 {ratio} (ref 0.0–4.4)
Cholesterol, Total: 196 mg/dL (ref 100–199)
HDL: 52 mg/dL (ref >39)
LDL Chol Calc (NIH): 119 mg/dL — ABNORMAL HIGH (ref 0–99)
Triglycerides: 141 mg/dL (ref 0–149)
VLDL Cholesterol Cal: 25 mg/dL (ref 5–40)

## 2023-04-06 LAB — HEMOGLOBIN A1C
Est. average glucose Bld gHb Est-mCnc: 157 mg/dL
Hgb A1c MFr Bld: 7.1 % — ABNORMAL HIGH (ref 4.8–5.6)

## 2023-04-06 LAB — MICROALBUMIN / CREATININE URINE RATIO
Creatinine, Urine: 139.2 mg/dL
Microalb/Creat Ratio: 22 mg/g{creat} (ref 0–29)
Microalbumin, Urine: 30.9 ug/mL

## 2023-04-06 LAB — VITAMIN D 25 HYDROXY (VIT D DEFICIENCY, FRACTURES): Vit D, 25-Hydroxy: 33.2 ng/mL (ref 30.0–100.0)

## 2023-04-11 ENCOUNTER — Encounter: Payer: Self-pay | Admitting: Nurse Practitioner

## 2023-04-19 DIAGNOSIS — Z1231 Encounter for screening mammogram for malignant neoplasm of breast: Secondary | ICD-10-CM

## 2023-04-21 ENCOUNTER — Other Ambulatory Visit: Payer: Self-pay | Admitting: Family Medicine

## 2023-04-22 ENCOUNTER — Other Ambulatory Visit: Payer: Self-pay | Admitting: Physician Assistant

## 2023-04-22 DIAGNOSIS — K219 Gastro-esophageal reflux disease without esophagitis: Secondary | ICD-10-CM

## 2023-04-22 NOTE — Telephone Encounter (Signed)
 Requested Prescriptions  Pending Prescriptions Disp Refills   ezetimibe (ZETIA) 10 MG tablet [Pharmacy Med Name: EZETIMIBE 10 MG TABLET] 90 tablet 1    Sig: TAKE 1 TABLET BY MOUTH EVERY DAY     Cardiovascular:  Antilipid - Sterol Transport Inhibitors Failed - 04/22/2023  2:53 PM      Failed - Lipid Panel in normal range within the last 12 months    Cholesterol, Total  Date Value Ref Range Status  04/04/2023 196 100 - 199 mg/dL Final   LDL Chol Calc (NIH)  Date Value Ref Range Status  04/04/2023 119 (H) 0 - 99 mg/dL Final   HDL  Date Value Ref Range Status  04/04/2023 52 >39 mg/dL Final   Triglycerides  Date Value Ref Range Status  04/04/2023 141 0 - 149 mg/dL Final         Passed - AST in normal range and within 360 days    AST  Date Value Ref Range Status  04/04/2023 20 0 - 40 IU/L Final         Passed - ALT in normal range and within 360 days    ALT  Date Value Ref Range Status  04/04/2023 23 0 - 32 IU/L Final         Passed - Patient is not pregnant      Passed - Valid encounter within last 12 months    Recent Outpatient Visits           3 weeks ago Primary hypertension   [facility] [place] [facility] [place]. [facility] Collins Dean, NP   7 months ago Primary hypertension   [facility] [place] [facility] [place]. [facility] [place], [place] W, NP   1 year ago Controlled type 2 diabetes mellitus with hyperglycemia, without long-term current use of insulin (HCC)   [facility] [facility] [place] [facility] Of [place]. [facility] Collins Dean, NP   1 year ago Left hip pain   [facility] [place] [facility] [place]. [facility] Collins Dean, NP   1 year ago Encounter to establish care   [facility] [facility] [place] - [facility] Dept Of . [facility] Collins Dean, [place]

## 2023-04-26 ENCOUNTER — Other Ambulatory Visit: Payer: Self-pay | Admitting: Nurse Practitioner

## 2023-04-26 NOTE — Telephone Encounter (Signed)
Requested by interface surescripts.  Requested Prescriptions  Pending Prescriptions Disp Refills   telmisartan (MICARDIS) 80 MG tablet [Pharmacy Med Name: TELMISARTAN 80 MG TABLET] 90 tablet 0    Sig: TAKE 1 TABLET (80 MG TOTAL) BY MOUTH DAILY. MUST HAVE OFFICE VISIT FOR REFILLS     Cardiovascular:  Angiotensin Receptor Blockers Failed - 04/26/2023  9:03 AM      Failed - Cr in normal range and within 180 days    Creatinine, Ser  Date Value Ref Range Status  04/04/2023 1.07 (H) 0.57 - 1.00 mg/dL Final         Passed - K in normal range and within 180 days    Potassium  Date Value Ref Range Status  04/04/2023 4.9 3.5 - 5.2 mmol/L Final         Passed - Patient is not pregnant      Passed - Last BP in normal range    BP Readings from Last 1 Encounters:  03/29/23 122/80         Passed - Valid encounter within last 6 months    Recent Outpatient Visits           1 month ago Primary hypertension   [facility] [place] [facility] [place]. [facility] Claiborne Rigg, NP   7 months ago Primary hypertension   [facility] [place] [facility] Dept Of [place]. [facility] [place], [place] W, NP   1 year ago Controlled type 2 diabetes mellitus with hyperglycemia, without long-term current use of insulin (HCC)   [place] [facility] Of [place]. [facility] Claiborne Rigg, NP   1 year ago Left hip pain   [place] [facility] [place] [facility] Of [place]. [facility] Claiborne Rigg, NP   1 year ago Encounter to establish care   [place] [facility] [place] - [facility] Dept Of [place]. [facility] Claiborne Rigg, [place]

## 2023-05-02 ENCOUNTER — Other Ambulatory Visit: Payer: Self-pay | Admitting: Family Medicine

## 2023-05-02 DIAGNOSIS — E1165 Type 2 diabetes mellitus with hyperglycemia: Secondary | ICD-10-CM

## 2023-05-02 NOTE — Telephone Encounter (Signed)
 Requested Prescriptions  Pending Prescriptions Disp Refills   glimepiride (AMARYL) 1 MG tablet [Pharmacy Med Name: GLIMEPIRIDE 1 MG TABLET] 90 tablet 0    Sig: TAKE 1 TABLET (1 MG TOTAL) BY MOUTH DAILY WITH BREAKFAST. MUST HAVE OFFICE VISIT FOR REFILLS     Endocrinology:  Diabetes - Sulfonylureas Failed - 05/02/2023  1:11 PM      Failed - Cr in normal range and within 360 days    Creatinine, Ser  Date Value Ref Range Status  04/04/2023 1.07 (H) 0.57 - 1.00 mg/dL Final         Passed - HBA1C is between 0 and 7.9 and within 180 days    HbA1c, POC (controlled diabetic range)  Date Value Ref Range Status  04/20/2022 6.6 0.0 - 7.0 % Final   Hgb A1c MFr Bld  Date Value Ref Range Status  04/04/2023 7.1 (H) 4.8 - 5.6 % Final    Comment:             Prediabetes: 5.7 - 6.4          Diabetes: >6.4          Glycemic control for adults with diabetes: <7.0          Passed - Valid encounter within last 6 months    Recent Outpatient Visits           1 month ago Primary hypertension   [place] [facility] [place] [facility] Of [place]. [facility] Claiborne Rigg, NP   8 months ago Primary hypertension   [place] [facility] [place] [facility] Dept Of [place]. [facility] [place], [place] W, NP   1 year ago Controlled type 2 diabetes mellitus with hyperglycemia, without long-term current use of insulin (HCC)   [place] [facility] - A Dept Of [place]. [facility] Claiborne Rigg, NP   1 year ago Left hip pain   [place] [facility] [place] - A Dept Of [place]. [facility] Claiborne Rigg, NP   1 year ago Encounter to establish care   [place] [facility] [place] - [facility] Dept Of [name]. [facility] Claiborne Rigg, [place]

## 2023-05-24 ENCOUNTER — Institutional Professional Consult (permissible substitution) (INDEPENDENT_AMBULATORY_CARE_PROVIDER_SITE_OTHER): Payer: No Typology Code available for payment source | Admitting: Otolaryngology

## 2023-05-24 ENCOUNTER — Ambulatory Visit: Payer: Self-pay | Admitting: Nurse Practitioner

## 2023-05-24 ENCOUNTER — Ambulatory Visit
Admission: RE | Admit: 2023-05-24 | Discharge: 2023-05-24 | Disposition: A | Discharge status: Home / Self Care | Source: Ambulatory Visit | Attending: Emergency Medicine | Admitting: Emergency Medicine

## 2023-05-24 VITALS — BP 138/81 | HR 71 | Temp 97.8°F | Resp 16

## 2023-05-24 DIAGNOSIS — J019 Acute sinusitis, unspecified: Secondary | ICD-10-CM | POA: Diagnosis not present

## 2023-05-24 MED ORDER — AMOXICILLIN-POT CLAVULANATE 875-125 MG PO TABS: 1.0000 | ORAL_TABLET | Freq: Two times a day (BID) | ORAL | 0 refills | Status: DC

## 2023-05-24 MED ORDER — GUAIFENESIN ER 600 MG PO TB12
600.0000 mg | ORAL_TABLET | Freq: Two times a day (BID) | ORAL | 0 refills | Status: AC
Start: 2023-05-24 — End: 2023-05-31

## 2023-05-24 NOTE — Discharge Instructions (Signed)
 Take the antibiotics twice daily with food.  Take the Mucinex twice daily as well to help loosen up secretions.  Ensure you are drinking at least 64 ounces of water, sleeping with a humidifier may help as well.  I suggest performing daily sinus rinses with filtered water to help manually loosen secretions.  Follow-up with ENT as scheduled.  Return to clinic for any new or urgent symptoms.

## 2023-05-24 NOTE — Telephone Encounter (Signed)
 Follow up with ENT on the 27th

## 2023-05-24 NOTE — ED Triage Notes (Addendum)
 Pt states she has been sick since 3/10. C/o sore throat, cough, ear pain, headache, stuffy nose, and facial pain

## 2023-05-24 NOTE — ED Provider Notes (Signed)
 [facility]    CSN: [id number] Arrival date & time: 05/24/23  1633      History   Chief Complaint Chief Complaint  Patient presents with   Nasal Congestion    Been sick since Monday 3/10 with scratchy throat, cough, chills, headache, stuffy nose, facial pain, etc. - Entered by patient    HPI ARYKA COONRADT is a 53 y.o. female.   Patient presents to clinic for complaints of sore throat, cough, bilateral ear fullness, headache, congestion, rhinorrhea and pain and pressure around her eyes.  Symptoms started around Monday when she thought she was getting better Wednesday but Thursday her symptoms returned with a vengeance.  Has been having sinus pressure constant nasal drip.  Was awaiting an ENT appointment, went there today and was turned away due to being acutely ill.  Does not think she has had any fevers.  Has had some hot and cold chills.  Has been taking Mucinex, Coricidin, azelastine nasal spray, and antihistamine and multiple other medications without any improvement.  The history is provided by the patient and medical records.    Past Medical History:  Diagnosis Date   Allergy    Anemia    hx of   Anxiety    Arthritis    KNEES   Asthma    treated for during COVID tx   Chronic headache    Colon polyps    TUBULAR ADENOMAS AND HYPERPLASTIC    DDD (degenerative disc disease), thoracic    Depression    Diabetes mellitus, type 2 (HCC)    Difficult airway for intubation    Fibromyalgia    GERD (gastroesophageal reflux disease)    Goiter    Hyperlipidemia    Hypertension    Low back pain    Obesity    RA (rheumatoid arthritis) (HCC)    Seasonal allergies    Vitamin D deficiency     Patient Active Problem List   Diagnosis Date Noted   Gastritis without bleeding    Esophageal dysphagia    Schatzki's ring    Benign neoplasm of ascending colon    Class 3 severe obesity with serious comorbidity and body mass index (BMI) of 40.0 to 44.9 in adult  (HCC) 08/25/2019   Type 2 diabetes mellitus without complication, without long-term current use of insulin (HCC) 08/25/2019   Chronic cough 06/27/2017   LUQ abdominal pain 12/07/2014   RUQ abdominal pain 11/10/2013   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   Gastroesophageal reflux disease 12/18/2012   Hx of adenomatous colonic polyps 12/18/2012   Chronic RLQ pain 12/18/2012   IBS (irritable bowel syndrome) 01/22/2011   HTN (hypertension) 01/22/2011   Hyperlipidemia 01/22/2011   Anxiety and depression 01/22/2011    Past Surgical History:  Procedure Laterality Date   BACK SURGERY  2005   lumbar laminectomy   BALLOON DILATION N/A 06/01/2021   Procedure: BALLOON DILATION;  Surgeon: Beverley Fiedler, MD;  Location: [facility];  Service: Gastroenterology;  Laterality: N/A;   BARTHOLIN GLAND CYST EXCISION     BIOPSY  06/01/2021   Procedure: BIOPSY;  Surgeon: Beverley Fiedler, MD;  Location: [facility];  Service: Gastroenterology;;   COLONOSCOPY  2017   JMP-MAC-prep good-TA -recall 5 yr   COLONOSCOPY WITH PROPOFOL N/A 06/01/2021   Procedure: COLONOSCOPY WITH PROPOFOL;  Surgeon: Beverley Fiedler, MD;  Location: [facility];  Service: Gastroenterology;  Laterality: N/A;   ESOPHAGOGASTRODUODENOSCOPY (EGD) WITH PROPOFOL N/A 06/01/2021   Procedure: ESOPHAGOGASTRODUODENOSCOPY (EGD) WITH PROPOFOL;  Surgeon: Beverley Fiedler, MD;  Location: [facility];  Service: Gastroenterology;  Laterality: N/A;   LUMBAR LAMINECTOMY/DECOMPRESSION MICRODISCECTOMY Right 07/17/2013   Procedure: LUMBAR LAMINECTOMY/DECOMPRESSION MICRODISCECTOMY 1 LEVEL,RIGHT LUMBAR FOUR-FIVE;  Surgeon: Reinaldo Meeker, MD;  Location: [facility];  Service: Neurosurgery;  Laterality: Right;  Right   PARTIAL HYSTERECTOMY     POLYPECTOMY  2017   TA and benign polypoid   POLYPECTOMY  06/01/2021   Procedure: POLYPECTOMY;  Surgeon: Beverley Fiedler, MD;  Location: [facility];  Service: Gastroenterology;;   WISDOM TOOTH EXTRACTION      OB History    No obstetric history on file.      Home Medications    Prior to Admission medications   Medication Sig Start Date End Date Taking? Authorizing Provider  amoxicillin-clavulanate (AUGMENTIN) 875-125 MG tablet Take 1 tablet by mouth every 12 (twelve) hours. 05/24/23  Yes Rinaldo Ratel, [place] N, FNP  guaiFENesin (MUCINEX) 600 MG 12 hr tablet Take 1 tablet (600 mg total) by mouth 2 (two) times daily for 7 days. 05/24/23 05/31/23 Yes Rinaldo Ratel, [place] N, FNP  aspirin 81 MG chewable tablet Chew by mouth daily.    [provider]  Azelastine-Fluticasone 137-50 MCG/ACT SUSP Place 1 spray into the nose every 12 (twelve) hours. 04/05/23   Claiborne Rigg, NP  celecoxib (CELEBREX) 100 MG capsule TAKE 1 CAPSULE BY MOUTH TWICE A DAY 03/29/23   Hoy Register, MD  ELDERBERRY PO Take 1 tablet by mouth daily. As needed    [provider]  estradiol (ESTRACE) 0.5 MG tablet Take 0.5 mg by mouth daily.    [provider]  ezetimibe (ZETIA) 10 MG tablet TAKE 1 TABLET BY MOUTH EVERY DAY 04/22/23   Claiborne Rigg, NP  glimepiride (AMARYL) 1 MG tablet TAKE 1 TABLET (1 MG TOTAL) BY MOUTH DAILY WITH BREAKFAST. MUST HAVE OFFICE VISIT FOR REFILLS 05/02/23   Hoy Register, MD  levocetirizine (XYZAL) 2.5 MG/5ML solution Take 5 mLs (2.5 mg total) by mouth every evening. 04/05/23   Claiborne Rigg, NP  pantoprazole (PROTONIX) 20 MG tablet TAKE 1 TABLET BY MOUTH TWICE A DAY 04/22/23   Quentin Mulling R, PA-C  polyethylene glycol powder (GLYCOLAX/MIRALAX) powder Dissolve 17 grams in at least 8 ounces water/juice and drink once daily. 10/26/15   Pyrtle, Carie Caddy, MD  Probiotic Product (PROBIOTIC DAILY PO) Take 1 tablet by mouth daily. [facility]    [provider]  telmisartan (MICARDIS) 80 MG tablet TAKE 1 TABLET (80 MG TOTAL) BY MOUTH DAILY. MUST HAVE OFFICE VISIT FOR REFILLS 04/26/23   Claiborne Rigg, NP  valACYclovir (VALTREX) 500 MG tablet Take 1 tablet by mouth daily as needed (Flair up).  As needed    [provider]    Family History Family History  Problem Relation Age of Onset   Diabetes Mother    Aneurysm Mother    Diabetes Father    Colon polyps Maternal Uncle    Liver disease Maternal Grandmother    Rheum arthritis Maternal Grandmother    Stroke Maternal Grandfather    Parkinson's disease Paternal Grandmother    Esophageal cancer Neg Hx    Colon cancer Neg Hx    Rectal cancer Neg Hx    Stomach cancer Neg Hx     Social History Social History   Tobacco Use   Smoking status: Never   Smokeless tobacco: Never  Vaping Use   Vaping status: Never Used  Substance Use Topics   Alcohol use:  Yes    Comment: occ   Drug use: Never     Allergies   Empagliflozin, Metformin, Semaglutide, and Tizanidine   Review of Systems Review of Systems  Per HPI  Physical Exam Triage Vital Signs ED Triage Vitals  Encounter Vitals Group     BP 05/24/23 1651 138/81     Systolic BP Percentile --      Diastolic BP Percentile --      Pulse Rate 05/24/23 1651 71     Resp 05/24/23 1651 16     Temp 05/24/23 1651 97.8 F (36.6 C)     Temp Source 05/24/23 1651 Oral     SpO2 05/24/23 1651 95 %     Weight --      Height --      Head Circumference --      Peak Flow --      Pain Score 05/24/23 1652 6     Pain Loc --      Pain Education --      Exclude from Growth Chart --    No data found.  Updated Vital Signs BP 138/81 (BP Location: Right Arm)   Pulse 71   Temp 97.8 F (36.6 C) (Oral)   Resp 16   SpO2 95%   Visual Acuity Right Eye Distance:   Left Eye Distance:   Bilateral Distance:    Right Eye Near:   Left Eye Near:    Bilateral Near:     Physical Exam Vitals and nursing note reviewed.  Constitutional:      Appearance: Normal appearance.  HENT:     Head: Normocephalic and atraumatic.     Right Ear: External ear normal. A middle ear effusion is present.     Left Ear: External ear normal. A middle ear effusion is present.     Nose:  Congestion and rhinorrhea present.     Mouth/Throat:     Mouth: Mucous membranes are moist.     Pharynx: Posterior oropharyngeal erythema present.  Eyes:     Conjunctiva/sclera: Conjunctivae normal.  Cardiovascular:     Rate and Rhythm: Normal rate and regular rhythm.     Heart sounds: Normal heart sounds. No murmur heard. Pulmonary:     Effort: Pulmonary effort is normal. No respiratory distress.     Breath sounds: Normal breath sounds.  Musculoskeletal:        General: Normal range of motion.  Skin:    General: Skin is warm and dry.  Neurological:     General: No focal deficit present.     Mental Status: She is alert.  Psychiatric:        Mood and Affect: Mood normal.        Behavior: Behavior is cooperative.      UC Treatments / Results  Labs (all labs ordered are listed, but only abnormal results are displayed) Labs Reviewed - No data to display  EKG   Radiology No results found.  Procedures Procedures (including critical care time)  Medications Ordered in UC Medications - No data to display  Initial Impression / Assessment and Plan / UC Course  I have reviewed the triage vital signs and the nursing notes.  Pertinent labs & imaging results that were available during my care of the patient were reviewed by me and considered in my medical decision making (see chart for details).  Vitals and triage reviewed, patient is hemodynamically stable.  Lungs are vesicular, heart with regular rate and rhythm.  Frontal  sinus pressure on physical exam with congestion and rhinorrhea.  Appears to have rebound of symptoms.  Symptoms consistent with bacterial sinusitis, will start on Augmentin.  Encouraged ENT follow-up as planned.  Other symptomatic management for congestion discussed.  Plan of care, follow-up care return precautions given, no questions at this time.    Final Clinical Impressions(s) / UC Diagnoses   Final diagnoses:  Acute sinusitis, recurrence not specified,  unspecified location     Discharge Instructions      Take the antibiotics twice daily with food.  Take the Mucinex twice daily as well to help loosen up secretions.  Ensure you are drinking at least 64 ounces of water, sleeping with a humidifier may help as well.  I suggest performing daily sinus rinses with filtered water to help manually loosen secretions.  Follow-up with ENT as scheduled.  Return to clinic for any new or urgent symptoms.    ED Prescriptions     Medication Sig Dispense Auth. Provider   amoxicillin-clavulanate (AUGMENTIN) 875-125 MG tablet Take 1 tablet by mouth every 12 (twelve) hours. 14 tablet Rinaldo Ratel, [place] N, [place]   guaiFENesin (MUCINEX) 600 MG 12 hr tablet Take 1 tablet (600 mg total) by mouth 2 (two) times daily for 7 days. 14 tablet Neidy Guerrieri, [place] N, [place]      PDMP not reviewed this encounter.   Fran Neiswonger, [place] N, [place] 05/24/23 1723

## 2023-05-24 NOTE — Telephone Encounter (Signed)
  Chief Complaint: cough Symptoms: cough, sore throat, headache Frequency: cough for months, but sore throat and headache since monday Pertinent Negatives: Patient denies sob, fever, chestpain Disposition: [] ED /[x] Urgent Care (no appt availability in office) / [] Appointment(In office/virtual)/ []  [facility]/ [] Home Care/ [] Refused Recommended Disposition /[] [facility]/ []  Follow-up with PCP Additional Notes: Patient states that she has had an ongoing cough for months, but on Monday she developed a sore throat, headache, nasal congestion and congestion in her ears.  State her appt with ENT was cancelled today do to her being sick. She does at times cough up a thick white and sometimes yellow phlegm. States she did also have the sweets about two night ago.     Copied From CRM [id number]. Reason for Triage: head congestion,cough sore throat,severe headache, chest burning from coughing  Reason for Disposition  Cough has been present for > 3 weeks  Answer Assessment - Initial Assessment Questions 1. ONSET: "When did the cough begin?"      Over a month ago 2. SEVERITY: "How bad is the cough today?"      Bad in spells 3. SPUTUM: "Describe the color of your sputum" (none, dry cough; clear, white, yellow, green)     Thick white to yellow 4. HEMOPTYSIS: "Are you coughing up any blood?" If so ask: "How much?" (flecks, streaks, tablespoons, etc.)     no 5. DIFFICULTY BREATHING: "Are you having difficulty breathing?" If Yes, ask: "How bad is it?" (e.g., mild, moderate, severe)    - MILD: No SOB at rest, mild SOB with walking, speaks normally in sentences, can lie down, no retractions, pulse < 100.    - MODERATE: SOB at rest, SOB with minimal exertion and prefers to sit, cannot lie down flat, speaks in phrases, mild retractions, audible wheezing, pulse 100-120.    - SEVERE: Very SOB at rest, speaks in single words, struggling to breathe, sitting hunched forward, retractions, pulse  > 120      no 6. FEVER: "Do you have a fever?" If Yes, ask: "What is your temperature, how was it measured, and when did it start?"     Didn't take but had sweats two night ago 7. CARDIAC HISTORY: "Do you have any history of heart disease?" (e.g., heart attack, congestive heart failure)      no 8. LUNG HISTORY: "Do you have any history of lung disease?"  (e.g., pulmonary embolus, asthma, emphysema)     no 9. PE RISK FACTORS: "Do you have a history of blood clots?" (or: recent major surgery, recent prolonged travel, bedridden)     no 10. OTHER SYMPTOMS: "Do you have any other symptoms?" (e.g., runny nose, wheezing, chest pain)       Nasal congestion. Headache, sore throat, also some sinus pressure.  Protocols used: Cough - Acute Productive-A-AH

## 2023-05-27 ENCOUNTER — Telehealth: Payer: Self-pay | Admitting: Nurse Practitioner

## 2023-05-27 NOTE — Telephone Encounter (Signed)
 Patient called returning call. I informed her of the note and advised follow-up with ENT on the 27th.

## 2023-05-27 NOTE — Telephone Encounter (Signed)
 Error

## 2023-05-27 NOTE — Telephone Encounter (Signed)
 Unable to reach patient by phone to relay results.  Unable to leave voicemail.

## 2023-06-05 ENCOUNTER — Other Ambulatory Visit: Payer: Self-pay | Admitting: Radiology

## 2023-06-06 ENCOUNTER — Ambulatory Visit (INDEPENDENT_AMBULATORY_CARE_PROVIDER_SITE_OTHER): Admitting: Otolaryngology

## 2023-06-06 ENCOUNTER — Encounter (INDEPENDENT_AMBULATORY_CARE_PROVIDER_SITE_OTHER): Payer: Self-pay | Admitting: Otolaryngology

## 2023-06-06 VITALS — BP 150/93 | HR 88 | Ht 66.5 in | Wt 250.0 lb

## 2023-06-06 DIAGNOSIS — J343 Hypertrophy of nasal turbinates: Secondary | ICD-10-CM

## 2023-06-06 DIAGNOSIS — R053 Chronic cough: Secondary | ICD-10-CM | POA: Diagnosis not present

## 2023-06-06 DIAGNOSIS — R0981 Nasal congestion: Secondary | ICD-10-CM

## 2023-06-06 DIAGNOSIS — K219 Gastro-esophageal reflux disease without esophagitis: Secondary | ICD-10-CM

## 2023-06-06 DIAGNOSIS — J3089 Other allergic rhinitis: Secondary | ICD-10-CM

## 2023-06-06 DIAGNOSIS — R0982 Postnasal drip: Secondary | ICD-10-CM

## 2023-06-06 DIAGNOSIS — J342 Deviated nasal septum: Secondary | ICD-10-CM

## 2023-06-06 MED ORDER — CETIRIZINE HCL 10 MG PO TABS: 10.0000 mg | ORAL_TABLET | Freq: Every day | ORAL | 11 refills | Status: DC

## 2023-06-06 MED ORDER — FLUTICASONE PROPIONATE 50 MCG/ACT NA SUSP: 2.0000 | Freq: Two times a day (BID) | NASAL | 6 refills | Status: DC

## 2023-06-06 NOTE — Patient Instructions (Signed)

## 2023-06-06 NOTE — Progress Notes (Signed)
 ENT CONSULT:  Reason for Consult: chronic cough   HPI: Discussed the use of AI scribe software for clinical note transcription with the patient, who gave verbal consent to proceed.  History of Present Illness Norma [name] is a 53 year old female who presents with chronic cough for 6 mo.  She has experienced a chronic cough since August, which began after returning from a trip during [place]. The cough is dry and persistent, not associated with an initial illness. Initial evaluations at urgent care included clear lungs on Chest x-rays. She was advised to take Mucinex and use a nasal spray, but these measures did not alleviate the cough. A pulmonologist prescribed an over-the-counter medication and suggested using [facility], which provided some relief. She also tried a decongestant/allergy pill, which reduced the cough but caused sinus pressure and headaches.  She has a history of acid reflux and is on omeprazole. The current cough differs from her typical reflux-related cough, which is more of a throat clearing. Attempts to manage the cough by increasing or changing reflux medication were unsuccessful.  Two weeks ago, she experienced a severe sinus infection, which improved after starting antibiotics. She continues to have some nasal congestion and cough but no longer has facial pain.  The cough is mostly dry and does not worsen at night. Certain foods, such as rice, popcorn, and nuts, trigger coughing episodes. 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  Her past medical history includes a diagnosis of a Schatzki's ring, which was dilated during an upper endoscopy 3 yrs ago. She has mild allergies to dust, confirmed by intradermal testing. Her allergy testing concluded that she does not require allergy shots. No significant improvement with inhaler use. Saw both GI and Pulm at this point, and was told she does not have asthma when seeing Pulm.    Records Reviewed:  GI office visit 03/29/2023 Chronic Cough Persistent cough since last August, initially without other symptoms. Recent improvement with over-the-counter allergy medication. Sinus X-ray scheduled for next week. PPI did not help -Continue over-the-counter allergy medication. -Complete sinus X-ray as scheduled. - follow up ENT   Gastroesophageal Reflux Disease (GERD) Long-term use of proton pump inhibitors (PPIs), recently switched from omeprazole to pantoprazole. No significant improvement in cough after doubling PPI dose. Patient aware of different cough patterns with GERD. -Reduce pantoprazole to 20mg  daily for a month, then consider every other day or as needed. -Consider use of Pepcid or famotidine as needed for breakthrough symptoms.  Office visit with Pulm 02/25/2023 52  yof  never smoker with fall > spring  rhinitise in her 81s saw ENT/allergy says did not rec shots  referred to pulmonary clinic in [place]  02/25/2023 by Bertram Denver NP  for cough since covid   spring 2021 > cough lasted for months despite steroids/ abx/ inhaler then Aug 2024 s obvious URI day or two p commercial travel worse with  talking eating laughing    Chronic cough Onset Aug 2024 best rx = diphenhydramine -  Allergy screen 02/25/2023 >  Eos 0. /  IgE   -  Max gerd rx and 1st gen H1 blockers per guidelines  02/25/2023 >>>  Of the three most common causes of  Sub-acute / recurrent or chronic cough, only one (GERD)  can actually contribute to/ trigger  the other two (asthma and post nasal drip syndrome)  and perpetuate the cylce of cough.   While not intuitively obvious, many patients with  chronic low grade reflux do not cough until there is a primary insult that disturbs the protective epithelial barrier and exposes sensitive nerve endings.   This is typically viral but can due to PNDS and  either may apply here.   >>>  The point is that once this occurs, it is difficult to eliminate the cycle   using anything but a maximally effective acid suppression regimen at least in the short run, accompanied by an appropriate diet to address non acid GERD and control / eliminate the cough itself for at least 5-7 days with hard rock candy/ 1st gen H1 blockers per guidelines  / delsym >>> also depomedrol 120 mg IM in case of component of Th-2 driven upper or lower airways inflammation (if cough responds short term only to relapse before return while will on full rx for uacs (as above), then  that would point to allergic rhinitis/ asthma or eos bronchitis as alternative dx)     Past Medical History:  Diagnosis Date   Allergy    Anemia    hx of   Anxiety    Arthritis    KNEES   Asthma    treated for during COVID tx   Chronic headache    Colon polyps    TUBULAR ADENOMAS AND HYPERPLASTIC    DDD (degenerative disc disease), thoracic    Depression    Diabetes mellitus, type 2 (HCC)    Difficult airway for intubation    Fibromyalgia    GERD (gastroesophageal reflux disease)    Goiter    Hyperlipidemia    Hypertension    Low back pain    Obesity    RA (rheumatoid arthritis) (HCC)    Seasonal allergies    Vitamin D deficiency     Past Surgical History:  Procedure Laterality Date   BACK SURGERY  2005   lumbar laminectomy   BALLOON DILATION N/A 06/01/2021   Procedure: BALLOON DILATION;  Surgeon: Beverley Fiedler, MD;  Location: [facility];  Service: Gastroenterology;  Laterality: N/A;   BARTHOLIN GLAND CYST EXCISION     BIOPSY  06/01/2021   Procedure: BIOPSY;  Surgeon: Beverley Fiedler, MD;  Location: [facility];  Service: Gastroenterology;;   COLONOSCOPY  2017   JMP-MAC-prep good-TA -recall 5 yr   COLONOSCOPY WITH PROPOFOL N/A 06/01/2021   Procedure: COLONOSCOPY WITH PROPOFOL;  Surgeon: Beverley Fiedler, MD;  Location: [facility];  Service: Gastroenterology;  Laterality: N/A;   ESOPHAGOGASTRODUODENOSCOPY (EGD) WITH PROPOFOL N/A 06/01/2021   Procedure: ESOPHAGOGASTRODUODENOSCOPY (EGD) WITH  PROPOFOL;  Surgeon: Beverley Fiedler, MD;  Location: [facility];  Service: Gastroenterology;  Laterality: N/A;   LUMBAR LAMINECTOMY/DECOMPRESSION MICRODISCECTOMY Right 07/17/2013   Procedure: LUMBAR LAMINECTOMY/DECOMPRESSION MICRODISCECTOMY 1 LEVEL,RIGHT LUMBAR FOUR-FIVE;  Surgeon: Reinaldo Meeker, MD;  Location: [facility];  Service: Neurosurgery;  Laterality: Right;  Right   PARTIAL HYSTERECTOMY     POLYPECTOMY  2017   TA and benign polypoid   POLYPECTOMY  06/01/2021   Procedure: POLYPECTOMY;  Surgeon: Beverley Fiedler, MD;  Location: [facility];  Service: Gastroenterology;;   WISDOM TOOTH EXTRACTION      Family History  Problem Relation Age of Onset   Diabetes Mother    Aneurysm Mother    Diabetes Father    Colon polyps Maternal Uncle    Liver disease Maternal Grandmother    Rheum arthritis Maternal Grandmother    Stroke Maternal Grandfather    Parkinson's disease Paternal Grandmother    Esophageal cancer Neg Hx  Colon cancer Neg Hx    Rectal cancer Neg Hx    Stomach cancer Neg Hx     Social History:  reports that she has never smoked. She has never used smokeless tobacco. She reports current alcohol use. She reports that she does not use drugs.  Allergies:  Allergies  Allergen Reactions   Empagliflozin Other (See Comments)    Flank pain    Metformin Nausea Only   Semaglutide Other (See Comments)    Headache   Tizanidine Other (See Comments)    Causes nightmares    Medications: I have reviewed the patient's current medications.  The PMH, PSH, Medications, Allergies, and SH were reviewed and updated.  ROS: Constitutional: Negative for fever, weight loss and weight gain. Cardiovascular: Negative for chest pain and dyspnea on exertion. Respiratory: Is not experiencing shortness of breath at rest. Gastrointestinal: Negative for nausea and vomiting. Neurological: Negative for headaches. Psychiatric: The patient is not nervous/anxious  Blood pressure (!) 150/93,  pulse 88, height 5' 6.5" (1.689 m), weight 250 lb (113.4 kg), SpO2 96%. Body mass index is 39.75 kg/m.  PHYSICAL EXAM:  Exam: General: Well-developed, well-nourished Respiratory Respiratory effort: Equal inspiration and expiration without stridor Cardiovascular Peripheral Vascular: Warm extremities with equal color/perfusion Eyes: No nystagmus with equal extraocular motion bilaterally Neuro/Psych/Balance: Patient oriented to person, place, and time; Appropriate mood and affect; Gait is intact with no imbalance; Cranial nerves I-XII are intact Head and Face Inspection: Normocephalic and atraumatic without mass or lesion Palpation: Facial skeleton intact without bony stepoffs Salivary Glands: No mass or tenderness Facial Strength: Facial motility symmetric and full bilaterally ENT Pinna: External ear intact and fully developed External canal: Canal is patent with intact skin Tympanic Membrane: Clear and mobile External Nose: No scar or anatomic deformity Internal Nose: Septum is S-shaped. No polyp, or purulence. Mucosal edema and erythema present.  Bilateral inferior turbinate hypertrophy.  Lips, Teeth, and gums: Mucosa and teeth intact and viable TMJ: No pain to palpation with full mobility Oral cavity/oropharynx: No erythema or exudate, no lesions present Nasopharynx: No mass or lesion with intact mucosa Hypopharynx: Intact mucosa without pooling of secretions Larynx Glottic: Full true vocal cord mobility without lesion or mass Supraglottic: Normal appearing epiglottis and AE folds Interarytenoid Space: Moderate pachydermia&edema Subglottic Space: Patent without lesion or edema Neck Neck and Trachea: Midline trachea without mass or lesion Thyroid: No mass or nodularity Lymphatics: No lymphadenopathy  Procedure: Preoperative diagnosis: chronic cough  Postoperative diagnosis:   Same + GERD LPR  Procedure: Flexible fiberoptic laryngoscopy  Surgeon: Ashok Croon,  MD  Anesthesia: Topical lidocaine and Afrin Complications: None Condition is stable throughout exam  Indications and consent:  The patient presents to the clinic with above symptoms. Indirect laryngoscopy view was incomplete. Thus it was recommended that they undergo a flexible fiberoptic laryngoscopy. All of the risks, benefits, and potential complications were reviewed with the patient preoperatively and verbal informed consent was obtained.  Procedure: The patient was seated upright in the clinic. Topical lidocaine and Afrin were applied to the nasal cavity. After adequate anesthesia had occurred, I then proceeded to pass the flexible telescope into the nasal cavity. The nasal cavity was patent without rhinorrhea or polyp. The nasopharynx was also patent without mass or lesion. The base of tongue was visualized and was normal. There were no signs of pooling of secretions in the piriform sinuses. The true vocal folds were mobile bilaterally. There were no signs of glottic or supraglottic mucosal lesion or mass. There was  moderate interarytenoid pachydermia and post cricoid edema. The telescope was then slowly withdrawn and the patient tolerated the procedure throughout.    PROCEDURE NOTE: nasal endoscopy  Preoperative diagnosis: chronic sinusitis symptoms  Postoperative diagnosis: same  Procedure: Diagnostic nasal endoscopy ([id number])  Surgeon: Ashok Croon, M.D.  Anesthesia: Topical lidocaine and Afrin  H&P REVIEW: The patient's history and physical were reviewed today prior to procedure. All medications were reviewed and updated as well. Complications: None Condition is stable throughout exam Indications and consent: The patient presents with symptoms of chronic sinusitis not responding to previous therapies. All the risks, benefits, and potential complications were reviewed with the patient preoperatively and informed consent was obtained. The time out was completed with confirmation  of the correct procedure.   Procedure: The patient was seated upright in the clinic. Topical lidocaine and Afrin were applied to the nasal cavity. After adequate anesthesia had occurred, the rigid nasal endoscope was passed into the nasal cavity. The nasal mucosa, turbinates, septum, and sinus drainage pathways were visualized bilaterally. This revealed no purulence or significant secretions that might be cultured. There were no polyps or sites of significant inflammation. The mucosa was intact and there was no crusting present. The scope was then slowly withdrawn and the patient tolerated the procedure well. There were no complications or blood loss.   Studies Reviewed: CXR 11/30/22 FINDINGS: The heart size and mediastinal contours are within normal limits. Both lungs are clear. The visualized skeletal structures are intact, with slight thoracic kyphodextroscoliosis, mild thoracic spondylosis and osteopenia.   IMPRESSION: No evidence of acute chest disease.  Unchanged.  04/04/23 sinuses XR FINDINGS: The paranasal sinus are aerated. There is no evidence of sinus opacification air-fluid levels or mucosal thickening. No significant bone abnormalities are seen.   IMPRESSION: Negative.  EGD 06/01/2021      Assessment/Plan: Encounter Diagnoses  Name Primary?   Chronic cough Yes   Environmental and seasonal allergies    Chronic nasal congestion    Post-nasal drip    Nasal septal deviation    Hypertrophy of both inferior nasal turbinates    Chronic GERD     Assessment and Plan Assessment & Plan Chronic Cough Chronic cough likely multifactorial and could be due to reflux, postnasal drainage, and possible neurogenic origin. She had negative CXR approximately a year ago, and already saw Pulm for her sx. She has established hx of GERD, on PPI. Considered superior laryngeal nerve block for desensitization. - Trial seaweed supplement Reflux Gourmet for reflux control. - Use Flonase and  Zyrtec for postnasal drainage. - Continue Dymista if preferred over Flonase - Educated on dietary modifications for reflux management. - Consider superior laryngeal nerve block if symptoms persist (she would like to hold off on trying it for now).  Chronic nasal congestion and post-nasal drip Postnasal drainage contributing to cough, mild allergies confirmed. - Initiate Flonase 2 puffs b/l nares and Zyrtec 10 mg daily - Continue Dymista if preferred.  Chronic Gastroesophageal Reflux Disease (GERD) GERD with reflux-related throat irritation, previous Schatzky's ring dilation. - Continue Protonix 20 mg daily for acid reduction. -  Reflux Gourmet after meals - diet and lifestyle changes to minimize GERD - Refer to [employer] blog for dietary and lifestyle modifications/reflux cook book  RTC after 64mo will consider SLN block when she returns   Thank you for allowing me to participate in the care of this patient. Please do not hesitate to contact me with any questions or concerns.   Ashok Croon, MD Otolaryngology  [facility] Phone: [phone] Fax: [phone]    06/06/2023, 3:03 PM

## 2023-06-07 ENCOUNTER — Telehealth: Payer: Self-pay | Admitting: *Deleted

## 2023-06-07 LAB — SURGICAL PATHOLOGY

## 2023-06-07 NOTE — Telephone Encounter (Signed)
 Spoke to patient to confirm upcoming morning [facility] clinic appointment on 4/2, paperwork will be sent via email.  Gave location and time, also informed patient that the surgeon's office would be calling as well to get information from them similar to the packet that they will be receiving so make sure to do both.  Reminded patient that all providers will be coming to the clinic to see them HERE and if they had any questions to not hesitate to reach back out to myself or their navigators.

## 2023-06-10 ENCOUNTER — Encounter: Payer: Self-pay | Admitting: *Deleted

## 2023-06-11 ENCOUNTER — Other Ambulatory Visit: Payer: Self-pay | Admitting: General Surgery

## 2023-06-11 ENCOUNTER — Other Ambulatory Visit: Payer: Self-pay | Admitting: *Deleted

## 2023-06-11 ENCOUNTER — Encounter: Payer: Self-pay | Admitting: Obstetrics and Gynecology

## 2023-06-11 DIAGNOSIS — C801 Malignant (primary) neoplasm, unspecified: Secondary | ICD-10-CM

## 2023-06-11 DIAGNOSIS — Z170 Estrogen receptor positive status [ER+]: Secondary | ICD-10-CM

## 2023-06-11 HISTORY — DX: Malignant (primary) neoplasm, unspecified: C80.1

## 2023-06-11 NOTE — Progress Notes (Unsigned)
 [facility] CONSULT NOTE  Patient Care Team: Claiborne Rigg, NP as PCP - General (Nurse Practitioner) Nyoka Cowden, MD as Consulting Physician (Pulmonary Disease) [facility] Donnelly Angelica, RN as Oncology Nurse Navigator Pershing Proud, RN as Oncology Nurse Navigator Almond Lint, MD as Consulting Physician (General Surgery) Rachel Moulds, MD as Consulting Physician (Hematology and Oncology) Dorothy Puffer, MD as Consulting Physician (Radiation Oncology)  CHIEF COMPLAINTS/PURPOSE OF CONSULTATION:  New diagnosis of left breast IDC.  ASSESSMENT & PLAN:  No problem-specific Assessment & Plan notes found for this encounter.  No orders of the defined types were placed in this encounter.    HISTORY OF PRESENTING ILLNESS:  Norma [name] 53 y.o. female is here because of left breast IDC  Oncology History  Malignant neoplasm of upper-outer quadrant of left breast in female, estrogen receptor positive (HCC)  04/25/2023 Mammogram   Mass in the left breast central to nipple anterior depth. Additional imaging showed 1 * 0.8*0.7 cm irregular enlarging mass in the left breast at 3 0 clock anterior depth suspicious of malignancy. US guided biopsy is recommended.   06/04/2023 Pathology Results   Left breast needle core biopsy showed grade 3 IDC, ER 100% positive, PR 70% strong staining,  Her 2 0, Ki 67 30%   06/11/2023 Initial Diagnosis   Malignant neoplasm of upper-outer quadrant of left breast in female, estrogen receptor positive (HCC)      REVIEW OF SYSTEMS:   Constitutional: Denies fevers, chills or abnormal night sweats Eyes: Denies blurriness of vision, double vision or watery eyes Ears, nose, mouth, throat, and face: Denies mucositis or sore throat Respiratory: Denies cough, dyspnea or wheezes Cardiovascular: Denies palpitation, chest discomfort or lower extremity swelling Gastrointestinal:  Denies nausea, heartburn or change in bowel  habits Skin: Denies abnormal skin rashes Lymphatics: Denies new lymphadenopathy or easy bruising Neurological:Denies numbness, tingling or new weaknesses Behavioral/Psych: Mood is stable, no new changes  All other systems were reviewed with the patient and are negative.  MEDICAL HISTORY:  Past Medical History:  Diagnosis Date   Allergy    Anemia    hx of   Anxiety    Arthritis    KNEES   Asthma    treated for during COVID tx   Chronic headache    Colon polyps    TUBULAR ADENOMAS AND HYPERPLASTIC    DDD (degenerative disc disease), thoracic    Depression    Diabetes mellitus, type 2 (HCC)    Difficult airway for intubation    Fibromyalgia    GERD (gastroesophageal reflux disease)    Goiter    Hyperlipidemia    Hypertension    Low back pain    Obesity    RA (rheumatoid arthritis) (HCC)    Seasonal allergies    Vitamin D deficiency     SURGICAL HISTORY: Past Surgical History:  Procedure Laterality Date   BACK SURGERY  2005   lumbar laminectomy   BALLOON DILATION N/A 06/01/2021   Procedure: BALLOON DILATION;  Surgeon: Beverley Fiedler, MD;  Location: [facility];  Service: Gastroenterology;  Laterality: N/A;   BARTHOLIN GLAND CYST EXCISION     BIOPSY  06/01/2021   Procedure: BIOPSY;  Surgeon: Beverley Fiedler, MD;  Location: [facility];  Service: Gastroenterology;;   COLONOSCOPY  2017   JMP-MAC-prep good-TA -recall 5 yr   COLONOSCOPY WITH PROPOFOL N/A 06/01/2021   Procedure: COLONOSCOPY WITH PROPOFOL;  Surgeon: Beverley Fiedler, MD;  Location: [facility];  Service: Gastroenterology;  Laterality: N/A;   ESOPHAGOGASTRODUODENOSCOPY (EGD) WITH PROPOFOL N/A 06/01/2021   Procedure: ESOPHAGOGASTRODUODENOSCOPY (EGD) WITH PROPOFOL;  Surgeon: Beverley Fiedler, MD;  Location: [facility];  Service: Gastroenterology;  Laterality: N/A;   LUMBAR LAMINECTOMY/DECOMPRESSION MICRODISCECTOMY Right 07/17/2013   Procedure: LUMBAR LAMINECTOMY/DECOMPRESSION MICRODISCECTOMY 1 LEVEL,RIGHT LUMBAR  FOUR-FIVE;  Surgeon: Reinaldo Meeker, MD;  Location: [facility];  Service: Neurosurgery;  Laterality: Right;  Right   PARTIAL HYSTERECTOMY     POLYPECTOMY  2017   TA and benign polypoid   POLYPECTOMY  06/01/2021   Procedure: POLYPECTOMY;  Surgeon: Beverley Fiedler, MD;  Location: [facility];  Service: Gastroenterology;;   WISDOM TOOTH EXTRACTION      SOCIAL HISTORY: Social History   Socioeconomic History   Marital status: Single    Spouse name: Not on file   Number of children: 1   Years of education: Not on file   Highest education level: Associate degree: academic program  Occupational History   Occupation: Tax adviser: [employer]  Tobacco Use   Smoking status: Never   Smokeless tobacco: Never  Vaping Use   Vaping status: Never Used  Substance and Sexual Activity   Alcohol use: Yes    Comment: occ   Drug use: Never   Sexual activity: Not on file  Other Topics Concern   Not on file  Social History Narrative   Lives at home. Her son lives with her.    Right handed   Caffeine: few times a year   Social Drivers of Corporate investment banker Strain: Low Risk  (08/30/2022)   Overall Financial Resource Strain (CARDIA)    Difficulty of Paying Living Expenses: Not hard at all  Food Insecurity: No Food Insecurity (08/30/2022)   Hunger Vital Sign    Worried About Running Out of Food in the Last Year: Never true    Ran Out of Food in the Last Year: Never true  Transportation Needs: No Transportation Needs (08/30/2022)   PRAPARE - Administrator, Civil Service (Medical): No    Lack of Transportation (Non-Medical): No  Physical Activity: Unknown (08/30/2022)   Exercise Vital Sign    Days of Exercise per Week: 0 days    Minutes of Exercise per Session: Not on file  Stress: Stress Concern Present (08/30/2022)   [employer] of Occupational Health - Occupational Stress Questionnaire    Feeling of Stress : Very much  Social Connections: Moderately  Isolated (08/30/2022)   Social Connection and Isolation Panel [NHANES]    Frequency of Communication with Friends and Family: More than three times a week    Frequency of Social Gatherings with Friends and Family: Once a week    Attends Religious Services: 1 to 4 times per year    Active Member of [employer] or Organizations: No    Attends Banker Meetings: Not on file    Marital Status: Never married  Intimate Partner Violence: Not At Risk (05/04/2021)   Received from [facility] visits prior to 05/12/2022.   Humiliation, Afraid, Rape, and Kick questionnaire    FAMILY HISTORY: Family History  Problem Relation Age of Onset   Diabetes Mother    Aneurysm Mother    Diabetes Father    Colon polyps Maternal Uncle    Liver disease Maternal Grandmother    Rheum arthritis Maternal Grandmother    Stroke Maternal Grandfather    Parkinson's disease Paternal Grandmother  Esophageal cancer Neg Hx    Colon cancer Neg Hx    Rectal cancer Neg Hx    Stomach cancer Neg Hx     ALLERGIES:  is allergic to empagliflozin, metformin, semaglutide, and tizanidine.  MEDICATIONS:  Current Outpatient Medications  Medication Sig Dispense Refill   amoxicillin-clavulanate (AUGMENTIN) 875-125 MG tablet Take 1 tablet by mouth every 12 (twelve) hours. 14 tablet 0   aspirin 81 MG chewable tablet Chew by mouth daily.     Azelastine-Fluticasone 137-50 MCG/ACT SUSP Place 1 spray into the nose every 12 (twelve) hours. 23 g 3   celecoxib (CELEBREX) 100 MG capsule TAKE 1 CAPSULE BY MOUTH TWICE A DAY 60 capsule 1   cetirizine (ZYRTEC) 10 MG tablet Take 1 tablet (10 mg total) by mouth daily. 30 tablet 11   ELDERBERRY PO Take 1 tablet by mouth daily. As needed     estradiol (ESTRACE) 0.5 MG tablet Take 0.5 mg by mouth daily.     ezetimibe (ZETIA) 10 MG tablet TAKE 1 TABLET BY MOUTH EVERY DAY 90 tablet 1   fluticasone (FLONASE) 50 MCG/ACT nasal spray Place 2 sprays into both nostrils 2 (two)  times daily. 16 g 6   glimepiride (AMARYL) 1 MG tablet TAKE 1 TABLET (1 MG TOTAL) BY MOUTH DAILY WITH BREAKFAST. MUST HAVE OFFICE VISIT FOR REFILLS 90 tablet 0   levocetirizine (XYZAL) 2.5 MG/5ML solution Take 5 mLs (2.5 mg total) by mouth every evening. 150 mL 11   pantoprazole (PROTONIX) 20 MG tablet TAKE 1 TABLET BY MOUTH TWICE A DAY 180 tablet 1   polyethylene glycol powder (GLYCOLAX/MIRALAX) powder Dissolve 17 grams in at least 8 ounces water/juice and drink once daily. 527 g 2   Probiotic Product (PROBIOTIC DAILY PO) Take 1 tablet by mouth daily. Woman's Care     telmisartan (MICARDIS) 80 MG tablet TAKE 1 TABLET (80 MG TOTAL) BY MOUTH DAILY. MUST HAVE OFFICE VISIT FOR REFILLS 90 tablet 0   valACYclovir (VALTREX) 500 MG tablet Take 1 tablet by mouth daily as needed (Flair up). As needed     No current facility-administered medications for this visit.     PHYSICAL EXAMINATION: ECOG PERFORMANCE STATUS: {CHL ONC ECOG PS:[phone]}  There were no vitals filed for this visit. There were no vitals filed for this visit.  GENERAL:alert, no distress and comfortable SKIN: skin color, texture, turgor are normal, no rashes or significant lesions EYES: normal, conjunctiva are pink and non-injected, sclera clear OROPHARYNX:no exudate, no erythema and lips, buccal mucosa, and tongue normal  NECK: supple, thyroid normal size, non-tender, without nodularity LYMPH:  no palpable lymphadenopathy in the cervical, axillary or inguinal LUNGS: clear to auscultation and percussion with normal breathing effort HEART: regular rate & rhythm and no murmurs and no lower extremity edema ABDOMEN:abdomen soft, non-tender and normal bowel sounds Musculoskeletal:no cyanosis of digits and no clubbing  PSYCH: alert & oriented x 3 with fluent speech NEURO: no focal motor/sensory deficits  LABORATORY DATA:  I have reviewed the data as listed Lab Results  Component Value Date   WBC 5.3 04/04/2023   HGB 12.3  04/04/2023   HCT 38.5 04/04/2023   MCV 85 04/04/2023   PLT 218 04/04/2023     Chemistry      Component Value Date/Time   NA 140 04/04/2023 0839   K 4.9 04/04/2023 0839   CL 103 04/04/2023 0839   CO2 24 04/04/2023 0839   BUN 18 04/04/2023 0839   CREATININE 1.07 (H) 04/04/2023 3474  Component Value Date/Time   CALCIUM 9.5 04/04/2023 0839   ALKPHOS 74 04/04/2023 0839   AST 20 04/04/2023 0839   ALT 23 04/04/2023 0839   BILITOT 0.3 04/04/2023 0839       RADIOGRAPHIC STUDIES: I have personally reviewed the radiological images as listed and agreed with the findings in the report. No results found.  All questions were answered. The patient knows to call the clinic with any problems, questions or concerns. I spent 45 minutes in the care of this patient including H and P, review of records, counseling and coordination of care.     Rachel Moulds, MD 06/11/2023 6:16 PM

## 2023-06-11 NOTE — Assessment & Plan Note (Signed)
 This is a very pleasant 53 yr old female patient with newly diagnosed left breast IDC, ER positive, PR positive, Her 2 neg referred to breast MDC for additional recommendations. 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 Treatment contingent on Oncotype DX results. - Perform Oncotype DX testing post-surgery to assess tumor genetics and determine the need for chemotherapy. - If chemotherapy is needed, administer TC regimen (two chemo drugs every 21 days for four cycles). - If chemotherapy is not needed, proceed with radiation therapy. - Post-radiation, initiate tamoxifen therapy for five years if premenopausal. - Discuss potential side effects of tamoxifen, including menopausal symptoms such as hot flashes, vaginal discharge, mood swings, and sleep disturbances, DVT/PE, endometrial hyperplasia, endometrial carcinoma and benefit on bone density. - Consider cold cap therapy to mitigate hair loss if chemotherapy is required.  Lipoma of the Chest Wall Lipoma present in the chest wall, removal desired during breast cancer surgery. - Discuss with the surgeon the possibility of removing the lipoma during the breast cancer surgery.  Hypertension Hypertension managed with medication.  Follow-up Follow-up required post-surgery for Oncotype DX results and treatment planning. Regular follow-ups during tamoxifen therapy to monitor for recurrence. - Schedule follow-up appointment 2-3 weeks post-surgery to discuss Oncotype DX results and subsequent treatment plan. - Conduct regular follow-up visits every six months during the five-year tamoxifen therapy period, including history, physical exam, and blood tests, MRD testing to monitor for cancer recurrence. - Ensure regular mammograms and prescription management during follow-up visits.

## 2023-06-11 NOTE — Progress Notes (Incomplete)
 Radiation Oncology         [phone] ________________________________  Name: Norma [name]        MRN: [id number]  Date of Service: 06/12/2023 DOB: [birth year]  [id number], Norma Stakes, NP  Almond Lint, MD     REFERRING PHYSICIAN: Almond Lint, MD   DIAGNOSIS: There were no encounter diagnoses. ***   HISTORY OF PRESENT ILLNESS: Norma [name] is a 53 y.o. female seen in the multidisciplinary breast clinic for a new diagnosis of *** breast cancer. The patient was noted to have ***. She proceeded with diagnostic mammogram and ultrasound on *** that showed ***. Accordingly, patient underwent a biopsy on *** that revealed intermediate grade invasive ductal carcinoma that was ER and PR *** and HER2 *** with a Ki-67 ***.   She is seen today to discuss treatment recommendations of her cancer.      PREVIOUS RADIATION THERAPY: {EXAM; YES/NO:19492::"No"}   PAST MEDICAL HISTORY:  Past Medical History:  Diagnosis Date   Allergy    Anemia    hx of   Anxiety    Arthritis    KNEES   Asthma    treated for during COVID tx   Chronic headache    Colon polyps    TUBULAR ADENOMAS AND HYPERPLASTIC    DDD (degenerative disc disease), thoracic    Depression    Diabetes mellitus, type 2 (HCC)    Difficult airway for intubation    Fibromyalgia    GERD (gastroesophageal reflux disease)    Goiter    Hyperlipidemia    Hypertension    Low back pain    Obesity    RA (rheumatoid arthritis) (HCC)    Seasonal allergies    Vitamin D deficiency        PAST SURGICAL HISTORY: Past Surgical History:  Procedure Laterality Date   BACK SURGERY  2005   lumbar laminectomy   BALLOON DILATION N/A 06/01/2021   Procedure: BALLOON DILATION;  Surgeon: Beverley Fiedler, MD;  Location: [facility];  Service: Gastroenterology;  Laterality: N/A;   BARTHOLIN GLAND CYST EXCISION     BIOPSY  06/01/2021   Procedure: BIOPSY;  Surgeon: Beverley Fiedler, MD;  Location: [facility];  Service: Gastroenterology;;    COLONOSCOPY  2017   JMP-MAC-prep good-TA -recall 5 yr   COLONOSCOPY WITH PROPOFOL N/A 06/01/2021   Procedure: COLONOSCOPY WITH PROPOFOL;  Surgeon: Beverley Fiedler, MD;  Location: [facility];  Service: Gastroenterology;  Laterality: N/A;   ESOPHAGOGASTRODUODENOSCOPY (EGD) WITH PROPOFOL N/A 06/01/2021   Procedure: ESOPHAGOGASTRODUODENOSCOPY (EGD) WITH PROPOFOL;  Surgeon: Beverley Fiedler, MD;  Location: [facility];  Service: Gastroenterology;  Laterality: N/A;   LUMBAR LAMINECTOMY/DECOMPRESSION MICRODISCECTOMY Right 07/17/2013   Procedure: LUMBAR LAMINECTOMY/DECOMPRESSION MICRODISCECTOMY 1 LEVEL,RIGHT LUMBAR FOUR-FIVE;  Surgeon: Reinaldo Meeker, MD;  Location: [facility];  Service: Neurosurgery;  Laterality: Right;  Right   PARTIAL HYSTERECTOMY     POLYPECTOMY  2017   TA and benign polypoid   POLYPECTOMY  06/01/2021   Procedure: POLYPECTOMY;  Surgeon: Beverley Fiedler, MD;  Location: [facility];  Service: Gastroenterology;;   WISDOM TOOTH EXTRACTION       FAMILY HISTORY:  Family History  Problem Relation Age of Onset   Diabetes Mother    Aneurysm Mother    Diabetes Father    Colon polyps Maternal Uncle    Liver disease Maternal Grandmother    Rheum arthritis Maternal Grandmother    Stroke Maternal Grandfather    Parkinson's disease Paternal Grandmother  Esophageal cancer Neg Hx    Colon cancer Neg Hx    Rectal cancer Neg Hx    Stomach cancer Neg Hx      SOCIAL HISTORY:  reports that she has never smoked. She has never used smokeless tobacco. She reports current alcohol use. She reports that she does not use drugs.   ALLERGIES: Empagliflozin, Metformin, Semaglutide, and Tizanidine   MEDICATIONS:  Current Outpatient Medications  Medication Sig Dispense Refill   amoxicillin-clavulanate (AUGMENTIN) 875-125 MG tablet Take 1 tablet by mouth every 12 (twelve) hours. 14 tablet 0   aspirin 81 MG chewable tablet Chew by mouth daily.     Azelastine-Fluticasone 137-50 MCG/ACT SUSP  Place 1 spray into the nose every 12 (twelve) hours. 23 g 3   celecoxib (CELEBREX) 100 MG capsule TAKE 1 CAPSULE BY MOUTH TWICE A DAY 60 capsule 1   cetirizine (ZYRTEC) 10 MG tablet Take 1 tablet (10 mg total) by mouth daily. 30 tablet 11   ELDERBERRY PO Take 1 tablet by mouth daily. As needed     estradiol (ESTRACE) 0.5 MG tablet Take 0.5 mg by mouth daily.     ezetimibe (ZETIA) 10 MG tablet TAKE 1 TABLET BY MOUTH EVERY DAY 90 tablet 1   fluticasone (FLONASE) 50 MCG/ACT nasal spray Place 2 sprays into both nostrils 2 (two) times daily. 16 g 6   glimepiride (AMARYL) 1 MG tablet TAKE 1 TABLET (1 MG TOTAL) BY MOUTH DAILY WITH BREAKFAST. MUST HAVE OFFICE VISIT FOR REFILLS 90 tablet 0   levocetirizine (XYZAL) 2.5 MG/5ML solution Take 5 mLs (2.5 mg total) by mouth every evening. 150 mL 11   pantoprazole (PROTONIX) 20 MG tablet TAKE 1 TABLET BY MOUTH TWICE A DAY 180 tablet 1   polyethylene glycol powder (GLYCOLAX/MIRALAX) powder Dissolve 17 grams in at least 8 ounces water/juice and drink once daily. 527 g 2   Probiotic Product (PROBIOTIC DAILY PO) Take 1 tablet by mouth daily. Woman's Care     telmisartan (MICARDIS) 80 MG tablet TAKE 1 TABLET (80 MG TOTAL) BY MOUTH DAILY. MUST HAVE OFFICE VISIT FOR REFILLS 90 tablet 0   valACYclovir (VALTREX) 500 MG tablet Take 1 tablet by mouth daily as needed (Flair up). As needed     No current facility-administered medications for this visit.     REVIEW OF SYSTEMS: On review of systems, the patient reports that she is doing well overall. No breast specific complaints are verbalized.  ***      PHYSICAL EXAM:  Wt Readings from Last 3 Encounters:  06/06/23 250 lb (113.4 kg)  03/29/23 248 lb 8 oz (112.7 kg)  03/26/23 249 lb 6.4 oz (113.1 kg)   Temp Readings from Last 3 Encounters:  05/24/23 97.8 F (36.6 C) (Oral)  11/30/22 98.4 F (36.9 C) (Oral)  01/05/22 98 F (36.7 C) (Temporal)   BP Readings from Last 3 Encounters:  06/06/23 (!) 150/93   05/24/23 138/81  03/29/23 122/80   Pulse Readings from Last 3 Encounters:  06/06/23 88  05/24/23 71  03/29/23 78    In general this is a well appearing *** female in no acute distress. She's alert and oriented x4 and appropriate throughout the examination. Cardiopulmonary assessment is negative for acute distress and she exhibits normal effort. Bilateral breast exam is deferred.    ECOG = ***  0 - Asymptomatic (Fully active, able to carry on all predisease activities without restriction)  1 - Symptomatic but completely ambulatory (Restricted in physically strenuous activity but  ambulatory and able to carry out work of a light or sedentary nature. For example, light housework, office work)  2 - Symptomatic, <50% in bed during the day (Ambulatory and capable of all self care but unable to carry out any work activities. Up and about more than 50% of waking hours)  3 - Symptomatic, >50% in bed, but not bedbound (Capable of only limited self-care, confined to bed or chair 50% or more of waking hours)  4 - Bedbound (Completely disabled. Cannot carry on any self-care. Totally confined to bed or chair)  5 - Death   Santiago Glad MM, Creech RH, Tormey DC, et al. [phone]). "Toxicity and response criteria of the [facility]". Am. Evlyn Clines. Oncol. 5 (6): 649-55    LABORATORY DATA:  Lab Results  Component Value Date   WBC 5.3 04/04/2023   HGB 12.3 04/04/2023   HCT 38.5 04/04/2023   MCV 85 04/04/2023   PLT 218 04/04/2023   Lab Results  Component Value Date   NA 140 04/04/2023   K 4.9 04/04/2023   CL 103 04/04/2023   CO2 24 04/04/2023   Lab Results  Component Value Date   ALT 23 04/04/2023   AST 20 04/04/2023   ALKPHOS 74 04/04/2023   BILITOT 0.3 04/04/2023      RADIOGRAPHY: No results found.     IMPRESSION/PLAN: 1. *** Dr. Mitzi Hansen discussed the pathology findings and reviewed the nature of ***. The consensus from the breast conference includes ***. Dr. Mitzi Hansen  recommends external beam radiotherapy to the breast following her ***lumpectomy to reduce risks of local recurrence followed by antiestrogen therapy. We discussed the risks, benefits, short, and long term effects of radiotherapy, as well as the *** intent, and the patient is interested in proceeding. Dr. Mitzi Hansen discussed the delivery and logistics of radiotherapy and anticipates a course of *** weeks of radiotherapy to the *** breast with *** deep inspiration breath hold technique. We will see her back a few weeks after surgery to discuss the simulation process and anticipate starting radiotherapy about 4-6 weeks after surgery.   2. Possible genetic predisposition to malignancy. The patient is a candidate for genetic testing given *** personal and family history. She will meet with our geneticist today in clinic.   In a visit lasting *** minutes, greater than 50% of the time was spent face to face reviewing her case, as well as in preparation of, discussing, and coordinating the patient's care.  The above documentation reflects my direct findings during this shared patient visit. Please see the separate note by Dr. Mitzi Hansen on this date for the remainder of the patient's plan of care.    Joyice Faster, [place]    **Disclaimer: This note was dictated with voice recognition software. Similar sounding words can inadvertently be transcribed and this note may contain transcription errors which may not have been corrected upon publication of note.**

## 2023-06-12 ENCOUNTER — Encounter: Payer: Self-pay | Admitting: Hematology and Oncology

## 2023-06-12 ENCOUNTER — Inpatient Hospital Stay: Attending: Hematology and Oncology

## 2023-06-12 ENCOUNTER — Encounter: Payer: Self-pay | Admitting: Physical Therapy

## 2023-06-12 ENCOUNTER — Ambulatory Visit
Admission: RE | Admit: 2023-06-12 | Discharge: 2023-06-12 | Disposition: A | Discharge status: Home / Self Care | Source: Ambulatory Visit | Attending: Radiation Oncology | Admitting: Radiation Oncology

## 2023-06-12 ENCOUNTER — Inpatient Hospital Stay: Admitting: Licensed Clinical Social Worker

## 2023-06-12 ENCOUNTER — Encounter: Payer: Self-pay | Admitting: *Deleted

## 2023-06-12 ENCOUNTER — Other Ambulatory Visit: Payer: Self-pay

## 2023-06-12 ENCOUNTER — Encounter: Payer: Self-pay | Admitting: Genetic Counselor

## 2023-06-12 ENCOUNTER — Other Ambulatory Visit: Payer: Self-pay | Admitting: General Surgery

## 2023-06-12 ENCOUNTER — Inpatient Hospital Stay (HOSPITAL_BASED_OUTPATIENT_CLINIC_OR_DEPARTMENT_OTHER): Admitting: Hematology and Oncology

## 2023-06-12 ENCOUNTER — Ambulatory Visit: Attending: General Surgery | Admitting: Physical Therapy

## 2023-06-12 ENCOUNTER — Other Ambulatory Visit: Payer: Self-pay | Admitting: *Deleted

## 2023-06-12 ENCOUNTER — Inpatient Hospital Stay (HOSPITAL_BASED_OUTPATIENT_CLINIC_OR_DEPARTMENT_OTHER): Admitting: Genetic Counselor

## 2023-06-12 VITALS — BP 154/81 | HR 101 | Temp 97.7°F | Resp 16 | Wt 259.0 lb

## 2023-06-12 DIAGNOSIS — Z170 Estrogen receptor positive status [ER+]: Secondary | ICD-10-CM

## 2023-06-12 DIAGNOSIS — Z1721 Progesterone receptor positive status: Secondary | ICD-10-CM

## 2023-06-12 DIAGNOSIS — I10 Essential (primary) hypertension: Secondary | ICD-10-CM

## 2023-06-12 DIAGNOSIS — Z79899 Other long term (current) drug therapy: Secondary | ICD-10-CM | POA: Diagnosis not present

## 2023-06-12 DIAGNOSIS — C50412 Malignant neoplasm of upper-outer quadrant of left female breast: Secondary | ICD-10-CM

## 2023-06-12 DIAGNOSIS — D171 Benign lipomatous neoplasm of skin and subcutaneous tissue of trunk: Secondary | ICD-10-CM | POA: Diagnosis not present

## 2023-06-12 DIAGNOSIS — Z1732 Human epidermal growth factor receptor 2 negative status: Secondary | ICD-10-CM

## 2023-06-12 DIAGNOSIS — R293 Abnormal posture: Secondary | ICD-10-CM | POA: Diagnosis present

## 2023-06-12 LAB — CMP (CANCER CENTER ONLY)
ALT: 30 U/L (ref 0–44)
AST: 23 U/L (ref 15–41)
Albumin: 4.4 g/dL (ref 3.5–5.0)
Alkaline Phosphatase: 63 U/L (ref 38–126)
Anion gap: 5 (ref 5–15)
BUN: 14 mg/dL (ref 6–20)
CO2: 30 mmol/L (ref 22–32)
Calcium: 9.4 mg/dL (ref 8.9–10.3)
Chloride: 104 mmol/L (ref 98–111)
Creatinine: 0.86 mg/dL (ref 0.44–1.00)
GFR, Estimated: >60 mL/min (ref >60)
Glucose, Bld: 123 mg/dL — ABNORMAL HIGH (ref 70–99)
Potassium: 4.1 mmol/L (ref 3.5–5.1)
Sodium: 139 mmol/L (ref 135–145)
Total Bilirubin: 0.4 mg/dL (ref 0.0–1.2)
Total Protein: 7.3 g/dL (ref 6.5–8.1)

## 2023-06-12 LAB — CBC WITH DIFFERENTIAL (CANCER CENTER ONLY)
Abs Immature Granulocytes: 0.03 10*3/uL (ref 0.00–0.07)
Basophils Absolute: 0 10*3/uL (ref 0.0–0.1)
Basophils Relative: 1 %
Eosinophils Absolute: 0.1 10*3/uL (ref 0.0–0.5)
Eosinophils Relative: 1 %
HCT: 38.1 % (ref 36.0–46.0)
Hemoglobin: 12.3 g/dL (ref 12.0–15.0)
Immature Granulocytes: 1 %
Lymphocytes Relative: 44 %
Lymphs Abs: 2.4 10*3/uL (ref 0.7–4.0)
MCH: 27.6 pg (ref 26.0–34.0)
MCHC: 32.3 g/dL (ref 30.0–36.0)
MCV: 85.6 fL (ref 80.0–100.0)
Monocytes Absolute: 0.4 10*3/uL (ref 0.1–1.0)
Monocytes Relative: 8 %
Neutro Abs: 2.5 10*3/uL (ref 1.7–7.7)
Neutrophils Relative %: 45 %
Platelet Count: 205 10*3/uL (ref 150–400)
RBC: 4.45 MIL/uL (ref 3.87–5.11)
RDW: 14.8 % (ref 11.5–15.5)
WBC Count: 5.5 10*3/uL (ref 4.0–10.5)
nRBC: 0 % (ref 0.0–0.2)

## 2023-06-12 LAB — RESEARCH LABS

## 2023-06-12 LAB — GENETIC SCREENING ORDER

## 2023-06-12 NOTE — Research (Signed)
 [employer] 2021-05 - Specimen Collection Study to Evaluate Biomarkers in Subjects with Cancer     This Nurse has reviewed this patient's inclusion and exclusion criteria as a second review and confirms Norma [name] is eligible for study participation.  Patient may continue with enrollment.   Janan Ridge RN, BSN, CCRP Clinical Research Nurse Lead 06/12/2023 11:58 AM

## 2023-06-12 NOTE — Therapy (Signed)
 OUTPATIENT PHYSICAL THERAPY BREAST CANCER BASELINE EVALUATION   Patient Name: Norma [name] MRN: [id number] DOB:Dec 05, 1970, 53 y.o., female Today's Date: 06/12/2023  END OF SESSION:  PT End of Session - 06/12/23 1013     Visit Number 1    Number of Visits 2    Date for PT Re-Evaluation 08/07/23    PT Start Time 1007    PT Stop Time 1012   Also saw pt from 1028 to 1100 for a total of 37 minutes   PT Time Calculation (min) 5 min    Activity Tolerance Patient tolerated treatment well    Behavior During Therapy WFL for tasks assessed/performed             Past Medical History:  Diagnosis Date   Allergy    Anemia    hx of   Anxiety    Arthritis    KNEES   Asthma    treated for during COVID tx   Chronic headache    Colon polyps    TUBULAR ADENOMAS AND HYPERPLASTIC    DDD (degenerative disc disease), thoracic    Depression    Diabetes mellitus, type 2 (HCC)    Difficult airway for intubation    Fibromyalgia    GERD (gastroesophageal reflux disease)    Goiter    Hyperlipidemia    Hypertension    Low back pain    Obesity    RA (rheumatoid arthritis) (HCC)    Seasonal allergies    Vitamin D deficiency    Past Surgical History:  Procedure Laterality Date   BACK SURGERY  2005   lumbar laminectomy   BALLOON DILATION N/A 06/01/2021   Procedure: BALLOON DILATION;  Surgeon: Beverley Fiedler, MD;  Location: [facility];  Service: Gastroenterology;  Laterality: N/A;   BARTHOLIN GLAND CYST EXCISION     BIOPSY  06/01/2021   Procedure: BIOPSY;  Surgeon: Beverley Fiedler, MD;  Location: [facility];  Service: Gastroenterology;;   COLONOSCOPY  2017   JMP-MAC-prep good-TA -recall 5 yr   COLONOSCOPY WITH PROPOFOL N/A 06/01/2021   Procedure: COLONOSCOPY WITH PROPOFOL;  Surgeon: Beverley Fiedler, MD;  Location: [facility];  Service: Gastroenterology;  Laterality: N/A;   ESOPHAGOGASTRODUODENOSCOPY (EGD) WITH PROPOFOL N/A 06/01/2021   Procedure: ESOPHAGOGASTRODUODENOSCOPY (EGD) WITH  PROPOFOL;  Surgeon: Beverley Fiedler, MD;  Location: [facility];  Service: Gastroenterology;  Laterality: N/A;   LUMBAR LAMINECTOMY/DECOMPRESSION MICRODISCECTOMY Right 07/17/2013   Procedure: LUMBAR LAMINECTOMY/DECOMPRESSION MICRODISCECTOMY 1 LEVEL,RIGHT LUMBAR FOUR-FIVE;  Surgeon: Reinaldo Meeker, MD;  Location: [facility];  Service: Neurosurgery;  Laterality: Right;  Right   PARTIAL HYSTERECTOMY     POLYPECTOMY  2017   TA and benign polypoid   POLYPECTOMY  06/01/2021   Procedure: POLYPECTOMY;  Surgeon: Beverley Fiedler, MD;  Location: [facility];  Service: Gastroenterology;;   WISDOM TOOTH EXTRACTION     Patient Active Problem List   Diagnosis Date Noted   Malignant neoplasm of upper-outer quadrant of left breast in female, estrogen receptor positive (HCC) 06/11/2023   Gastritis without bleeding    Esophageal dysphagia    Schatzki's ring    Benign neoplasm of ascending colon    Class 3 severe obesity with serious comorbidity and body mass index (BMI) of 40.0 to 44.9 in adult [facility]) 08/25/2019   Type 2 diabetes mellitus without complication, without long-term current use of insulin (HCC) 08/25/2019   Chronic cough 06/27/2017   LUQ abdominal pain 12/07/2014   RUQ abdominal pain 11/10/2013  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   Gastroesophageal reflux disease 12/18/2012   Hx of adenomatous colonic polyps 12/18/2012   Chronic RLQ pain 12/18/2012   IBS (irritable bowel syndrome) 01/22/2011   HTN (hypertension) 01/22/2011   Hyperlipidemia 01/22/2011   Anxiety and depression 01/22/2011    REFERRING PROVIDER: Dr. Almond Lint  REFERRING DIAG: Left breast cancer  THERAPY DIAG:  Malignant neoplasm of upper-outer quadrant of left breast in female, estrogen receptor positive (HCC)  Abnormal posture  Rationale for Evaluation and Treatment: Rehabilitation  ONSET DATE: 04/26/2023  SUBJECTIVE:                                                                                                                                                                                            SUBJECTIVE STATEMENT: Patient reports she is here today to be seen by her medical team for her newly diagnosed left breast cancer.   PERTINENT HISTORY:  Patient was diagnosed on 04/26/2023 with left grade 3 invasive ductal carcinoma breast cancer. It measures 1 cm and is located in the upper outer quadrant. It is ER/PR positive and HER2 negative with a Ki67 of 30%.   PATIENT GOALS:   reduce lymphedema risk and learn post op HEP.   PAIN:  Are you having pain? Yes: NPRS scale: 2-6/10 Pain location: left shoulder Pain description: arthritis; achy Aggravating factors: weather Relieving factors: unknown  PRECAUTIONS: Active CA   RED FLAGS: None   HAND DOMINANCE: right  WEIGHT BEARING RESTRICTIONS: No  FALLS:  Has patient fallen in last 6 months? No  LIVING ENVIRONMENT: Patient lives with: her adult son Lives in: House/apartment Has following equipment at home: None  OCCUPATION: Ship broker  LEISURE: She does not exercise  PRIOR LEVEL OF FUNCTION: Independent   OBJECTIVE: Note: Objective measures were completed at Evaluation unless otherwise noted.  COGNITION: Overall cognitive status: Within functional limits for tasks assessed    POSTURE:  Forward head and rounded shoulders posture  UPPER EXTREMITY AROM/PROM:  A/PROM RIGHT   eval   Shoulder extension 48  Shoulder flexion 153  Shoulder abduction 165  Shoulder internal rotation 63  Shoulder external rotation 84    (Blank rows = not tested)  A/PROM LEFT   eval  Shoulder extension 35  Shoulder flexion 150  Shoulder abduction 167  Shoulder internal rotation 63  Shoulder external rotation 75    (Blank rows = not tested)  CERVICAL AROM: All within normal limits  UPPER EXTREMITY STRENGTH: WFL  LYMPHEDEMA ASSESSMENTS (in cm):   LANDMARK RIGHT   eval  10 cm proximal to olecranon process 38.8   Olecranon process 28.7  10 cm proximal to ulnar styloid process 25.8  Just proximal to ulnar styloid process 17  Across hand at thumb web space 20.5  At base of 2nd digit 5.9  (Blank rows = not tested)  LANDMARK LEFT   eval  10 cm proximal to olecranon process 40  Olecranon process 28.8  10 cm proximal to ulnar styloid process 24.3  Just proximal to ulnar styloid process 16.3  Across hand at thumb web space 19.5  At base of 2nd digit 5.9  (Blank rows = not tested)  L-DEX LYMPHEDEMA SCREENING:  The patient was assessed using the L-Dex machine today to produce a lymphedema index baseline score. The patient will be reassessed on a regular basis (typically every 3 months) to obtain new L-Dex scores. If the score is > 6.5 points away from his/her baseline score indicating onset of subclinical lymphedema, it will be recommended to wear a compression garment for 4 weeks, 12 hours per day and then be reassessed. If the score continues to be > 6.5 points from baseline at reassessment, we will initiate lymphedema treatment. Assessing in this manner has a 95% rate of preventing clinically significant lymphedema.   L-DEX FLOWSHEETS - 06/12/23 1000       L-DEX LYMPHEDEMA SCREENING   Measurement Type Unilateral    L-DEX MEASUREMENT EXTREMITY Upper Extremity    POSITION  Standing    DOMINANT SIDE Right    At Risk Side Left    BASELINE SCORE (UNILATERAL) -0.7             QUICK DASH SURVEY:  Neldon Mc - 06/12/23 0001     Open a tight or new jar Severe difficulty    Do heavy household chores (wash walls, wash floors) No difficulty    Carry a shopping bag or briefcase No difficulty    Wash your back Mild difficulty    Use a knife to cut food No difficulty    Recreational activities in which you take some force or impact through your arm, shoulder, or hand (golf, hammering, tennis) Mild difficulty    During the past week, to what extent has your arm, shoulder or hand problem interfered  with your normal social activities with family, friends, neighbors, or groups? Not at all    During the past week, to what extent has your arm, shoulder or hand problem limited your work or other regular daily activities Not at all    Arm, shoulder, or hand pain. Mild    Tingling (pins and needles) in your arm, shoulder, or hand None    Difficulty Sleeping No difficulty    DASH Score 13.64 %              PATIENT EDUCATION:  Education details: Time spent educating patient on aspects of self-care to maximize post op recovery. Patient was educated on where and how to get a post op compression bra to use to reduce post op edema. Patient was also educated on the use of SOZO screenings and surveillance principles for early identification of lymphedema onset. She was instructed to use the post op pillow in the axilla for pressure and pain relief. Patient educated on lymphedema risk reduction and post op shoulder/posture HEP. Person educated: Patient Education method: Explanation, Demonstration, Handout Education comprehension: Patient verbalized understanding and returned demonstration  HOME EXERCISE PROGRAM: Patient was instructed today in a home exercise program today for post op shoulder range of motion. These included active assist shoulder flexion in sitting, scapular retraction, wall  walking with shoulder abduction, and hands behind head external rotation.  She was encouraged to do these twice a day, holding 3 seconds and repeating 5 times when permitted by her physician.   ASSESSMENT:  CLINICAL IMPRESSION: Patient was diagnosed on 04/26/2023 with left grade 3 invasive ductal carcinoma breast cancer. It measures 1 cm and is located in the upper outer quadrant. It is ER/PR positive and HER2 negative with a Ki67 of 30%. Her multidisciplinary medical team met prior to her assessments to determine a recommended treatment plan. She is planning to have a left lumpectomy and sentinel node biopsy  followed by Oncotype testing, radiation, and anti-estrogen therapy. She will benefit from a post op PT reassessment to determine needs and from L-Dex screens every 3 months for 2 years to detect subclinical lymphedema.  Pt will benefit from skilled therapeutic intervention to improve on the following deficits: Decreased knowledge of precautions, impaired UE functional use, pain, decreased ROM, postural dysfunction.   PT treatment/interventions: ADL/self-care home management, pt/family education, therapeutic exercise  REHAB POTENTIAL: Excellent  CLINICAL DECISION MAKING: Stable/uncomplicated  EVALUATION COMPLEXITY: Low   GOALS: Goals reviewed with patient? YES  LONG TERM GOALS: (STG=LTG)    Name Target Date Goal status  1 Pt will be able to verbalize understanding of pertinent lymphedema risk reduction practices relevant to her dx specifically related to skin care.  Baseline:  No knowledge 06/12/2023 Achieved at eval  2 Pt will be able to return demo and/or verbalize understanding of the post op HEP related to regaining shoulder ROM. Baseline:  No knowledge 06/12/2023 Achieved at eval  3 Pt will be able to verbalize understanding of the importance of viewing the post op After Breast CA Class video for further lymphedema risk reduction education and therapeutic exercise.  Baseline:  No knowledge 06/12/2023 Achieved at eval  4 Pt will demo she has regained full shoulder ROM and function post operatively compared to baselines.  Baseline: See objective measurements taken today. 08/07/2023     PLAN:  PT FREQUENCY/DURATION: EVAL and 1 follow up appointment.   PLAN FOR NEXT SESSION: will reassess 3-4 weeks post op to determine needs.   Patient will follow up at outpatient cancer rehab 3-4 weeks following surgery.  If the patient requires physical therapy at that time, a specific plan will be dictated and sent to the referring physician for approval. The patient was educated today on appropriate  basic range of motion exercises to begin post operatively and the importance of viewing the After Breast Cancer class video following surgery.  Patient was educated today on lymphedema risk reduction practices as it pertains to recommendations that will benefit the patient immediately following surgery.  She verbalized good understanding.    Physical Therapy Information for After Breast Cancer Surgery/Treatment:  Lymphedema is a swelling condition that you may be at risk for in your arm if you have lymph nodes removed from the armpit area.  After a sentinel node biopsy, the risk is approximately 5-9% and is higher after an axillary node dissection.  There is treatment available for this condition and it is not life-threatening.  Contact your physician or physical therapist with concerns. You may begin the 4 shoulder/posture exercises (see additional sheet) when permitted by your physician (typically a week after surgery).  If you have drains, you may need to wait until those are removed before beginning range of motion exercises.  A general recommendation is to not lift your arms above shoulder height until drains are removed.  These exercises should be done to your tolerance and gently.  This is not a "no pain/no gain" type of recovery so listen to your body and stretch into the range of motion that you can tolerate, stopping if you have pain.  If you are having immediate reconstruction, ask your plastic surgeon about doing exercises as he or she may want you to wait. We encourage you to attend the free one time ABC (After Breast Cancer) class offered by [facility].  You will learn information related to lymphedema risk, prevention and treatment and additional exercises to regain mobility following surgery.  You can call [phone] for more information.  This is offered the 1st and 3rd Monday of each month.  You only attend the class one time. While undergoing any medical procedure  or treatment, try to avoid blood pressure being taken or needle sticks from occurring on the arm on the side of cancer.   This recommendation begins after surgery and continues for the rest of your life.  This may help reduce your risk of getting lymphedema (swelling in your arm). An excellent resource for those seeking information on lymphedema is the National Lymphedema Network's web site. It can be accessed at www.lymphnet.org If you notice swelling in your hand, arm or breast at any time following surgery (even if it is many years from now), please contact your doctor or physical therapist to discuss this.  Lymphedema can be treated at any time but it is easier for you if it is treated early on.  If you feel like your shoulder motion is not returning to normal in a reasonable amount of time, please contact your surgeon or physical therapist.  [facility] [phone]. [address], [place] [address]  ABC CLASS After Breast Cancer Class  After Breast Cancer Class is a specially designed exercise class video to assist you in a safe recover after having breast cancer surgery.  In this video you will learn how to get back to full function whether your drains were just removed or if you had surgery a month ago. The video can be viewed on this page: [email or web address]-meyer.[email or web address]/ or on YouTube here: https://[email or web address].  Class Goals  Understand specific stretches to improve the flexibility of you chest and shoulder. Learn ways to safely strengthen your upper body and improve your posture. Understand the warning signs of infection and why you may be at risk for an arm infection. Learn about Lymphedema and prevention.  ** You do not need to view this video until after surgery.  Drains should be removed to participate in the recommended exercises on the video.  Patient was instructed today  in a home exercise program today for post op shoulder range of motion. These included active assist shoulder flexion in sitting, scapular retraction, wall walking with shoulder abduction, and hands behind head external rotation.  She was encouraged to do these twice a day, holding 3 seconds and repeating 5 times when permitted by her physician.  Bethann Punches, [place] 06/12/23 11:23 AM

## 2023-06-12 NOTE — Progress Notes (Signed)
 REFERRING PROVIDER: Rachel Moulds, MD [address] [place],  [place] [address]  PRIMARY PROVIDER:  Claiborne Rigg, NP  PRIMARY REASON FOR VISIT:  1. Malignant neoplasm of upper-outer quadrant of left breast in female, estrogen receptor positive (HCC)      HISTORY OF PRESENT ILLNESS:   Norma [name], a 53 y.o. female, was seen for a [facility] cancer genetics consultation at the request of Dr. Al Pimple due to a personal history of breast cancer.  Norma [name] presents to clinic today to discuss the possibility of a hereditary predisposition to cancer, genetic testing, and to further clarify her future cancer risks, as well as potential cancer risks for family members.   In February 2025, at the age of 88, Norma [name] was diagnosed with breast cancer of the left breast.  She has a history of colon polyps starting at age 69.  She has been found to have 8 tubular adenomas and 2 hyperplastic polyps.  CANCER HISTORY:  Oncology History  Malignant neoplasm of upper-outer quadrant of left breast in female, estrogen receptor positive (HCC)  04/25/2023 Mammogram   Mass in the left breast central to nipple anterior depth. Additional imaging showed 1 * 0.8*0.7 cm irregular enlarging mass in the left breast at 3 0 clock anterior depth suspicious of malignancy. US guided biopsy is recommended.   06/04/2023 Pathology Results   Left breast needle core biopsy showed grade 3 IDC, ER 100% positive, PR 70% strong staining,  Her 2 0, Ki 67 30%   06/11/2023 Initial Diagnosis   Malignant neoplasm of upper-outer quadrant of left breast in female, estrogen receptor positive (HCC)      RISK FACTORS:  Menarche was at age 89.  First live birth at age 69.  OCP use for approximately  19  years.  Ovaries intact: yes.  Hysterectomy: yes.  Menopausal status: postmenopausal.  HRT use: 2 years. Colonoscopy: yes; polyps. Mammogram within the last year: yes.[name] Kitchen Up to date with pelvic exams: yes.   Past Medical History:   Diagnosis Date   Allergy    Anemia    hx of   Anxiety    Arthritis    KNEES   Asthma    treated for during COVID tx   Chronic headache    Colon polyps    TUBULAR ADENOMAS AND HYPERPLASTIC    DDD (degenerative disc disease), thoracic    Depression    Diabetes mellitus, type 2 (HCC)    Difficult airway for intubation    Fibromyalgia    GERD (gastroesophageal reflux disease)    Goiter    Hyperlipidemia    Hypertension    Low back pain    Obesity    RA (rheumatoid arthritis) (HCC)    Seasonal allergies    Vitamin D deficiency     Past Surgical History:  Procedure Laterality Date   BACK SURGERY  2005   lumbar laminectomy   BALLOON DILATION N/A 06/01/2021   Procedure: BALLOON DILATION;  Surgeon: Beverley Fiedler, MD;  [facility];  Service: Gastroenterology;  Laterality: N/A;   BARTHOLIN GLAND CYST EXCISION     BIOPSY  06/01/2021   Procedure: BIOPSY;  Surgeon: Beverley Fiedler, MD;  Location: [facility];  Service: Gastroenterology;;   COLONOSCOPY  2017   JMP-MAC-prep good-TA -recall 5 yr   COLONOSCOPY WITH PROPOFOL N/A 06/01/2021   Procedure: COLONOSCOPY WITH PROPOFOL;  Surgeon: Beverley Fiedler, MD;  Location: [facility];  Service: Gastroenterology;  Laterality: N/A;   ESOPHAGOGASTRODUODENOSCOPY (  EGD) WITH PROPOFOL N/A 06/01/2021   Procedure: ESOPHAGOGASTRODUODENOSCOPY (EGD) WITH PROPOFOL;  Surgeon: Beverley Fiedler, MD;  Location: [facility];  Service: Gastroenterology;  Laterality: N/A;   LUMBAR LAMINECTOMY/DECOMPRESSION MICRODISCECTOMY Right 07/17/2013   Procedure: LUMBAR LAMINECTOMY/DECOMPRESSION MICRODISCECTOMY 1 LEVEL,RIGHT LUMBAR FOUR-FIVE;  Surgeon: Reinaldo Meeker, MD;  Location: [facility];  Service: Neurosurgery;  Laterality: Right;  Right   PARTIAL HYSTERECTOMY     POLYPECTOMY  2017   TA and benign polypoid   POLYPECTOMY  06/01/2021   Procedure: POLYPECTOMY;  Surgeon: Beverley Fiedler, MD;  Location: [facility];  Service: Gastroenterology;;   WISDOM TOOTH  EXTRACTION      Social History   Socioeconomic History   Marital status: Single    Spouse name: Not on file   Number of children: 1   Years of education: Not on file   Highest education level: Associate degree: academic program  Occupational History   Occupation: Tax adviser: [employer]  Tobacco Use   Smoking status: Never   Smokeless tobacco: Never  Vaping Use   Vaping status: Never Used  Substance and Sexual Activity   Alcohol use: Yes    Comment: occ   Drug use: Never   Sexual activity: Not on file  Other Topics Concern   Not on file  Social History Narrative   Lives at home. Her son lives with her.    Right handed   Caffeine: few times a year   Social Drivers of Corporate investment banker Strain: Low Risk  (08/30/2022)   Overall Financial Resource Strain (CARDIA)    Difficulty of Paying Living Expenses: Not hard at all  Food Insecurity: No Food Insecurity (08/30/2022)   Hunger Vital Sign    Worried About Running Out of Food in the Last Year: Never true    Ran Out of Food in the Last Year: Never true  Transportation Needs: No Transportation Needs (08/30/2022)   PRAPARE - Administrator, Civil Service (Medical): No    Lack of Transportation (Non-Medical): No  Physical Activity: Unknown (08/30/2022)   Exercise Vital Sign    Days of Exercise per Week: 0 days    Minutes of Exercise per Session: Not on file  Stress: Stress Concern Present (08/30/2022)   [employer] of Occupational Health - Occupational Stress Questionnaire    Feeling of Stress : Very much  Social Connections: Moderately Isolated (08/30/2022)   Social Connection and Isolation Panel [NHANES]    Frequency of Communication with Friends and Family: More than three times a week    Frequency of Social Gatherings with Friends and Family: Once a week    Attends Religious Services: 1 to 4 times per year    Active Member of [employer] or Organizations: No    Attends Hospital doctor: Not on file    Marital Status: Never married     FAMILY HISTORY:  We obtained a detailed, 4-generation family history.  Significant diagnoses are listed below: Family History  Problem Relation Age of Onset   Diabetes Mother    Aneurysm Mother    Diabetes Father    Colon polyps Maternal Uncle    Liver disease Maternal Grandmother    Rheum arthritis Maternal Grandmother    Stroke Maternal Grandfather    Parkinson's disease Paternal Grandmother    Esophageal cancer Neg Hx    Colon cancer Neg Hx    Rectal cancer Neg Hx    Stomach cancer Neg  Hx      The patient has one son who is cancer free.  She has two paternal half sisters who are cancer free.  Her mother is deceased and her father is living.  The patient does not have information on the paternal side of the family.  The patient's mother died at 35 from a cerebral hemorrhage.  She had multiple siblings who were not reported to have cancer.  Norma [name] is unaware of previous family history of genetic testing for hereditary cancer risks. There is no reported Ashkenazi Jewish ancestry. There is no known consanguinity.  GENETIC COUNSELING ASSESSMENT: Norma [name] is a 53 y.o. female with a personal history of cancer and a total of 10 colon polyps which is somewhat suggestive of a hereditary cancer syndrome and predisposition to cancer given her breast cancer and colon polyps. We, therefore, discussed and recommended the following at today's visit.   DISCUSSION: We discussed that, in general, most cancer is not inherited in families, but instead is sporadic or familial. Sporadic cancers occur by chance and typically happen at older ages (>50 years) as this type of cancer is caused by genetic changes acquired during an individual's lifetime. Some families have more cancers than would be expected by chance; however, the ages or types of cancer are not consistent with a known genetic mutation or known genetic mutations have been ruled  out. This type of familial cancer is thought to be due to a combination of multiple genetic, environmental, hormonal, and lifestyle factors. While this combination of factors likely increases the risk of cancer, the exact source of this risk is not currently identifiable or testable.  We discussed that 5 - 10% of breast cancer is hereditary, with most cases associated with BRCA mutations.  There are other genes that can be associated with hereditary breast cancer syndromes.  These include ATM, CHEK2 and PALB2.  We discussed that testing is beneficial for several reasons including knowing how to follow individuals after completing their treatment, identifying whether potential treatment options such as PARP inhibitors would be beneficial, and understand if other family members could be at risk for cancer and allow them to undergo genetic testing.   We reviewed the characteristics, features and inheritance patterns of hereditary cancer syndromes. We also discussed genetic testing, including the appropriate family members to test, the process of testing, insurance coverage and turn-around-time for results. We discussed the implications of a negative, positive and/or variant of uncertain significant result. In order to get genetic test results in a timely manner so that Norma [name] can use these genetic test results for surgical decisions, we recommended Norma [name] pursue genetic testing for the BRCAPlus. Once complete, we recommend Norma [name] pursue reflex genetic testing to the CancerNext+RNAinsight gene panel.   Based on Norma [name]'s personal and family history of cancer, she meets the breast surgeon (BrS) medical criteria for genetic testing for breast cancer. Despite that she meets criteria, she may still have an out of pocket cost.  PLAN: After considering the risks, benefits, and limitations, Norma [name] provided informed consent to pursue genetic testing and the blood sample was sent to [facility] for analysis of the CancerNext+RNA. Results should be available within approximately 2-3 weeks' time, at which point they will be disclosed by telephone to Norma [name], as will any additional recommendations warranted by these results. Norma [name] will receive a summary of her genetic counseling visit and a copy of her results once available. This information will also  be available in Epic.   Lastly, we encouraged Norma [name] to remain in contact with cancer genetics annually so that we can continuously update the family history and inform her of any changes in cancer genetics and testing that may be of benefit for this family.   Norma [name] questions were answered to her satisfaction today. Our contact information was provided should additional questions or concerns arise. Thank you for the referral and allowing [place] to share in the care of your patient.   Norma [name] P. Lowell Guitar, MS, CGC Licensed, Patent attorney [email or web address] phone: [phone]  30 minutes were spent on the date of the encounter in service to the patient including preparation, face-to-face consultation, documentation and care coordination.  The patient was seen alone.  Drs. Meliton Rattan, and/or [place] were available for questions, if needed..    _______________________________________________________________________ For Office Staff:  Number of people involved in session: 1 Was an Intern/ student involved with case: no

## 2023-06-12 NOTE — Progress Notes (Signed)
 [facility] Clinical Social Work  Initial Assessment   Norma [name] is a 53 y.o. year old female presenting alone. Clinical Social Work was referred by  [facility]  for assessment of psychosocial needs.   SDOH (Social Determinants of Health) assessments performed: Yes SDOH Interventions    Flowsheet Row Clinical Support from 06/12/2023 in [facility] - A Dept Of [place]. [facility] Office Visit from 03/26/2023 in [facility] [place] - A Dept Of Eligha Bridegroom. [facility] Office Visit from 10/18/2021 in [facility] [place] - A Dept Of Eligha Bridegroom. [facility]  SDOH Interventions     Food Insecurity Interventions Intervention Not Indicated -- --  Housing Interventions Intervention Not Indicated -- --  Transportation Interventions Intervention Not Indicated -- --  Utilities Interventions Intervention Not Indicated Intervention Not Indicated --  Depression Interventions/Treatment  -- Patient refuses Treatment Medication       SDOH Screenings   Food Insecurity: No Food Insecurity (06/12/2023)  Housing: Low Risk  (06/12/2023)  Transportation Needs: No Transportation Needs (06/12/2023)  Utilities: Not At Risk (06/12/2023)  Alcohol Screen: Low Risk  (08/30/2022)  Depression (PHQ2-9): High Risk (03/26/2023)  Financial Resource Strain: Low Risk  (08/30/2022)  Physical Activity: Unknown (08/30/2022)  Social Connections: Moderately Isolated (08/30/2022)  Stress: Stress Concern Present (08/30/2022)  Tobacco Use: Low Risk  (06/12/2023)  Health Literacy: Adequate Health Literacy (03/26/2023)     Distress Screen completed: No     No data to display            Family/Social Information:  Housing Arrangement: patient lives with her 27yo son who is a Consulting civil engineer Family members/support persons in your life? 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  Income source:  Employment Financial concerns: No Type of concern: None Food access concerns: no Advanced directives: No- discussed today Services Currently in place:  [employer]  Coping/ Adjustment to diagnosis: Patient understands treatment plan and what happens next? yes, she is nervous but trying to look at positives (treatable). She has limited support locally and is hopeful to just need surgery and radiation. She endorses a history of anxiety and depression with previous treatment with Wellbutrin and counseling, although not currently receiving any treatment. Concerns about diagnosis and/or treatment:  How she will feel and function Patient reported stressors: Depression, Anxiety/ nervousness, and Adjusting to my illness Hopes and/or priorities: focusing on prioritizing herself and seeing if her son can step up now that she is going to focus on her Current coping skills/ strengths: Ability for insight  and Motivation for treatment/growth     SUMMARY: Current SDOH Barriers:  Limited support History of anxiety and depression  Clinical Social Work Clinical Goal(s):  Patient will utilize support services available through [facility] as needed/desired through treatment  Interventions: Discussed common feeling and emotions when being diagnosed with cancer, and the importance of support during treatment Informed patient of the support team roles and support services at [facility] Provided CSW contact information and encouraged patient to call with any questions or concerns   Follow Up Plan: Patient will contact CSW with any support or resource needs Patient verbalizes understanding of plan: Yes    Romond Pipkins E Raya Mckinstry, LCSW Clinical Social Worker [facility]

## 2023-06-12 NOTE — Research (Signed)
 [employer] 2021-05 - Specimen Collection Study to Evaluate Biomarkers in Subjects with Cancer     Patient Norma [name] was identified by Dr. Al Pimple as a potential candidate for the above listed study.  This Clinical Research Coordinator met with [name], [id number], on 06/12/23 in a manner and location that ensures patient privacy to discuss participation in the above listed research study.  Patient is Unaccompanied.  A copy of the informed consent document with embedded HIPAA language was provided to the patient.  Patient reads, speaks, and understands [place].    Patient was provided with the business card of this Coordinator and encouraged to contact the research team with any questions.  Patient was provided the option of taking informed consent documents home to review and was encouraged to review at their convenience with their support network, including other care providers. Patient is comfortable with making a decision regarding study participation today.  As outlined in the informed consent form, this Coordinator and Melonie Florida discussed the purpose of the research study, the investigational nature of the study, study procedures and requirements for study participation, potential risks and benefits of study participation, as well as alternatives to participation. This study is not blinded. The patient understands participation is voluntary and they may withdraw from study participation at any time.  This study does not involve randomization.  This study does not involve an investigational drug or device. This study does not involve a placebo. Patient understands enrollment is pending full eligibility review.   Confidentiality and how the patient's information will be used as part of study participation were discussed.  Patient was informed there is not reimbursement provided for their time and effort spent on trial participation.  The patient is encouraged to discuss  research study participation with their insurance provider to determine what costs they may incur as part of study participation, including research related injury.    All questions were answered to patient's satisfaction.  The informed consent with embedded HIPAA language was reviewed page by page.  The patient's mental and emotional status is appropriate to provide informed consent, and the patient verbalizes an understanding of study participation.  Patient has agreed to participate in the above listed research study and has voluntarily signed the informed consent version 25 Mar 2020 Revised 10 Apr 2021 with embedded HIPAA language, version    25 Mar 2020 Revised 10 Apr 2021 on 06/12/23 at 11:30 AM.  The patient was provided with a copy of the signed informed consent form with embedded HIPAA language for their reference.  No study specific procedures were obtained prior to the signing of the informed consent document.  Approximately 20 minutes were spent with the patient reviewing the informed consent documents.  Patient was not requested to complete a Release of Information form.    Eligibility: Eligibility criteria reviewed with patient. This nurse/coordinator has reviewed this patient's inclusion and exclusion criteria and confirmed patient is eligible for study participation. Eligibility confirmed by treating investigator, who also agrees that patient should proceed with enrollment. Patient will continue with enrollment.  Data Collection: Patient was interviewed to collect the following information.   Medical History:  High Blood Pressure  Yes Coronary Artery Disease No Lupus    No Rheumatoid Arthritis  Yes Diabetes   Yes      If yes, which type?      Type 2 Lynch Syndrome  No  Is the patient currently taking a magnesium supplement?   No  Does the patient have a personal history of cancer (greater than 5 years ago)?  No  Does the patient have a family history of cancer in 1st or 2nd  degree relatives? No   Does the patient have history of alcohol consumption? Yes   If yes, current or former? current  Number of years? 33 Drinks per week? .1  Does the patient have history of cigarette, cigar, pipe, or chewing tobacco use?  No   Blood Collection: Research blood obtained by [facility] venipuncture or [facility] per patient's preference. Patient tolerated well without any adverse events.  Gift Card: $50 gift card given to patient for her participation in this study by Kelli Hope.  Patient was thanked for their participation in this study.    Cooper Render, MPH  Clinical Research Coordinator

## 2023-06-12 NOTE — Addendum Note (Signed)
 Encounter addended by: Erven Colla, PA-C on: 06/12/2023 9:57 AM  Actions taken: Clinical Note Signed

## 2023-06-13 ENCOUNTER — Telehealth: Payer: Self-pay | Admitting: *Deleted

## 2023-06-13 ENCOUNTER — Encounter: Payer: Self-pay | Admitting: *Deleted

## 2023-06-13 NOTE — Telephone Encounter (Signed)
 Spoke to pt concerning BMDC from 06/12/23. Denies questions or concerns regarding dx or treatment care plan. Encourage pt to call with needs. Received verbal understanding.

## 2023-06-18 ENCOUNTER — Telehealth: Payer: Self-pay | Admitting: Hematology and Oncology

## 2023-06-18 NOTE — Telephone Encounter (Signed)
 Spoke with patient confirming upcoming appointment

## 2023-06-21 ENCOUNTER — Telehealth: Payer: Self-pay | Admitting: Genetic Counselor

## 2023-06-21 ENCOUNTER — Encounter: Payer: Self-pay | Admitting: Genetic Counselor

## 2023-06-21 NOTE — Telephone Encounter (Signed)
 The number said 'it could not be completed as dialed and to consult the number and call again'.  I will send a MyChart message.

## 2023-06-23 ENCOUNTER — Encounter: Payer: Self-pay | Admitting: Genetic Counselor

## 2023-06-23 DIAGNOSIS — Z1379 Encounter for other screening for genetic and chromosomal anomalies: Secondary | ICD-10-CM | POA: Insufficient documentation

## 2023-06-24 ENCOUNTER — Encounter: Payer: Self-pay | Admitting: Genetic Counselor

## 2023-06-24 ENCOUNTER — Ambulatory Visit: Payer: Self-pay | Admitting: Genetic Counselor

## 2023-06-24 ENCOUNTER — Telehealth: Payer: Self-pay | Admitting: Genetic Counselor

## 2023-06-24 DIAGNOSIS — Z170 Estrogen receptor positive status [ER+]: Secondary | ICD-10-CM

## 2023-06-24 DIAGNOSIS — Z1379 Encounter for other screening for genetic and chromosomal anomalies: Secondary | ICD-10-CM

## 2023-06-24 NOTE — Telephone Encounter (Signed)
Revealed negative genetic testing.  Discussed that we do not know why she has breast cancer. It could be due to a different gene that we are not testing, or maybe our current technology may not be able to pick something up.  It will be important for her to keep in contact with genetics to keep up with whether additional testing may be needed.  

## 2023-06-24 NOTE — Progress Notes (Signed)
 HPI:  Ms. Beedy was previously seen in the [facility] due to a personal history of cancer and concerns regarding a hereditary predisposition to cancer. Please refer to our prior cancer genetics clinic note for more information regarding our discussion, assessment and recommendations, at the time. Ms. Escareno recent genetic test results were disclosed to her, as were recommendations warranted by these results. These results and recommendations are discussed in more detail below.  CANCER HISTORY:  Oncology History  Malignant neoplasm of upper-outer quadrant of left breast in female, estrogen receptor positive (HCC)  04/25/2023 Mammogram   Mass in the left breast central to nipple anterior depth. Additional imaging showed 1 * 0.8*0.7 cm irregular enlarging mass in the left breast at 3 0 clock anterior depth suspicious of malignancy. US guided biopsy is recommended.   06/04/2023 Pathology Results   Left breast needle core biopsy showed grade 3 IDC, ER 100% positive, PR 70% strong staining,  Her 2 0, Ki 67 30%   06/11/2023 Initial Diagnosis   Malignant neoplasm of upper-outer quadrant of left breast in female, estrogen receptor positive (HCC)   06/21/2023 Genetic Testing   Negative genetic testing on the CancerNext-Expanded+RNAinsight panel.  The report date is June 21, 2023.  The CancerNext-Expanded gene panel offered by [facility] and includes sequencing, rearrangement, and RNA analysis for the following 76 genes: AIP, ALK, APC, ATM, AXIN2, BAP1, BARD1, BMPR1A, BRCA1, BRCA2, BRIP1, CDC73, CDH1, CDK4, CDKN1B, CDKN2A, CEBPA, CHEK2, CTNNA1, DDX41, DICER1, ETV6, FH, FLCN, GATA2, LZTR1, MAX, MBD4, MEN1, MET, MLH1, MSH2, MSH3, MSH6, MUTYH, NF1, NF2, NTHL1, PALB2, PHOX2B, PMS2, POT1, PRKAR1A, PTCH1, PTEN, RAD51C, RAD51D, RB1, RET, RUNX1, SDHA, SDHAF2, SDHB, SDHC, SDHD, SMAD4, SMARCA4, SMARCB1, SMARCE1, STK11, SUFU, TMEM127, TP53, TSC1, TSC2, VHL, and WT1 (sequencing and  deletion/duplication); EGFR, HOXB13, KIT, MITF, PDGFRA, POLD1, and POLE (sequencing only); EPCAM and GREM1 (deletion/duplication only).       FAMILY HISTORY:  We obtained a detailed, 4-generation family history.  Significant diagnoses are listed below: Family History  Problem Relation Age of Onset   Diabetes Mother    Aneurysm Mother    Diabetes Father    Colon polyps Maternal Uncle    Liver disease Maternal Grandmother    Rheum arthritis Maternal Grandmother    Stroke Maternal Grandfather    Parkinson's disease Paternal Grandmother    Esophageal cancer Neg Hx    Colon cancer Neg Hx    Rectal cancer Neg Hx    Stomach cancer Neg Hx        The patient has one son who is cancer free.  She has two paternal half sisters who are cancer free.  Her mother is deceased and her father is living.   The patient does not have information on the paternal side of the family.   The patient's mother died at 56 from a cerebral hemorrhage.  She had multiple siblings who were not reported to have cancer.   Ms. Demma is unaware of previous family history of genetic testing for hereditary cancer risks. There is no reported Ashkenazi Jewish ancestry. There is no known consanguinity.  GENETIC TEST RESULTS: Genetic testing reported out on April 11 and 13, 2025 through the [facility] and CancerNext-Expanded+RNAinsight cancer panels found no pathogenic mutations. The CancerNext-Expanded gene panel offered by [facility] and includes sequencing, rearrangement, and RNA analysis for the following 76 genes: AIP, ALK, APC, ATM, AXIN2, BAP1, BARD1, BMPR1A, BRCA1, BRCA2, BRIP1, CDC73, CDH1, CDK4, CDKN1B, CDKN2A, CEBPA, CHEK2, CTNNA1, DDX41, DICER1, ETV6,  FH, FLCN, GATA2, LZTR1, MAX, MBD4, MEN1, MET, MLH1, MSH2, MSH3, MSH6, MUTYH, NF1, NF2, NTHL1, PALB2, PHOX2B, PMS2, POT1, PRKAR1A, PTCH1, PTEN, RAD51C, RAD51D, RB1, RET, RUNX1, SDHA, SDHAF2, SDHB, SDHC, SDHD, SMAD4, SMARCA4, SMARCB1, SMARCE1, STK11, SUFU, TMEM127,  TP53, TSC1, TSC2, VHL, and WT1 (sequencing and deletion/duplication); EGFR, HOXB13, KIT, MITF, PDGFRA, POLD1, and POLE (sequencing only); EPCAM and GREM1 (deletion/duplication only). The test report has been scanned into EPIC and is located under the Molecular Pathology section of the Results Review tab.  A portion of the result report is included below for reference.     We discussed with Ms. Leland that because current genetic testing is not perfect, it is possible there may be a gene mutation in one of these genes that current testing cannot detect, but that chance is small.  We also discussed, that there could be another gene that has not yet been discovered, or that we have not yet tested, that is responsible for the cancer diagnoses in the family. It is also possible there is a hereditary cause for the cancer in the family that Ms. Will did not inherit and therefore was not identified in her testing.  Therefore, it is important to remain in touch with cancer genetics in the future so that we can continue to offer Ms. Hankins the most up to date genetic testing.   ADDITIONAL GENETIC TESTING: We discussed with Ms. Palazzola that her genetic testing was fairly extensive.  If there are genes identified to increase cancer risk that can be analyzed in the future, we would be happy to discuss and coordinate this testing at that time.    CANCER SCREENING RECOMMENDATIONS: Ms. Hovanec test result is considered negative (normal).  This means that we have not identified a hereditary cause for her personal history of cancer at this time. Most cancers happen by chance and this negative test suggests that her personal history of breast cancer may fall into this category.    Possible reasons for Ms. Landgren's negative genetic test include:  1. There may be a gene mutation in one of these genes that current testing methods cannot detect but that chance is small.  2. There could be another gene that has not yet been  discovered, or that we have not yet tested, that is responsible for the cancer diagnoses in the family.  3.  There may be no hereditary risk for cancer in the family. The cancers in Ms. Evola and/or her family may be sporadic/familial or due to other genetic and environmental factors. 4. It is also possible there is a hereditary cause for the cancer in the family that Ms. Mcevers did not inherit.  Therefore, it is recommended she continue to follow the cancer management and screening guidelines provided by her oncology and primary healthcare provider. An individual's cancer risk and medical management are not determined by genetic test results alone. Overall cancer risk assessment incorporates additional factors, including personal medical history, family history, and any available genetic information that may result in a personalized plan for cancer prevention and surveillance  RECOMMENDATIONS FOR FAMILY MEMBERS:   Since she did not inherit a identifiable mutation in a cancer predisposition gene included on this panel, her children could not have inherited a known mutation from her in one of these genes. Individuals in this family might be at some increased risk of developing cancer, over the general population risk, simply due to the family history of cancer.  We recommended women in this family have a  yearly mammogram beginning at age 103, or 65 years younger than the earliest onset of cancer, an annual clinical breast exam, and perform monthly breast self-exams. Women in this family should also have a gynecological exam as recommended by their primary provider. All family members should be referred for colonoscopy starting at age 27, or 20 years younger than the earliest onset of cancer.  FOLLOW-UP: Lastly, we discussed with Ms. Stringfield that cancer genetics is a rapidly advancing field and it is possible that new genetic tests will be appropriate for her and/or her family members in the future. We  encouraged her to remain in contact with cancer genetics on an annual basis so we can update her personal and family histories and let her know of advances in cancer genetics that may benefit this family.   Our contact number was provided. Ms. Schoonmaker questions were answered to her satisfaction, and she knows she is welcome to call us  at anytime with additional questions or concerns.   Marijo Shove, MS, [facility] Licensed, Certified Genetic Counselor [email or web address]

## 2023-06-25 ENCOUNTER — Other Ambulatory Visit: Payer: Self-pay

## 2023-06-25 ENCOUNTER — Encounter (HOSPITAL_BASED_OUTPATIENT_CLINIC_OR_DEPARTMENT_OTHER): Payer: Self-pay | Admitting: General Surgery

## 2023-06-27 ENCOUNTER — Encounter (HOSPITAL_BASED_OUTPATIENT_CLINIC_OR_DEPARTMENT_OTHER)
Admission: RE | Admit: 2023-06-27 | Discharge: 2023-06-27 | Disposition: A | Discharge status: Home / Self Care | Source: Ambulatory Visit | Attending: General Surgery | Admitting: General Surgery

## 2023-06-27 DIAGNOSIS — Z01810 Encounter for preprocedural cardiovascular examination: Secondary | ICD-10-CM | POA: Insufficient documentation

## 2023-06-27 DIAGNOSIS — E119 Type 2 diabetes mellitus without complications: Secondary | ICD-10-CM | POA: Insufficient documentation

## 2023-06-27 DIAGNOSIS — I10 Essential (primary) hypertension: Secondary | ICD-10-CM | POA: Insufficient documentation

## 2023-06-27 MED ORDER — CHLORHEXIDINE GLUCONATE CLOTH 2 % EX PADS: 6.0000 | MEDICATED_PAD | Freq: Once | CUTANEOUS | Status: DC

## 2023-06-27 NOTE — Progress Notes (Signed)

## 2023-07-01 NOTE — H&P (Signed)
 REFERRING PHYSICIAN:  Zoila Hines.    PROVIDER:  Eppie Hasting, MD   Care Team: Patient Care Team: Winda Hastings, NP as PCP - General (Family Medicine) Eppie Hasting, MD as Consulting Provider (Surgical Oncology) Iruku, Charlotta Cook, MD (Hematology and Oncology) Aaron Aas, MD (Radiation Oncology) Jeanie Miller, MD (Radiology)    MRN: [id number] DOB: [birth year] DATE OF ENCOUNTER: 06/12/2023   Subjective    Chief Complaint: Breast Cancer   History of Present Illness: Norma [name] is a 53 y.o. female who is seen today as an office consultation at the request of Dr. Arno Bibles for evaluation of Breast Cancer .     Patient has a new history of left breast cancer March 2025.  The patient presented with a screening detected left breast mass.  Diagnostic imaging was performed.  This showed a 9 to 10 mm mass at 3:00 on the left breast as well as an axillary lymph node with cortical thickening.  A core needle biopsy was performed.  This showed a grade 3 invasive ductal carcinoma.  Prognostic panel was ER and PR positive, HER2 negative, Ki-67 was 30%.  Breast density was A.  Lymph node biopsy was negative.    Of note, this lesion was noted in 2023.  Biopsy was recommended at that time.  This was at breast center [facility].  Patient was dissatisfied with the process and declined biopsy then.  She switched to [facility] and was much more pleased with that interaction.  She did opt for biopsy this time.   Family cancer history - none Menarche - 9 Menopause 36 with hysterectomy. Patient is unsure if ovaries were removed.  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 Breast, left, needle core biopsy, 3:00 2cmfn :       INVASIVE DUCTAL CARCINOMA       TUBULE FORMATION: SCORE 2       NUCLEAR  PLEOMORPHISM: SCORE 3       MITOTIC COUNT: SCORE 3       TOTAL SCORE: 8       OVERALL GRADE: 3       LYMPHOVASCULAR INVASION: [NOT IDENTIFIED]       CANCER LENGTH: 0.4 CM       CALCIFICATIONS: NOT IDENTIFIED       OTHER FINDINGS: FIBROCYSTIC CHANGES       SEE COMMENT       2. Lymph node, biopsy, Left axillary node :       -  NO CARCINOMA IDENTIFIED IN NODAL TISSUE    Receptors: The tumor cells are NEGATIVE for Her2 (0).  Estrogen Receptor:  100%, POSITIVE, STRONG STAINING INTENSITY  Progesterone Receptor:  70%, POSITIVE, MODERATE-STRONG STAINING INTENSITY  Proliferation Marker Ki67:  30%      Review of Systems: A complete review of systems was obtained from the patient.  I have reviewed this information and discussed as appropriate with the patient.  See HPI as well for other ROS. ROS -positive for night sweats and sleep disturbance.  Positive for fatigue.  Positive for some shortness of breath with stairs.  Patient endorses heartburn.  Patient says she has back pain with including her other joint pain.  Headaches anxiety are also present.       Medical History: Past Medical History  Past Medical History:  Diagnosis Date   Anemia     Anxiety     Arthritis     Asthma, unspecified asthma severity, unspecified whether complicated, unspecified whether persistent (HHS-HCC)     Diabetes mellitus without complication (CMS/HHS-HCC)     GERD (gastroesophageal reflux disease)     Hyperlipidemia     Hypertension          Problem List     Patient Active Problem List  Diagnosis   Malignant neoplasm of upper-outer quadrant of left breast in female, estrogen receptor positive (CMS/HHS-HCC)   Anemia   Anxiety and depression   Benign neoplasm of ascending colon   Chronic cough   Chronic RLQ pain   LUQ abdominal pain   RUQ abdominal pain   Esophageal dysphagia   Gastritis without bleeding   Class 3 severe obesity with serious comorbidity and body mass index (BMI) of 40.0 to  44.9 in adult (CMS/HHS-HCC)   Gastroesophageal reflux disease without esophagitis   Generalized osteoarthritis   Genital herpes simplex   HTN (hypertension)   Hx of adenomatous colonic polyps   IBS (irritable bowel syndrome)   Type 2 diabetes mellitus without complication, without long-term current use of insulin (CMS/HHS-HCC)   Seasonal depression ()   Paresthesia   Mixed hyperlipidemia   Schatzki's ring   Migraine   Lumbar disc herniation        Past Surgical History       Past Surgical History:  Procedure Laterality Date   LUMBAR LAMINECTOMY/DECOMPRESSION MICRODISCECTOMY 1 LEVEL,RIGHT LUMBAR FOUR-FIVE Right 07/17/2013   COLONOSCOPY WITH PROPOFOL  N/A 06/01/2021   ESOPHAGOGASTRODUODENOSCOPY (EGD) WITH PROPOFOL  N/A 06/01/2021   POLYPECTOMY N/A 06/01/2021   .partial hysterectomy N/A      Date unknown        Allergies       Allergies  Allergen Reactions   Empagliflozin Other (See Comments)      Flank pain    Flank pain   Metformin Other (See Comments) and Nausea   Semaglutide Other (See Comments)      Headache   Tizanidine Other (See Comments)      Causes nightmares        Medications Ordered Prior to Encounter        Current Outpatient Medications on File Prior to Visit  Medication Sig Dispense Refill   aspirin 81 MG chewable tablet Take 1 tablet by mouth once daily       azelastine -fluticasone  137-50 mcg/spray Spry Place 1 spray into one nostril every 12 (twelve) hours       celecoxib  (CELEBREX ) 200 MG capsule Take 200 mg by mouth 2 (two) times daily  Indications: joint damage causing pain and loss of function       cetirizine  (ZYRTEC ) 10 MG tablet Take 1 tablet by mouth once daily       estradioL (ESTRACE) 0.5 MG tablet Take 1 tablet by mouth once daily       ezetimibe (ZETIA) 10 mg tablet Take 1 tablet by mouth once daily       fezolinetant (VEOZAH) 45 mg Tab Take 1 tablet by mouth once daily       fluticasone  propionate (FLONASE ) 50 mcg/actuation nasal  spray Place 2 sprays into one nostril 2 (two) times daily       glimepiride  (AMARYL ) 1 MG tablet Take 1 mg by mouth daily with breakfast MUST HAVE OFFICE VISIT FOR REFILLS       glipiZIDE  (GLUCOTROL  XL) 10 MG XL tablet  Take 1 tablet by mouth once daily       hydroquinone 4 % cream Apply 1 Application  topically 2 (two) times daily       levocetirizine (XYZAL ) 2.5 mg/5 mL solution Take 2.5 mg by mouth every evening       pantoprazole  (PROTONIX ) 20 MG DR tablet Take 1 tablet by mouth 2 (two) times daily       phentermine (ADIPEX-P) 15 MG capsule Take 15 mg by mouth once daily       telmisartan  (MICARDIS ) 80 MG tablet Take 80 mg by mouth once daily  MUST HAVE OFFICE VISIT FOR REFILLS        No current facility-administered medications on file prior to visit.        Family History       Family History  Problem Relation Age of Onset   Diabetes Mother     Diabetes Father          Tobacco Use History  Social History       Tobacco Use  Smoking Status Unknown  Smokeless Tobacco Not on file        Social History  Social History        Socioeconomic History   Marital status: Single  Tobacco Use   Smoking status: Unknown  Vaping Use   Vaping status: Unknown  Substance and Sexual Activity   Alcohol use: Defer   Drug use: Defer    Social Drivers of Health        Financial Resource Strain: Low Risk  (08/30/2022)    Received from [facility]    Overall Financial Resource Strain (CARDIA)     Difficulty of Paying Living Expenses: Not hard at all  Food Insecurity: No Food Insecurity (08/30/2022)    Received from [facility]    Hunger Vital Sign     Worried About Running Out of Food in the Last Year: Never true     Ran Out of Food in the Last Year: Never true  Transportation Needs: No Transportation Needs (08/30/2022)    Received from [facility] - Transportation     Lack of Transportation (Medical): No     Lack of Transportation (Non-Medical): No  Physical  Activity: Unknown (08/30/2022)    Received from [facility]    Exercise Vital Sign     Days of Exercise per Week: 0 days  Stress: Stress Concern Present (08/30/2022)    Received from [facility] - Occupational Stress Questionnaire     Feeling of Stress : Very much  Social Connections: Moderately Isolated (08/30/2022)    Received from [facility]    Social Connection and Isolation Panel [NHANES]     Frequency of Communication with Friends and Family: More than three times a week     Frequency of Social Gatherings with Friends and Family: Once a week     Attends Religious Services: 1 to 4 times per year     Active Member of Clubs or Organizations: No     Marital Status: Never married  Housing Stability: Low Risk  (05/04/2021)    Received from [facility] visits prior to 05/12/2022.    Housing Stability Vital Sign     Unable to Pay for Housing in the Last Year: No     Number of Places Lived in the Last Year: 1     In the last 12 months,  was there a time when you did not have a steady place to sleep or slept in a shelter (including now)?: No        Objective:         Vitals:    06/12/23 1204  BP: (!) 154/81  Pulse: 101  Resp: 16  Temp: 36.5 C (97.7 F)  Weight: (!) 117.5 kg (259 lb)  Height: 168.9 cm (5' 6.5")    Body mass index is 41.18 kg/m.   Gen:  No acute distress.  Well nourished and well groomed.   Neurological: Alert and oriented to person, place, and time. Coordination normal.  Head: Normocephalic and atraumatic.  Eyes: Conjunctivae are normal. Pupils are equal, round, and reactive to light. No scleral icterus.  Neck: Normal range of motion. Neck supple. No tracheal deviation or thyromegaly present.  Cardiovascular: Normal rate, regular rhythm, normal heart sounds and intact distal pulses.  Exam reveals no gallop and no friction rub.  No murmur heard. Breast: Breasts are relatively symmetric.  Left breast  shows some skin changes at the biopsy site.  There is no lymphadenopathy, no nipple retraction or nipple discharge, no palpable mass, no skin dimpling, no contour abnormalities.  Right breast is also benign.  Small fatty mass left chest wall. Respiratory: Effort normal.  No respiratory distress. No chest wall tenderness. Breath sounds normal.  No wheezes, rales or rhonchi.  GI: Soft. Bowel sounds are normal. The abdomen is soft and nontender.  There is no rebound and no guarding.  Musculoskeletal: Normal range of motion. Extremities are nontender.  Lymphadenopathy: No cervical, preauricular, postauricular or axillary adenopathy is present Skin: Skin is warm and dry. No rash noted. No diaphoresis. No erythema. No pallor. No clubbing, cyanosis, or edema.   Psychiatric: Normal mood and affect. Behavior is normal. Judgment and thought content normal.      Labs Labs from today are not yet available. 04/04/23 CBC and complete metabolic panel were essentially normal   Assessment and Plan:        ICD-10-CM    1. Malignant neoplasm of upper-outer quadrant of left breast in female, estrogen receptor positive (CMS/HHS-HCC)  C50.412      Z17.0         Patient has a new diagnosis of clinical T1b N0 left breast cancer.  This is amenable to breast conservation.  Lumpectomy and sentinel lymph node biopsy would be followed by recommendation for radiation and antihormonal treatment.  Patient likely would also have an Oncotype sent to determine recommendation for any chemotherapy.  Patient is offered genetic testing.    I discussed breast conserving surgery with the patient.  I reviewed seed placement with the patient and will schedule left breast seed localized lumpectomy with sentinel lymph node mapping and biopsy.  I reviewed surgical procedure, preoperative and postoperative expectations, recovery, and risks.  Discussed risk of bleeding, infection, chronic pain, dissatisfaction with appearance, possible need  for additional surgery, possible recurrent cancer, possible heart or lung complications, possible blood clot.  Patient wishes to proceed.   We will do this at the first available opportunity.   Patient's imaging, pathology, and treatment plan were discussed in multidisciplinary conference today.

## 2023-07-02 ENCOUNTER — Other Ambulatory Visit: Payer: Self-pay

## 2023-07-02 ENCOUNTER — Encounter (HOSPITAL_BASED_OUTPATIENT_CLINIC_OR_DEPARTMENT_OTHER)
Admission: RE | Disposition: A | Discharge status: Home / Self Care | Payer: Self-pay | Source: Home / Self Care | Attending: General Surgery

## 2023-07-02 ENCOUNTER — Encounter (HOSPITAL_BASED_OUTPATIENT_CLINIC_OR_DEPARTMENT_OTHER): Payer: Self-pay | Admitting: General Surgery

## 2023-07-02 ENCOUNTER — Ambulatory Visit (HOSPITAL_BASED_OUTPATIENT_CLINIC_OR_DEPARTMENT_OTHER): Admitting: Anesthesiology

## 2023-07-02 ENCOUNTER — Ambulatory Visit (HOSPITAL_BASED_OUTPATIENT_CLINIC_OR_DEPARTMENT_OTHER)
Admission: RE | Admit: 2023-07-02 | Discharge: 2023-07-02 | Disposition: A | Discharge status: Home / Self Care | Attending: General Surgery | Admitting: General Surgery

## 2023-07-02 DIAGNOSIS — J45909 Unspecified asthma, uncomplicated: Secondary | ICD-10-CM

## 2023-07-02 DIAGNOSIS — Z79899 Other long term (current) drug therapy: Secondary | ICD-10-CM | POA: Insufficient documentation

## 2023-07-02 DIAGNOSIS — Z7984 Long term (current) use of oral hypoglycemic drugs: Secondary | ICD-10-CM | POA: Diagnosis not present

## 2023-07-02 DIAGNOSIS — Z1721 Progesterone receptor positive status: Secondary | ICD-10-CM | POA: Diagnosis not present

## 2023-07-02 DIAGNOSIS — K219 Gastro-esophageal reflux disease without esophagitis: Secondary | ICD-10-CM | POA: Insufficient documentation

## 2023-07-02 DIAGNOSIS — M797 Fibromyalgia: Secondary | ICD-10-CM | POA: Insufficient documentation

## 2023-07-02 DIAGNOSIS — Z1732 Human epidermal growth factor receptor 2 negative status: Secondary | ICD-10-CM | POA: Insufficient documentation

## 2023-07-02 DIAGNOSIS — E119 Type 2 diabetes mellitus without complications: Secondary | ICD-10-CM | POA: Diagnosis not present

## 2023-07-02 DIAGNOSIS — Z170 Estrogen receptor positive status [ER+]: Secondary | ICD-10-CM | POA: Diagnosis present

## 2023-07-02 DIAGNOSIS — C50412 Malignant neoplasm of upper-outer quadrant of left female breast: Secondary | ICD-10-CM | POA: Insufficient documentation

## 2023-07-02 DIAGNOSIS — I10 Essential (primary) hypertension: Secondary | ICD-10-CM

## 2023-07-02 HISTORY — PX: BREAST LUMPECTOMY WITH RADIOACTIVE SEED AND SENTINEL LYMPH NODE BIOPSY: SHX6550

## 2023-07-02 HISTORY — DX: Other complications of anesthesia, initial encounter: T88.59XA

## 2023-07-02 HISTORY — PX: MASS EXCISION: SHX2000

## 2023-07-02 HISTORY — DX: Other specified postprocedural states: Z98.890

## 2023-07-02 LAB — GLUCOSE, CAPILLARY
Glucose-Capillary: 118 mg/dL — ABNORMAL HIGH (ref 70–99)
Glucose-Capillary: 135 mg/dL — ABNORMAL HIGH (ref 70–99)

## 2023-07-02 SURGERY — BREAST LUMPECTOMY WITH RADIOACTIVE SEED AND SENTINEL LYMPH NODE BIOPSY
Anesthesia: General | Site: Chest | Laterality: Left

## 2023-07-02 MED ORDER — DROPERIDOL 2.5 MG/ML IJ SOLN: 0.6250 mg | Freq: Once | INTRAMUSCULAR | Status: DC | PRN

## 2023-07-02 MED ORDER — FENTANYL CITRATE (PF) 100 MCG/2ML IJ SOLN: 25.0000 ug | INTRAMUSCULAR | Status: DC | PRN

## 2023-07-02 MED ORDER — MIDAZOLAM HCL 2 MG/2ML IJ SOLN
INTRAMUSCULAR | Status: AC
  Filled 2023-07-02: qty 2

## 2023-07-02 MED ORDER — DEXAMETHASONE SODIUM PHOSPHATE 10 MG/ML IJ SOLN
INTRAMUSCULAR | Status: DC | PRN
  Administered 2023-07-02: 5 mg via INTRAVENOUS

## 2023-07-02 MED ORDER — CEFAZOLIN SODIUM-DEXTROSE 2-4 GM/100ML-% IV SOLN: 2.0000 g | INTRAVENOUS | Status: DC

## 2023-07-02 MED ORDER — CEFAZOLIN SODIUM-DEXTROSE 2-3 GM-%(50ML) IV SOLR
INTRAVENOUS | Status: DC | PRN
  Administered 2023-07-02: 2 g via INTRAVENOUS

## 2023-07-02 MED ORDER — ONDANSETRON HCL 4 MG/2ML IJ SOLN
INTRAMUSCULAR | Status: DC | PRN
  Administered 2023-07-02: 4 mg via INTRAVENOUS

## 2023-07-02 MED ORDER — FENTANYL CITRATE (PF) 100 MCG/2ML IJ SOLN
INTRAMUSCULAR | Status: AC
  Filled 2023-07-02: qty 2

## 2023-07-02 MED ORDER — LACTATED RINGERS IV SOLN: INTRAVENOUS | Status: DC | PRN

## 2023-07-02 MED ORDER — ACETAMINOPHEN 500 MG PO TABS
ORAL_TABLET | ORAL | Status: AC
  Filled 2023-07-02: qty 2

## 2023-07-02 MED ORDER — FENTANYL CITRATE (PF) 100 MCG/2ML IJ SOLN
50.0000 ug | Freq: Once | INTRAMUSCULAR | Status: AC
  Administered 2023-07-02: 50 ug via INTRAVENOUS

## 2023-07-02 MED ORDER — OXYCODONE HCL 5 MG PO TABS
ORAL_TABLET | ORAL | Status: AC
  Filled 2023-07-02: qty 1

## 2023-07-02 MED ORDER — MAGTRACE LYMPHATIC TRACER
INTRAMUSCULAR | Status: DC | PRN
  Administered 2023-07-02: 2 mL via INTRAMUSCULAR

## 2023-07-02 MED ORDER — PROPOFOL 500 MG/50ML IV EMUL
INTRAVENOUS | Status: DC | PRN
  Administered 2023-07-02: 50 ug/kg/min via INTRAVENOUS

## 2023-07-02 MED ORDER — OXYCODONE HCL 5 MG PO TABS
5.0000 mg | ORAL_TABLET | Freq: Four times a day (QID) | ORAL | 0 refills | Status: DC | PRN
Start: 2023-07-02 — End: 2023-07-31

## 2023-07-02 MED ORDER — OXYCODONE HCL 5 MG/5ML PO SOLN: 5.0000 mg | Freq: Once | ORAL | Status: DC | PRN

## 2023-07-02 MED ORDER — CEFAZOLIN SODIUM-DEXTROSE 2-4 GM/100ML-% IV SOLN
INTRAVENOUS | Status: AC
  Filled 2023-07-02: qty 100

## 2023-07-02 MED ORDER — 0.9 % SODIUM CHLORIDE (POUR BTL) OPTIME
TOPICAL | Status: DC | PRN
Start: 2023-07-02 — End: 2023-07-02
  Administered 2023-07-02: 1000 mL

## 2023-07-02 MED ORDER — OXYCODONE HCL 5 MG PO TABS: 5.0000 mg | ORAL_TABLET | Freq: Once | ORAL | Status: DC | PRN

## 2023-07-02 MED ORDER — DEXAMETHASONE SODIUM PHOSPHATE 10 MG/ML IJ SOLN
INTRAMUSCULAR | Status: AC
  Filled 2023-07-02: qty 1

## 2023-07-02 MED ORDER — LIDOCAINE 2% (20 MG/ML) 5 ML SYRINGE
INTRAMUSCULAR | Status: AC
  Filled 2023-07-02: qty 5

## 2023-07-02 MED ORDER — LIDOCAINE HCL 1 % IJ SOLN
INTRAMUSCULAR | Status: DC | PRN
  Administered 2023-07-02: 15 mL

## 2023-07-02 MED ORDER — PROPOFOL 10 MG/ML IV BOLUS
INTRAVENOUS | Status: DC | PRN
  Administered 2023-07-02: 200 mg via INTRAVENOUS

## 2023-07-02 MED ORDER — FENTANYL CITRATE (PF) 100 MCG/2ML IJ SOLN
INTRAMUSCULAR | Status: DC | PRN
  Administered 2023-07-02: 50 ug via INTRAVENOUS
  Administered 2023-07-02 (×2): 25 ug via INTRAVENOUS

## 2023-07-02 MED ORDER — LACTATED RINGERS IV SOLN: INTRAVENOUS | Status: DC

## 2023-07-02 MED ORDER — ACETAMINOPHEN 500 MG PO TABS
1000.0000 mg | ORAL_TABLET | ORAL | Status: AC
  Administered 2023-07-02: 1000 mg via ORAL

## 2023-07-02 MED ORDER — ONDANSETRON HCL 4 MG/2ML IJ SOLN
INTRAMUSCULAR | Status: AC
  Filled 2023-07-02: qty 2

## 2023-07-02 MED ORDER — LIDOCAINE HCL (CARDIAC) PF 100 MG/5ML IV SOSY
PREFILLED_SYRINGE | INTRAVENOUS | Status: DC | PRN
  Administered 2023-07-02: 100 mg via INTRAVENOUS

## 2023-07-02 SURGICAL SUPPLY — 57 items
BINDER BREAST LRG (GAUZE/BANDAGES/DRESSINGS) IMPLANT
BINDER BREAST MEDIUM (GAUZE/BANDAGES/DRESSINGS) IMPLANT
BINDER BREAST XLRG (GAUZE/BANDAGES/DRESSINGS) IMPLANT
BINDER BREAST XXLRG (GAUZE/BANDAGES/DRESSINGS) IMPLANT
BLADE SURG 10 STRL SS (BLADE) ×2 IMPLANT
BLADE SURG 15 STRL LF DISP TIS (BLADE) ×2 IMPLANT
BNDG COHESIVE 4X5 TAN STRL LF (GAUZE/BANDAGES/DRESSINGS) ×2 IMPLANT
CANISTER SUC SOCK COL 7IN (MISCELLANEOUS) IMPLANT
CANISTER SUCT 1200ML W/VALVE (MISCELLANEOUS) ×2 IMPLANT
CHLORAPREP W/TINT 26 (MISCELLANEOUS) ×2 IMPLANT
CLIP TI LARGE 6 (CLIP) ×2 IMPLANT
CLIP TI MEDIUM 24 (CLIP) IMPLANT
CLIP TI MEDIUM 6 (CLIP) ×4 IMPLANT
CLIP TI WIDE RED SMALL 6 (CLIP) IMPLANT
COVER MAYO STAND STRL (DRAPES) ×4 IMPLANT
COVER PROBE CYLINDRICAL 5X96 (MISCELLANEOUS) ×2 IMPLANT
DERMABOND ADVANCED .7 DNX12 (GAUZE/BANDAGES/DRESSINGS) ×2 IMPLANT
DRAPE SURG 17X23 STRL (DRAPES) IMPLANT
DRAPE UTILITY XL STRL (DRAPES) ×2 IMPLANT
ELECT COATED BLADE 2.86 ST (ELECTRODE) ×2 IMPLANT
ELECTRODE BLDE 4.0 EZ CLN MEGD (MISCELLANEOUS) IMPLANT
ELECTRODE REM PT RTRN 9FT ADLT (ELECTROSURGICAL) ×2 IMPLANT
GAUZE PAD ABD 8X10 STRL (GAUZE/BANDAGES/DRESSINGS) ×2 IMPLANT
GAUZE SPONGE 4X4 12PLY STRL LF (GAUZE/BANDAGES/DRESSINGS) ×2 IMPLANT
GLOVE BIO SURGEON STRL SZ 6 (GLOVE) ×2 IMPLANT
GLOVE BIOGEL PI IND STRL 6.5 (GLOVE) ×2 IMPLANT
GOWN STRL REUS W/ TWL LRG LVL3 (GOWN DISPOSABLE) ×2 IMPLANT
GOWN STRL REUS W/ TWL XL LVL3 (GOWN DISPOSABLE) ×2 IMPLANT
KIT MARKER MARGIN INK (KITS) ×2 IMPLANT
LIGHT WAVEGUIDE WIDE FLAT (MISCELLANEOUS) IMPLANT
NDL HYPO 25X1 1.5 SAFETY (NEEDLE) ×2 IMPLANT
NDL SAFETY ECLIPSE 18X1.5 (NEEDLE) ×2 IMPLANT
NEEDLE HYPO 25X1 1.5 SAFETY (NEEDLE) ×2 IMPLANT
NS IRRIG 1000ML POUR BTL (IV SOLUTION) ×2 IMPLANT
PACK BASIN DAY SURGERY FS (CUSTOM PROCEDURE TRAY) ×2 IMPLANT
PACK UNIVERSAL I (CUSTOM PROCEDURE TRAY) ×2 IMPLANT
PENCIL SMOKE EVACUATOR (MISCELLANEOUS) ×2 IMPLANT
SLEEVE SCD COMPRESS KNEE MED (STOCKING) ×2 IMPLANT
SPIKE FLUID TRANSFER (MISCELLANEOUS) IMPLANT
SPONGE T-LAP 18X18 ~~LOC~~+RFID (SPONGE) ×4 IMPLANT
STAPLER SKIN PROX WIDE 3.9 (STAPLE) IMPLANT
STOCKINETTE IMPERVIOUS LG (DRAPES) ×2 IMPLANT
STRIP CLOSURE SKIN 1/2X4 (GAUZE/BANDAGES/DRESSINGS) ×2 IMPLANT
SUT ETHILON 2 0 FS 18 (SUTURE) IMPLANT
SUT MNCRL AB 4-0 PS2 18 (SUTURE) ×2 IMPLANT
SUT MON AB 5-0 PS2 18 (SUTURE) IMPLANT
SUT SILK 2 0 SH (SUTURE) IMPLANT
SUT VIC AB 2-0 SH 27XBRD (SUTURE) ×2 IMPLANT
SUT VIC AB 3-0 SH 27X BRD (SUTURE) ×2 IMPLANT
SUT VICRYL 3-0 CR8 SH (SUTURE) ×2 IMPLANT
SYR BULB EAR ULCER 3OZ GRN STR (SYRINGE) ×2 IMPLANT
SYR CONTROL 10ML LL (SYRINGE) ×2 IMPLANT
TOWEL GREEN STERILE FF (TOWEL DISPOSABLE) ×2 IMPLANT
TRACER MAGTRACE VIAL (MISCELLANEOUS) IMPLANT
TRAY FAXITRON CT DISP (TRAY / TRAY PROCEDURE) ×2 IMPLANT
TUBE CONNECTING 20X1/4 (TUBING) ×2 IMPLANT
YANKAUER SUCT BULB TIP NO VENT (SUCTIONS) ×2 IMPLANT

## 2023-07-02 NOTE — Interval H&P Note (Signed)
 History and Physical Interval Note:  07/02/2023 11:51 AM  Norma [name]  has presented today for surgery, with the diagnosis of LEFT BREAST CANCER.  The various methods of treatment have been discussed with the patient and family. After consideration of risks, benefits and other options for treatment, the patient has consented to  Procedure(s) with comments: BREAST LUMPECTOMY WITH RADIOACTIVE SEED AND SENTINEL LYMPH NODE BIOPSY (Left), EXCISION LEFT CHEST WALL MASS as a surgical intervention.  The patient's history has been reviewed, patient examined, no change in status, stable for surgery.  I have reviewed the patient's chart and labs.  Questions were answered to the patient's satisfaction.     Lockie Rima

## 2023-07-02 NOTE — Anesthesia Preprocedure Evaluation (Addendum)
 Anesthesia Evaluation  Patient identified by MRN, date of birth, ID band Patient awake    Reviewed: Allergy & Precautions, NPO status , Patient's Chart, lab work & pertinent test results  History of Anesthesia Complications (+) PONV and history of anesthetic complications  Airway Mallampati: III  TM Distance: >3 FB Neck ROM: Full    Dental  (+) Teeth Intact, Dental Advisory Given   Pulmonary asthma    breath sounds clear to auscultation       Cardiovascular hypertension, Pt. on medications  Rhythm:Regular Rate:Normal     Neuro/Psych  Headaches PSYCHIATRIC DISORDERS Anxiety Depression       GI/Hepatic Neg liver ROS,GERD  Medicated and Controlled,,  Endo/Other  diabetes  Class 3 obesity  Renal/GU negative Renal ROS     Musculoskeletal  (+) Arthritis , Rheumatoid disorders,  Fibromyalgia -  Abdominal  (+) + obese  Peds  Hematology  (+) Blood dyscrasia, anemia   Anesthesia Other Findings   Reproductive/Obstetrics                             Anesthesia Physical Anesthesia Plan  ASA: 3  Anesthesia Plan: General   Post-op Pain Management: Regional block* and Tylenol  PO (pre-op)*   Induction: Intravenous  PONV Risk Score and Plan: 4 or greater and Treatment may vary due to age or medical condition, Ondansetron  and Dexamethasone   Airway Management Planned: LMA  Additional Equipment: None  Intra-op Plan:   Post-operative Plan: Extubation in OR  Informed Consent: I have reviewed the patients History and Physical, chart, labs and discussed the procedure including the risks, benefits and alternatives for the proposed anesthesia with the patient or authorized representative who has indicated his/her understanding and acceptance.     Dental advisory given  Plan Discussed with: CRNA  Anesthesia Plan Comments:         Anesthesia Quick Evaluation

## 2023-07-02 NOTE — Progress Notes (Addendum)
Assisted Dr. Germeroth with left, pectoralis, ultrasound guided block. Side rails up, monitors on throughout procedure. See vital signs in flow sheet. Tolerated Procedure well. 

## 2023-07-02 NOTE — Anesthesia Procedure Notes (Addendum)
 Procedure Name: LMA Insertion Date/Time: 07/02/2023 12:17 PM  Performed by: Raymona Caldwell, CRNAPre-anesthesia Checklist: Patient identified, Emergency Drugs available, Suction available and Patient being monitored Patient Re-evaluated:Patient Re-evaluated prior to induction Oxygen Delivery Method: Circle system utilized Preoxygenation: Pre-oxygenation with 100% oxygen Induction Type: IV induction Ventilation: Mask ventilation without difficulty LMA: LMA with gastric port inserted LMA Size: 4.0 Number of attempts: 1 Airway Equipment and Method: Bite block Placement Confirmation: positive ETCO2, breath sounds checked- equal and bilateral and CO2 detector Tube secured with: Tape Dental Injury: Teeth and Oropharynx as per pre-operative assessment  Difficulty Due To: Difficulty was anticipated, Difficult Airway- due to large tongue and Difficult Airway- due to limited oral opening Comments: Pt has a history of difficult airway with lumbar surgery so glidescope readily available, after induction able to mask, inserted a LMA with gastric port #4 easily, bbs, +ETCO2. Lips and teeth as preop.

## 2023-07-02 NOTE — Transfer of Care (Signed)
 Immediate Anesthesia Transfer of Care Note  Patient: Norma [name]  Procedure(s) Performed: BREAST LUMPECTOMY WITH RADIOACTIVE SEED AND SENTINEL LYMPH NODE BIOPSY (Left: Breast) EXCISION, MASS, CHEST WALL (Left: Chest)  Patient Location: PACU  Anesthesia Type:General and Regional  Level of Consciousness: awake, alert , and patient cooperative  Airway & Oxygen Therapy: Patient Spontanous Breathing and Patient connected to face mask oxygen  Post-op Assessment: Report given to RN and Post -op Vital signs reviewed and stable  Post vital signs: Reviewed and stable  Last Vitals:  Vitals Value Taken Time  BP 173/77 07/02/23 1400  Temp 36.4 C 07/02/23 1400  Pulse 94 07/02/23 1405  Resp 14 07/02/23 1405  SpO2 97 % 07/02/23 1405  Vitals shown include unfiled device data.  Last Pain:  Vitals:   07/02/23 0941  TempSrc: Temporal  PainSc: 0-No pain      Patients Stated Pain Goal: 4 (07/02/23 0941)  Complications:  Encounter Notable Events  Notable Event Outcome Phase Comment  Difficult to intubate - expected  Intraprocedure Filed from anesthesia note documentation.

## 2023-07-02 NOTE — Discharge Instructions (Addendum)
 [facility] Office Phone Number [phone]  BREAST BIOPSY/ PARTIAL MASTECTOMY: POST OP INSTRUCTIONS  Always review your discharge instruction sheet given to you by the facility where your surgery was performed.  IF YOU HAVE DISABILITY OR FAMILY LEAVE FORMS, YOU MUST BRING THEM TO THE OFFICE FOR PROCESSING.  DO NOT GIVE THEM TO YOUR DOCTOR.  Take 2 tylenol  (acetominophen) three times a day for 3 days.  If you still have pain, add ibuprofen with food in between if able to take this (if you have kidney issues or stomach issues, do not take ibuprofen).  If both of those are not enough, add the narcotic pain pill.  If you find you are needing a lot of this overnight after surgery, call the next morning for a refill.    Prescriptions will not be filled after 5pm or on week-ends. Take your usually prescribed medications unless otherwise directed You should eat very light the first 24 hours after surgery, such as soup, crackers, pudding, etc.  Resume your normal diet the day after surgery. Most patients will experience some swelling and bruising in the breast.  Ice packs and a good support bra will help.  Swelling and bruising can take several days to resolve.  It is common to experience some constipation if taking pain medication after surgery.  Increasing fluid intake and taking a stool softener will usually help or prevent this problem from occurring.  A mild laxative (Milk of Magnesia or Miralax ) should be taken according to package directions if there are no bowel movements after 48 hours. Unless discharge instructions indicate otherwise, you may remove your bandages 48 hours after surgery, and you may shower at that time.  You may have steri-strips (small skin tapes) in place directly over the incision.  These strips should be left on the skin at least for for 7-10 days.    ACTIVITIES:  You may resume regular daily activities (gradually increasing) beginning the next day.  Wearing a  good support bra or sports bra (or the breast binder) minimizes pain and swelling.  You may have sexual intercourse when it is comfortable. No heavy lifting for 1-2 weeks (not over around 10 pounds).  You may drive when you no longer are taking prescription pain medication, you can comfortably wear a seatbelt, and you can safely maneuver your car and apply brakes. RETURN TO WORK:  __________3-14 days depending on job. _______________ Norma [name] should see your doctor in the office for a follow-up appointment approximately two weeks after your surgery.  Your doctor's nurse will typically make your follow-up appointment when she calls you with your pathology report.  Expect your pathology report 3-4 business days after your surgery.  You may call to check if you do not hear from us  after three days.   WHEN TO CALL YOUR DOCTOR: Fever over 101.0 Nausea and/or vomiting. Extreme swelling or bruising. Continued bleeding from incision. Increased pain, redness, or drainage from the incision.  The clinic staff is available to answer your questions during regular business hours.  Please don't hesitate to call and ask to speak to one of the nurses for clinical concerns.  If you have a medical emergency, go to the nearest emergency room or call 911.  A surgeon from [facility] is always on call at the hospital.  For further questions, please visit [email or web address]   No Tylenol  until after 3:45pm today, if needed.    Post Anesthesia Home Care Instructions  Activity: Get plenty of rest for  the remainder of the day. A responsible individual must stay with you for 24 hours following the procedure.  For the next 24 hours, DO NOT: -Drive a car -Advertising copywriter -Drink alcoholic beverages -Take any medication unless instructed by your physician -Make any legal decisions or sign important papers.  Meals: Start with liquid foods such as gelatin or soup. Progress to regular foods as  tolerated. Avoid greasy, spicy, heavy foods. If nausea and/or vomiting occur, drink only clear liquids until the nausea and/or vomiting subsides. Call your physician if vomiting continues.  Special Instructions/Symptoms: Your throat may feel dry or sore from the anesthesia or the breathing tube placed in your throat during surgery. If this causes discomfort, gargle with warm salt water . The discomfort should disappear within 24 hours.  If you had a scopolamine patch placed behind your ear for the management of post- operative nausea and/or vomiting:  1. The medication in the patch is effective for 72 hours, after which it should be removed.  Wrap patch in a tissue and discard in the trash. Wash hands thoroughly with soap and water . 2. You may remove the patch earlier than 72 hours if you experience unpleasant side effects which may include dry mouth, dizziness or visual disturbances. 3. Avoid touching the patch. Wash your hands with soap and water  after contact with the patch.

## 2023-07-02 NOTE — Op Note (Addendum)
 Left Breast Radioactive seed localized lumpectomy and sentinel lymph node biopsy, excision of 6 cm subcutaneous left chest wall mass  Indications: This patient presents with history of left breast cancer, cT1bN0 grade 3 invasive ductal carcinoma, upper outer quadrant, ER+/PR+/Her2-, left enlarging chest wall mass  Pre-operative Diagnosis: left breast cancer  Post-operative Diagnosis: Same  Surgeon: Lockie Rima   Assistant: n/a  Anesthesia: General endotracheal anesthesia  ASA Class: 3  Procedure Details  The patient was seen in the Holding Room. The risks, benefits, complications, treatment options, and expected outcomes were discussed with the patient. The possibilities of bleeding, infection, the need for additional procedures, failure to diagnose a condition, and creating a complication requiring transfusion or operation were discussed with the patient. The patient concurred with the proposed plan, giving informed consent.  The site of surgery properly noted/marked. The patient was taken to Operating Room # 8, identified, and the procedure verified as left Breast Seed localized Lumpectomy with sentinel lymph node biopsy., excision of left chest wall mass. The left arm, breast, and chest were prepped and draped in standard fashion. A Time Out was held and the above information confirmed. The MagTrace was injected into the left upper outer quadrant.   The lumpectomy was performed by creating a lateral circumareolar incision near the previously placed radioactive seed.  Dissection was carried down to around the point of maximum signal intensity. The cautery was used to perform the dissection.  Hemostasis was achieved with cautery. One clip was placed in the cavity, given the superficial nature of the lumpectomyThe specimen was inked with the margin marker paint kit.    Specimen radiography confirmed inclusion of the mammographic lesion, the clip, and the seed x 2- one radioactive and one  magseed).  The background signal in the breast was zero.  The wound was irrigated and reinspected for hemostasis.  The skin was then closed with 3-0 vicryl in layers and 4-0 monocryl subcuticular suture.    Using a sentimag probe, left axillary sentinel nodes were identified transcutaneously.  An oblique incision was created below the axillary hairline.  Dissection was carried through the clavipectoral fascia.  Two-three deep level two axillary sentinel nodes were removed.  Counts per second were 890, and 50.    The background count was 0 cps.  The wound was irrigated.  Hemostasis was achieved with cautery.  The axillary incision was closed with a 3-0 vicryl deep dermal interrupted sutures and a 4-0 monocryl subcuticular closure.  A 4 cm incision was made over the mass in the upper outer left chest.  The subcutaneous tissues were divided with the cautery.  The mass was bluntly dissected out from the surrounding tissues.  This was elevated and the posterior attachments were divided with the cautery as well.  The cavity was irrigated.  Hemostasis was achieved.  The wound was then closed with interrupted 3-0 vicryl deep dermal sutures and running 4-0 monocryl subcuticular sutures.      Sterile dressings were applied. At the end of the operation, all sponge, instrument, and needle counts were correct.  Findings: grossly clear surgical margins and no adenopathy, anterior margin is skin, fatty mass left upper outer chest.    Estimated Blood Loss:  min         Specimens: left breast tissue with seed, left chest wall mass, and two left axillary sentinel lymph nodes.             Complications:  None; patient tolerated the procedure well.  Disposition: PACU - hemodynamically stable.         Condition: stable

## 2023-07-03 ENCOUNTER — Encounter (HOSPITAL_BASED_OUTPATIENT_CLINIC_OR_DEPARTMENT_OTHER): Payer: Self-pay | Admitting: General Surgery

## 2023-07-03 NOTE — Anesthesia Postprocedure Evaluation (Signed)
 Anesthesia Post Note  Patient: Norma [name]  Procedure(s) Performed: BREAST LUMPECTOMY WITH RADIOACTIVE SEED AND SENTINEL LYMPH NODE BIOPSY (Left: Breast) EXCISION, MASS, CHEST WALL (Left: Chest)     Patient location during evaluation: PACU Anesthesia Type: General Level of consciousness: sedated and patient cooperative Pain management: pain level controlled Vital Signs Assessment: post-procedure vital signs reviewed and stable Respiratory status: spontaneous breathing Cardiovascular status: stable Anesthetic complications: yes   Encounter Notable Events  Notable Event Outcome Phase Comment  Difficult to intubate - expected  Intraprocedure Filed from anesthesia note documentation.    Last Vitals:  Vitals:   07/02/23 1443 07/02/23 1455  BP: 135/75 (!) 140/74  Pulse: 85 93  Resp: 17 16  Temp:  (!) 36.2 C  SpO2: 95% 93%    Last Pain:  Vitals:   07/03/23 0914  TempSrc:   PainSc: 0-No pain                 Gorman Laughter

## 2023-07-05 ENCOUNTER — Telehealth: Payer: Self-pay | Admitting: *Deleted

## 2023-07-05 ENCOUNTER — Encounter: Payer: Self-pay | Admitting: *Deleted

## 2023-07-05 LAB — SURGICAL PATHOLOGY

## 2023-07-05 IMAGING — MG MM DIGITAL DIAGNOSTIC UNILAT*L* W/ TOMO W/ CAD
4 series · 4 of 12 positions shown · non-contrast
Comparison: Previous exam(s).

CLINICAL DATA: 50-year-old female recalled from screening mammogram
dated 02/22/2021 for a possible left breast mass. The patient also
states she has a large palpable lump in the upper left breast,
unchanged for several years.

EXAM:
DIGITAL DIAGNOSTIC UNILATERAL LEFT MAMMOGRAM WITH TOMOSYNTHESIS AND
CAD; ULTRASOUND LEFT BREAST LIMITED
TECHNIQUE: Left digital diagnostic mammography and breast tomosynthesis was
performed. The images were evaluated with computer-aided detection.;
Targeted ultrasound examination of the left breast was performed.

[L MLO synth-2D]
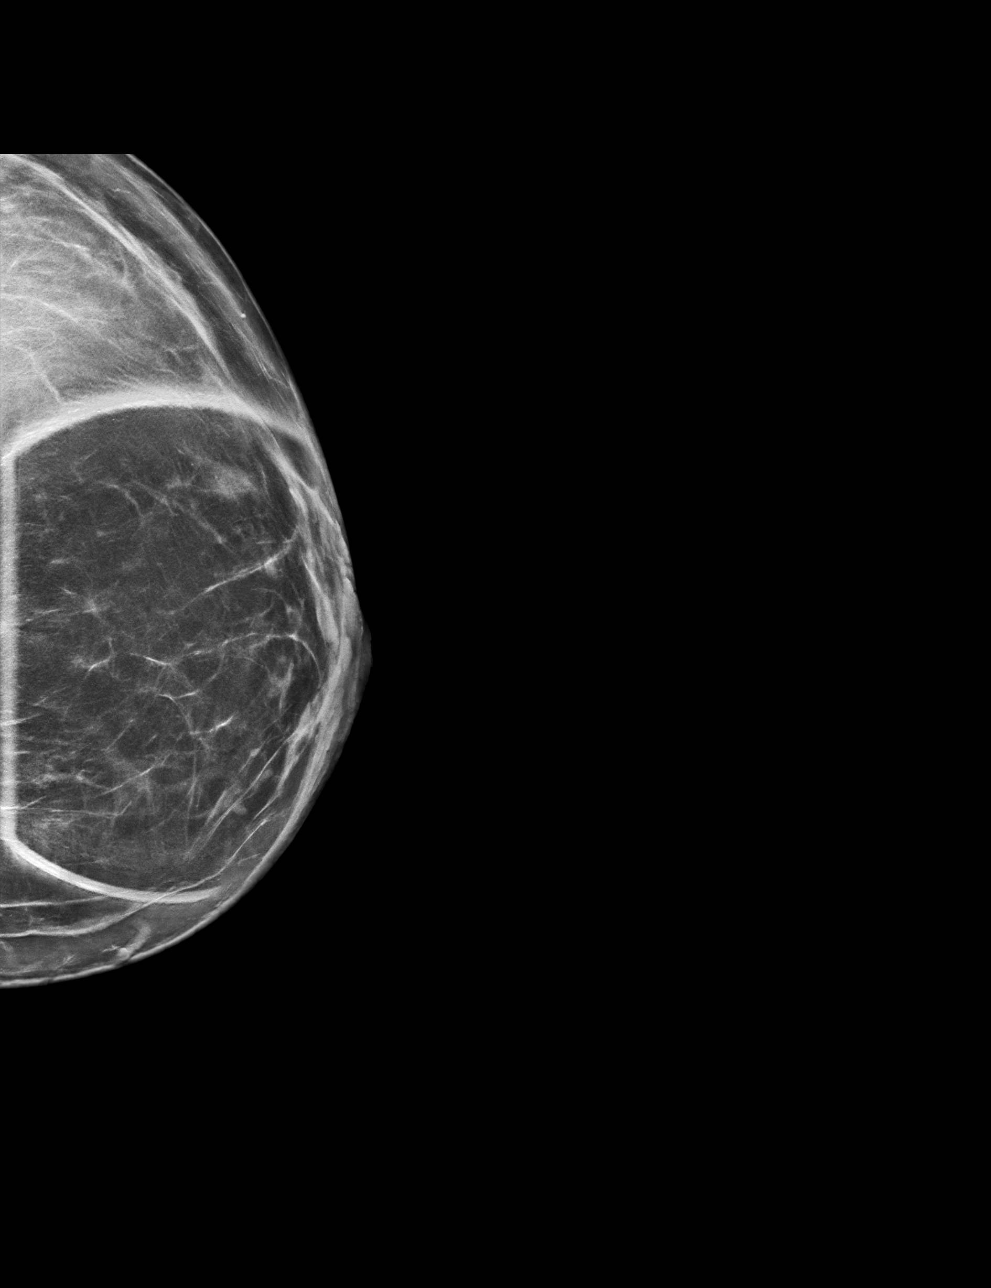

[L CC synth-2D]
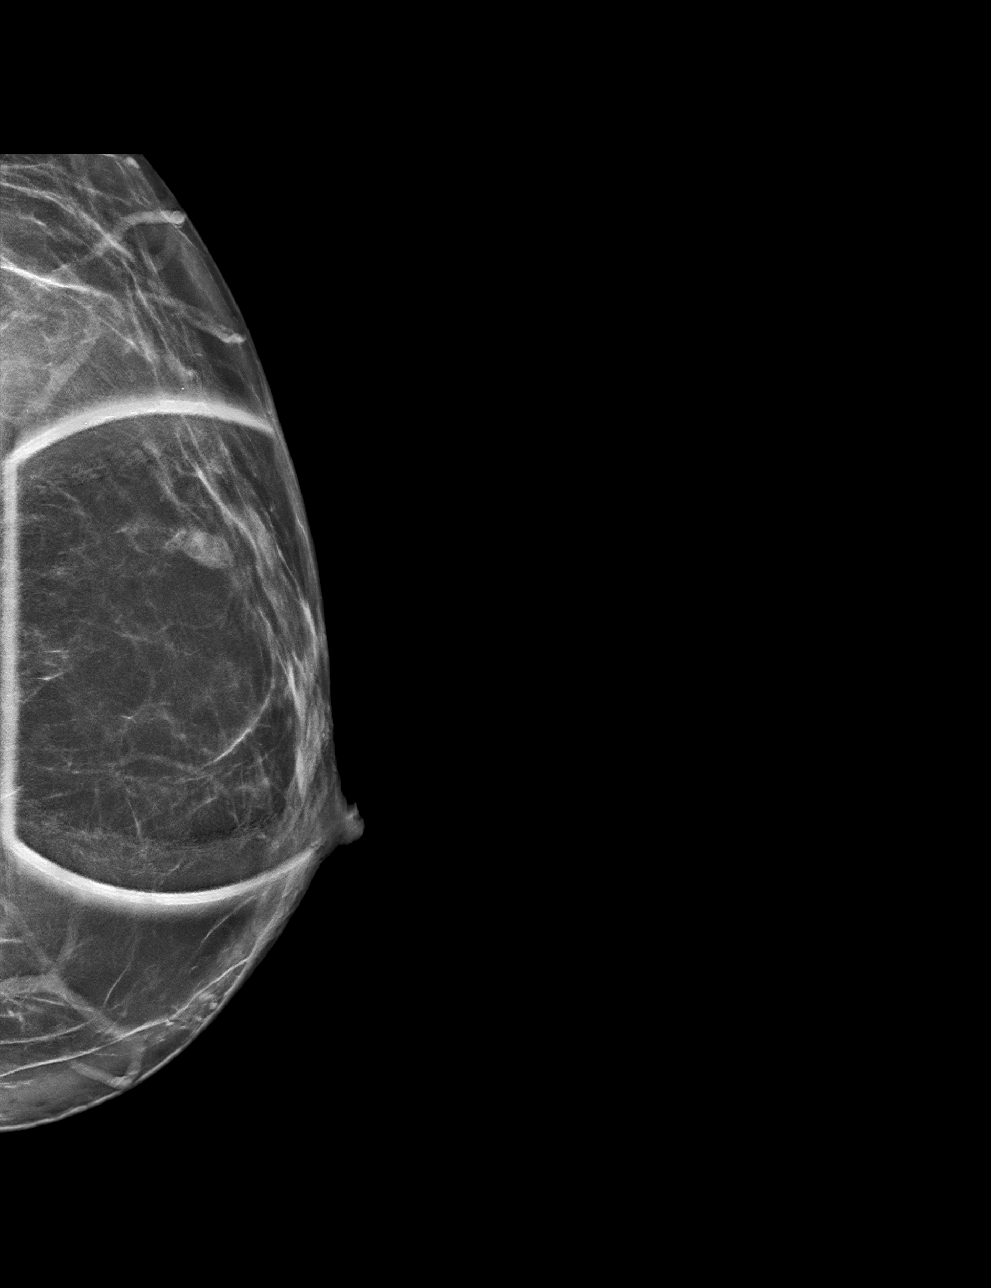

[L CC tomo · tomo slice 33/66.0]
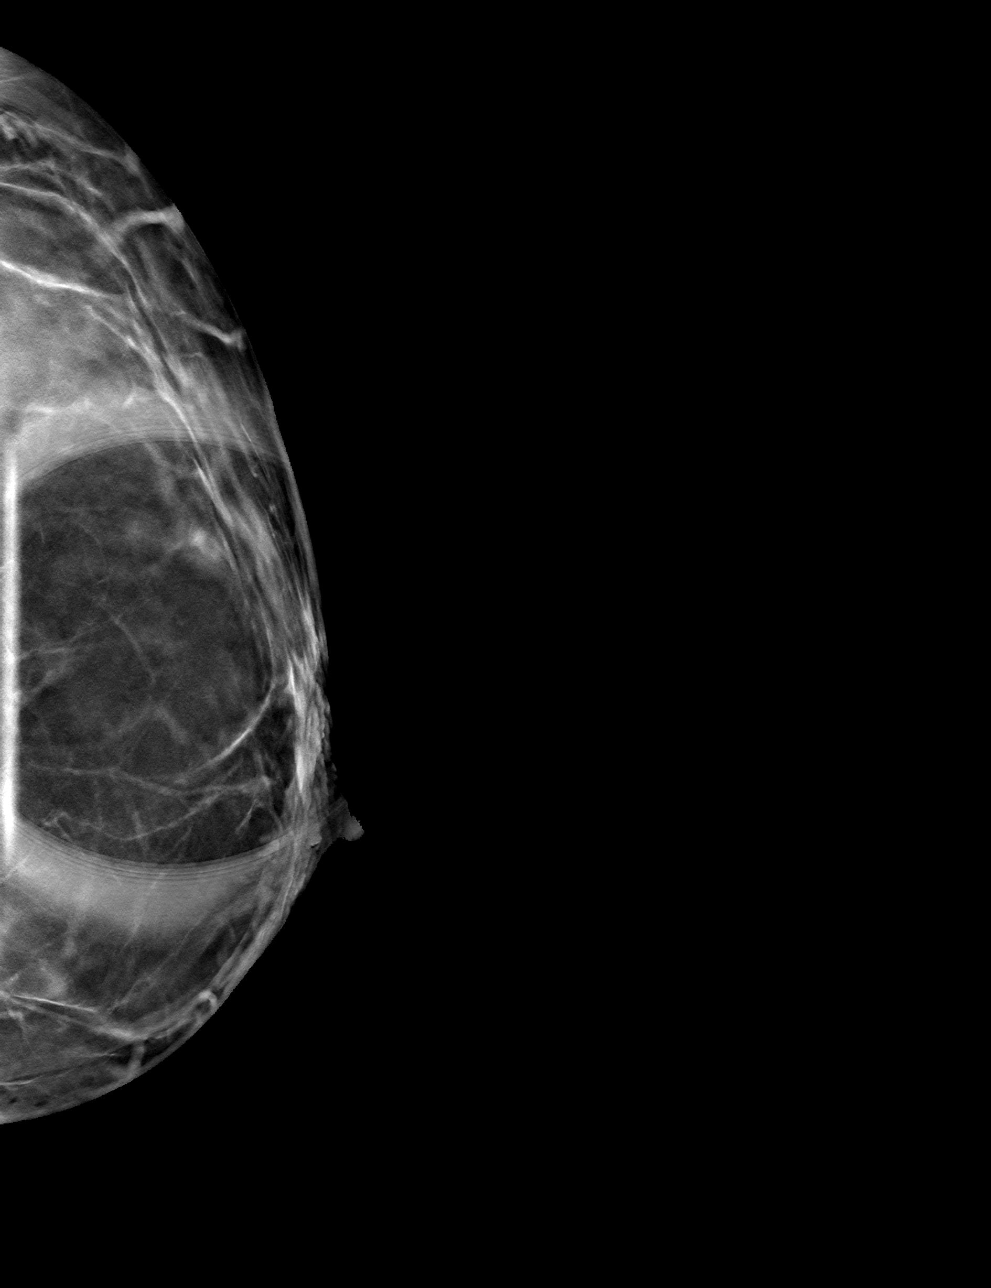

[L MLO tomo · tomo slice 38/75.0]
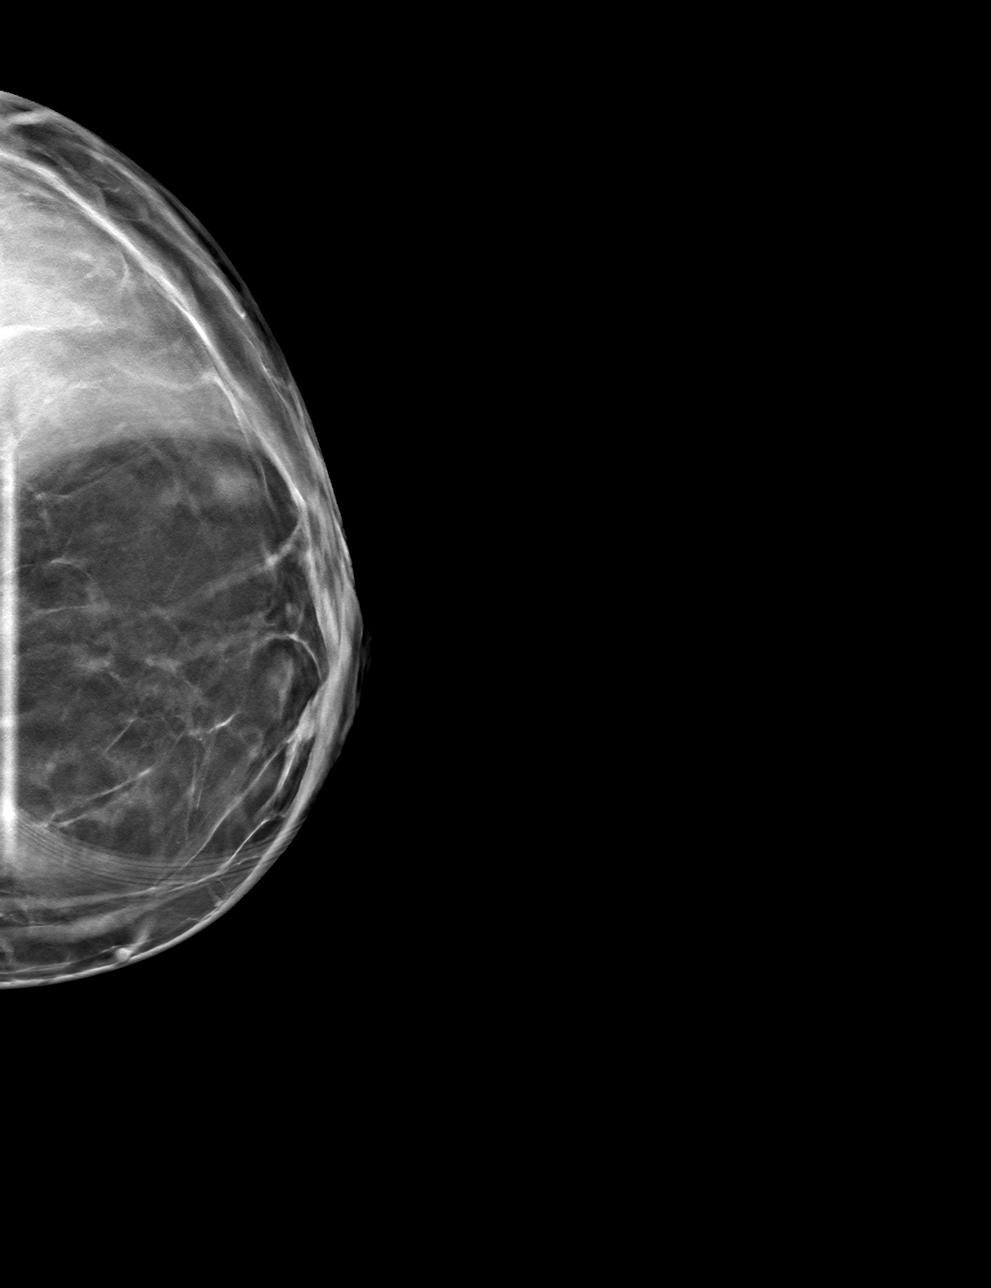

[4 of 12 positions shown; findings below may reference images not displayed]

ACR Breast Density Category b: There are scattered areas of
fibroglandular density.
FINDINGS: There is a persistent oval, circumscribed equal density mass in the
upper outer left breast at anterior depth. Further evaluation with
ultrasound was performed.

Targeted ultrasound is performed, showing an oval, circumscribed
hypoechoic mass at the [DATE] position 3 cm from the nipple. It
measures 7 x 6 x 6 mm. There is no internal vascularity. This
correlates well with the mammographic finding. A large oval,
circumscribed isodense mass is demonstrated along the upper left
chest wall, measuring 5.4 x 1.9 x 6.0 cm. This is consistent with a
lipoma, and unchanged for several years per patient. Evaluation of
the left axilla demonstrates no suspicious lymphadenopathy.
IMPRESSION: 1. Indeterminate left breast mass at the [DATE] position 3 cm from the
nipple. Recommendation is for ultrasound-guided biopsy.
2. Benign 6 cm lipoma along the upper left chest wall.
Recommendation is for clinical and symptomatic follow-up.
3. No suspicious left axillary lymphadenopathy.

RECOMMENDATION:
Ultrasound-guided biopsy of the left breast.

I have discussed the findings and recommendations with the patient.
If applicable, a reminder letter will be sent to the patient
regarding the next appointment.

BI-RADS CATEGORY  4: Suspicious.

## 2023-07-05 IMAGING — US US BREAST*L* LIMITED INC AXILLA
1 series · 13 of 19 positions shown · non-contrast
Comparison: Previous exam(s).

CLINICAL DATA: 50-year-old female recalled from screening mammogram
dated 02/22/2021 for a possible left breast mass. The patient also
states she has a large palpable lump in the upper left breast,
unchanged for several years.

EXAM:
DIGITAL DIAGNOSTIC UNILATERAL LEFT MAMMOGRAM WITH TOMOSYNTHESIS AND
CAD; ULTRASOUND LEFT BREAST LIMITED
TECHNIQUE: Left digital diagnostic mammography and breast tomosynthesis was
performed. The images were evaluated with computer-aided detection.;
Targeted ultrasound examination of the left breast was performed.

[Series 1: us breast*left* limited inc axilla · 0.06mm/px · 13 of 19 slices shown]
[im 1/19]
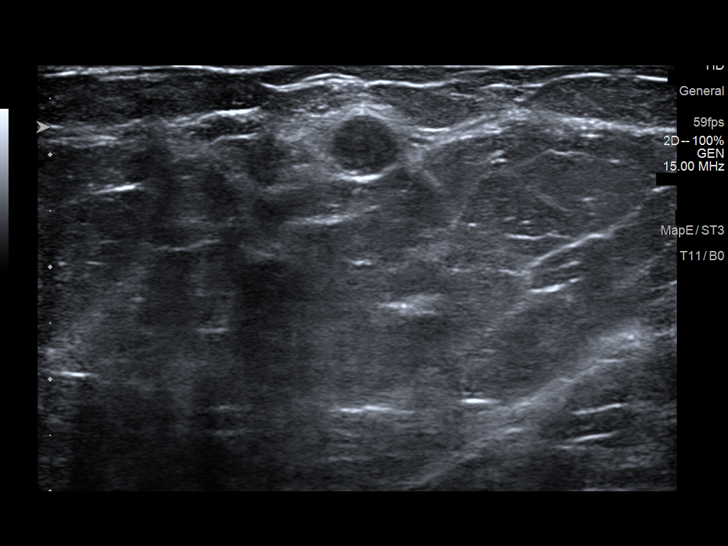
[im 3/19]
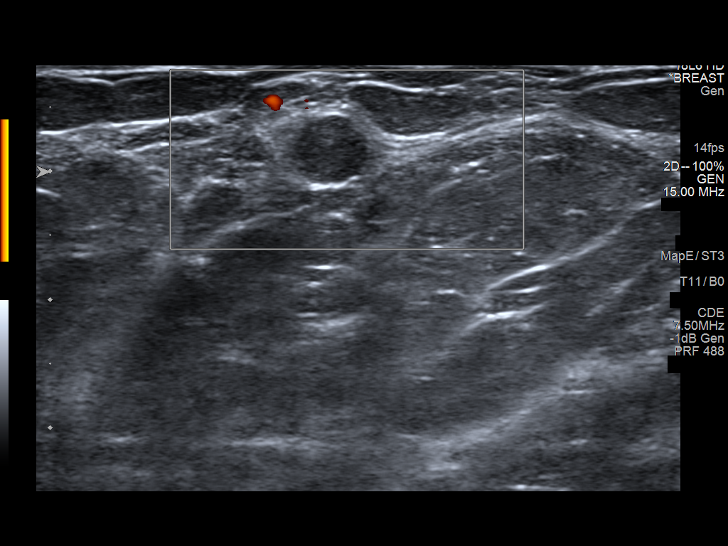
[im 4/19]
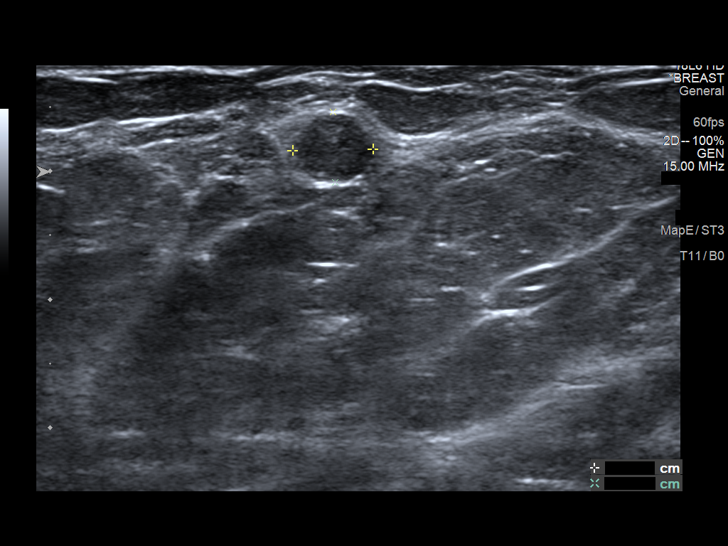
[im 6/19]
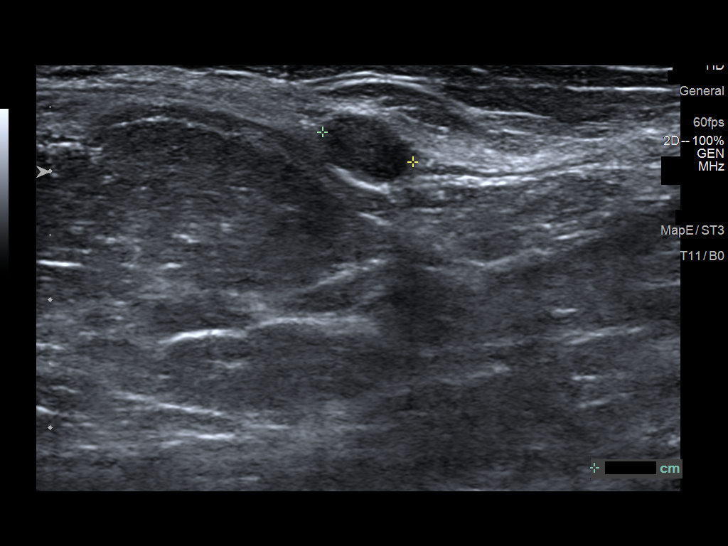
[im 7/19]
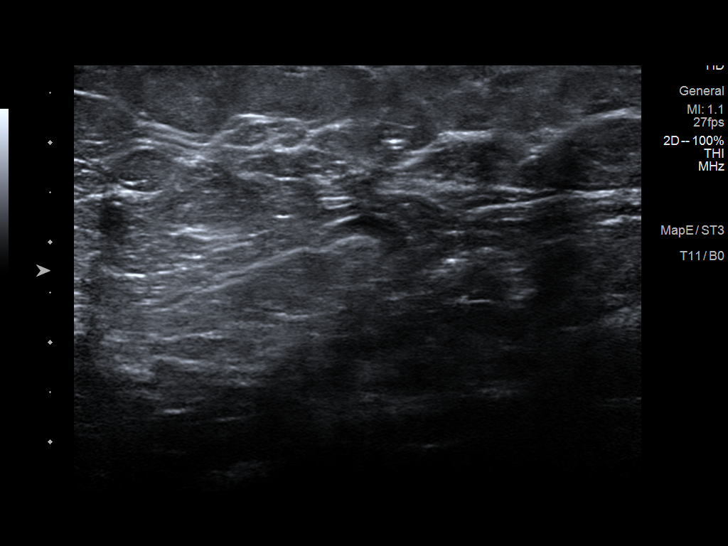
[im 9/19]
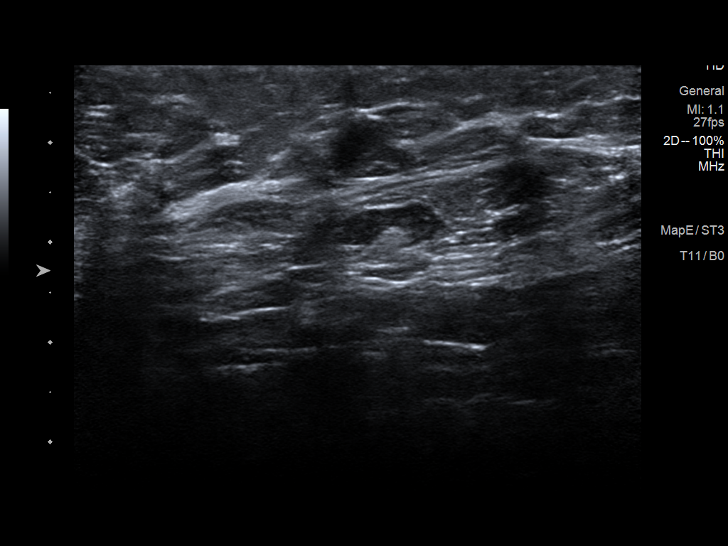
[im 10/19]
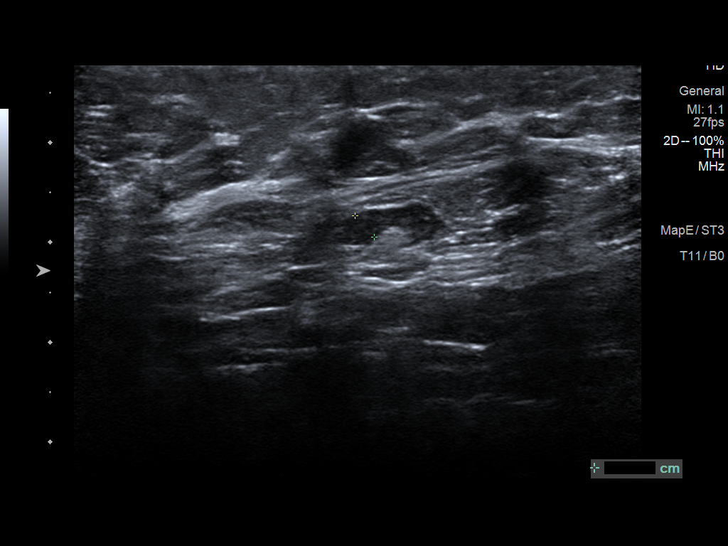
[im 11/19]
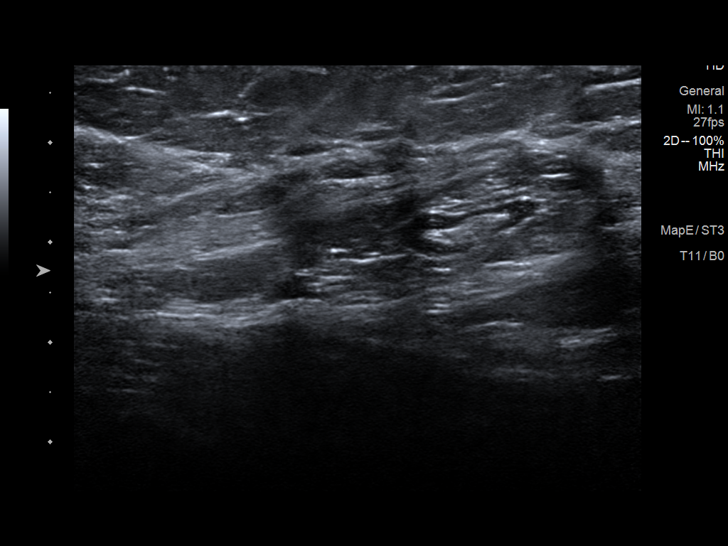
[im 13/19]
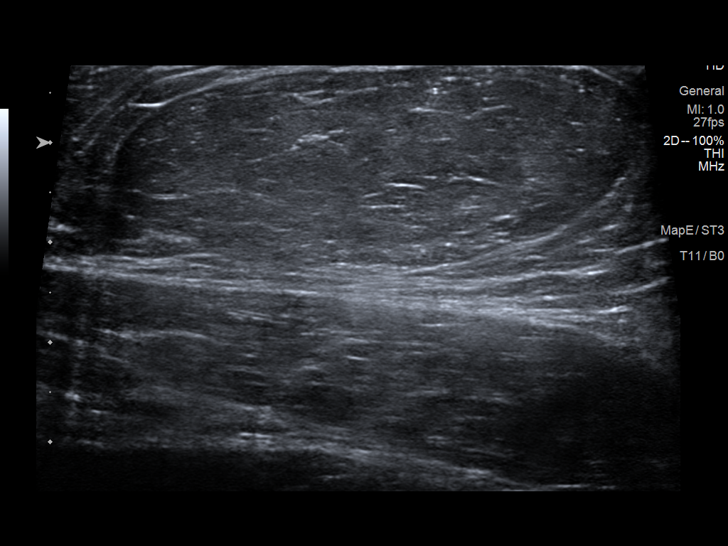
[im 14/19]
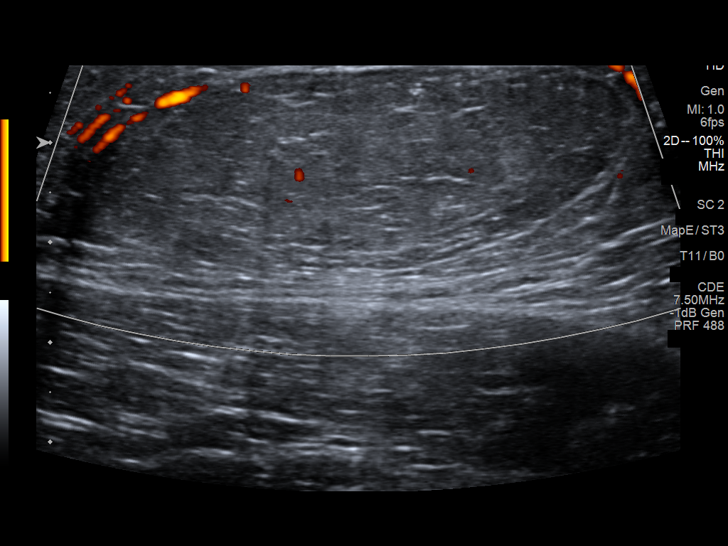
[im 16/19]
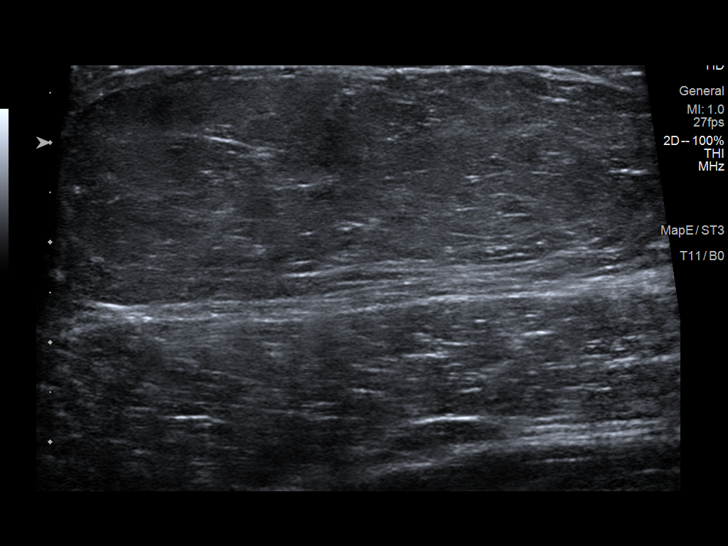
[im 17/19]
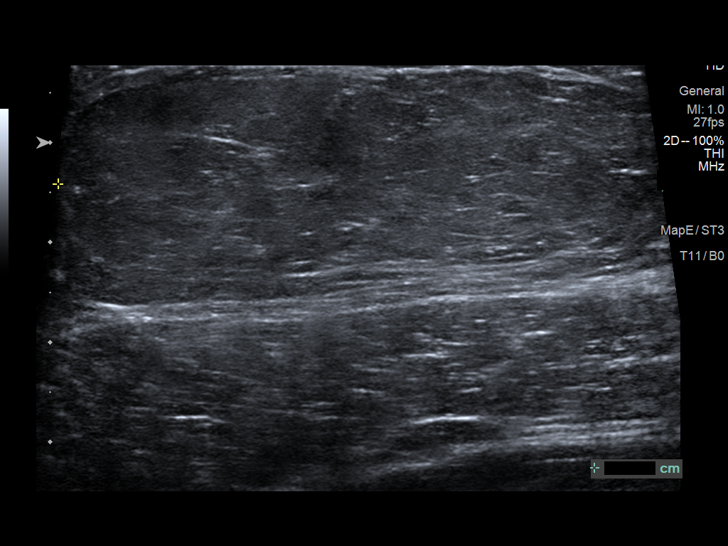
[im 19/19]
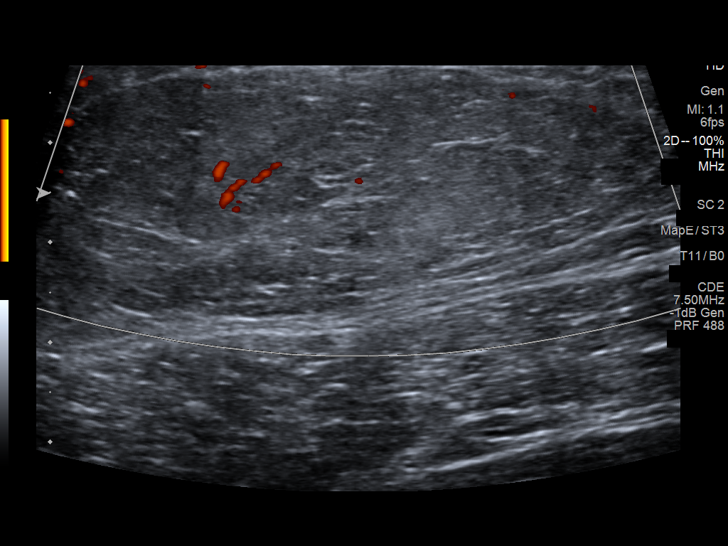

[13 of 19 positions shown; findings below may reference images not displayed]

ACR Breast Density Category b: There are scattered areas of
fibroglandular density.
FINDINGS: There is a persistent oval, circumscribed equal density mass in the
upper outer left breast at anterior depth. Further evaluation with
ultrasound was performed.

Targeted ultrasound is performed, showing an oval, circumscribed
hypoechoic mass at the [DATE] position 3 cm from the nipple. It
measures 7 x 6 x 6 mm. There is no internal vascularity. This
correlates well with the mammographic finding. A large oval,
circumscribed isodense mass is demonstrated along the upper left
chest wall, measuring 5.4 x 1.9 x 6.0 cm. This is consistent with a
lipoma, and unchanged for several years per patient. Evaluation of
the left axilla demonstrates no suspicious lymphadenopathy.
IMPRESSION: 1. Indeterminate left breast mass at the [DATE] position 3 cm from the
nipple. Recommendation is for ultrasound-guided biopsy.
2. Benign 6 cm lipoma along the upper left chest wall.
Recommendation is for clinical and symptomatic follow-up.
3. No suspicious left axillary lymphadenopathy.

RECOMMENDATION:
Ultrasound-guided biopsy of the left breast.

I have discussed the findings and recommendations with the patient.
If applicable, a reminder letter will be sent to the patient
regarding the next appointment.

BI-RADS CATEGORY  4: Suspicious.

## 2023-07-05 NOTE — Telephone Encounter (Signed)
Received order for Oncotype Testing per Dr. Chryl Heck. Requisition faxed to pathology and [facility]

## 2023-07-12 ENCOUNTER — Telehealth: Payer: Self-pay

## 2023-07-12 ENCOUNTER — Encounter (HOSPITAL_COMMUNITY): Payer: Self-pay

## 2023-07-12 NOTE — Telephone Encounter (Signed)
 Pt called to find out why her test results "went away" after resulting on MyChart. Advised pt it gets uploaded manually so she may not be able to see her onco score. Advised MD will go over results at 5/21 visit. She verbalized thanks and understanding.

## 2023-07-15 ENCOUNTER — Telehealth: Payer: Self-pay | Admitting: *Deleted

## 2023-07-15 ENCOUNTER — Encounter: Payer: Self-pay | Admitting: *Deleted

## 2023-07-15 NOTE — Telephone Encounter (Signed)
 Received Oncotype Score of 22. Physician team notified. Called pt with results. Informed chemo not recommended and next step is XRT. Received verbal understanding. Referral placed for Dr. Jeryl Moris

## 2023-07-25 ENCOUNTER — Ambulatory Visit: Attending: General Surgery | Admitting: Physical Therapy

## 2023-07-25 ENCOUNTER — Encounter: Payer: Self-pay | Admitting: Physical Therapy

## 2023-07-25 DIAGNOSIS — C50412 Malignant neoplasm of upper-outer quadrant of left female breast: Secondary | ICD-10-CM | POA: Insufficient documentation

## 2023-07-25 DIAGNOSIS — M25612 Stiffness of left shoulder, not elsewhere classified: Secondary | ICD-10-CM | POA: Diagnosis present

## 2023-07-25 DIAGNOSIS — Z170 Estrogen receptor positive status [ER+]: Secondary | ICD-10-CM | POA: Insufficient documentation

## 2023-07-25 DIAGNOSIS — R293 Abnormal posture: Secondary | ICD-10-CM | POA: Insufficient documentation

## 2023-07-25 DIAGNOSIS — Z483 Aftercare following surgery for neoplasm: Secondary | ICD-10-CM | POA: Diagnosis present

## 2023-07-25 NOTE — Therapy (Signed)
 OUTPATIENT PHYSICAL THERAPY BREAST CANCER POST OP FOLLOW UP   Patient Name: Norma [name] MRN: [id number] DOB:[birth year], 53 y.o., female Today's Date: 07/25/2023  END OF SESSION:  PT End of Session - 07/25/23 1106     Visit Number 2    Number of Visits 10    Date for PT Re-Evaluation 08/22/23    PT Start Time 1105    PT Stop Time 1149    PT Time Calculation (min) 44 min    Activity Tolerance Patient tolerated treatment well    Behavior During Therapy WFL for tasks assessed/performed             Past Medical History:  Diagnosis Date   Allergy    Anemia    hx of   Anxiety    Arthritis    KNEES   Asthma    treated for during COVID tx   Cancer (HCC) 06/2023   left breast IDC   Chronic headache    Colon polyps    TUBULAR ADENOMAS AND HYPERPLASTIC    Complication of anesthesia    DDD (degenerative disc disease), thoracic    Depression    Diabetes mellitus, type 2 (HCC)    Difficult airway for intubation    Fibromyalgia    GERD (gastroesophageal reflux disease)    Goiter    Hyperlipidemia    Hypertension    Low back pain    Obesity    PONV (postoperative nausea and vomiting)    RA (rheumatoid arthritis) (HCC)    Seasonal allergies    Vitamin D  deficiency    Past Surgical History:  Procedure Laterality Date   BACK SURGERY  2005   lumbar laminectomy   BALLOON DILATION N/A 06/01/2021   Procedure: BALLOON DILATION;  Surgeon: Nannette Babe, MD;  Location: [facility];  Service: Gastroenterology;  Laterality: N/A;   BARTHOLIN GLAND CYST EXCISION     BIOPSY  06/01/2021   Procedure: BIOPSY;  Surgeon: Nannette Babe, MD;  Location: [facility];  Service: Gastroenterology;;   BREAST LUMPECTOMY WITH RADIOACTIVE SEED AND SENTINEL LYMPH NODE BIOPSY Left 07/02/2023   Procedure: BREAST LUMPECTOMY WITH RADIOACTIVE SEED AND SENTINEL LYMPH NODE BIOPSY;  Surgeon: Lockie Rima, MD;  Location: [facility];  Service: General;  Laterality: Left;  90 MIN 40981-  EXCISION LEFT CHEST WALL MASS GEN COMBINED WITH REGIONAL   COLONOSCOPY  2017   JMP-MAC-prep good-TA -recall 5 yr   COLONOSCOPY WITH PROPOFOL  N/A 06/01/2021   Procedure: COLONOSCOPY WITH PROPOFOL ;  Surgeon: Nannette Babe, MD;  Location: [facility];  Service: Gastroenterology;  Laterality: N/A;   ESOPHAGOGASTRODUODENOSCOPY (EGD) WITH PROPOFOL  N/A 06/01/2021   Procedure: ESOPHAGOGASTRODUODENOSCOPY (EGD) WITH PROPOFOL ;  Surgeon: Nannette Babe, MD;  Location: [facility];  Service: Gastroenterology;  Laterality: N/A;   LUMBAR LAMINECTOMY/DECOMPRESSION MICRODISCECTOMY Right 07/17/2013   Procedure: LUMBAR LAMINECTOMY/DECOMPRESSION MICRODISCECTOMY 1 LEVEL,RIGHT LUMBAR FOUR-FIVE;  Surgeon: Augustine Blocker, MD;  Location: [facility];  Service: Neurosurgery;  Laterality: Right;  Right   MASS EXCISION Left 07/02/2023   Procedure: EXCISION, MASS, CHEST WALL;  Surgeon: Lockie Rima, MD;  Location: [facility];  Service: General;  Laterality: Left;   PARTIAL HYSTERECTOMY     POLYPECTOMY  2017   TA and benign polypoid   POLYPECTOMY  06/01/2021   Procedure: POLYPECTOMY;  Surgeon: Nannette Babe, MD;  Location: [facility];  Service: Gastroenterology;;   WISDOM TOOTH EXTRACTION     Patient Active Problem List   Diagnosis Date  Noted   Genetic testing 06/23/2023   Malignant neoplasm of upper-outer quadrant of left breast in female, estrogen receptor positive (HCC) 06/11/2023   Gastritis without bleeding    Esophageal dysphagia    Schatzki's ring    Benign neoplasm of ascending colon    Class 3 severe obesity with serious comorbidity and body mass index (BMI) of 40.0 to 44.9 in adult 08/25/2019   Type 2 diabetes mellitus without complication, without long-term current use of insulin (HCC) 08/25/2019   Chronic cough 06/27/2017   LUQ abdominal pain 12/07/2014   RUQ abdominal pain 11/10/2013   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   Gastroesophageal reflux disease 12/18/2012   Hx of adenomatous  colonic polyps 12/18/2012   Chronic RLQ pain 12/18/2012   IBS (irritable bowel syndrome) 01/22/2011   HTN (hypertension) 01/22/2011   Hyperlipidemia 01/22/2011   Anxiety and depression 01/22/2011    PCP: Winda Hastings, NP  REFERRING PROVIDER: Dr. Lockie Rima   REFERRING DIAG: Left breast cancer  THERAPY DIAG:  Stiffness of left shoulder, not elsewhere classified  Aftercare following surgery for neoplasm  Abnormal posture  Malignant neoplasm of upper-outer quadrant of left breast in female, estrogen receptor positive (HCC)  Rationale for Evaluation and Treatment: Rehabilitation  ONSET DATE: 04/26/23  SUBJECTIVE:                                                                                                                                                                                           SUBJECTIVE STATEMENT: No one prepared me for the nerve pain I have had after surgery. I can tell my bra is irritating that one spot under my armpit. It has gotten worse as the weeks have gone on instead of better.   PERTINENT HISTORY:  Patient was diagnosed on 04/26/2023 with left grade 3 invasive ductal carcinoma breast cancer. It measures 1 cm and is located in the upper outer quadrant. It is ER/PR positive and HER2 negative with a Ki67 of 30%. Pt underwent a L lumpectomy and SLNB 0/2 on 07/02/23  PATIENT GOALS:  Reassess how my recovery is going related to arm function, pain, and swelling.  PAIN:  Are you having pain? Yes: NPRS scale: 3 Pain location: in L axilla Pain description: irritated Aggravating factors: clothing rubbing it, touching it Relieving factors: when she lightly massages it  PRECAUTIONS: Recent Surgery, left UE Lymphedema risk,   RED FLAGS: None   ACTIVITY LEVEL / LEISURE: doing post op exercises   OBJECTIVE:   PATIENT SURVEYS:  QUICK DASH:  Quick Dash - 07/25/23 0001     Open a tight or new jar Moderate difficulty  Do heavy household chores (wash  walls, wash floors) Mild difficulty    Carry a shopping bag or briefcase No difficulty    Wash your back Mild difficulty    Use a knife to cut food No difficulty    Recreational activities in which you take some force or impact through your arm, shoulder, or hand (golf, hammering, tennis) Mild difficulty    During the past week, to what extent has your arm, shoulder or hand problem interfered with your normal social activities with family, friends, neighbors, or groups? Modererately    During the past week, to what extent has your arm, shoulder or hand problem limited your work or other regular daily activities Modererately    Arm, shoulder, or hand pain. Severe    Tingling (pins and needles) in your arm, shoulder, or hand Moderate    Difficulty Sleeping Moderate difficulty    DASH Score 36.36 %              OBSERVATIONS: Healing scars with some increased scar tissue at L axilla  POSTURE:  Forward head and rounded shoulders posture   UPPER EXTREMITY AROM/PROM:   A/PROM RIGHT   eval    Shoulder extension 48  Shoulder flexion 153  Shoulder abduction 165  Shoulder internal rotation 63  Shoulder external rotation 84                          (Blank rows = not tested)   A/PROM LEFT   eval LEFT  07/25/23  Shoulder extension 35 70  Shoulder flexion 150 162  Shoulder abduction 167 156  Shoulder internal rotation 63 69  Shoulder external rotation 75 80                          (Blank rows = not tested)   CERVICAL AROM: All within normal limits   UPPER EXTREMITY STRENGTH: WFL   LYMPHEDEMA ASSESSMENTS (in cm):    LANDMARK RIGHT   eval  10 cm proximal to olecranon process 38.8  Olecranon process 28.7  10 cm proximal to ulnar styloid process 25.8  Just proximal to ulnar styloid process 17  Across hand at thumb web space 20.5  At base of 2nd digit 5.9  (Blank rows = not tested)   LANDMARK LEFT   eval LEFT 07/25/23  10 cm proximal to olecranon process 40 38.4   Olecranon process 28.8 29.5  10 cm proximal to ulnar styloid process 24.3 23  Just proximal to ulnar styloid process 16.3 16.5  Across hand at thumb web space 19.5 20  At base of 2nd digit 5.9 5.8  (Blank rows = not tested)  Surgery type/Date: 07/02/23 L breast lumpectomy and SLNB Number of lymph nodes removed: 0/2 Current/past treatment (chemo, radiation, hormone therapy): does not need chemo, will require radiation, will need hormone therapy Other symptoms:  Heaviness/tightness Yes Pain Yes Pitting edema No Infections No Decreased scar mobility Yes Stemmer sign No   TREATMENT PERFORMED:  07/25/23: Pulleys x 2 min in direction of abduction and in direction of flexion with pt returning therapist demo and feeling decreased discomfort in axilla by the end Instructed pt in supine dowel exercises as follows with pt returning therapist demo: L shoulder abduction x 5 reps with 3 sec holds, bilateral shoulder flexion x 10 reps with 3 sec holds  PATIENT EDUCATION:  Education details: nerve desensitization, supine dowel exercises, lymphedema risk reduction and  importance of skin care Person educated: Patient Education method: Explanation, Demonstration, Tactile cues, and Handouts Education comprehension: verbalized understanding and returned demonstration  HOME EXERCISE PROGRAM: Reviewed previously given post op HEP. Supine dowel exercises in to abduction and flexion  ASSESSMENT:  CLINICAL IMPRESSION: Pt returns to PT after undergoing a L lumpectomy and SLNB 0/2 on 07/02/23. She has returned to baseline shoulder ROM in all directions except for abduction. She has tightness and discomfort when moving in to abduction. She most likely has deep cording that is difficult to palpate because her discomfort is in her L axilla extending down her upper arm a little ways. She also has been having nerve pain since surgery. She would benefit from skilled PT services to learn nerve desensitization,  improve L shoulder abduction and to decrease discomfort with ROM.   Pt will benefit from skilled therapeutic intervention to improve on the following deficits: Decreased knowledge of precautions, impaired UE functional use, pain, decreased ROM, postural dysfunction.   PT treatment/interventions: ADL/Self care home management, [id number]- PT Re-evaluation, 97110-Therapeutic exercises, 97530- Therapeutic activity, [id number]- Neuromuscular re-education, 97535- Self Care, 46962- Manual therapy, [id number]- Orthotic Initial, and [id number]- Orthotic/Prosthetic subsequent   GOALS: Goals reviewed with patient? Yes  LONG TERM GOALS:  (STG=LTG)  GOALS Name Target Date  Goal status  1 Pt will demonstrate she has regained full shoulder ROM and function post operatively compared to baselines.  Baseline: 08/22/23 INITIAL  2 Pt will report no pain at end range of L shoulder abduction to allow improved function. 08/22/23 INITIAL  3 Pt will be independent in a home exercise program for continued stretching and strengthening.  08/22/23 INITIAL     PLAN:  PT FREQUENCY/DURATION: 2x/wk for 4 wks  PLAN FOR NEXT SESSION: pulleys, ball, PROM to L shoulder, MFR to possible cording in L axilla   [facility]  [address]  [place] [address]  [phone]  After Breast Cancer Class Video It is recommended you view the ABC class video to be educated on lymphedema risk reduction. This video lasts for about 30 minutes. It can be viewed on our website here: [email or web address]  Scar massage at 6 weeks - once fully healed You can begin gentle scar massage to you incision sites. Gently place one hand on the incision and move the skin (without sliding on the skin) in various directions. Do this for a few minutes and then you can gently massage either coconut oil or vitamin E cream into the scars.  Compression garment You should  continue wearing your compression bra until you feel like you no longer have swelling.  Home exercise Program Continue doing the exercises you were given until you feel like you can do them without feeling any tightness at the end.   Walking Program Studies show that 30 minutes of walking per day (fast enough to elevate your heart rate) can significantly reduce the risk of a cancer recurrence. If you can't walk due to other medical reasons, we encourage you to find another activity you could do (like a stationary bike or water  exercise).  Posture After breast cancer surgery, people frequently sit with rounded shoulders posture because it puts their incisions on slack and feels better. If you sit like this and scar tissue forms in that position, you can become very tight and have pain sitting or standing with good posture. Try to be aware of your posture and sit and stand up tall to heal properly.  Follow up PT:  It is recommended you return every 3 months for the first 3 years following surgery to be assessed on the SOZO machine for an L-Dex score. This helps prevent clinically significant lymphedema in 95% of patients. These follow up screens are 10 minute appointments that you are not billed for.  [facility] [place], PT 07/25/2023, 11:55 AM

## 2023-07-29 ENCOUNTER — Other Ambulatory Visit: Payer: Self-pay | Admitting: Nurse Practitioner

## 2023-07-29 ENCOUNTER — Ambulatory Visit

## 2023-07-29 DIAGNOSIS — M25612 Stiffness of left shoulder, not elsewhere classified: Secondary | ICD-10-CM

## 2023-07-29 DIAGNOSIS — R293 Abnormal posture: Secondary | ICD-10-CM

## 2023-07-29 DIAGNOSIS — Z483 Aftercare following surgery for neoplasm: Secondary | ICD-10-CM

## 2023-07-29 DIAGNOSIS — C50412 Malignant neoplasm of upper-outer quadrant of left female breast: Secondary | ICD-10-CM

## 2023-07-29 NOTE — Therapy (Signed)
 OUTPATIENT PHYSICAL THERAPY BREAST CANCER TREATMENT   Patient Name: Norma [name] MRN: [id number] DOB:1970/05/14, 53 y.o., female Today's Date: 07/29/2023  END OF SESSION:  PT End of Session - 07/29/23 1405     Visit Number 3    Number of Visits 10    Date for PT Re-Evaluation 08/22/23    PT Start Time 1402    PT Stop Time 1501    PT Time Calculation (min) 59 min    Activity Tolerance Patient tolerated treatment well    Behavior During Therapy [facility] for tasks assessed/performed             Past Medical History:  Diagnosis Date   Allergy    Anemia    hx of   Anxiety    Arthritis    KNEES   Asthma    treated for during COVID tx   Cancer (HCC) 06/2023   left breast IDC   Chronic headache    Colon polyps    TUBULAR ADENOMAS AND HYPERPLASTIC    Complication of anesthesia    DDD (degenerative disc disease), thoracic    Depression    Diabetes mellitus, type 2 (HCC)    Difficult airway for intubation    Fibromyalgia    GERD (gastroesophageal reflux disease)    Goiter    Hyperlipidemia    Hypertension    Low back pain    Obesity    PONV (postoperative nausea and vomiting)    RA (rheumatoid arthritis) (HCC)    Seasonal allergies    Vitamin D  deficiency    Past Surgical History:  Procedure Laterality Date   BACK SURGERY  2005   lumbar laminectomy   BALLOON DILATION N/A 06/01/2021   Procedure: BALLOON DILATION;  Surgeon: Nannette Babe, MD;  Location: [facility];  Service: Gastroenterology;  Laterality: N/A;   BARTHOLIN GLAND CYST EXCISION     BIOPSY  06/01/2021   Procedure: BIOPSY;  Surgeon: Nannette Babe, MD;  Location: [facility];  Service: Gastroenterology;;   BREAST LUMPECTOMY WITH RADIOACTIVE SEED AND SENTINEL LYMPH NODE BIOPSY Left 07/02/2023   Procedure: BREAST LUMPECTOMY WITH RADIOACTIVE SEED AND SENTINEL LYMPH NODE BIOPSY;  Surgeon: Lockie Rima, MD;  Location: [facility];  Service: General;  Laterality: Left;  90 MIN 40981- EXCISION  LEFT CHEST WALL MASS GEN COMBINED WITH REGIONAL   COLONOSCOPY  2017   JMP-MAC-prep good-TA -recall 5 yr   COLONOSCOPY WITH PROPOFOL  N/A 06/01/2021   Procedure: COLONOSCOPY WITH PROPOFOL ;  Surgeon: Nannette Babe, MD;  Location: [facility];  Service: Gastroenterology;  Laterality: N/A;   ESOPHAGOGASTRODUODENOSCOPY (EGD) WITH PROPOFOL  N/A 06/01/2021   Procedure: ESOPHAGOGASTRODUODENOSCOPY (EGD) WITH PROPOFOL ;  Surgeon: Nannette Babe, MD;  Location: [facility];  Service: Gastroenterology;  Laterality: N/A;   LUMBAR LAMINECTOMY/DECOMPRESSION MICRODISCECTOMY Right 07/17/2013   Procedure: LUMBAR LAMINECTOMY/DECOMPRESSION MICRODISCECTOMY 1 LEVEL,RIGHT LUMBAR FOUR-FIVE;  Surgeon: Augustine Blocker, MD;  Location: [facility];  Service: Neurosurgery;  Laterality: Right;  Right   MASS EXCISION Left 07/02/2023   Procedure: EXCISION, MASS, CHEST WALL;  Surgeon: Lockie Rima, MD;  Location: [facility];  Service: General;  Laterality: Left;   PARTIAL HYSTERECTOMY     POLYPECTOMY  2017   TA and benign polypoid   POLYPECTOMY  06/01/2021   Procedure: POLYPECTOMY;  Surgeon: Nannette Babe, MD;  Location: [facility];  Service: Gastroenterology;;   WISDOM TOOTH EXTRACTION     Patient Active Problem List   Diagnosis Date Noted  Genetic testing 06/23/2023   Malignant neoplasm of upper-outer quadrant of left breast in female, estrogen receptor positive (HCC) 06/11/2023   Gastritis without bleeding    Esophageal dysphagia    Schatzki's ring    Benign neoplasm of ascending colon    Class 3 severe obesity with serious comorbidity and body mass index (BMI) of 40.0 to 44.9 in adult 08/25/2019   Type 2 diabetes mellitus without complication, without long-term current use of insulin (HCC) 08/25/2019   Chronic cough 06/27/2017   LUQ abdominal pain 12/07/2014   RUQ abdominal pain 11/10/2013   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   Gastroesophageal reflux disease 12/18/2012   Hx of adenomatous colonic  polyps 12/18/2012   Chronic RLQ pain 12/18/2012   IBS (irritable bowel syndrome) 01/22/2011   HTN (hypertension) 01/22/2011   Hyperlipidemia 01/22/2011   Anxiety and depression 01/22/2011    PCP: Winda Hastings, NP  REFERRING PROVIDER: Dr. Lockie Rima   REFERRING DIAG: Left breast cancer  THERAPY DIAG:  Stiffness of left shoulder, not elsewhere classified  Aftercare following surgery for neoplasm  Abnormal posture  Malignant neoplasm of upper-outer quadrant of left breast in female, estrogen receptor positive (HCC)  Rationale for Evaluation and Treatment: Rehabilitation  ONSET DATE: 04/26/23  SUBJECTIVE:                                                                                                                                                                                           SUBJECTIVE STATEMENT: My nerve pain was a little worse after last session.   PERTINENT HISTORY:  Patient was diagnosed on 04/26/2023 with left grade 3 invasive ductal carcinoma breast cancer. It measures 1 cm and is located in the upper outer quadrant. It is ER/PR positive and HER2 negative with a Ki67 of 30%. Pt underwent a L lumpectomy and SLNB 0/2 on 07/02/23  PATIENT GOALS:  Reassess how my recovery is going related to arm function, pain, and swelling.  PAIN:  Are you having pain? No, just discomfort from the nerve pain in my Lt axilla  PRECAUTIONS: Recent Surgery, left UE Lymphedema risk,   RED FLAGS: None   ACTIVITY LEVEL / LEISURE: doing post op exercises   OBJECTIVE:   PATIENT SURVEYS:  QUICK DASH:     OBSERVATIONS: Healing scars with some increased scar tissue at L axilla  POSTURE:  Forward head and rounded shoulders posture   UPPER EXTREMITY AROM/PROM:   A/PROM RIGHT   eval    Shoulder extension 48  Shoulder flexion 153  Shoulder abduction 165  Shoulder internal rotation 63  Shoulder external rotation 84                          (  Blank rows = not tested)    A/PROM LEFT   eval LEFT  07/25/23  Shoulder extension 35 70  Shoulder flexion 150 162  Shoulder abduction 167 156  Shoulder internal rotation 63 69  Shoulder external rotation 75 80                          (Blank rows = not tested)   CERVICAL AROM: All within normal limits   UPPER EXTREMITY STRENGTH: WFL   LYMPHEDEMA ASSESSMENTS (in cm):    LANDMARK RIGHT   eval  10 cm proximal to olecranon process 38.8  Olecranon process 28.7  10 cm proximal to ulnar styloid process 25.8  Just proximal to ulnar styloid process 17  Across hand at thumb web space 20.5  At base of 2nd digit 5.9  (Blank rows = not tested)   LANDMARK LEFT   eval LEFT 07/25/23  10 cm proximal to olecranon process 40 38.4  Olecranon process 28.8 29.5  10 cm proximal to ulnar styloid process 24.3 23  Just proximal to ulnar styloid process 16.3 16.5  Across hand at thumb web space 19.5 20  At base of 2nd digit 5.9 5.8  (Blank rows = not tested)  Surgery type/Date: 07/02/23 L breast lumpectomy and SLNB Number of lymph nodes removed: 0/2 Current/past treatment (chemo, radiation, hormone therapy): does not need chemo, will require radiation, will need hormone therapy Other symptoms:  Heaviness/tightness Yes Pain Yes Pitting edema No Infections No Decreased scar mobility Yes Stemmer sign No   TREATMENT PERFORMED: 07/29/23: Therapeutic Exercises Pulleys into flex and abd x 2 mins each with VC's to decrease scapular compensation and relax shoulders Roll yellow ball up wall into flex and Lt UE abd x 10 each Therapeutic Activities Supine over half foam roll for following: Bil UE horz abd x 10, bil UE scaption into a "V" x 10, and then bil UE abd into a "snow angel" Manual Therapy P/ROM to Lt shoulder into flex, abd, and D2 with scapular depression throughout by therapist Scar Tissue massage over axilla where palpable tightness over lymph node bed, began instructing pt in scar tissue massage but advised her  to ask Dr. Cherlynn Cornfield if ok to begin this when she sees her this week.  Cut and issued 1/4" gray foam in TG soft for pt to wear at bra band where rubbing incision.   07/25/23: Pulleys x 2 min in direction of abduction and in direction of flexion with pt returning therapist demo and feeling decreased discomfort in axilla by the end Instructed pt in supine dowel exercises as follows with pt returning therapist demo: L shoulder abduction x 5 reps with 3 sec holds, bilateral shoulder flexion x 10 reps with 3 sec holds  PATIENT EDUCATION:  Education details: nerve desensitization, supine dowel exercises, lymphedema risk reduction and importance of skin care Person educated: Patient Education method: Explanation, Demonstration, Tactile cues, and Handouts Education comprehension: verbalized understanding and returned demonstration  HOME EXERCISE PROGRAM: Reviewed previously given post op HEP. Supine dowel exercises in to abduction and flexion  ASSESSMENT:  CLINICAL IMPRESSION: Pt reports noting her nerve pain was increased after last session. Today continued with AA/ROM and progressed A/ROM stretches over half foam roll which she was challenged by. Then continued with manual therapy working to decrease Lt upper quadrant tightness. Pt reports her t axilla felt much improved after session.   Pt will benefit from skilled therapeutic intervention to improve on the following  deficits: Decreased knowledge of precautions, impaired UE functional use, pain, decreased ROM, postural dysfunction.   PT treatment/interventions: ADL/Self care home management, [phone]- PT Re-evaluation, 97110-Therapeutic exercises, 97530- Therapeutic activity, [id number]- Neuromuscular re-education, 97535- Self Care, 60454- Manual therapy, [id number]- Orthotic Initial, and [id number]- Orthotic/Prosthetic subsequent   GOALS: Goals reviewed with patient? Yes  LONG TERM GOALS:  (STG=LTG)  GOALS Name Target Date  Goal status  1 Pt will demonstrate  she has regained full shoulder ROM and function post operatively compared to baselines.  Baseline: 08/22/23 INITIAL  2 Pt will report no pain at end range of L shoulder abduction to allow improved function. 08/22/23 INITIAL  3 Pt will be independent in a home exercise program for continued stretching and strengthening.  08/22/23 INITIAL     PLAN:  PT FREQUENCY/DURATION: 2x/wk for 4 wks  PLAN FOR NEXT SESSION: pulleys, ball, PROM to L shoulder, MFR to possible cording in L axilla   [facility]  [address], [address]  [place] [address]  [phone]     Denyce Flank, PTA 07/29/2023, 3:03 PM

## 2023-07-30 ENCOUNTER — Telehealth: Payer: Self-pay

## 2023-07-30 NOTE — Progress Notes (Signed)
 Nursing interview for a diagnosis of Malignant neoplasm of upper-outer quadrant of left breast in female, estrogen receptor positive [facility])   Patient identity verified x2.   Intent: Curative Location: LT breast   Patient states issues as follows...   Pain: 2/10 LT axilla- occasional. ROM: Mildly limited LT but doing well. Lymphedema: Mild Cardiac issue: Denies Lung issue: Denise Appetite: Good   All pertinent body systems reviewed w/ patient. Patient denies any other related issues at this time.   Meaningful use complete.   Vitals- BP 135/82   Pulse 94   Temp 98.6 F (37 C) (Oral)   Resp 19   Ht 5\' 6"  (1.676 m)   Wt 250 lb (113.4 kg)   SpO2 97%   BMI 40.35 kg/m   SAFETY ISSUES: Prior radiation? NO Pacemaker/ICD? Denies Possible current pregnancy? Hysterectomy   Is the patient on methotrexate? Denies  This concludes the interaction.  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 Treatment contingent on Oncotype DX results. - Perform Oncotype DX testing post-surgery to assess tumor genetics and determine the need for chemotherapy. - If chemotherapy is needed, administer TC regimen (two chemo drugs every 21 days for four cycles). - If chemotherapy is not needed, proceed with radiation therapy. - Post-radiation, initiate tamoxifen therapy for five years if premenopausal. - Discuss potential side effects of tamoxifen, including menopausal symptoms such as hot flashes, vaginal discharge, mood swings, and sleep disturbances, DVT/PE, endometrial hyperplasia, endometrial carcinoma and benefit on bone density. - Consider cold cap therapy to mitigate hair loss if chemotherapy is required.   -US   LIMITED ULTRASOUND INCLUDING AXILLA LEFT BREAST 05/27/2023: IMPRESSION:   -Diagnostic Mammogram on 05/27/2023: IMPRESSION:     -Screening Mammogram on 04/26/2023: IMPRESSION:    Surgeon / surgical plan, if any:  Norma [name] -MD Left Breast Radioactive seed localized lumpectomy and sentinel lymph node biopsy, excision of 6 cm subcutaneous left chest wall mass  Histology per Pathology Report: left breast cancer (cm)   Pathology Results 07/02/2023:  FINAL MICROSCOPIC DIAGNOSIS:  A. LEFT BREAST, LUMPECTOMY: Invasive poorly differentiated ductal adenocarcinoma, grade 3 (3+3+3) Tumor measures 1.5 x 1.3 x 1.1 cm (pT1c) Margins free (invasive tumor 4 mm from inferior margin and 6 mm from superior margin) Negative for angiolymphatic invasion Prognostic markers (from report [id number]): Estrogen receptor positive, progesterone receptor positive, Ki-67 30% and HER2/neu oncoprotein expression negative (0) Changes consistent with prior biopsy (ribbon clip) Fibrocystic changes including stromal fibrosis and usual duct hyperplasia Focal secretory changes  B. LEFT CHEST WALL, SOFT TISSUE MASS, EXCISION: Mature adipose compatible with lipoma Negative for inflammation and atypia  C. LEFT AXILLARY SENTINEL LYMPH NODE, EXCISION: One benign lymph node, negative for carcinoma (0/1)  D. LEFT AXILLARY SENTINEL LYMPH NODE, EXCISION: One benign lymph node, negative for carcinoma (0/1)

## 2023-07-30 NOTE — Telephone Encounter (Signed)
 Called to confirm appointment and got a voicemail. Left a message to confirm

## 2023-07-31 ENCOUNTER — Inpatient Hospital Stay

## 2023-07-31 ENCOUNTER — Other Ambulatory Visit: Payer: Self-pay | Admitting: *Deleted

## 2023-07-31 ENCOUNTER — Ambulatory Visit
Admission: RE | Admit: 2023-07-31 | Discharge: 2023-07-31 | Disposition: A | Discharge status: Home / Self Care | Source: Ambulatory Visit | Attending: Radiation Oncology | Admitting: Radiation Oncology

## 2023-07-31 ENCOUNTER — Encounter: Payer: Self-pay | Admitting: Radiation Oncology

## 2023-07-31 ENCOUNTER — Inpatient Hospital Stay: Attending: Hematology and Oncology | Admitting: Hematology and Oncology

## 2023-07-31 VITALS — BP 135/82 | HR 94 | Temp 98.6°F | Resp 19 | Ht 66.0 in | Wt 250.0 lb

## 2023-07-31 DIAGNOSIS — Z1721 Progesterone receptor positive status: Secondary | ICD-10-CM | POA: Insufficient documentation

## 2023-07-31 DIAGNOSIS — Z510 Encounter for antineoplastic radiation therapy: Secondary | ICD-10-CM | POA: Diagnosis not present

## 2023-07-31 DIAGNOSIS — C50412 Malignant neoplasm of upper-outer quadrant of left female breast: Secondary | ICD-10-CM

## 2023-07-31 DIAGNOSIS — Z1732 Human epidermal growth factor receptor 2 negative status: Secondary | ICD-10-CM | POA: Insufficient documentation

## 2023-07-31 DIAGNOSIS — Z79899 Other long term (current) drug therapy: Secondary | ICD-10-CM | POA: Insufficient documentation

## 2023-07-31 DIAGNOSIS — Z170 Estrogen receptor positive status [ER+]: Secondary | ICD-10-CM | POA: Insufficient documentation

## 2023-07-31 DIAGNOSIS — N951 Menopausal and female climacteric states: Secondary | ICD-10-CM | POA: Diagnosis not present

## 2023-07-31 NOTE — Progress Notes (Signed)
 Norma [name]  Patient Care Team: Collins Dean, NP as PCP - General (Nurse Practitioner) Diamond Formica, MD as Consulting Physician (Pulmonary Disease) [facility] Alane Hsu, RN as Oncology Nurse Navigator Auther Bo, RN as Oncology Nurse Navigator Lockie Rima, MD as Consulting Physician (General Surgery) Murleen Arms, MD as Consulting Physician (Hematology and Oncology) Johna Myers, MD as Consulting Physician (Radiation Oncology)  CHIEF COMPLAINTS/PURPOSE OF CONSULTATION:  New diagnosis of left breast IDC.  ASSESSMENT & PLAN:  No problem-specific Assessment & Plan notes found for this encounter.  Assessment and Plan Assessment & Plan Breast cancer Undergoing radiation therapy post-chemotherapy. Tamoxifen planned as adjuvant therapy. Discussed potential side effects and benefits of tamoxifen. Consideration of alternative medications if postmenopausal. - Complete radiation therapy by September 04, 2023. - Initiate tamoxifen therapy one month post-radiation. - Consider alternative such as anastrozole, letrozole and exemestane if postmenopausal status is confirmed.  Surgical menopause Hysterectomy in 2008 resulted in surgical menopause.  Persistent menopausal symptoms noted. Estradiol levels may help with menopausal status.    Orders Placed This Encounter  Procedures   Estradiol    Standing Status:   Future    Number of Occurrences:   1    Expiration Date:   07/30/2024   LH-Luteinizing hormone    Standing Status:   Future    Number of Occurrences:   1    Expiration Date:   07/30/2024   FSH-Follicle stimulating hormone    Standing Status:   Future    Number of Occurrences:   1    Expiration Date:   07/30/2024     HISTORY OF PRESENTING ILLNESS:  Norma [name] 53 y.o. female is here because of left breast IDC  Oncology History  Malignant neoplasm of upper-outer quadrant of left breast in female, estrogen  receptor positive (HCC)  04/25/2023 Mammogram   Mass in the left breast central to nipple anterior depth. Additional imaging showed 1 * 0.8*0.7 cm irregular enlarging mass in the left breast at 3 0 clock anterior depth suspicious of malignancy. US  guided biopsy is recommended.   06/04/2023 Pathology Results   Left breast needle core biopsy showed grade 3 IDC, ER 100% positive, PR 70% strong staining,  Her 2 0, Ki 67 30%   06/11/2023 Initial Diagnosis   Malignant neoplasm of upper-outer quadrant of left breast in female, estrogen receptor positive (HCC)   06/21/2023 Genetic Testing   Negative genetic testing on the CancerNext-Expanded+RNAinsight panel.  The report date is June 21, 2023.  The CancerNext-Expanded gene panel offered by [facility] and includes sequencing, rearrangement, and RNA analysis for the following 76 genes: AIP, ALK, APC, ATM, AXIN2, BAP1, BARD1, BMPR1A, BRCA1, BRCA2, BRIP1, CDC73, CDH1, CDK4, CDKN1B, CDKN2A, CEBPA, CHEK2, CTNNA1, DDX41, DICER1, ETV6, FH, FLCN, GATA2, LZTR1, MAX, MBD4, MEN1, MET, MLH1, MSH2, MSH3, MSH6, MUTYH, NF1, NF2, NTHL1, PALB2, PHOX2B, PMS2, POT1, PRKAR1A, PTCH1, PTEN, RAD51C, RAD51D, RB1, RET, RUNX1, SDHA, SDHAF2, SDHB, SDHC, SDHD, SMAD4, SMARCA4, SMARCB1, SMARCE1, STK11, SUFU, TMEM127, TP53, TSC1, TSC2, VHL, and WT1 (sequencing and deletion/duplication); EGFR, HOXB13, KIT, MITF, PDGFRA, POLD1, and POLE (sequencing only); EPCAM and GREM1 (deletion/duplication only).      Discussed the use of AI scribe software for clinical [name] transcription with the patient, who gave verbal consent to proceed.  History of Present Illness  Norma [name] is a 53 year old female with breast cancer who presents for follow-up regarding her treatment plan.  She is currently undergoing  treatment for breast cancer, having completed chemotherapy and now preparing for radiation therapy. She feels relieved about not needing further chemotherapy and is concerned about  potential side effects of radiation, such as fatigue and skin changes.  She is scheduled to start tamoxifen after completing radiation therapy and is inquiring about its side effects, which include hot flashes, mood swings, and a small risk of blood clots.  She underwent a surgical hysterectomy in 2008 and has been experiencing menopausal symptoms such as hot flashes, mood swings, and sleepless nights for the past few years. She is interested in determining her menopausal status and is considering blood tests to measure estradiol levels to assess this.  She is currently under Dr Cherlynn Cornfield care for another two weeks and plans to return to work part-time next week. She is seeking assistance with paperwork related to her medical leave and is inquiring about specific nurses who can help with this process.  All other systems were reviewed with the patient and are negative.  MEDICAL HISTORY:  Past Medical History:  Diagnosis Date   Allergy    Anemia    hx of   Anxiety    Arthritis    KNEES   Asthma    treated for during COVID tx   Cancer (HCC) 06/2023   left breast IDC   Chronic headache    Colon polyps    TUBULAR ADENOMAS AND HYPERPLASTIC    Complication of anesthesia    DDD (degenerative disc disease), thoracic    Depression    Diabetes mellitus, type 2 (HCC)    Difficult airway for intubation    Fibromyalgia    GERD (gastroesophageal reflux disease)    Goiter    Hyperlipidemia    Hypertension    Low back pain    Obesity    PONV (postoperative nausea and vomiting)    RA (rheumatoid arthritis) (HCC)    Seasonal allergies    Vitamin D  deficiency     SURGICAL HISTORY: Past Surgical History:  Procedure Laterality Date   BACK SURGERY  2005   lumbar laminectomy   BALLOON DILATION N/A 06/01/2021   Procedure: BALLOON DILATION;  Surgeon: Nannette Babe, MD;  Location: [facility];  Service: Gastroenterology;  Laterality: N/A;   BARTHOLIN GLAND CYST EXCISION     BIOPSY  06/01/2021    Procedure: BIOPSY;  Surgeon: Nannette Babe, MD;  Location: [facility];  Service: Gastroenterology;;   BREAST LUMPECTOMY WITH RADIOACTIVE SEED AND SENTINEL LYMPH NODE BIOPSY Left 07/02/2023   Procedure: BREAST LUMPECTOMY WITH RADIOACTIVE SEED AND SENTINEL LYMPH NODE BIOPSY;  Surgeon: Lockie Rima, MD;  Location: [facility];  Service: General;  Laterality: Left;  90 MIN 01027- EXCISION LEFT CHEST WALL MASS GEN COMBINED WITH REGIONAL   COLONOSCOPY  2017   JMP-MAC-prep good-TA -recall 5 yr   COLONOSCOPY WITH PROPOFOL  N/A 06/01/2021   Procedure: COLONOSCOPY WITH PROPOFOL ;  Surgeon: Nannette Babe, MD;  Location: [facility];  Service: Gastroenterology;  Laterality: N/A;   ESOPHAGOGASTRODUODENOSCOPY (EGD) WITH PROPOFOL  N/A 06/01/2021   Procedure: ESOPHAGOGASTRODUODENOSCOPY (EGD) WITH PROPOFOL ;  Surgeon: Nannette Babe, MD;  Location: [facility];  Service: Gastroenterology;  Laterality: N/A;   LUMBAR LAMINECTOMY/DECOMPRESSION MICRODISCECTOMY Right 07/17/2013   Procedure: LUMBAR LAMINECTOMY/DECOMPRESSION MICRODISCECTOMY 1 LEVEL,RIGHT LUMBAR FOUR-FIVE;  Surgeon: Augustine Blocker, MD;  Location: [facility];  Service: Neurosurgery;  Laterality: Right;  Right   MASS EXCISION Left 07/02/2023   Procedure: EXCISION, MASS, CHEST WALL;  Surgeon: Lockie Rima, MD;  Location:  [facility];  Service: General;  Laterality: Left;   PARTIAL HYSTERECTOMY     POLYPECTOMY  2017   TA and benign polypoid   POLYPECTOMY  06/01/2021   Procedure: POLYPECTOMY;  Surgeon: Nannette Babe, MD;  Location: [facility];  Service: Gastroenterology;;   WISDOM TOOTH EXTRACTION      SOCIAL HISTORY: Social History   Socioeconomic History   Marital status: Single    Spouse name: Not on file   Number of children: 1   Years of education: Not on file   Highest education level: Associate degree: academic program  Occupational History   Occupation: Tax adviser: [employer]  Tobacco Use    Smoking status: Never   Smokeless tobacco: Never  Vaping Use   Vaping status: Never Used  Substance and Sexual Activity   Alcohol use: Yes    Comment: occ   Drug use: Never   Sexual activity: Not Currently    Birth control/protection: Surgical    Comment: hyst  Other Topics Concern   Not on file  Social History Narrative   Lives at home. Her son lives with her.    Right handed   Caffeine: few times a year   Social Drivers of Corporate investment banker Strain: Low Risk  (08/30/2022)   Overall Financial Resource Strain (CARDIA)    Difficulty of Paying Living Expenses: Not hard at all  Food Insecurity: No Food Insecurity (07/31/2023)   Hunger Vital Sign    Worried About Running Out of Food in the Last Year: Never true    Ran Out of Food in the Last Year: Never true  Transportation Needs: No Transportation Needs (07/31/2023)   PRAPARE - Administrator, Civil Service (Medical): No    Lack of Transportation (Non-Medical): No  Physical Activity: Unknown (08/30/2022)   Exercise Vital Sign    Days of Exercise per Week: 0 days    Minutes of Exercise per Session: Not on file  Stress: Stress Concern Present (08/30/2022)   [employer] of Occupational Health - Occupational Stress Questionnaire    Feeling of Stress : Very much  Social Connections: Moderately Isolated (08/30/2022)   Social Connection and Isolation Panel [NHANES]    Frequency of Communication with Friends and Family: More than three times a week    Frequency of Social Gatherings with Friends and Family: Once a week    Attends Religious Services: 1 to 4 times per year    Active Member of [employer] or Organizations: No    Attends Engineer, structural: Not on file    Marital Status: Never married  Intimate Partner Violence: Not At Risk (07/31/2023)   Humiliation, Afraid, Rape, and Kick questionnaire    Fear of Current or Ex-Partner: No    Emotionally Abused: No    Physically Abused: No    Sexually  Abused: No    FAMILY HISTORY: Family History  Problem Relation Age of Onset   Diabetes Mother    Aneurysm Mother    Diabetes Father    Colon polyps Maternal Uncle    Liver disease Maternal Grandmother    Rheum arthritis Maternal Grandmother    Stroke Maternal Grandfather    Parkinson's disease Paternal Grandmother    Esophageal cancer Neg Hx    Colon cancer Neg Hx    Rectal cancer Neg Hx    Stomach cancer Neg Hx     ALLERGIES:  is allergic to empagliflozin, metformin, semaglutide, and  tizanidine.  MEDICATIONS:  Current Outpatient Medications  Medication Sig Dispense Refill   aspirin 81 MG chewable tablet Chew by mouth daily.     Azelastine -Fluticasone  137-50 MCG/ACT SUSP Place 1 spray into the nose every 12 (twelve) hours. 23 g 3   ELDERBERRY PO Take 1 tablet by mouth daily. As needed     ezetimibe (ZETIA) 10 MG tablet TAKE 1 TABLET BY MOUTH EVERY DAY 90 tablet 1   glimepiride  (AMARYL ) 1 MG tablet TAKE 1 TABLET (1 MG TOTAL) BY MOUTH DAILY WITH BREAKFAST. MUST HAVE OFFICE VISIT FOR REFILLS 90 tablet 0   pantoprazole  (PROTONIX ) 20 MG tablet TAKE 1 TABLET BY MOUTH TWICE A DAY 180 tablet 1   polyethylene glycol powder (GLYCOLAX /MIRALAX ) powder Dissolve 17 grams in at least 8 ounces water /juice and drink once daily. 527 g 2   Probiotic Product (PROBIOTIC DAILY PO) Take 1 tablet by mouth daily. [facility]     telmisartan  (MICARDIS ) 80 MG tablet Take 1 tablet (80 mg total) by mouth daily. Please schedule appointment with Zelda for more refills. 30 tablet 0   valACYclovir (VALTREX) 500 MG tablet Take 1 tablet by mouth daily as needed (Flair up). As needed     No current facility-administered medications for this visit.     PHYSICAL EXAMINATION: ECOG PERFORMANCE STATUS: 0 - Asymptomatic  There were no vitals filed for this visit.  There were no vitals filed for this visit.   GENERAL:alert, no distress and comfortable SKIN: skin color, texture, turgor are normal, no rashes or  significant lesions EYES: normal, conjunctiva are pink and non-injected, sclera clear OROPHARYNX:no exudate, no erythema and lips, buccal mucosa, and tongue normal  NECK: supple, thyroid normal size, non-tender, without nodularity LYMPH:  no palpable lymphadenopathy in the cervical, axillary or inguinal LUNGS: clear to auscultation and percussion with normal breathing effort HEART: regular rate & rhythm and no murmurs and no lower extremity edema ABDOMEN:abdomen soft, non-tender and normal bowel sounds Musculoskeletal:no cyanosis of digits and no clubbing  PSYCH: alert & oriented x 3 with fluent speech NEURO: no focal motor/sensory deficits  LABORATORY DATA:  I have reviewed the data as listed Lab Results  Component Value Date   WBC 5.5 06/12/2023   HGB 12.3 06/12/2023   HCT 38.1 06/12/2023   MCV 85.6 06/12/2023   PLT 205 06/12/2023     Chemistry      Component Value Date/Time   NA 139 06/12/2023 1157   NA 140 04/04/2023 0839   K 4.1 06/12/2023 1157   CL 104 06/12/2023 1157   CO2 30 06/12/2023 1157   BUN 14 06/12/2023 1157   BUN 18 04/04/2023 0839   CREATININE 0.86 06/12/2023 1157      Component Value Date/Time   CALCIUM 9.4 06/12/2023 1157   ALKPHOS 63 06/12/2023 1157   AST 23 06/12/2023 1157   ALT 30 06/12/2023 1157   BILITOT 0.4 06/12/2023 1157       RADIOGRAPHIC STUDIES: I have personally reviewed the radiological images as listed and agreed with the findings in the report. No results found.  All questions were answered. The patient knows to call the clinic with any problems, questions or concerns. I spent 30 minutes in the care of this patient including H and P, review of records, counseling and coordination of care.     Murleen Arms, MD 08/01/2023 8:11 AM

## 2023-07-31 NOTE — Progress Notes (Signed)
 Radiation Oncology         [phone] ________________________________  Name: Norma [name]        MRN: [id number]  Date of Service: 07/31/2023 DOB: 19-Apr-1970  WU:[id number], Maile Score, NP  Murleen Arms, MD     REFERRING PHYSICIAN: Murleen Arms, MD   DIAGNOSIS: The encounter diagnosis was Malignant neoplasm of upper-outer quadrant of left breast in female, estrogen receptor positive (HCC).     HISTORY OF PRESENT ILLNESS: Norma [name] is a 53 y.o. female originally seen in the multidisciplinary breast clinic for a new diagnosis of left breast cancer. The patient was noted to have an abnormality on screening mammogram. She proceeded with diagnostic mammogram and ultrasound on 05/27/2023 that showed a 1.0 cm mass in the 3:00 position of the left breast with an equivocal left axillary lymph node. 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 A biopsy of the left axilla was also negative for malignancy.   Since her last visit the patient underwent a left lumpectomy with sentinel node biopsy and resection of a soft tissue mass along the left chest wall on 07/02/23 that revealed a grade 3 invasive ductal carcinoma of the lumpectomy specimen measuring 1.5 cm with negative margins, and two sentinel nodes were negative for disease. The chest wall soft tissue showed mature adipose compatible with lipoma. Oncotype Dx score was performed on her surgical specimen and showed a recurrence risk score of 22, and confirmed the tumor was ER positive, PR negative, and HER2 negative. Based on her Oncotype Dx score, Dr. Arno Bibles does not recommend chemotherapy. She's seen today to discuss adjuvant radiotherapy.  PREVIOUS RADIATION THERAPY: No   PAST MEDICAL HISTORY:  Past Medical History:  Diagnosis Date   Allergy    Anemia    hx of   Anxiety    Arthritis    KNEES   Asthma    treated for during COVID tx   Cancer (HCC)  06/2023   left breast IDC   Chronic headache    Colon polyps    TUBULAR ADENOMAS AND HYPERPLASTIC    Complication of anesthesia    DDD (degenerative disc disease), thoracic    Depression    Diabetes mellitus, type 2 (HCC)    Difficult airway for intubation    Fibromyalgia    GERD (gastroesophageal reflux disease)    Goiter    Hyperlipidemia    Hypertension    Low back pain    Obesity    PONV (postoperative nausea and vomiting)    RA (rheumatoid arthritis) (HCC)    Seasonal allergies    Vitamin D  deficiency        PAST SURGICAL HISTORY: Past Surgical History:  Procedure Laterality Date   BACK SURGERY  2005   lumbar laminectomy   BALLOON DILATION N/A 06/01/2021   Procedure: BALLOON DILATION;  Surgeon: Nannette Babe, MD;  Location: [facility];  Service: Gastroenterology;  Laterality: N/A;   BARTHOLIN GLAND CYST EXCISION     BIOPSY  06/01/2021   Procedure: BIOPSY;  Surgeon: Nannette Babe, MD;  Location: [facility];  Service: Gastroenterology;;   BREAST LUMPECTOMY WITH RADIOACTIVE SEED AND SENTINEL LYMPH NODE BIOPSY Left 07/02/2023   Procedure: BREAST LUMPECTOMY WITH RADIOACTIVE SEED AND SENTINEL LYMPH NODE BIOPSY;  Surgeon: Lockie Rima, MD;  Location: [facility];  Service: General;  Laterality: Left;  90 MIN 78295- EXCISION LEFT CHEST WALL  MASS GEN COMBINED WITH REGIONAL   COLONOSCOPY  2017   JMP-MAC-prep good-TA -recall 5 yr   COLONOSCOPY WITH PROPOFOL  N/A 06/01/2021   Procedure: COLONOSCOPY WITH PROPOFOL ;  Surgeon: Nannette Babe, MD;  Location: [facility];  Service: Gastroenterology;  Laterality: N/A;   ESOPHAGOGASTRODUODENOSCOPY (EGD) WITH PROPOFOL  N/A 06/01/2021   Procedure: ESOPHAGOGASTRODUODENOSCOPY (EGD) WITH PROPOFOL ;  Surgeon: Nannette Babe, MD;  [facility]: [facility];  Service: Gastroenterology;  Laterality: N/A;   LUMBAR LAMINECTOMY/DECOMPRESSION MICRODISCECTOMY Right 07/17/2013   Procedure: LUMBAR LAMINECTOMY/DECOMPRESSION MICRODISCECTOMY 1  LEVEL,RIGHT LUMBAR FOUR-FIVE;  Surgeon: Augustine Blocker, MD;  Location: [facility];  Service: Neurosurgery;  Laterality: Right;  Right   MASS EXCISION Left 07/02/2023   Procedure: EXCISION, MASS, CHEST WALL;  Surgeon: Lockie Rima, MD;  [facility];  Service: General;  Laterality: Left;   PARTIAL HYSTERECTOMY     POLYPECTOMY  2017   TA and benign polypoid   POLYPECTOMY  06/01/2021   Procedure: POLYPECTOMY;  Surgeon: Nannette Babe, MD;  Location: [facility];  Service: Gastroenterology;;   WISDOM TOOTH EXTRACTION       FAMILY HISTORY:  Family History  Problem Relation Age of Onset   Diabetes Mother    Aneurysm Mother    Diabetes Father    Colon polyps Maternal Uncle    Liver disease Maternal Grandmother    Rheum arthritis Maternal Grandmother    Stroke Maternal Grandfather    Parkinson's disease Paternal Grandmother    Esophageal cancer Neg Hx    Colon cancer Neg Hx    Rectal cancer Neg Hx    Stomach cancer Neg Hx      SOCIAL HISTORY:  reports that she has never smoked. She has never used smokeless tobacco. She reports current alcohol use. She reports that she does not use drugs. The patient is single and lives in [place]. 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 She is able to work remotely and is considering taking time during radiation.    ALLERGIES: Empagliflozin, Metformin, Semaglutide, and Tizanidine   MEDICATIONS:  Current Outpatient Medications  Medication Sig Dispense Refill   aspirin 81 MG chewable tablet Chew by mouth daily.     Azelastine -Fluticasone  137-50 MCG/ACT SUSP Place 1 spray into the nose every 12 (twelve) hours. 23 g 3   cetirizine  (ZYRTEC ) 10 MG tablet Take 10 mg by mouth daily.     ELDERBERRY PO Take 1 tablet by mouth daily. As needed     ezetimibe (ZETIA) 10 MG tablet TAKE 1 TABLET BY MOUTH EVERY DAY 90 tablet 1   glimepiride  (AMARYL ) 1 MG tablet TAKE 1 TABLET (1 MG TOTAL) BY MOUTH DAILY WITH BREAKFAST.  MUST HAVE OFFICE VISIT FOR REFILLS 90 tablet 0   oxyCODONE  (OXY IR/ROXICODONE ) 5 MG immediate release tablet Take 1 tablet (5 mg total) by mouth every 6 (six) hours as needed for severe pain (pain score 7-10). 5 tablet 0   pantoprazole  (PROTONIX ) 20 MG tablet TAKE 1 TABLET BY MOUTH TWICE A DAY 180 tablet 1   polyethylene glycol powder (GLYCOLAX /MIRALAX ) powder Dissolve 17 grams in at least 8 ounces water /juice and drink once daily. 527 g 2   Probiotic Product (PROBIOTIC DAILY PO) Take 1 tablet by mouth daily. [facility]     telmisartan  (MICARDIS ) 80 MG tablet Take 1 tablet (80 mg total) by mouth daily. Please schedule appointment with Zelda for more refills. 30 tablet 0   valACYclovir (VALTREX) 500 MG tablet Take 1  tablet by mouth daily as needed (Flair up). As needed     No current facility-administered medications for this encounter.     REVIEW OF SYSTEMS: On review of systems, the patient reports that she is doing well overall. She is working with PT for range of motion. She has some itching at the site of her lipoma excision. No other complaints are verbalized.      PHYSICAL EXAM:  Wt Readings from Last 3 Encounters:  07/02/23 250 lb 3.6 oz (113.5 kg)  06/12/23 259 lb (117.5 kg)  06/06/23 250 lb (113.4 kg)   Temp Readings from Last 3 Encounters:  07/02/23 (!) 97.1 F (36.2 C)  06/12/23 97.7 F (36.5 C) (Temporal)  05/24/23 97.8 F (36.6 C) (Oral)   BP Readings from Last 3 Encounters:  07/02/23 (!) 140/74  06/12/23 (!) 154/81  06/06/23 (!) 150/93   Pulse Readings from Last 3 Encounters:  07/02/23 93  06/12/23 (!) 101  06/06/23 88    In general this is a well appearing African American female in no acute distress. She's alert and oriented x4 and appropriate throughout the examination. Cardiopulmonary assessment is negative for acute distress and she exhibits normal effort. The left breast reveals a well healed incision without erythema, separation or drainage. Her lipoma  scar is also healing well with less erythematous papules than she noted prior to seeing Dr. Cherlynn Cornfield yesterday.   ECOG = 0  0 - Asymptomatic (Fully active, able to carry on all predisease activities without restriction)  1 - Symptomatic but completely ambulatory (Restricted in physically strenuous activity but ambulatory and able to carry out work of a light or sedentary nature. For example, light housework, office work)  2 - Symptomatic, <50% in bed during the day (Ambulatory and capable of all self care but unable to carry out any work activities. Up and about more than 50% of waking hours)  3 - Symptomatic, >50% in bed, but not bedbound (Capable of only limited self-care, confined to bed or chair 50% or more of waking hours)  4 - Bedbound (Completely disabled. Cannot carry on any self-care. Totally confined to bed or chair)  5 - Death   Aurea Blossom MM, Creech RH, Tormey DC, et al. [phone]). "Toxicity and response criteria of the [facility]". Am. Hillard Lowes. Oncol. 5 (6): 649-55    LABORATORY DATA:  Lab Results  Component Value Date   WBC 5.5 06/12/2023   HGB 12.3 06/12/2023   HCT 38.1 06/12/2023   MCV 85.6 06/12/2023   PLT 205 06/12/2023   Lab Results  Component Value Date   NA 139 06/12/2023   K 4.1 06/12/2023   CL 104 06/12/2023   CO2 30 06/12/2023   Lab Results  Component Value Date   ALT 30 06/12/2023   AST 23 06/12/2023   ALKPHOS 63 06/12/2023   BILITOT 0.4 06/12/2023      RADIOGRAPHY: No results found.     IMPRESSION/PLAN: 1.  Stage IA, pT1cN0M0, grade 3, ER positive invasive ductal carcinoma of the left breast. Dr. Jeryl Moris has reviewed her final pathology results. We discussed the nature of early stage breast cancer. She has done well since surgery and based on her Oncotype Dx results, Dr. Arno Bibles does not recommend chemotherapy. Dr. Jeryl Moris does recommend radiotherapy and we discussed the rationale to reduce risks of local breast recurrence. Dr. Arno Bibles  anticipates adjuvant antiestrogen therapy to follow. We discussed the risks, benefits, short, and long term effects of radiotherapy, as well  as the curative intent, and the patient is interested in proceeding. I reviewed the delivery and logistics of radiotherapy and that Dr. Jeryl Moris recommends 4 weeks of radiotherapy to the left breast with deep inspiration breath hold technique. Written consent is obtained and placed in the chart, a copy was provided to the patient. She will simulate today and see Dr. Arno Bibles as well.   In a visit lasting 45 minutes, greater than 50% of the time was spent face to face discussing the patient's condition, in preparation for the discussion, and coordinating the patient's care.      Shelvia Dick, [facility]   **Disclaimer: This note was dictated with voice recognition software. Similar sounding words can inadvertently be transcribed and this note may contain transcription errors which may not have been corrected upon publication of note.**

## 2023-08-01 ENCOUNTER — Other Ambulatory Visit: Payer: Self-pay

## 2023-08-01 ENCOUNTER — Encounter: Payer: Self-pay | Admitting: Physical Therapy

## 2023-08-01 ENCOUNTER — Telehealth: Payer: Self-pay | Admitting: Nurse Practitioner

## 2023-08-01 ENCOUNTER — Other Ambulatory Visit: Payer: Self-pay | Admitting: Nurse Practitioner

## 2023-08-01 ENCOUNTER — Ambulatory Visit: Admitting: Physical Therapy

## 2023-08-01 DIAGNOSIS — M25612 Stiffness of left shoulder, not elsewhere classified: Secondary | ICD-10-CM | POA: Diagnosis not present

## 2023-08-01 DIAGNOSIS — Z483 Aftercare following surgery for neoplasm: Secondary | ICD-10-CM

## 2023-08-01 DIAGNOSIS — R293 Abnormal posture: Secondary | ICD-10-CM

## 2023-08-01 DIAGNOSIS — C50412 Malignant neoplasm of upper-outer quadrant of left female breast: Secondary | ICD-10-CM

## 2023-08-01 LAB — FOLLICLE STIMULATING HORMONE: FSH: 17.7 m[IU]/mL

## 2023-08-01 LAB — ESTRADIOL: Estradiol: 7.5 pg/mL

## 2023-08-01 LAB — LUTEINIZING HORMONE: LH: 13.7 m[IU]/mL

## 2023-08-01 MED ORDER — TELMISARTAN 80 MG PO TABS: 80.0000 mg | ORAL_TABLET | Freq: Every day | ORAL | 0 refills | Status: DC

## 2023-08-01 NOTE — Telephone Encounter (Signed)
 Spoke to the patient. Verified name & DOB. Patient was frustrated and stated she needs a refill for her medication telmisartan . Patient requested an earlier appointment. I offered the option for the mobile clinic but the patient said that's too far. Please Advise.

## 2023-08-01 NOTE — Therapy (Signed)
 OUTPATIENT PHYSICAL THERAPY BREAST CANCER TREATMENT   Patient Name: Norma [name] MRN: [id number] DOB:1971-02-21, 53 y.o., female Today's Date: 08/01/2023  END OF SESSION:  PT End of Session - 08/01/23 1501     Visit Number 4    Number of Visits 10    Date for PT Re-Evaluation 08/22/23    PT Start Time 1500    PT Stop Time 1555    PT Time Calculation (min) 55 min    Activity Tolerance Patient tolerated treatment well    Behavior During Therapy [facility] for tasks assessed/performed             Past Medical History:  Diagnosis Date   Allergy    Anemia    hx of   Anxiety    Arthritis    KNEES   Asthma    treated for during COVID tx   Cancer (HCC) 06/2023   left breast IDC   Chronic headache    Colon polyps    TUBULAR ADENOMAS AND HYPERPLASTIC    Complication of anesthesia    DDD (degenerative disc disease), thoracic    Depression    Diabetes mellitus, type 2 (HCC)    Difficult airway for intubation    Fibromyalgia    GERD (gastroesophageal reflux disease)    Goiter    Hyperlipidemia    Hypertension    Low back pain    Obesity    PONV (postoperative nausea and vomiting)    RA (rheumatoid arthritis) (HCC)    Seasonal allergies    Vitamin D  deficiency    Past Surgical History:  Procedure Laterality Date   BACK SURGERY  2005   lumbar laminectomy   BALLOON DILATION N/A 06/01/2021   Procedure: BALLOON DILATION;  Surgeon: Nannette Babe, MD;  Location: [facility];  Service: Gastroenterology;  Laterality: N/A;   BARTHOLIN GLAND CYST EXCISION     BIOPSY  06/01/2021   Procedure: BIOPSY;  Surgeon: Nannette Babe, MD;  Location: [facility];  Service: Gastroenterology;;   BREAST LUMPECTOMY WITH RADIOACTIVE SEED AND SENTINEL LYMPH NODE BIOPSY Left 07/02/2023   Procedure: BREAST LUMPECTOMY WITH RADIOACTIVE SEED AND SENTINEL LYMPH NODE BIOPSY;  Surgeon: Lockie Rima, MD;  Location: [facility];  Service: General;  Laterality: Left;  90 MIN 86578- EXCISION  LEFT CHEST WALL MASS GEN COMBINED WITH REGIONAL   COLONOSCOPY  2017   JMP-MAC-prep good-TA -recall 5 yr   COLONOSCOPY WITH PROPOFOL  N/A 06/01/2021   Procedure: COLONOSCOPY WITH PROPOFOL ;  Surgeon: Nannette Babe, MD;  Location: [facility];  Service: Gastroenterology;  Laterality: N/A;   ESOPHAGOGASTRODUODENOSCOPY (EGD) WITH PROPOFOL  N/A 06/01/2021   Procedure: ESOPHAGOGASTRODUODENOSCOPY (EGD) WITH PROPOFOL ;  Surgeon: Nannette Babe, MD;  Location: [facility];  Service: Gastroenterology;  Laterality: N/A;   LUMBAR LAMINECTOMY/DECOMPRESSION MICRODISCECTOMY Right 07/17/2013   Procedure: LUMBAR LAMINECTOMY/DECOMPRESSION MICRODISCECTOMY 1 LEVEL,RIGHT LUMBAR FOUR-FIVE;  Surgeon: Augustine Blocker, MD;  Location: [facility];  Service: Neurosurgery;  Laterality: Right;  Right   MASS EXCISION Left 07/02/2023   Procedure: EXCISION, MASS, CHEST WALL;  Surgeon: Lockie Rima, MD;  Location: [facility];  Service: General;  Laterality: Left;   PARTIAL HYSTERECTOMY     POLYPECTOMY  2017   TA and benign polypoid   POLYPECTOMY  06/01/2021   Procedure: POLYPECTOMY;  Surgeon: Nannette Babe, MD;  Location: [facility];  Service: Gastroenterology;;   WISDOM TOOTH EXTRACTION     Patient Active Problem List   Diagnosis Date Noted  Genetic testing 06/23/2023   Malignant neoplasm of upper-outer quadrant of left breast in female, estrogen receptor positive (HCC) 06/11/2023   Gastritis without bleeding    Esophageal dysphagia    Schatzki's ring    Benign neoplasm of ascending colon    Class 3 severe obesity with serious comorbidity and body mass index (BMI) of 40.0 to 44.9 in adult 08/25/2019   Type 2 diabetes mellitus without complication, without long-term current use of insulin (HCC) 08/25/2019   Chronic cough 06/27/2017   LUQ abdominal pain 12/07/2014   RUQ abdominal pain 11/10/2013   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   Gastroesophageal reflux disease 12/18/2012   Hx of adenomatous colonic  polyps 12/18/2012   Chronic RLQ pain 12/18/2012   IBS (irritable bowel syndrome) 01/22/2011   HTN (hypertension) 01/22/2011   Hyperlipidemia 01/22/2011   Anxiety and depression 01/22/2011    PCP: Winda Hastings, NP  REFERRING PROVIDER: Dr. Lockie Rima   REFERRING DIAG: Left breast cancer  THERAPY DIAG:  Stiffness of left shoulder, not elsewhere classified  Aftercare following surgery for neoplasm  Abnormal posture  Malignant neoplasm of upper-outer quadrant of left breast in female, estrogen receptor positive (HCC)  Rationale for Evaluation and Treatment: Rehabilitation  ONSET DATE: 04/26/23  SUBJECTIVE:                                                                                                                                                                                           SUBJECTIVE STATEMENT: My arm was painful after having it back for the simulation. I had a good session last time.   PERTINENT HISTORY:  Patient was diagnosed on 04/26/2023 with left grade 3 invasive ductal carcinoma breast cancer. It measures 1 cm and is located in the upper outer quadrant. It is ER/PR positive and HER2 negative with a Ki67 of 30%. Pt underwent a L lumpectomy and SLNB 0/2 on 07/02/23  PATIENT GOALS:  Reassess how my recovery is going related to arm function, pain, and swelling.  PAIN:  Are you having pain? none  PRECAUTIONS: Recent Surgery, left UE Lymphedema risk,   RED FLAGS: None   ACTIVITY LEVEL / LEISURE: doing post op exercises   OBJECTIVE:   PATIENT SURVEYS:  QUICK DASH:     OBSERVATIONS: Healing scars with some increased scar tissue at L axilla  POSTURE:  Forward head and rounded shoulders posture   UPPER EXTREMITY AROM/PROM:   A/PROM RIGHT   eval    Shoulder extension 48  Shoulder flexion 153  Shoulder abduction 165  Shoulder internal rotation 63  Shoulder external rotation 84                          (  Blank rows = not tested)   A/PROM  LEFT   eval LEFT  07/25/23  Shoulder extension 35 70  Shoulder flexion 150 162  Shoulder abduction 167 156  Shoulder internal rotation 63 69  Shoulder external rotation 75 80                          (Blank rows = not tested)   CERVICAL AROM: All within normal limits   UPPER EXTREMITY STRENGTH: WFL   LYMPHEDEMA ASSESSMENTS (in cm):    LANDMARK RIGHT   eval  10 cm proximal to olecranon process 38.8  Olecranon process 28.7  10 cm proximal to ulnar styloid process 25.8  Just proximal to ulnar styloid process 17  Across hand at thumb web space 20.5  At base of 2nd digit 5.9  (Blank rows = not tested)   LANDMARK LEFT   eval LEFT 07/25/23  10 cm proximal to olecranon process 40 38.4  Olecranon process 28.8 29.5  10 cm proximal to ulnar styloid process 24.3 23  Just proximal to ulnar styloid process 16.3 16.5  Across hand at thumb web space 19.5 20  At base of 2nd digit 5.9 5.8  (Blank rows = not tested)  Surgery type/Date: 07/02/23 L breast lumpectomy and SLNB Number of lymph nodes removed: 0/2 Current/past treatment (chemo, radiation, hormone therapy): does not need chemo, will require radiation, will need hormone therapy Other symptoms:  Heaviness/tightness Yes Pain Yes Pitting edema No Infections No Decreased scar mobility Yes Stemmer sign No   TREATMENT PERFORMED: 08/01/23: Therapeutic Exercises Pulleys into flex and abd x 2 mins each with VC's to decrease scapular compensation and relax shoulders Roll yellow ball up wall into flex and Lt UE abd x 10 each Therapeutic Activities Supine (attempted on 1/2 foam roll but pt had vertigo) for following: Bil UE horz abd x 10, bil UE scaption into a "V" x 10, and then bil UE abd into a "snow angel", bil UE flex x 10 Manual Therapy P/ROM to Lt shoulder into flex and abduction with pt demonstrating full ROM Scar Tissue massage over axilla where palpable tightness over lymph node bed and to scar where lipoma was removed as  well as lumpectomy scar    07/29/23: Therapeutic Exercises Pulleys into flex and abd x 2 mins each with VC's to decrease scapular compensation and relax shoulders Roll yellow ball up wall into flex and Lt UE abd x 10 each Therapeutic Activities Supine over half foam roll for following: Bil UE horz abd x 10, bil UE scaption into a "V" x 10, and then bil UE abd into a "snow angel" Manual Therapy P/ROM to Lt shoulder into flex, abd, and D2 with scapular depression throughout by therapist Scar Tissue massage over axilla where palpable tightness over lymph node bed, began instructing pt in scar tissue massage but advised her to ask Dr. Cherlynn Cornfield if ok to begin this when she sees her this week.  Cut and issued 1/4" gray foam in TG soft for pt to wear at bra band where rubbing incision.   07/25/23: Pulleys x 2 min in direction of abduction and in direction of flexion with pt returning therapist demo and feeling decreased discomfort in axilla by the end Instructed pt in supine dowel exercises as follows with pt returning therapist demo: L shoulder abduction x 5 reps with 3 sec holds, bilateral shoulder flexion x 10 reps with 3 sec holds  PATIENT EDUCATION:  Education  details: nerve desensitization, supine dowel exercises, lymphedema risk reduction and importance of skin care Person educated: Patient Education method: Explanation, Demonstration, Tactile cues, and Handouts Education comprehension: verbalized understanding and returned demonstration  HOME EXERCISE PROGRAM: Reviewed previously given post op HEP. Supine dowel exercises in to abduction and flexion  ASSESSMENT:  CLINICAL IMPRESSION: Continued with AAROM exercises today. Did not do the half foam roll because it aggravates pt's vertigo. She did well just uspine on the mat for ROM exercises with good stretch felt. Continued with scar massage to all 3 scars to decrease tightness and improve mobility.   Pt will benefit from skilled  therapeutic intervention to improve on the following deficits: Decreased knowledge of precautions, impaired UE functional use, pain, decreased ROM, postural dysfunction.   PT treatment/interventions: ADL/Self care home management, [id number]- PT Re-evaluation, 97110-Therapeutic exercises, 97530- Therapeutic activity, [id number]- Neuromuscular re-education, 97535- Self Care, 60454- Manual therapy, [id number]- Orthotic Initial, and [id number]- Orthotic/Prosthetic subsequent   GOALS: Goals reviewed with patient? Yes  LONG TERM GOALS:  (STG=LTG)  GOALS Name Target Date  Goal status  1 Pt will demonstrate she has regained full shoulder ROM and function post operatively compared to baselines.  Baseline: 08/22/23 INITIAL  2 Pt will report no pain at end range of L shoulder abduction to allow improved function. 08/22/23 INITIAL  3 Pt will be independent in a home exercise program for continued stretching and strengthening.  08/22/23 INITIAL     PLAN:  PT FREQUENCY/DURATION: 2x/wk for 4 wks  PLAN FOR NEXT SESSION: pulleys, ball, PROM to L shoulder, MFR to possible cording in L axilla   [facility]  [address]  [place] [address]  [phone] [place], [place] 08/01/2023, 4:02 PM

## 2023-08-01 NOTE — Telephone Encounter (Signed)
 One additional refill will be sent to last until her appt in July.

## 2023-08-01 NOTE — Telephone Encounter (Signed)
 This was filled on may 20th? What is her concern?

## 2023-08-02 ENCOUNTER — Other Ambulatory Visit: Payer: Self-pay | Admitting: Hematology and Oncology

## 2023-08-02 DIAGNOSIS — Z510 Encounter for antineoplastic radiation therapy: Secondary | ICD-10-CM | POA: Diagnosis not present

## 2023-08-02 NOTE — Progress Notes (Signed)
 I called the pt, menopausal status noted. We can consider aromatase inhibitors for adj therapy.  Norma [name]

## 2023-08-06 ENCOUNTER — Encounter: Payer: Self-pay | Admitting: *Deleted

## 2023-08-06 ENCOUNTER — Ambulatory Visit

## 2023-08-06 DIAGNOSIS — M25612 Stiffness of left shoulder, not elsewhere classified: Secondary | ICD-10-CM | POA: Diagnosis not present

## 2023-08-06 DIAGNOSIS — Z170 Estrogen receptor positive status [ER+]: Secondary | ICD-10-CM

## 2023-08-06 DIAGNOSIS — R293 Abnormal posture: Secondary | ICD-10-CM

## 2023-08-06 DIAGNOSIS — Z483 Aftercare following surgery for neoplasm: Secondary | ICD-10-CM

## 2023-08-06 NOTE — Therapy (Signed)
 OUTPATIENT PHYSICAL THERAPY BREAST CANCER TREATMENT   Patient Name: Norma [name] MRN: [id number] DOB:June 15, 1970, 53 y.o., female Today's Date: 08/06/2023  END OF SESSION:  PT End of Session - 08/06/23 1355     Visit Number 5    Number of Visits 10    Date for PT Re-Evaluation 08/22/23    PT Start Time 1400    PT Stop Time 1459    PT Time Calculation (min) 59 min    Activity Tolerance Patient tolerated treatment well    Behavior During Therapy [name] for tasks assessed/performed             Past Medical History:  Diagnosis Date   Allergy    Anemia    hx of   Anxiety    Arthritis    KNEES   Asthma    treated for during COVID tx   Cancer (HCC) 06/2023   left breast IDC   Chronic headache    Colon polyps    TUBULAR ADENOMAS AND HYPERPLASTIC    Complication of anesthesia    DDD (degenerative disc disease), thoracic    Depression    Diabetes mellitus, type 2 (HCC)    Difficult airway for intubation    Fibromyalgia    GERD (gastroesophageal reflux disease)    Goiter    Hyperlipidemia    Hypertension    Low back pain    Obesity    PONV (postoperative nausea and vomiting)    RA (rheumatoid arthritis) (HCC)    Seasonal allergies    Vitamin D  deficiency    Past Surgical History:  Procedure Laterality Date   BACK SURGERY  2005   lumbar laminectomy   BALLOON DILATION N/A 06/01/2021   Procedure: BALLOON DILATION;  Surgeon: Nannette Babe, MD;  Location: [facility];  Service: Gastroenterology;  Laterality: N/A;   BARTHOLIN GLAND CYST EXCISION     BIOPSY  06/01/2021   Procedure: BIOPSY;  Surgeon: Nannette Babe, MD;  Location: [facility];  Service: Gastroenterology;;   BREAST LUMPECTOMY WITH RADIOACTIVE SEED AND SENTINEL LYMPH NODE BIOPSY Left 07/02/2023   Procedure: BREAST LUMPECTOMY WITH RADIOACTIVE SEED AND SENTINEL LYMPH NODE BIOPSY;  Surgeon: Lockie Rima, MD;  Location: [facility];  Service: General;  Laterality: Left;  90 MIN 40981- EXCISION  LEFT CHEST WALL MASS GEN COMBINED WITH REGIONAL   COLONOSCOPY  2017   JMP-MAC-prep good-TA -recall 5 yr   COLONOSCOPY WITH PROPOFOL  N/A 06/01/2021   Procedure: COLONOSCOPY WITH PROPOFOL ;  Surgeon: Nannette Babe, MD;  Location: [facility];  Service: Gastroenterology;  Laterality: N/A;   ESOPHAGOGASTRODUODENOSCOPY (EGD) WITH PROPOFOL  N/A 06/01/2021   Procedure: ESOPHAGOGASTRODUODENOSCOPY (EGD) WITH PROPOFOL ;  Surgeon: Nannette Babe, MD;  Location: [facility];  Service: Gastroenterology;  Laterality: N/A;   LUMBAR LAMINECTOMY/DECOMPRESSION MICRODISCECTOMY Right 07/17/2013   Procedure: LUMBAR LAMINECTOMY/DECOMPRESSION MICRODISCECTOMY 1 LEVEL,RIGHT LUMBAR FOUR-FIVE;  Surgeon: Augustine Blocker, MD;  Location: [facility];  Service: Neurosurgery;  Laterality: Right;  Right   MASS EXCISION Left 07/02/2023   Procedure: EXCISION, MASS, CHEST WALL;  Surgeon: Lockie Rima, MD;  Location: [facility];  Service: General;  Laterality: Left;   PARTIAL HYSTERECTOMY     POLYPECTOMY  2017   TA and benign polypoid   POLYPECTOMY  06/01/2021   Procedure: POLYPECTOMY;  Surgeon: Nannette Babe, MD;  Location: [facility];  Service: Gastroenterology;;   WISDOM TOOTH EXTRACTION     Patient Active Problem List   Diagnosis Date Noted  Genetic testing 06/23/2023   Malignant neoplasm of upper-outer quadrant of left breast in female, estrogen receptor positive (HCC) 06/11/2023   Gastritis without bleeding    Esophageal dysphagia    Schatzki's ring    Benign neoplasm of ascending colon    Class 3 severe obesity with serious comorbidity and body mass index (BMI) of 40.0 to 44.9 in adult 08/25/2019   Type 2 diabetes mellitus without complication, without long-term current use of insulin (HCC) 08/25/2019   Chronic cough 06/27/2017   LUQ abdominal pain 12/07/2014   RUQ abdominal pain 11/10/2013   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   Gastroesophageal reflux disease 12/18/2012   Hx of adenomatous colonic  polyps 12/18/2012   Chronic RLQ pain 12/18/2012   IBS (irritable bowel syndrome) 01/22/2011   HTN (hypertension) 01/22/2011   Hyperlipidemia 01/22/2011   Anxiety and depression 01/22/2011    PCP: Winda Hastings, NP  REFERRING PROVIDER: Dr. Lockie Rima   REFERRING DIAG: Left breast cancer  THERAPY DIAG:  Stiffness of left shoulder, not elsewhere classified  Aftercare following surgery for neoplasm  Abnormal posture  Malignant neoplasm of upper-outer quadrant of left breast in female, estrogen receptor positive (HCC)  Rationale for Evaluation and Treatment: Rehabilitation  ONSET DATE: 04/26/23  SUBJECTIVE:                                                                                                                                                                                           SUBJECTIVE STATEMENT: I don't feel like I have any ROM restrictions. I have the foam pad in at the axillary region. I developed a rash on the area where lipoma was after she did the scar massage last time.  It seems to be improving.The cording is still a little sore.  PERTINENT HISTORY:  Patient was diagnosed on 04/26/2023 with left grade 3 invasive ductal carcinoma breast cancer. It measures 1 cm and is located in the upper outer quadrant. It is ER/PR positive and HER2 negative with a Ki67 of 30%. Pt underwent a L lumpectomy and SLNB 0/2 on 07/02/23  PATIENT GOALS:  Reassess how my recovery is going related to arm function, pain, and swelling.  PAIN:  Are you having pain? 2/10 from bra rubbing under my arm  PRECAUTIONS: Recent Surgery, left UE Lymphedema risk,   RED FLAGS: None   ACTIVITY LEVEL / LEISURE: doing post op exercises   OBJECTIVE:   PATIENT SURVEYS:  QUICK DASH:     OBSERVATIONS: Healing scars with some increased scar tissue at L axilla  POSTURE:  Forward head and rounded shoulders posture   UPPER EXTREMITY AROM/PROM:   A/PROM  RIGHT   eval    Shoulder  extension 48  Shoulder flexion 153  Shoulder abduction 165  Shoulder internal rotation 63  Shoulder external rotation 84                          (Blank rows = not tested)   A/PROM LEFT   eval LEFT  07/25/23  Shoulder extension 35 70  Shoulder flexion 150 162  Shoulder abduction 167 156  Shoulder internal rotation 63 69  Shoulder external rotation 75 80                          (Blank rows = not tested)   CERVICAL AROM: All within normal limits   UPPER EXTREMITY STRENGTH: WFL   LYMPHEDEMA ASSESSMENTS (in cm):    LANDMARK RIGHT   eval  10 cm proximal to olecranon process 38.8  Olecranon process 28.7  10 cm proximal to ulnar styloid process 25.8  Just proximal to ulnar styloid process 17  Across hand at thumb web space 20.5  At base of 2nd digit 5.9  (Blank rows = not tested)   LANDMARK LEFT   eval LEFT 07/25/23  10 cm proximal to olecranon process 40 38.4  Olecranon process 28.8 29.5  10 cm proximal to ulnar styloid process 24.3 23  Just proximal to ulnar styloid process 16.3 16.5  Across hand at thumb web space 19.5 20  At base of 2nd digit 5.9 5.8  (Blank rows = not tested)  Surgery type/Date: 07/02/23 L breast lumpectomy and SLNB Number of lymph nodes removed: 0/2 Current/past treatment (chemo, radiation, hormone therapy): does not need chemo, will require radiation, will need hormone therapy Other symptoms:  Heaviness/tightness Yes Pain Yes Pitting edema No Infections No Decreased scar mobility Yes Stemmer sign No   TREATMENT PERFORMED:  08/06/2023 Therapeutic Exercises Pulleys into flex and abd x 2 mins each with VC's to decrease scapular compensation and relax shoulders Roll yellow ball up wall into flex and Lt UE abd x 10 each Supine  for following: Bil UE horz abd x 5, bil UE scaption into a "V" x 5, flexion x 5, and then bil UE abd into a "snow angel", bil UE flex x 10 Lower trunk rotation with arms in abd x 4 ea Manual Therapy P/ROM to Lt  shoulder into flex and abduction, scaption, IR and ER  Scar Tissue massage over axilla where palpable tightness over lymph node bed, and STM without cocoa butter to axillary border of pectorals     08/01/23: Therapeutic Exercises Pulleys into flex and abd x 2 mins each with VC's to decrease scapular compensation and relax shoulders Roll yellow ball up wall into flex and Lt UE abd x 10 each Therapeutic Activities Supine (attempted on 1/2 foam roll but pt had vertigo) for following: Bil UE horz abd x 10, bil UE scaption into a "V" x 10, and then bil UE abd into a "snow angel", bil UE flex x 10 Manual Therapy P/ROM to Lt shoulder into flex and abduction with pt demonstrating full ROM Scar Tissue massage over axilla where palpable tightness over lymph node bed and to scar where lipoma was removed as well as lumpectomy scar    07/29/23: Therapeutic Exercises Pulleys into flex and abd x 2 mins each with VC's to decrease scapular compensation and relax shoulders Roll yellow ball up wall into flex and Lt UE abd x 10  each Therapeutic Activities Supine over half foam roll for following: Bil UE horz abd x 10, bil UE scaption into a "V" x 10, and then bil UE abd into a "snow angel" Manual Therapy P/ROM to Lt shoulder into flex, abd, and D2 with scapular depression throughout by therapist Scar Tissue massage over axilla where palpable tightness over lymph node bed, began instructing pt in scar tissue massage but advised her to ask Dr. Cherlynn Cornfield if ok to begin this when she sees her this week.  Cut and issued 1/4" gray foam in TG soft for pt to wear at bra band where rubbing incision.   07/25/23: Pulleys x 2 min in direction of abduction and in direction of flexion with pt returning therapist demo and feeling decreased discomfort in axilla by the end Instructed pt in supine dowel exercises as follows with pt returning therapist demo: L shoulder abduction x 5 reps with 3 sec holds, bilateral shoulder flexion  x 10 reps with 3 sec holds  PATIENT EDUCATION:  Access Code: [email or web address] URL: [email or web address] Date: 08/06/2023 Prepared by: Sharon December  Exercises - Single Arm Doorway Pec Stretch at 90 Degrees Abduction  - 2 x daily - 7 x weekly - 1 sets - 3 reps - 20-30 hold - Supine Lower Trunk Rotation  - 1 x daily - 7 x weekly - 1 sets - 3 reps - 20-30 hold Education details: nerve desensitization, supine dowel exercises, lymphedema risk reduction and importance of skin care Person educated: Patient Education method: Explanation, Demonstration, Tactile cues, and Handouts Education comprehension: verbalized understanding and returned demonstration  HOME EXERCISE PROGRAM: Reviewed previously given post op HEP. Supine dowel exercises in to abduction and flexion  ASSESSMENT:  CLINICAL IMPRESSION:  Pt is doing very well with AROM/PROM. Felt good stretch at axillary border of pecs with LTR and wall stretch. Mild rash present over lipoma incision and no scar mobilization done today to that area. Pt will benefit from skilled therapeutic intervention to improve on the following deficits: Decreased knowledge of precautions, impaired UE functional use, pain, decreased ROM, postural dysfunction.   PT treatment/interventions: ADL/Self care home management, [phone]- PT Re-evaluation, 97110-Therapeutic exercises, 97530- Therapeutic activity, [id number]- Neuromuscular re-education, 97535- Self Care, [id number]- Manual therapy, [id number]- Orthotic Initial, and [id number]- Orthotic/Prosthetic subsequent   GOALS: Goals reviewed with patient? Yes  LONG TERM GOALS:  (STG=LTG)  GOALS Name Target Date  Goal status  1 Pt will demonstrate she has regained full shoulder ROM and function post operatively compared to baselines.  Baseline: 08/22/23 INITIAL  2 Pt will report no pain at end range of L shoulder abduction to allow improved function. 08/22/23 INITIAL  3 Pt will be independent in a home exercise program for  continued stretching and strengthening.  08/22/23 INITIAL     PLAN:  PT FREQUENCY/DURATION: 2x/wk for 4 wks  PLAN FOR NEXT SESSION: STM with cocoa butter to axillary border of pecs,pulleys, ball, PROM to L shoulder, MFR to possible cording in L axilla   [facility]  [address]  [place] [address]  [phone]     Latisha Poland, PT 08/06/2023, 3:00 PM

## 2023-08-06 NOTE — Telephone Encounter (Signed)
 Patient identified by name and date of birth.   Patient aware of response and voiced understanding.

## 2023-08-07 ENCOUNTER — Other Ambulatory Visit: Payer: Self-pay | Admitting: Family Medicine

## 2023-08-07 DIAGNOSIS — E1165 Type 2 diabetes mellitus with hyperglycemia: Secondary | ICD-10-CM

## 2023-08-08 ENCOUNTER — Other Ambulatory Visit: Payer: Self-pay

## 2023-08-08 ENCOUNTER — Encounter: Payer: Self-pay | Admitting: Physical Therapy

## 2023-08-08 ENCOUNTER — Ambulatory Visit: Admitting: Physical Therapy

## 2023-08-08 ENCOUNTER — Ambulatory Visit
Admission: RE | Admit: 2023-08-08 | Discharge: 2023-08-08 | Disposition: A | Discharge status: Home / Self Care | Source: Ambulatory Visit | Attending: Radiation Oncology | Admitting: Radiation Oncology

## 2023-08-08 DIAGNOSIS — Z483 Aftercare following surgery for neoplasm: Secondary | ICD-10-CM

## 2023-08-08 DIAGNOSIS — M25612 Stiffness of left shoulder, not elsewhere classified: Secondary | ICD-10-CM | POA: Diagnosis not present

## 2023-08-08 DIAGNOSIS — R293 Abnormal posture: Secondary | ICD-10-CM

## 2023-08-08 DIAGNOSIS — Z510 Encounter for antineoplastic radiation therapy: Secondary | ICD-10-CM | POA: Diagnosis not present

## 2023-08-08 DIAGNOSIS — C50412 Malignant neoplasm of upper-outer quadrant of left female breast: Secondary | ICD-10-CM

## 2023-08-08 LAB — RAD ONC ARIA SESSION SUMMARY
Course Elapsed Days: 0
Plan Fractions Treated to Date: 1
Plan Prescribed Dose Per Fraction: 2.66 Gy
Plan Total Fractions Prescribed: 16
Plan Total Prescribed Dose: 42.56 Gy
Reference Point Dosage Given to Date: 2.66 Gy
Reference Point Session Dosage Given: 2.66 Gy
Session Number: 1

## 2023-08-08 NOTE — Therapy (Signed)
 OUTPATIENT PHYSICAL THERAPY BREAST CANCER TREATMENT   Patient Name: Norma [name] MRN: [id number] DOB:June 21, 1970, 53 y.o., female Today's Date: 08/08/2023  END OF SESSION:  PT End of Session - 08/08/23 1516     Visit Number 6    Number of Visits 10    Date for PT Re-Evaluation 08/22/23    PT Start Time 1401    PT Stop Time 1505    PT Time Calculation (min) 64 min    Activity Tolerance Patient tolerated treatment well    Behavior During Therapy WFL for tasks assessed/performed              Past Medical History:  Diagnosis Date   Allergy    Anemia    hx of   Anxiety    Arthritis    KNEES   Asthma    treated for during COVID tx   Cancer (HCC) 06/2023   left breast IDC   Chronic headache    Colon polyps    TUBULAR ADENOMAS AND HYPERPLASTIC    Complication of anesthesia    DDD (degenerative disc disease), thoracic    Depression    Diabetes mellitus, type 2 (HCC)    Difficult airway for intubation    Fibromyalgia    GERD (gastroesophageal reflux disease)    Goiter    Hyperlipidemia    Hypertension    Low back pain    Obesity    PONV (postoperative nausea and vomiting)    RA (rheumatoid arthritis) (HCC)    Seasonal allergies    Vitamin D  deficiency    Past Surgical History:  Procedure Laterality Date   BACK SURGERY  2005   lumbar laminectomy   BALLOON DILATION N/A 06/01/2021   Procedure: BALLOON DILATION;  Surgeon: Nannette Babe, MD;  Location: [facility];  Service: Gastroenterology;  Laterality: N/A;   BARTHOLIN GLAND CYST EXCISION     BIOPSY  06/01/2021   Procedure: BIOPSY;  Surgeon: Nannette Babe, MD;  Location: [facility];  Service: Gastroenterology;;   BREAST LUMPECTOMY WITH RADIOACTIVE SEED AND SENTINEL LYMPH NODE BIOPSY Left 07/02/2023   Procedure: BREAST LUMPECTOMY WITH RADIOACTIVE SEED AND SENTINEL LYMPH NODE BIOPSY;  Surgeon: Lockie Rima, MD;  Location: [facility];  Service: General;  Laterality: Left;  90 MIN 19147- EXCISION  LEFT CHEST WALL MASS GEN COMBINED WITH REGIONAL   COLONOSCOPY  2017   JMP-MAC-prep good-TA -recall 5 yr   COLONOSCOPY WITH PROPOFOL  N/A 06/01/2021   Procedure: COLONOSCOPY WITH PROPOFOL ;  Surgeon: Nannette Babe, MD;  Location: [facility];  Service: Gastroenterology;  Laterality: N/A;   ESOPHAGOGASTRODUODENOSCOPY (EGD) WITH PROPOFOL  N/A 06/01/2021   Procedure: ESOPHAGOGASTRODUODENOSCOPY (EGD) WITH PROPOFOL ;  Surgeon: Nannette Babe, MD;  Location: [facility];  Service: Gastroenterology;  Laterality: N/A;   LUMBAR LAMINECTOMY/DECOMPRESSION MICRODISCECTOMY Right 07/17/2013   Procedure: LUMBAR LAMINECTOMY/DECOMPRESSION MICRODISCECTOMY 1 LEVEL,RIGHT LUMBAR FOUR-FIVE;  Surgeon: Augustine Blocker, MD;  Location: [facility];  Service: Neurosurgery;  Laterality: Right;  Right   MASS EXCISION Left 07/02/2023   Procedure: EXCISION, MASS, CHEST WALL;  Surgeon: Lockie Rima, MD;  Location: [facility];  Service: General;  Laterality: Left;   PARTIAL HYSTERECTOMY     POLYPECTOMY  2017   TA and benign polypoid   POLYPECTOMY  06/01/2021   Procedure: POLYPECTOMY;  Surgeon: Nannette Babe, MD;  Location: [facility];  Service: Gastroenterology;;   WISDOM TOOTH EXTRACTION     Patient Active Problem List   Diagnosis Date Noted  Genetic testing 06/23/2023   Malignant neoplasm of upper-outer quadrant of left breast in female, estrogen receptor positive (HCC) 06/11/2023   Gastritis without bleeding    Esophageal dysphagia    Schatzki's ring    Benign neoplasm of ascending colon    Class 3 severe obesity with serious comorbidity and body mass index (BMI) of 40.0 to 44.9 in adult 08/25/2019   Type 2 diabetes mellitus without complication, without long-term current use of insulin (HCC) 08/25/2019   Chronic cough 06/27/2017   LUQ abdominal pain 12/07/2014   RUQ abdominal pain 11/10/2013   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   Gastroesophageal reflux disease 12/18/2012   Hx of adenomatous colonic  polyps 12/18/2012   Chronic RLQ pain 12/18/2012   IBS (irritable bowel syndrome) 01/22/2011   HTN (hypertension) 01/22/2011   Hyperlipidemia 01/22/2011   Anxiety and depression 01/22/2011    PCP: Winda Hastings, NP  REFERRING PROVIDER: Dr. Lockie Rima   REFERRING DIAG: Left breast cancer  THERAPY DIAG:  Stiffness of left shoulder, not elsewhere classified  Aftercare following surgery for neoplasm  Abnormal posture  Malignant neoplasm of upper-outer quadrant of left breast in female, estrogen receptor positive (HCC)  Rationale for Evaluation and Treatment: Rehabilitation  ONSET DATE: 04/26/23  SUBJECTIVE:                                                                                                                                                                                           SUBJECTIVE STATEMENT: I have been having more breast pain for some reason. It just started 2 days ago. I have had to take more medicine recently.   PERTINENT HISTORY:  Patient was diagnosed on 04/26/2023 with left grade 3 invasive ductal carcinoma breast cancer. It measures 1 cm and is located in the upper outer quadrant. It is ER/PR positive and HER2 negative with a Ki67 of 30%. Pt underwent a L lumpectomy and SLNB 0/2 on 07/02/23  PATIENT GOALS:  Reassess how my recovery is going related to arm function, pain, and swelling.  PAIN:  Are you having pain? 2/10 from bra rubbing under my arm  PRECAUTIONS: Recent Surgery, left UE Lymphedema risk,   RED FLAGS: None   ACTIVITY LEVEL / LEISURE: doing post op exercises   OBJECTIVE:   PATIENT SURVEYS:  QUICK DASH:     OBSERVATIONS: Healing scars with some increased scar tissue at L axilla  POSTURE:  Forward head and rounded shoulders posture   UPPER EXTREMITY AROM/PROM:   A/PROM RIGHT   eval    Shoulder extension 48  Shoulder flexion 153  Shoulder abduction 165  Shoulder internal rotation 63  Shoulder external rotation 84                           (Blank rows = not tested)   A/PROM LEFT   eval LEFT  07/25/23 LEFT 08/08/23  Shoulder extension 35 70   Shoulder flexion 150 162 171  Shoulder abduction 167 156 151  Shoulder internal rotation 63 69   Shoulder external rotation 75 80                           (Blank rows = not tested)   CERVICAL AROM: All within normal limits   UPPER EXTREMITY STRENGTH: WFL   LYMPHEDEMA ASSESSMENTS (in cm):    LANDMARK RIGHT   eval  10 cm proximal to olecranon process 38.8  Olecranon process 28.7  10 cm proximal to ulnar styloid process 25.8  Just proximal to ulnar styloid process 17  Across hand at thumb web space 20.5  At base of 2nd digit 5.9  (Blank rows = not tested)   LANDMARK LEFT   eval LEFT 07/25/23  10 cm proximal to olecranon process 40 38.4  Olecranon process 28.8 29.5  10 cm proximal to ulnar styloid process 24.3 23  Just proximal to ulnar styloid process 16.3 16.5  Across hand at thumb web space 19.5 20  At base of 2nd digit 5.9 5.8  (Blank rows = not tested)  Surgery type/Date: 07/02/23 L breast lumpectomy and SLNB Number of lymph nodes removed: 0/2 Current/past treatment (chemo, radiation, hormone therapy): does not need chemo, will require radiation, will need hormone therapy Other symptoms:  Heaviness/tightness Yes Pain Yes Pitting edema No Infections No Decreased scar mobility Yes Stemmer sign No   TREATMENT PERFORMED: 08/08/2023 Therapeutic Exercises Pulleys into flex and abd x 2 min 30 sec each with VC's to decrease scapular compensation and relax shoulders Roll yellow ball up wall into flex and Lt UE abd x 10 each Supine  for following: bil UE scaption into a "V" x 10, flexion x 10, and then bil UE abd into a "snow angel", bil UE flex x 10 Demonstrated door way stretch for pec stretch x 30 sec Manual Therapy MLD as follows for edema at L axilla: short neck, 5 diaphragmatic breaths, R axillary nodes, establishment of interaxillary  pathway, L inguinal nodes and establishment of axillo inguinal pathway, L axilla moving fluid towards pathways then retracing steps. Educated pt in proper skin stretch technique and anatomy and physiology of the lymphatic system. Scar massage to SLNB scar    08/06/2023 Therapeutic Exercises Pulleys into flex and abd x 2 mins each with VC's to decrease scapular compensation and relax shoulders Roll yellow ball up wall into flex and Lt UE abd x 10 each Supine  for following: Bil UE horz abd x 5, bil UE scaption into a "V" x 5, flexion x 5, and then bil UE abd into a "snow angel", bil UE flex x 10 Lower trunk rotation with arms in abd x 4 ea Manual Therapy P/ROM to Lt shoulder into flex and abduction, scaption, IR and ER  Scar Tissue massage over axilla where palpable tightness over lymph node bed, and STM without cocoa butter to axillary border of pectorals     08/01/23: Therapeutic Exercises Pulleys into flex and abd x 2 mins each with VC's to decrease scapular compensation and relax shoulders Roll yellow ball up wall into flex and Lt UE abd  x 10 each Therapeutic Activities Supine (attempted on 1/2 foam roll but pt had vertigo) for following: Bil UE horz abd x 10, bil UE scaption into a "V" x 10, and then bil UE abd into a "snow angel", bil UE flex x 10 Manual Therapy P/ROM to Lt shoulder into flex and abduction with pt demonstrating full ROM Scar Tissue massage over axilla where palpable tightness over lymph node bed and to scar where lipoma was removed as well as lumpectomy scar    07/29/23: Therapeutic Exercises Pulleys into flex and abd x 2 mins each with VC's to decrease scapular compensation and relax shoulders Roll yellow ball up wall into flex and Lt UE abd x 10 each Therapeutic Activities Supine over half foam roll for following: Bil UE horz abd x 10, bil UE scaption into a "V" x 10, and then bil UE abd into a "snow angel" Manual Therapy P/ROM to Lt shoulder into flex, abd, and  D2 with scapular depression throughout by therapist Scar Tissue massage over axilla where palpable tightness over lymph node bed, began instructing pt in scar tissue massage but advised her to ask Dr. Cherlynn Cornfield if ok to begin this when she sees her this week.  Cut and issued 1/4" gray foam in TG soft for pt to wear at bra band where rubbing incision.   07/25/23: Pulleys x 2 min in direction of abduction and in direction of flexion with pt returning therapist demo and feeling decreased discomfort in axilla by the end Instructed pt in supine dowel exercises as follows with pt returning therapist demo: L shoulder abduction x 5 reps with 3 sec holds, bilateral shoulder flexion x 10 reps with 3 sec holds  PATIENT EDUCATION:  Access Code: [email or web address] URL: [email or web address] Date: 08/06/2023 Prepared by: Sharon December  Exercises - Single Arm Doorway Pec Stretch at 90 Degrees Abduction  - 2 x daily - 7 x weekly - 1 sets - 3 reps - 20-30 hold - Supine Lower Trunk Rotation  - 1 x daily - 7 x weekly - 1 sets - 3 reps - 20-30 hold Education details: nerve desensitization, supine dowel exercises, lymphedema risk reduction and importance of skin care Person educated: Patient Education method: Explanation, Demonstration, Tactile cues, and Handouts Education comprehension: verbalized understanding and returned demonstration  HOME EXERCISE PROGRAM: Reviewed previously given post op HEP. Supine dowel exercises in to abduction and flexion  ASSESSMENT:  CLINICAL IMPRESSION: Pt has been having increased pain in her L breast. She feels swelling in her L axilla so added MLD to this area today. Continued with scar massage to SLNB scar. Remeasured ROM and pt has decreased L shoulder abduction ROM compared to a few weeks ago. She would benefit from pec stretching and manual therapy to decrease pec tightness.   Pt will benefit from skilled therapeutic intervention to improve on the following deficits:  Decreased knowledge of precautions, impaired UE functional use, pain, decreased ROM, postural dysfunction.   PT treatment/interventions: ADL/Self care home management, [phone]- PT Re-evaluation, 97110-Therapeutic exercises, 97530- Therapeutic activity, [id number]- Neuromuscular re-education, 97535- Self Care, 19147- Manual therapy, [id number]- Orthotic Initial, and [id number]- Orthotic/Prosthetic subsequent   GOALS: Goals reviewed with patient? Yes  LONG TERM GOALS:  (STG=LTG)  GOALS Name Target Date  Goal status  1 Pt will demonstrate she has regained full shoulder ROM and function post operatively compared to baselines.  Baseline: 08/22/23 INITIAL  2 Pt will report no pain at end range of L shoulder abduction to allow improved function. 08/22/23  INITIAL  3 Pt will be independent in a home exercise program for continued stretching and strengthening.  08/22/23 INITIAL     PLAN:  PT FREQUENCY/DURATION: 2x/wk for 4 wks  PLAN FOR NEXT SESSION: work on Fish farm manager stretching, STM with cocoa butter to axillary border of pecs,pulleys, ball, PROM to L shoulder, MFR to possible cording in L axilla   [facility]  [address], [address]  [place] [address]  [phone]  Self manual lymph drainage: Do circles behind the collar bones x 10  Perform this sequence once a day.  Only give enough pressure no your skin to make the skin move.  Diaphragmatic - Supine   Inhale through nose making navel move out toward hands. Exhale through puckered lips, hands follow navel in. Repeat _5__ times. Rest _10__ seconds between repeats.   Copyright  VHI. All rights reserved.  Hug yourself.  Do circles at your neck just above your collarbones.  Repeat this 10 times.  Axilla - One at a Time   Using full weight of flat hand and fingers at center of uninvolved (R) armpit, make _10__ in-place circles.   Copyright  VHI. All rights reserved.  LEG: Inguinal Nodes Stimulation   With small  finger side of hand against hip crease on involved (L) side, gently perform circles at the crease. Repeat __10_ times.   Copyright  VHI. All rights reserved.  Axilla to Inguinal Nodes - Sweep   On involved side, stretch skin from armpit along side of trunk to hip crease.  Now gently stretch skin from the involved side to the uninvolved side across the chest at the shoulder line.    Move fluid from L armpit in area that feels swollen towards these 2 pathways by gently stretching skin.   Finish by doing circles in R armpit and L groin.   Finish by doing the pathways as described above going from your involved armpit to the same side groin and going across your upper chest from the involved shoulder to the uninvolved shoulder.  Repeat the steps above where you do circles in your left groin and right armpit. Copyright  VHI. All rights reserved.    [facility], PT 08/08/2023, 3:17 PM

## 2023-08-08 NOTE — Patient Instructions (Signed)
 Self manual lymph drainage: Do circles behind the collar bones x 10  Perform this sequence once a day.  Only give enough pressure no your skin to make the skin move.  Diaphragmatic - Supine   Inhale through nose making navel move out toward hands. Exhale through puckered lips, hands follow navel in. Repeat _5__ times. Rest _10__ seconds between repeats.   Copyright  VHI. All rights reserved.  Hug yourself.  Do circles at your neck just above your collarbones.  Repeat this 10 times.  Axilla - One at a Time   Using full weight of flat hand and fingers at center of uninvolved (R) armpit, make _10__ in-place circles.   Copyright  VHI. All rights reserved.  LEG: Inguinal Nodes Stimulation   With small finger side of hand against hip crease on involved (L) side, gently perform circles at the crease. Repeat __10_ times.   Copyright  VHI. All rights reserved.  Axilla to Inguinal Nodes - Sweep   On involved side, stretch skin from armpit along side of trunk to hip crease.  Now gently stretch skin from the involved side to the uninvolved side across the chest at the shoulder line.    Move fluid from L armpit in area that feels swollen towards these 2 pathways by gently stretching skin.   Finish by doing circles in R armpit and L groin.   Finish by doing the pathways as described above going from your involved armpit to the same side groin and going across your upper chest from the involved shoulder to the uninvolved shoulder.  Repeat the steps above where you do circles in your left groin and right armpit. Copyright  VHI. All rights reserved.

## 2023-08-09 ENCOUNTER — Other Ambulatory Visit: Payer: Self-pay

## 2023-08-09 ENCOUNTER — Ambulatory Visit
Admission: RE | Admit: 2023-08-09 | Discharge: 2023-08-09 | Disposition: A | Discharge status: Home / Self Care | Source: Ambulatory Visit | Attending: Radiation Oncology | Admitting: Radiation Oncology

## 2023-08-09 DIAGNOSIS — C50412 Malignant neoplasm of upper-outer quadrant of left female breast: Secondary | ICD-10-CM

## 2023-08-09 DIAGNOSIS — Z510 Encounter for antineoplastic radiation therapy: Secondary | ICD-10-CM | POA: Diagnosis not present

## 2023-08-09 LAB — RAD ONC ARIA SESSION SUMMARY
Course Elapsed Days: 1
Plan Fractions Treated to Date: 2
Plan Prescribed Dose Per Fraction: 2.66 Gy
Plan Total Fractions Prescribed: 16
Plan Total Prescribed Dose: 42.56 Gy
Reference Point Dosage Given to Date: 5.32 Gy
Reference Point Session Dosage Given: 2.66 Gy
Session Number: 2

## 2023-08-09 MED ORDER — ALRA NON-METALLIC DEODORANT (RAD-ONC)
1.0000 | Freq: Once | TOPICAL | Status: AC
  Administered 2023-08-09: 1 via TOPICAL

## 2023-08-09 MED ORDER — RADIAPLEXRX EX GEL
Freq: Once | CUTANEOUS | Status: AC
  Administered 2023-08-09: 1 via TOPICAL

## 2023-08-12 ENCOUNTER — Ambulatory Visit

## 2023-08-13 ENCOUNTER — Ambulatory Visit

## 2023-08-13 ENCOUNTER — Ambulatory Visit
Admission: RE | Admit: 2023-08-13 | Discharge: 2023-08-13 | Disposition: A | Discharge status: Home / Self Care | Source: Ambulatory Visit | Attending: Radiation Oncology | Admitting: Radiation Oncology

## 2023-08-13 ENCOUNTER — Telehealth: Payer: Self-pay

## 2023-08-13 ENCOUNTER — Other Ambulatory Visit: Payer: Self-pay

## 2023-08-13 DIAGNOSIS — C50412 Malignant neoplasm of upper-outer quadrant of left female breast: Secondary | ICD-10-CM | POA: Insufficient documentation

## 2023-08-13 DIAGNOSIS — Z1721 Progesterone receptor positive status: Secondary | ICD-10-CM | POA: Diagnosis not present

## 2023-08-13 DIAGNOSIS — Z1732 Human epidermal growth factor receptor 2 negative status: Secondary | ICD-10-CM | POA: Insufficient documentation

## 2023-08-13 DIAGNOSIS — Z510 Encounter for antineoplastic radiation therapy: Secondary | ICD-10-CM | POA: Diagnosis present

## 2023-08-13 DIAGNOSIS — Z170 Estrogen receptor positive status [ER+]: Secondary | ICD-10-CM | POA: Insufficient documentation

## 2023-08-13 LAB — RAD ONC ARIA SESSION SUMMARY
Course Elapsed Days: 5
Plan Fractions Treated to Date: 3
Plan Prescribed Dose Per Fraction: 2.66 Gy
Plan Total Fractions Prescribed: 16
Plan Total Prescribed Dose: 42.56 Gy
Reference Point Dosage Given to Date: 7.98 Gy
Reference Point Session Dosage Given: 2.66 Gy
Session Number: 3

## 2023-08-13 NOTE — Telephone Encounter (Signed)
 Notified Patient of completion of FMLA Forms. Fax transmission confirmation received. Copy of forms placed for pick-up as requested. No other needs or concerns noted at this time.

## 2023-08-14 ENCOUNTER — Other Ambulatory Visit: Payer: Self-pay

## 2023-08-14 ENCOUNTER — Ambulatory Visit
Admission: RE | Admit: 2023-08-14 | Discharge: 2023-08-14 | Disposition: A | Discharge status: Home / Self Care | Source: Ambulatory Visit | Attending: Radiation Oncology | Admitting: Radiation Oncology

## 2023-08-14 DIAGNOSIS — Z510 Encounter for antineoplastic radiation therapy: Secondary | ICD-10-CM | POA: Diagnosis not present

## 2023-08-14 LAB — RAD ONC ARIA SESSION SUMMARY
Course Elapsed Days: 6
Plan Fractions Treated to Date: 4
Plan Prescribed Dose Per Fraction: 2.66 Gy
Plan Total Fractions Prescribed: 16
Plan Total Prescribed Dose: 42.56 Gy
Reference Point Dosage Given to Date: 10.64 Gy
Reference Point Session Dosage Given: 2.66 Gy
Session Number: 4

## 2023-08-15 ENCOUNTER — Ambulatory Visit

## 2023-08-15 ENCOUNTER — Ambulatory Visit
Admission: RE | Admit: 2023-08-15 | Discharge: 2023-08-15 | Discharge status: Home / Self Care | Source: Ambulatory Visit | Attending: Radiation Oncology

## 2023-08-15 ENCOUNTER — Other Ambulatory Visit: Payer: Self-pay

## 2023-08-15 DIAGNOSIS — Z510 Encounter for antineoplastic radiation therapy: Secondary | ICD-10-CM | POA: Diagnosis not present

## 2023-08-15 LAB — RAD ONC ARIA SESSION SUMMARY
Course Elapsed Days: 7
Plan Fractions Treated to Date: 5
Plan Prescribed Dose Per Fraction: 2.66 Gy
Plan Total Fractions Prescribed: 16
Plan Total Prescribed Dose: 42.56 Gy
Reference Point Dosage Given to Date: 13.3 Gy
Reference Point Session Dosage Given: 2.66 Gy
Session Number: 5

## 2023-08-16 ENCOUNTER — Ambulatory Visit
Admission: RE | Admit: 2023-08-16 | Discharge: 2023-08-16 | Disposition: A | Discharge status: Home / Self Care | Source: Ambulatory Visit | Attending: Radiation Oncology | Admitting: Radiation Oncology

## 2023-08-16 ENCOUNTER — Other Ambulatory Visit: Payer: Self-pay

## 2023-08-16 DIAGNOSIS — Z510 Encounter for antineoplastic radiation therapy: Secondary | ICD-10-CM | POA: Diagnosis not present

## 2023-08-16 LAB — RAD ONC ARIA SESSION SUMMARY
Course Elapsed Days: 8
Plan Fractions Treated to Date: 6
Plan Prescribed Dose Per Fraction: 2.66 Gy
Plan Total Fractions Prescribed: 16
Plan Total Prescribed Dose: 42.56 Gy
Reference Point Dosage Given to Date: 15.96 Gy
Reference Point Session Dosage Given: 2.66 Gy
Session Number: 6

## 2023-08-19 ENCOUNTER — Ambulatory Visit
Admission: RE | Admit: 2023-08-19 | Discharge: 2023-08-19 | Disposition: A | Discharge status: Home / Self Care | Source: Ambulatory Visit | Attending: Radiation Oncology | Admitting: Radiation Oncology

## 2023-08-19 ENCOUNTER — Other Ambulatory Visit: Payer: Self-pay

## 2023-08-19 DIAGNOSIS — Z510 Encounter for antineoplastic radiation therapy: Secondary | ICD-10-CM | POA: Diagnosis not present

## 2023-08-19 LAB — RAD ONC ARIA SESSION SUMMARY
Course Elapsed Days: 11
Plan Fractions Treated to Date: 7
Plan Prescribed Dose Per Fraction: 2.66 Gy
Plan Total Fractions Prescribed: 16
Plan Total Prescribed Dose: 42.56 Gy
Reference Point Dosage Given to Date: 18.62 Gy
Reference Point Session Dosage Given: 2.66 Gy
Session Number: 7

## 2023-08-20 ENCOUNTER — Encounter

## 2023-08-20 ENCOUNTER — Ambulatory Visit
Admission: RE | Admit: 2023-08-20 | Discharge: 2023-08-20 | Disposition: A | Discharge status: Home / Self Care | Source: Ambulatory Visit | Attending: Radiation Oncology

## 2023-08-20 ENCOUNTER — Other Ambulatory Visit: Payer: Self-pay

## 2023-08-20 DIAGNOSIS — Z510 Encounter for antineoplastic radiation therapy: Secondary | ICD-10-CM | POA: Diagnosis not present

## 2023-08-20 LAB — RAD ONC ARIA SESSION SUMMARY
Course Elapsed Days: 12
Plan Fractions Treated to Date: 8
Plan Prescribed Dose Per Fraction: 2.66 Gy
Plan Total Fractions Prescribed: 16
Plan Total Prescribed Dose: 42.56 Gy
Reference Point Dosage Given to Date: 21.28 Gy
Reference Point Session Dosage Given: 2.66 Gy
Session Number: 8

## 2023-08-21 ENCOUNTER — Ambulatory Visit
Admission: RE | Admit: 2023-08-21 | Discharge: 2023-08-21 | Disposition: A | Discharge status: Home / Self Care | Source: Ambulatory Visit | Attending: Radiation Oncology | Admitting: Radiation Oncology

## 2023-08-21 ENCOUNTER — Other Ambulatory Visit: Payer: Self-pay

## 2023-08-21 DIAGNOSIS — Z510 Encounter for antineoplastic radiation therapy: Secondary | ICD-10-CM | POA: Diagnosis not present

## 2023-08-21 LAB — RAD ONC ARIA SESSION SUMMARY
Course Elapsed Days: 13
Plan Fractions Treated to Date: 9
Plan Prescribed Dose Per Fraction: 2.66 Gy
Plan Total Fractions Prescribed: 16
Plan Total Prescribed Dose: 42.56 Gy
Reference Point Dosage Given to Date: 23.94 Gy
Reference Point Session Dosage Given: 2.66 Gy
Session Number: 9

## 2023-08-22 ENCOUNTER — Encounter

## 2023-08-22 ENCOUNTER — Ambulatory Visit
Admission: RE | Admit: 2023-08-22 | Discharge: 2023-08-22 | Disposition: A | Discharge status: Home / Self Care | Source: Ambulatory Visit | Attending: Radiation Oncology | Admitting: Radiation Oncology

## 2023-08-22 ENCOUNTER — Other Ambulatory Visit: Payer: Self-pay | Admitting: Nurse Practitioner

## 2023-08-22 ENCOUNTER — Other Ambulatory Visit: Payer: Self-pay

## 2023-08-22 DIAGNOSIS — Z510 Encounter for antineoplastic radiation therapy: Secondary | ICD-10-CM | POA: Diagnosis not present

## 2023-08-22 LAB — RAD ONC ARIA SESSION SUMMARY
Course Elapsed Days: 14
Plan Fractions Treated to Date: 10
Plan Prescribed Dose Per Fraction: 2.66 Gy
Plan Total Fractions Prescribed: 16
Plan Total Prescribed Dose: 42.56 Gy
Reference Point Dosage Given to Date: 26.6 Gy
Reference Point Session Dosage Given: 2.66 Gy
Session Number: 10

## 2023-08-23 ENCOUNTER — Ambulatory Visit
Admission: RE | Admit: 2023-08-23 | Discharge: 2023-08-23 | Disposition: A | Discharge status: Home / Self Care | Source: Ambulatory Visit | Attending: Radiation Oncology | Admitting: Radiation Oncology

## 2023-08-23 ENCOUNTER — Other Ambulatory Visit: Payer: Self-pay

## 2023-08-23 ENCOUNTER — Ambulatory Visit: Admitting: Radiation Oncology

## 2023-08-23 DIAGNOSIS — Z510 Encounter for antineoplastic radiation therapy: Secondary | ICD-10-CM | POA: Diagnosis not present

## 2023-08-23 LAB — RAD ONC ARIA SESSION SUMMARY
Course Elapsed Days: 15
Plan Fractions Treated to Date: 11
Plan Prescribed Dose Per Fraction: 2.66 Gy
Plan Total Fractions Prescribed: 16
Plan Total Prescribed Dose: 42.56 Gy
Reference Point Dosage Given to Date: 29.26 Gy
Reference Point Session Dosage Given: 2.66 Gy
Session Number: 11

## 2023-08-26 ENCOUNTER — Ambulatory Visit
Admission: RE | Admit: 2023-08-26 | Discharge: 2023-08-26 | Disposition: A | Discharge status: Home / Self Care | Source: Ambulatory Visit | Attending: Radiation Oncology | Admitting: Radiation Oncology

## 2023-08-26 ENCOUNTER — Other Ambulatory Visit: Payer: Self-pay

## 2023-08-26 DIAGNOSIS — Z510 Encounter for antineoplastic radiation therapy: Secondary | ICD-10-CM | POA: Diagnosis not present

## 2023-08-26 LAB — RAD ONC ARIA SESSION SUMMARY
Course Elapsed Days: 18
Plan Fractions Treated to Date: 12
Plan Prescribed Dose Per Fraction: 2.66 Gy
Plan Total Fractions Prescribed: 16
Plan Total Prescribed Dose: 42.56 Gy
Reference Point Dosage Given to Date: 31.92 Gy
Reference Point Session Dosage Given: 2.66 Gy
Session Number: 12

## 2023-08-27 ENCOUNTER — Ambulatory Visit
Admission: RE | Admit: 2023-08-27 | Discharge: 2023-08-27 | Disposition: A | Discharge status: Home / Self Care | Source: Ambulatory Visit | Attending: Radiation Oncology | Admitting: Radiation Oncology

## 2023-08-27 ENCOUNTER — Other Ambulatory Visit: Payer: Self-pay

## 2023-08-27 DIAGNOSIS — Z510 Encounter for antineoplastic radiation therapy: Secondary | ICD-10-CM | POA: Diagnosis not present

## 2023-08-27 LAB — RAD ONC ARIA SESSION SUMMARY
Course Elapsed Days: 19
Plan Fractions Treated to Date: 13
Plan Prescribed Dose Per Fraction: 2.66 Gy
Plan Total Fractions Prescribed: 16
Plan Total Prescribed Dose: 42.56 Gy
Reference Point Dosage Given to Date: 34.58 Gy
Reference Point Session Dosage Given: 2.66 Gy
Session Number: 13

## 2023-08-28 ENCOUNTER — Ambulatory Visit
Admission: RE | Admit: 2023-08-28 | Discharge: 2023-08-28 | Disposition: A | Discharge status: Home / Self Care | Source: Ambulatory Visit | Attending: Radiation Oncology

## 2023-08-28 ENCOUNTER — Other Ambulatory Visit: Payer: Self-pay

## 2023-08-28 DIAGNOSIS — Z510 Encounter for antineoplastic radiation therapy: Secondary | ICD-10-CM | POA: Diagnosis not present

## 2023-08-28 LAB — RAD ONC ARIA SESSION SUMMARY
Course Elapsed Days: 20
Plan Fractions Treated to Date: 14
Plan Prescribed Dose Per Fraction: 2.66 Gy
Plan Total Fractions Prescribed: 16
Plan Total Prescribed Dose: 42.56 Gy
Reference Point Dosage Given to Date: 37.24 Gy
Reference Point Session Dosage Given: 2.66 Gy
Session Number: 14

## 2023-08-29 ENCOUNTER — Ambulatory Visit

## 2023-08-30 ENCOUNTER — Other Ambulatory Visit: Payer: Self-pay

## 2023-08-30 ENCOUNTER — Ambulatory Visit

## 2023-08-30 ENCOUNTER — Ambulatory Visit
Admission: RE | Admit: 2023-08-30 | Discharge: 2023-08-30 | Disposition: A | Discharge status: Home / Self Care | Source: Ambulatory Visit | Attending: Radiation Oncology | Admitting: Radiation Oncology

## 2023-08-30 DIAGNOSIS — Z510 Encounter for antineoplastic radiation therapy: Secondary | ICD-10-CM | POA: Diagnosis not present

## 2023-08-30 LAB — RAD ONC ARIA SESSION SUMMARY
Course Elapsed Days: 22
Plan Fractions Treated to Date: 15
Plan Prescribed Dose Per Fraction: 2.66 Gy
Plan Total Fractions Prescribed: 16
Plan Total Prescribed Dose: 42.56 Gy
Reference Point Dosage Given to Date: 39.9 Gy
Reference Point Session Dosage Given: 2.66 Gy
Session Number: 15

## 2023-09-01 ENCOUNTER — Other Ambulatory Visit: Payer: Self-pay | Admitting: Family Medicine

## 2023-09-01 DIAGNOSIS — E1165 Type 2 diabetes mellitus with hyperglycemia: Secondary | ICD-10-CM

## 2023-09-02 ENCOUNTER — Other Ambulatory Visit: Payer: Self-pay

## 2023-09-02 ENCOUNTER — Ambulatory Visit
Admission: RE | Admit: 2023-09-02 | Discharge: 2023-09-02 | Discharge status: Home / Self Care | Source: Ambulatory Visit | Attending: Radiation Oncology

## 2023-09-02 ENCOUNTER — Ambulatory Visit

## 2023-09-02 DIAGNOSIS — Z510 Encounter for antineoplastic radiation therapy: Secondary | ICD-10-CM | POA: Diagnosis not present

## 2023-09-02 LAB — RAD ONC ARIA SESSION SUMMARY
Course Elapsed Days: 25
Plan Fractions Treated to Date: 16
Plan Prescribed Dose Per Fraction: 2.66 Gy
Plan Total Fractions Prescribed: 16
Plan Total Prescribed Dose: 42.56 Gy
Reference Point Dosage Given to Date: 42.56 Gy
Reference Point Session Dosage Given: 2.66 Gy
Session Number: 16

## 2023-09-03 ENCOUNTER — Ambulatory Visit

## 2023-09-03 ENCOUNTER — Ambulatory Visit
Admission: RE | Admit: 2023-09-03 | Discharge: 2023-09-03 | Disposition: A | Discharge status: Home / Self Care | Source: Ambulatory Visit | Attending: Radiation Oncology | Admitting: Radiation Oncology

## 2023-09-03 ENCOUNTER — Other Ambulatory Visit: Payer: Self-pay

## 2023-09-03 DIAGNOSIS — Z510 Encounter for antineoplastic radiation therapy: Secondary | ICD-10-CM | POA: Diagnosis not present

## 2023-09-03 LAB — RAD ONC ARIA SESSION SUMMARY
Course Elapsed Days: 26
Plan Fractions Treated to Date: 1
Plan Prescribed Dose Per Fraction: 2 Gy
Plan Total Fractions Prescribed: 4
Plan Total Prescribed Dose: 8 Gy
Reference Point Dosage Given to Date: 2 Gy
Reference Point Session Dosage Given: 2 Gy
Session Number: 17

## 2023-09-04 ENCOUNTER — Ambulatory Visit

## 2023-09-04 ENCOUNTER — Other Ambulatory Visit: Payer: Self-pay

## 2023-09-04 ENCOUNTER — Ambulatory Visit
Admission: RE | Admit: 2023-09-04 | Discharge: 2023-09-04 | Disposition: A | Discharge status: Home / Self Care | Source: Ambulatory Visit | Attending: Radiation Oncology | Admitting: Radiation Oncology

## 2023-09-04 DIAGNOSIS — Z510 Encounter for antineoplastic radiation therapy: Secondary | ICD-10-CM | POA: Diagnosis not present

## 2023-09-04 LAB — RAD ONC ARIA SESSION SUMMARY
Course Elapsed Days: 27
Plan Fractions Treated to Date: 2
Plan Prescribed Dose Per Fraction: 2 Gy
Plan Total Fractions Prescribed: 4
Plan Total Prescribed Dose: 8 Gy
Reference Point Dosage Given to Date: 4 Gy
Reference Point Session Dosage Given: 2 Gy
Session Number: 18

## 2023-09-05 ENCOUNTER — Ambulatory Visit

## 2023-09-05 ENCOUNTER — Ambulatory Visit
Admission: RE | Admit: 2023-09-05 | Discharge: 2023-09-05 | Disposition: A | Discharge status: Home / Self Care | Source: Ambulatory Visit | Attending: Radiation Oncology | Admitting: Radiation Oncology

## 2023-09-05 ENCOUNTER — Other Ambulatory Visit: Payer: Self-pay

## 2023-09-05 DIAGNOSIS — Z510 Encounter for antineoplastic radiation therapy: Secondary | ICD-10-CM | POA: Diagnosis not present

## 2023-09-05 LAB — RAD ONC ARIA SESSION SUMMARY
Course Elapsed Days: 28
Plan Fractions Treated to Date: 3
Plan Prescribed Dose Per Fraction: 2 Gy
Plan Total Fractions Prescribed: 4
Plan Total Prescribed Dose: 8 Gy
Reference Point Dosage Given to Date: 6 Gy
Reference Point Session Dosage Given: 2 Gy
Session Number: 19

## 2023-09-06 ENCOUNTER — Ambulatory Visit
Admission: RE | Admit: 2023-09-06 | Discharge: 2023-09-06 | Disposition: A | Discharge status: Home / Self Care | Source: Ambulatory Visit | Attending: Radiation Oncology | Admitting: Radiation Oncology

## 2023-09-06 ENCOUNTER — Other Ambulatory Visit (INDEPENDENT_AMBULATORY_CARE_PROVIDER_SITE_OTHER): Payer: Self-pay | Admitting: Otolaryngology

## 2023-09-06 ENCOUNTER — Other Ambulatory Visit: Payer: Self-pay

## 2023-09-06 ENCOUNTER — Ambulatory Visit
Admission: RE | Admit: 2023-09-06 | Discharge: 2023-09-06 | Disposition: A | Discharge status: Home / Self Care | Source: Ambulatory Visit | Attending: Radiation Oncology

## 2023-09-06 DIAGNOSIS — Z510 Encounter for antineoplastic radiation therapy: Secondary | ICD-10-CM | POA: Diagnosis not present

## 2023-09-06 LAB — RAD ONC ARIA SESSION SUMMARY
Course Elapsed Days: 29
Plan Fractions Treated to Date: 4
Plan Prescribed Dose Per Fraction: 2 Gy
Plan Total Fractions Prescribed: 4
Plan Total Prescribed Dose: 8 Gy
Reference Point Dosage Given to Date: 8 Gy
Reference Point Session Dosage Given: 2 Gy
Session Number: 20

## 2023-09-09 NOTE — Radiation Completion Notes (Addendum)
  Radiation Oncology         [phone] ________________________________  Name: Norma [name] MRN: [id number]  Date of Service: 09/06/2023  DOB: Mar 21, 1970  End of Treatment Note  Diagnosis:    Stage IA, [id number], grade 3, ER positive invasive ductal carcinoma of the left breast   Intent: Curative     ==========DELIVERED PLANS==========  First Treatment Date: 2023-08-08 Last Treatment Date: 2023-09-06   Plan Name: Breast_L_BH Site: Breast, Left Technique: 3D Mode: Photon Dose Per Fraction: 2.66 Gy Prescribed Dose (Delivered / Prescribed): 42.56 Gy / 42.56 Gy Prescribed Fxs (Delivered / Prescribed): 16 / 16   Plan Name: Brst_L_Bst_BH Site: Breast, Left Technique: 3D Mode: Photon Dose Per Fraction: 2 Gy Prescribed Dose (Delivered / Prescribed): 8 Gy / 8 Gy Prescribed Fxs (Delivered / Prescribed): 4 / 4     ==========ON TREATMENT VISIT DATES========== 2023-08-09, 2023-08-16, 2023-08-23, 2023-09-06    See weekly On Treatment Notes in Epic for details in the Media tab (listed as Progress notes on the On Treatment Visit Dates listed above). The patient tolerated radiation. She developed fatigue and anticipated skin changes in the treatment field.   The patient will receive a call in about one month from the radiation oncology department. She will continue follow up with Dr. Loretha as well.      Donald KYM Husband, PAC

## 2023-09-10 ENCOUNTER — Encounter: Payer: Self-pay | Admitting: Nurse Practitioner

## 2023-09-10 ENCOUNTER — Ambulatory Visit: Attending: Nurse Practitioner | Admitting: Nurse Practitioner

## 2023-09-10 VITALS — BP 137/84 | HR 86 | Resp 19 | Ht 66.0 in | Wt 256.4 lb

## 2023-09-10 DIAGNOSIS — E7800 Pure hypercholesterolemia, unspecified: Secondary | ICD-10-CM | POA: Diagnosis not present

## 2023-09-10 DIAGNOSIS — I10 Essential (primary) hypertension: Secondary | ICD-10-CM

## 2023-09-10 DIAGNOSIS — Z7984 Long term (current) use of oral hypoglycemic drugs: Secondary | ICD-10-CM

## 2023-09-10 DIAGNOSIS — E119 Type 2 diabetes mellitus without complications: Secondary | ICD-10-CM

## 2023-09-10 LAB — POCT GLYCOSYLATED HEMOGLOBIN (HGB A1C): Hemoglobin A1C: 7.5 % — AB (ref 4.0–5.6)

## 2023-09-10 MED ORDER — DEXCOM G7 SENSOR MISC
99 refills | Status: DC
Start: 2023-09-10 — End: 2024-01-27

## 2023-09-10 MED ORDER — GLIMEPIRIDE 1 MG PO TABS: 1.0000 mg | ORAL_TABLET | Freq: Every day | ORAL | 1 refills | Status: DC

## 2023-09-10 MED ORDER — DEXCOM G7 RECEIVER DEVI
99 refills | Status: DC
Start: 2023-09-10 — End: 2024-01-27

## 2023-09-10 MED ORDER — TELMISARTAN 80 MG PO TABS: 80.0000 mg | ORAL_TABLET | Freq: Every day | ORAL | 1 refills | Status: DC

## 2023-09-10 MED ORDER — EZETIMIBE 10 MG PO TABS: 10.0000 mg | ORAL_TABLET | Freq: Every day | ORAL | 1 refills | Status: DC

## 2023-09-10 NOTE — Progress Notes (Signed)
 Assessment & Plan:  Norma [name] was seen today for diabetes.  Diagnoses and all orders for this visit:  Diabetes mellitus treated with oral medication (HCC) -     POCT glycosylated hemoglobin (Hb A1C) -     glimepiride  (AMARYL ) 1 MG tablet; Take 1 tablet (1 mg total) by mouth daily with breakfast. -     Continuous Glucose Sensor (DEXCOM G7 SENSOR) MISC; Check blood glucose levels continuously E11.65 Z79.84 -     Continuous Glucose Receiver (DEXCOM G7 RECEIVER) DEVI; Check blood glucose levels continuously E11.65 Z79.84  Primary hypertension -     telmisartan  (MICARDIS ) 80 MG tablet; Take 1 tablet (80 mg total) by mouth daily.  Elevated LDL cholesterol level -     ezetimibe (ZETIA) 10 MG tablet; Take 1 tablet (10 mg total) by mouth daily.    Patient has been counseled on age-appropriate routine health concerns for screening and prevention. These are reviewed and up-to-date. Referrals have been placed accordingly. Immunizations are up-to-date or declined.    Subjective:   Chief Complaint  Patient presents with   Diabetes    Norma [name] 53 y.o. female presents to office today for follow up to DM and HTN  She has a past medical history of Allergy, Anemia, Anxiety, Arthritis, Asthma, Chronic headache, Colon polyps, DDD , thoracic, Depression, Diabetes mellitus, type 2,  Fibromyalgia, GERD, Goiter, Hyperlipidemia, Hypertension, Low back pain, Obesity, RA, Seasonal allergies, Seasonal depression, and Vitamin D  deficiency.     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 She originally was doing well other than some breast heaviness and radiation.  Currently taking lyrica for neuropathic pain in left nipple  DM 2 A1c slightly elevated. Weight is also up about 5 lbs. She  is currently taking glimepiride  1 mg daily. We will order dexcom today to see if this will help with improved dietary modifications.  Lab Results  Component Value Date   HGBA1C 7.5 (A) 09/10/2023    Lab Results  Component Value Date   HGBA1C 7.1 (H) 04/04/2023    HTN  Currently taking telmisartan  80 mg daily.  BP Readings from Last 3 Encounters:  09/10/23 137/84  07/31/23 135/82  07/02/23 (!) 140/74    Review of Systems  Constitutional:  Negative for fever, malaise/fatigue and weight loss.  HENT: Negative.  Negative for nosebleeds.   Eyes: Negative.  Negative for blurred vision, double vision and photophobia.  Respiratory: Negative.  Negative for cough and shortness of breath.   Cardiovascular: Negative.  Negative for chest pain, palpitations and leg swelling.  Gastrointestinal: Negative.  Negative for heartburn, nausea and vomiting.  Musculoskeletal: Negative.  Negative for myalgias.  Neurological:  Positive for tingling and sensory change. Negative for dizziness, focal weakness, seizures and headaches.  Psychiatric/Behavioral: Negative.  Negative for suicidal ideas.     Past Medical History:  Diagnosis Date   Allergy    Anemia    hx of   Anxiety    Arthritis    KNEES   Asthma    treated for during COVID tx   Cancer (HCC) 06/2023   left breast IDC   Chronic headache    Colon polyps    TUBULAR ADENOMAS AND HYPERPLASTIC    Complication of anesthesia    DDD (degenerative disc disease), thoracic  Depression    Diabetes mellitus, type 2 (HCC)    Difficult airway for intubation    Fibromyalgia    GERD (gastroesophageal reflux disease)    Goiter    Hyperlipidemia    Hypertension    Low back pain    Obesity    PONV (postoperative nausea and vomiting)    RA (rheumatoid arthritis) (HCC)    Seasonal allergies    Vitamin D  deficiency     Past Surgical History:  Procedure Laterality Date   ABDOMINAL HYSTERECTOMY  2008   BACK SURGERY  2005   lumbar laminectomy    BALLOON DILATION N/A 06/01/2021   Procedure: BALLOON DILATION;  Surgeon: Albertus Gordy HERO, MD;  Location: [facility];  Service: Gastroenterology;  Laterality: N/A;   BARTHOLIN GLAND CYST EXCISION     BIOPSY  06/01/2021   Procedure: BIOPSY;  Surgeon: Albertus Gordy HERO, MD;  Location: [facility];  Service: Gastroenterology;;   BREAST LUMPECTOMY WITH RADIOACTIVE SEED AND SENTINEL LYMPH NODE BIOPSY Left 07/02/2023   Procedure: BREAST LUMPECTOMY WITH RADIOACTIVE SEED AND SENTINEL LYMPH NODE BIOPSY;  Surgeon: Aron Shoulders, MD;  Location: [facility];  Service: General;  Laterality: Left;  90 MIN 78444- EXCISION LEFT CHEST WALL MASS GEN COMBINED WITH REGIONAL   COLONOSCOPY  2017   JMP-MAC-prep good-TA -recall 5 yr   COLONOSCOPY WITH PROPOFOL  N/A 06/01/2021   Procedure: COLONOSCOPY WITH PROPOFOL ;  Surgeon: Albertus Gordy HERO, MD;  Location: [facility];  Service: Gastroenterology;  Laterality: N/A;   ESOPHAGOGASTRODUODENOSCOPY (EGD) WITH PROPOFOL  N/A 06/01/2021   Procedure: ESOPHAGOGASTRODUODENOSCOPY (EGD) WITH PROPOFOL ;  Surgeon: Albertus Gordy HERO, MD;  Location: [facility];  Service: Gastroenterology;  Laterality: N/A;   LUMBAR LAMINECTOMY/DECOMPRESSION MICRODISCECTOMY Right 07/17/2013   Procedure: LUMBAR LAMINECTOMY/DECOMPRESSION MICRODISCECTOMY 1 LEVEL,RIGHT LUMBAR FOUR-FIVE;  Surgeon: Darina MALVA Boehringer, MD;  Location: [facility];  Service: Neurosurgery;  Laterality: Right;  Right   MASS EXCISION Left 07/02/2023   Procedure: EXCISION, MASS, CHEST WALL;  Surgeon: Aron Shoulders, MD;  Location: [facility];  Service: General;  Laterality: Left;   PARTIAL HYSTERECTOMY     POLYPECTOMY  2017   TA and benign polypoid   POLYPECTOMY  06/01/2021   Procedure: POLYPECTOMY;  Surgeon: Albertus Gordy HERO, MD;  Location: [facility];  Service: Gastroenterology;;   WISDOM TOOTH EXTRACTION      Family History  Problem Relation Age of Onset   Diabetes Mother    Aneurysm Mother    Diabetes  Father    Colon polyps Maternal Uncle    Liver disease Maternal Grandmother    Rheum arthritis Maternal Grandmother    Stroke Maternal Grandfather    Parkinson's disease Paternal Grandmother    Esophageal cancer Neg Hx    Colon cancer Neg Hx    Rectal cancer Neg Hx    Stomach cancer Neg Hx     Social History Reviewed with no changes to be made today.   Outpatient Medications Prior to Visit  Medication Sig Dispense Refill   aspirin 81 MG chewable tablet Chew by mouth daily.     Azelastine -Fluticasone  137-50 MCG/ACT SUSP Place 1 spray into the nose every 12 (twelve) hours. 23 g 3   ELDERBERRY PO Take 1 tablet by mouth daily. As needed     pantoprazole  (PROTONIX ) 20 MG tablet TAKE 1 TABLET BY MOUTH TWICE A DAY 180 tablet 1   polyethylene glycol powder (GLYCOLAX /MIRALAX ) powder Dissolve 17 grams in at least 8 ounces water /juice and drink once daily. 527  g 2   pregabalin (LYRICA) 25 MG capsule Take 25 mg by mouth 2 (two) times daily.     Probiotic Product (PROBIOTIC DAILY PO) Take 1 tablet by mouth daily. [facility]     valACYclovir (VALTREX) 500 MG tablet Take 1 tablet by mouth daily as needed (Flair up). As needed     ezetimibe (ZETIA) 10 MG tablet TAKE 1 TABLET BY MOUTH EVERY DAY 90 tablet 1   glimepiride  (AMARYL ) 1 MG tablet Take 1 tablet (1 mg total) by mouth daily with breakfast. Must have office visit for refills 90 tablet 0   telmisartan  (MICARDIS ) 80 MG tablet Take 1 tablet (80 mg total) by mouth daily. Please schedule appointment with Happy Begeman for more refills. 30 tablet 0   No facility-administered medications prior to visit.    Allergies  Allergen Reactions   Empagliflozin Other (See Comments)    Flank pain    Metformin Nausea Only   Semaglutide Other (See Comments)    Headache   Tizanidine Other (See Comments)    Causes nightmares       Objective:    BP 137/84 (BP Location: Left Arm, Patient Position: Sitting, Cuff Size: Normal)   Pulse 86   Resp 19   Ht 5' 6  (1.676 m)   Wt 256 lb 6.4 oz (116.3 kg)   SpO2 100%   BMI 41.38 kg/m  Wt Readings from Last 3 Encounters:  09/10/23 256 lb 6.4 oz (116.3 kg)  07/31/23 250 lb (113.4 kg)  07/02/23 250 lb 3.6 oz (113.5 kg)    Physical Exam Vitals and nursing note reviewed.  Constitutional:      Appearance: She is well-developed.  HENT:     Head: Normocephalic and atraumatic.   Cardiovascular:     Rate and Rhythm: Normal rate and regular rhythm.     Heart sounds: Normal heart sounds. No murmur heard.    No friction rub. No gallop.  Pulmonary:     Effort: Pulmonary effort is normal. No tachypnea or respiratory distress.     Breath sounds: Normal breath sounds. No decreased breath sounds, wheezing, rhonchi or rales.  Chest:     Chest wall: No tenderness.  Abdominal:     General: Bowel sounds are normal.     Palpations: Abdomen is soft.   Musculoskeletal:        General: Normal range of motion.     Cervical back: Normal range of motion.   Skin:    General: Skin is warm and dry.   Neurological:     Mental Status: She is alert and oriented to person, place, and time.     Coordination: Coordination normal.   Psychiatric:        Behavior: Behavior normal. Behavior is cooperative.        Thought Content: Thought content normal.        Judgment: Judgment normal.          Patient has been counseled extensively about nutrition and exercise as well as the importance of adherence with medications and regular follow-up. The patient was given clear instructions to go to ER or return to medical center if symptoms don't improve, worsen or new problems develop. The patient verbalized understanding.   Follow-up: Return in about 3 months (around 12/16/2023).   Haze LELON Servant, FNP-BC [facility] [place], [place] [phone]   09/10/2023, 6:35 PM

## 2023-10-01 ENCOUNTER — Ambulatory Visit: Attending: General Surgery | Admitting: Physical Therapy

## 2023-10-01 ENCOUNTER — Encounter: Payer: Self-pay | Admitting: Physical Therapy

## 2023-10-01 DIAGNOSIS — Z170 Estrogen receptor positive status [ER+]: Secondary | ICD-10-CM | POA: Diagnosis present

## 2023-10-01 DIAGNOSIS — Z483 Aftercare following surgery for neoplasm: Secondary | ICD-10-CM | POA: Diagnosis present

## 2023-10-01 DIAGNOSIS — R293 Abnormal posture: Secondary | ICD-10-CM | POA: Diagnosis present

## 2023-10-01 DIAGNOSIS — C50412 Malignant neoplasm of upper-outer quadrant of left female breast: Secondary | ICD-10-CM | POA: Diagnosis present

## 2023-10-01 DIAGNOSIS — M25612 Stiffness of left shoulder, not elsewhere classified: Secondary | ICD-10-CM | POA: Insufficient documentation

## 2023-10-01 DIAGNOSIS — R600 Localized edema: Secondary | ICD-10-CM | POA: Insufficient documentation

## 2023-10-01 DIAGNOSIS — I890 Lymphedema, not elsewhere classified: Secondary | ICD-10-CM | POA: Diagnosis present

## 2023-10-01 NOTE — Patient Instructions (Signed)
Self manual lymph drainage: Perform this sequence once a day.  Only give enough pressure no your skin to make the skin move.  Diaphragmatic - Supine   Inhale through nose making navel move out toward hands. Exhale through puckered lips, hands follow navel in. Repeat _5__ times. Rest _10__ seconds between repeats.   Copyright  VHI. All rights reserved.  Hug yourself.  Do circles at your neck just above your collarbones.  Repeat this 10 times.  Axilla - One at a Time   Using full weight of flat hand and fingers at center of uninvolved armpit, make _10__ in-place circles.   Copyright  VHI. All rights reserved.  LEG: Inguinal Nodes Stimulation   With small finger side of hand against hip crease on involved side, gently perform circles at the crease. Repeat __10_ times.   Copyright  VHI. All rights reserved.  Axilla to Inguinal Nodes - Sweep   On involved side, sweep _4__ times from armpit along side of trunk to hip crease.  Now gently stretch skin from the involved side to the uninvolved side across the chest at the shoulder line.  Repeat that 4 times.  Draw an imaginary diagonal line from upper outer breast through the nipple area toward lower inner breast.  Direct fluid upward and inward from this line toward the pathway across your upper chest .  Do this in three rows to treat all of the upper inner breast tissue, and do each row 3-4x.      Direct fluid to treat all of lower outer breast tissue downward and outward toward pathway that is aimed at the left groin.  Finish by doing the pathways as described above going from your involved armpit to the same side groin and going across your upper chest from the involved shoulder to the uninvolved shoulder.  Repeat the steps above where you do circles in your left groin and right armpit. Copyright  VHI. All rights reserved.

## 2023-10-01 NOTE — Therapy (Addendum)
 OUTPATIENT PHYSICAL THERAPY BREAST CANCER TREATMENT   Patient Name: Norma [name] MRN: [id number] DOB:1970-12-31, 53 y.o., female Today's Date: 10/01/2023  END OF SESSION:  PT End of Session - 10/01/23 1405     Visit Number 7    Number of Visits 15    Date for PT Re-Evaluation 10/29/23    PT Start Time 1402    PT Stop Time 1455    PT Time Calculation (min) 53 min    Activity Tolerance Patient tolerated treatment well    Behavior During Therapy [facility] for tasks assessed/performed           Past Medical History:  Diagnosis Date   Allergy    Anemia    hx of   Anxiety    Arthritis    KNEES   Asthma    treated for during COVID tx   Cancer (HCC) 06/2023   left breast IDC   Chronic headache    Colon polyps    TUBULAR ADENOMAS AND HYPERPLASTIC    Complication of anesthesia    DDD (degenerative disc disease), thoracic    Depression    Diabetes mellitus, type 2 (HCC)    Difficult airway for intubation    Fibromyalgia    GERD (gastroesophageal reflux disease)    Goiter    Hyperlipidemia    Hypertension    Low back pain    Obesity    PONV (postoperative nausea and vomiting)    RA (rheumatoid arthritis) (HCC)    Seasonal allergies    Vitamin D  deficiency    Past Surgical History:  Procedure Laterality Date   ABDOMINAL HYSTERECTOMY  2008   BACK SURGERY  2005   lumbar laminectomy   BALLOON DILATION N/A 06/01/2021   Procedure: BALLOON DILATION;  Surgeon: Albertus Gordy HERO, MD;  Location: [facility];  Service: Gastroenterology;  Laterality: N/A;   BARTHOLIN GLAND CYST EXCISION     BIOPSY  06/01/2021   Procedure: BIOPSY;  Surgeon: Albertus Gordy HERO, MD;  Location: [facility];  Service: Gastroenterology;;   BREAST LUMPECTOMY WITH RADIOACTIVE SEED AND SENTINEL LYMPH NODE BIOPSY Left 07/02/2023   Procedure: BREAST LUMPECTOMY WITH RADIOACTIVE SEED AND SENTINEL LYMPH NODE BIOPSY;  Surgeon: Aron Shoulders, MD;  Location: [facility];  Service: General;  Laterality:  Left;  90 MIN [id number]- EXCISION LEFT CHEST WALL MASS GEN COMBINED WITH REGIONAL   COLONOSCOPY  2017   JMP-MAC-prep good-TA -recall 5 yr   COLONOSCOPY WITH PROPOFOL  N/A 06/01/2021   Procedure: COLONOSCOPY WITH PROPOFOL ;  Surgeon: Albertus Gordy HERO, MD;  Location: [facility];  Service: Gastroenterology;  Laterality: N/A;   ESOPHAGOGASTRODUODENOSCOPY (EGD) WITH PROPOFOL  N/A 06/01/2021   Procedure: ESOPHAGOGASTRODUODENOSCOPY (EGD) WITH PROPOFOL ;  Surgeon: Albertus Gordy HERO, MD;  Location: [facility];  Service: Gastroenterology;  Laterality: N/A;   LUMBAR LAMINECTOMY/DECOMPRESSION MICRODISCECTOMY Right 07/17/2013   Procedure: LUMBAR LAMINECTOMY/DECOMPRESSION MICRODISCECTOMY 1 LEVEL,RIGHT LUMBAR FOUR-FIVE;  Surgeon: Darina MALVA Boehringer, MD;  Location: [facility];  Service: Neurosurgery;  Laterality: Right;  Right   MASS EXCISION Left 07/02/2023   Procedure: EXCISION, MASS, CHEST WALL;  Surgeon: Aron Shoulders, MD;  Location: [facility];  Service: General;  Laterality: Left;   PARTIAL HYSTERECTOMY     POLYPECTOMY  2017   TA and benign polypoid   POLYPECTOMY  06/01/2021   Procedure: POLYPECTOMY;  Surgeon: Albertus Gordy HERO, MD;  Location: [facility];  Service: Gastroenterology;;   WISDOM TOOTH EXTRACTION     Patient Active Problem List   Diagnosis  Date Noted   Genetic testing 06/23/2023   Malignant neoplasm of upper-outer quadrant of left breast in female, estrogen receptor positive (HCC) 06/11/2023   Gastritis without bleeding    Esophageal dysphagia    Schatzki's ring    Benign neoplasm of ascending colon    Class 3 severe obesity with serious comorbidity and body mass index (BMI) of 40.0 to 44.9 in adult 08/25/2019   Type 2 diabetes mellitus without complication, without long-term current use of insulin (HCC) 08/25/2019   Chronic cough 06/27/2017   LUQ abdominal pain 12/07/2014   RUQ abdominal pain 11/10/2013   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   Gastroesophageal reflux disease  12/18/2012   Hx of adenomatous colonic polyps 12/18/2012   Chronic RLQ pain 12/18/2012   IBS (irritable bowel syndrome) 01/22/2011   HTN (hypertension) 01/22/2011   Hyperlipidemia 01/22/2011   Anxiety and depression 01/22/2011    PCP: Haze Servant, NP  REFERRING PROVIDER: Dr. Jina Nephew   REFERRING DIAG: Left breast cancer  THERAPY DIAG:  Localized edema  Stiffness of left shoulder, not elsewhere classified  Aftercare following surgery for neoplasm  Abnormal posture  Malignant neoplasm of upper-outer quadrant of left breast in female, estrogen receptor positive (HCC)  Rationale for Evaluation and Treatment: Rehabilitation  ONSET DATE: 04/26/23  SUBJECTIVE:                                                                                                                                                                                           SUBJECTIVE STATEMENT: I finished radiation 3 weeks ago. I had an infection since I saw you. I also had to have my breast aspirated twice since I saw you. My incision re opened at the end of the targeted radiation.   PERTINENT HISTORY:  Patient was diagnosed on 04/26/2023 with left grade 3 invasive ductal carcinoma breast cancer. It measures 1 cm and is located in the upper outer quadrant. It is ER/PR positive and HER2 negative with a Ki67 of 30%. Pt underwent a L lumpectomy and SLNB 0/2 on 07/02/23  PATIENT GOALS:  Reassess how my recovery is going related to arm function, pain, and swelling.  PAIN:  Are you having pain? None - several hours ago had pin sensation in axilla  PRECAUTIONS: Recent Surgery, left UE Lymphedema risk,   RED FLAGS: None   ACTIVITY LEVEL / LEISURE: doing post op exercises   OBJECTIVE:   PATIENT SURVEYS:  QUICK DASH:     OBSERVATIONS: Healing scars with some increased scar tissue at L axilla  POSTURE:  Forward head and rounded shoulders posture   UPPER EXTREMITY AROM/PROM:   A/PROM  RIGHT    eval    Shoulder extension 48  Shoulder flexion 153  Shoulder abduction 165  Shoulder internal rotation 63  Shoulder external rotation 84                          (Blank rows = not tested)   A/PROM LEFT   eval LEFT  07/25/23 LEFT 08/08/23 LEFT 10/01/23  Shoulder extension 35 70    Shoulder flexion 150 162 171 176  Shoulder abduction 167 156 151 163  Shoulder internal rotation 63 69    Shoulder external rotation 75 80                            (Blank rows = not tested)   CERVICAL AROM: All within normal limits   UPPER EXTREMITY STRENGTH: WFL   LYMPHEDEMA ASSESSMENTS (in cm):    LANDMARK RIGHT   eval  10 cm proximal to olecranon process 38.8  Olecranon process 28.7  10 cm proximal to ulnar styloid process 25.8  Just proximal to ulnar styloid process 17  Across hand at thumb web space 20.5  At base of 2nd digit 5.9  (Blank rows = not tested)   LANDMARK LEFT   eval LEFT 07/25/23  10 cm proximal to olecranon process 40 38.4  Olecranon process 28.8 29.5  10 cm proximal to ulnar styloid process 24.3 23  Just proximal to ulnar styloid process 16.3 16.5  Across hand at thumb web space 19.5 20  At base of 2nd digit 5.9 5.8  (Blank rows = not tested)  Surgery type/Date: 07/02/23 L breast lumpectomy and SLNB Number of lymph nodes removed: 0/2 Current/past treatment (chemo, radiation, hormone therapy): does not need chemo, will require radiation, will need hormone therapy Other symptoms:  Heaviness/tightness Yes Pain Yes Pitting edema No Infections No Decreased scar mobility Yes Stemmer sign No   Junie Palin - 10/01/23 0001     Open a tight or new jar No difficulty    Do heavy household chores (wash walls, wash floors) No difficulty    Carry a shopping bag or briefcase No difficulty    Wash your back No difficulty    Use a knife to cut food No difficulty    Recreational activities in which you take some force or impact through your arm, shoulder, or hand (golf,  hammering, tennis) No difficulty    During the past week, to what extent has your arm, shoulder or hand problem interfered with your normal social activities with family, friends, neighbors, or groups? Slightly    During the past week, to what extent has your arm, shoulder or hand problem limited your work or other regular daily activities Slightly    Arm, shoulder, or hand pain. Mild    Tingling (pins and needles) in your arm, shoulder, or hand Moderate    Difficulty Sleeping Mild difficulty    DASH Score 13.64 %          TREATMENT PERFORMED: 10/01/23 Manual Therapy: In supine: Short neck, 5 diaphragmatic breaths, R axillary nodes and establishment of interaxillary pathway, L inguinal nodes and establishment of axilloinguinal pathway, then L breast moving fluid towards pathways and L axilla spending extra time in any areas of fibrosis then retracing all steps while educating pt in self MLD techniques and proper skin stretch technique   08/08/2023 Therapeutic Exercises Pulleys into flex and abd x 2 min 30 sec  each with VC's to decrease scapular compensation and relax shoulders Roll yellow ball up wall into flex and Lt UE abd x 10 each Supine  for following: bil UE scaption into a V x 10, flexion x 10, and then bil UE abd into a snow angel, bil UE flex x 10 Demonstrated door way stretch for pec stretch x 30 sec Manual Therapy MLD as follows for edema at L axilla: short neck, 5 diaphragmatic breaths, R axillary nodes, establishment of interaxillary pathway, L inguinal nodes and establishment of axillo inguinal pathway, L axilla moving fluid towards pathways then retracing steps. Educated pt in proper skin stretch technique and anatomy and physiology of the lymphatic system. Scar massage to SLNB scar    08/06/2023 Therapeutic Exercises Pulleys into flex and abd x 2 mins each with VC's to decrease scapular compensation and relax shoulders Roll yellow ball up wall into flex and Lt UE abd  x 10 each Supine  for following: Bil UE horz abd x 5, bil UE scaption into a V x 5, flexion x 5, and then bil UE abd into a snow angel, bil UE flex x 10 Lower trunk rotation with arms in abd x 4 ea Manual Therapy P/ROM to Lt shoulder into flex and abduction, scaption, IR and ER  Scar Tissue massage over axilla where palpable tightness over lymph node bed, and STM without cocoa butter to axillary border of pectorals     08/01/23: Therapeutic Exercises Pulleys into flex and abd x 2 mins each with VC's to decrease scapular compensation and relax shoulders Roll yellow ball up wall into flex and Lt UE abd x 10 each Therapeutic Activities Supine (attempted on 1/2 foam roll but pt had vertigo) for following: Bil UE horz abd x 10, bil UE scaption into a V x 10, and then bil UE abd into a snow angel, bil UE flex x 10 Manual Therapy P/ROM to Lt shoulder into flex and abduction with pt demonstrating full ROM Scar Tissue massage over axilla where palpable tightness over lymph node bed and to scar where lipoma was removed as well as lumpectomy scar    07/29/23: Therapeutic Exercises Pulleys into flex and abd x 2 mins each with VC's to decrease scapular compensation and relax shoulders Roll yellow ball up wall into flex and Lt UE abd x 10 each Therapeutic Activities Supine over half foam roll for following: Bil UE horz abd x 10, bil UE scaption into a V x 10, and then bil UE abd into a snow angel Manual Therapy P/ROM to Lt shoulder into flex, abd, and D2 with scapular depression throughout by therapist Scar Tissue massage over axilla where palpable tightness over lymph node bed, began instructing pt in scar tissue massage but advised her to ask Dr. Aron if ok to begin this when she sees her this week.  Cut and issued 1/4 gray foam in TG soft for pt to wear at bra band where rubbing incision.   07/25/23: Pulleys x 2 min in direction of abduction and in direction of flexion with pt  returning therapist demo and feeling decreased discomfort in axilla by the end Instructed pt in supine dowel exercises as follows with pt returning therapist demo: L shoulder abduction x 5 reps with 3 sec holds, bilateral shoulder flexion x 10 reps with 3 sec holds  PATIENT EDUCATION:  Access Code: [email or web address] URL: [email or web address] Date: 08/06/2023 Prepared by: Grayce Sheldon  Exercises - Single Arm Doorway Pec Stretch at 90 Degrees Abduction  -  2 x daily - 7 x weekly - 1 sets - 3 reps - 20-30 hold - Supine Lower Trunk Rotation  - 1 x daily - 7 x weekly - 1 sets - 3 reps - 20-30 hold Education details: nerve desensitization, supine dowel exercises, lymphedema risk reduction and importance of skin care Person educated: Patient Education method: Explanation, Demonstration, Tactile cues, and Handouts Education comprehension: verbalized understanding and returned demonstration  HOME EXERCISE PROGRAM: Reviewed previously given post op HEP. Supine dowel exercises in to abduction and flexion  ASSESSMENT:  CLINICAL IMPRESSION: Pt returns to PT after completing radiation for treatment of L breast cancer. She developed a seroma since she was here last and has had to have it drained twice. She reports there is still some fluid present and there is also mild lymphedema in L breast post radiation. She would benefit from skilled PT services to decrease L breast swelling and instruct pt in self MLD.   Pt will benefit from skilled therapeutic intervention to improve on the following deficits: Decreased knowledge of precautions, impaired UE functional use, pain, decreased ROM, postural dysfunction, increased edema.   PT treatment/interventions: ADL/Self care home management, [phone]- PT Re-evaluation, 97110-Therapeutic exercises, 97530- Therapeutic activity, [id number]- Neuromuscular re-education, 97535- Self Care, [id number]- Manual therapy, [id number]- Orthotic Initial, and [id number]- Orthotic/Prosthetic  subsequent   GOALS: Goals reviewed with patient? Yes  LONG TERM GOALS:  (STG=LTG)  GOALS Name Target Date  Goal status  1 Pt will demonstrate she has regained full shoulder ROM and function post operatively compared to baselines.  Baseline: 08/22/23 MET  2 Pt will report no pain at end range of L shoulder abduction to allow improved function. 08/22/23 MET  3 Pt will be independent in a home exercise program for continued stretching and strengthening.  08/22/23 MET  4 Pt will be independent in self MLD for long term management of L breast lymphedema and swelling in L axilla to decrease risk of infection. 10/29/23 NEW     PLAN:  PT FREQUENCY/DURATION: 2x/wk for 4 wks  PLAN FOR NEXT SESSION: continue to instruct in self MLD for L breast and axilla and assess indep   [facility]  [address], [address]  [place] [address]  [phone]  Self manual lymph drainage: Do circles behind the collar bones x 10  Perform this sequence once a day.  Only give enough pressure no your skin to make the skin move.  Diaphragmatic - Supine   Inhale through nose making navel move out toward hands. Exhale through puckered lips, hands follow navel in. Repeat _5__ times. Rest _10__ seconds between repeats.   Copyright  VHI. All rights reserved.  Hug yourself.  Do circles at your neck just above your collarbones.  Repeat this 10 times.  Axilla - One at a Time   Using full weight of flat hand and fingers at center of uninvolved (R) armpit, make _10__ in-place circles.   Copyright  VHI. All rights reserved.  LEG: Inguinal Nodes Stimulation   With small finger side of hand against hip crease on involved (L) side, gently perform circles at the crease. Repeat __10_ times.   Copyright  VHI. All rights reserved.  Axilla to Inguinal Nodes - Sweep   On involved side, stretch skin from armpit along side of trunk to hip crease.  Now gently stretch skin from the  involved side to the uninvolved side across the chest at the shoulder line.    Move fluid from L armpit  in area that feels swollen towards these 2 pathways by gently stretching skin.   Finish by doing circles in R armpit and L groin.   Finish by doing the pathways as described above going from your involved armpit to the same side groin and going across your upper chest from the involved shoulder to the uninvolved shoulder.  Repeat the steps above where you do circles in your left groin and right armpit. Copyright  VHI. All rights reserved.    [facility] [place], PT 10/01/2023, 2:58 PM

## 2023-10-03 ENCOUNTER — Encounter: Payer: Self-pay | Admitting: Physical Therapy

## 2023-10-03 ENCOUNTER — Ambulatory Visit: Admitting: Physical Therapy

## 2023-10-03 DIAGNOSIS — I890 Lymphedema, not elsewhere classified: Secondary | ICD-10-CM

## 2023-10-03 DIAGNOSIS — Z483 Aftercare following surgery for neoplasm: Secondary | ICD-10-CM

## 2023-10-03 DIAGNOSIS — R293 Abnormal posture: Secondary | ICD-10-CM

## 2023-10-03 DIAGNOSIS — Z170 Estrogen receptor positive status [ER+]: Secondary | ICD-10-CM

## 2023-10-03 DIAGNOSIS — R600 Localized edema: Secondary | ICD-10-CM

## 2023-10-03 DIAGNOSIS — M25612 Stiffness of left shoulder, not elsewhere classified: Secondary | ICD-10-CM

## 2023-10-03 NOTE — Therapy (Signed)
 OUTPATIENT PHYSICAL THERAPY BREAST CANCER TREATMENT   Patient Name: Norma [name] MRN: [id number] DOB:03-Nov-1970, 53 y.o., female Today's Date: 10/03/2023  END OF SESSION:  PT End of Session - 10/03/23 1602     Visit Number 8    Number of Visits 15    Date for PT Re-Evaluation 10/29/23    PT Start Time 1502    PT Stop Time 1601    PT Time Calculation (min) 59 min    Activity Tolerance Patient tolerated treatment well    Behavior During Therapy [facility] for tasks assessed/performed           Past Medical History:  Diagnosis Date   Allergy    Anemia    hx of   Anxiety    Arthritis    KNEES   Asthma    treated for during COVID tx   Cancer (HCC) 06/2023   left breast IDC   Chronic headache    Colon polyps    TUBULAR ADENOMAS AND HYPERPLASTIC    Complication of anesthesia    DDD (degenerative disc disease), thoracic    Depression    Diabetes mellitus, type 2 (HCC)    Difficult airway for intubation    Fibromyalgia    GERD (gastroesophageal reflux disease)    Goiter    Hyperlipidemia    Hypertension    Low back pain    Obesity    PONV (postoperative nausea and vomiting)    RA (rheumatoid arthritis) (HCC)    Seasonal allergies    Vitamin D  deficiency    Past Surgical History:  Procedure Laterality Date   ABDOMINAL HYSTERECTOMY  2008   BACK SURGERY  2005   lumbar laminectomy   BALLOON DILATION N/A 06/01/2021   Procedure: BALLOON DILATION;  Surgeon: Albertus Gordy HERO, MD;  Location: [facility];  Service: Gastroenterology;  Laterality: N/A;   BARTHOLIN GLAND CYST EXCISION     BIOPSY  06/01/2021   Procedure: BIOPSY;  Surgeon: Albertus Gordy HERO, MD;  Location: [facility];  Service: Gastroenterology;;   BREAST LUMPECTOMY WITH RADIOACTIVE SEED AND SENTINEL LYMPH NODE BIOPSY Left 07/02/2023   Procedure: BREAST LUMPECTOMY WITH RADIOACTIVE SEED AND SENTINEL LYMPH NODE BIOPSY;  Surgeon: Aron Shoulders, MD;  Location: [facility];  Service: General;  Laterality:  Left;  90 MIN 78444- EXCISION LEFT CHEST WALL MASS GEN COMBINED WITH REGIONAL   COLONOSCOPY  2017   JMP-MAC-prep good-TA -recall 5 yr   COLONOSCOPY WITH PROPOFOL  N/A 06/01/2021   Procedure: COLONOSCOPY WITH PROPOFOL ;  Surgeon: Albertus Gordy HERO, MD;  Location: [facility];  Service: Gastroenterology;  Laterality: N/A;   ESOPHAGOGASTRODUODENOSCOPY (EGD) WITH PROPOFOL  N/A 06/01/2021   Procedure: ESOPHAGOGASTRODUODENOSCOPY (EGD) WITH PROPOFOL ;  Surgeon: Albertus Gordy HERO, MD;  Location: [facility];  Service: Gastroenterology;  Laterality: N/A;   LUMBAR LAMINECTOMY/DECOMPRESSION MICRODISCECTOMY Right 07/17/2013   Procedure: LUMBAR LAMINECTOMY/DECOMPRESSION MICRODISCECTOMY 1 LEVEL,RIGHT LUMBAR FOUR-FIVE;  Surgeon: Darina MALVA Boehringer, MD;  Location: [facility];  Service: Neurosurgery;  Laterality: Right;  Right   MASS EXCISION Left 07/02/2023   Procedure: EXCISION, MASS, CHEST WALL;  Surgeon: Aron Shoulders, MD;  Location: [facility];  Service: General;  Laterality: Left;   PARTIAL HYSTERECTOMY     POLYPECTOMY  2017   TA and benign polypoid   POLYPECTOMY  06/01/2021   Procedure: POLYPECTOMY;  Surgeon: Albertus Gordy HERO, MD;  Location: [facility];  Service: Gastroenterology;;   WISDOM TOOTH EXTRACTION     Patient Active Problem List   Diagnosis  Date Noted   Genetic testing 06/23/2023   Malignant neoplasm of upper-outer quadrant of left breast in female, estrogen receptor positive (HCC) 06/11/2023   Gastritis without bleeding    Esophageal dysphagia    Schatzki's ring    Benign neoplasm of ascending colon    Class 3 severe obesity with serious comorbidity and body mass index (BMI) of 40.0 to 44.9 in adult 08/25/2019   Type 2 diabetes mellitus without complication, without long-term current use of insulin (HCC) 08/25/2019   Chronic cough 06/27/2017   LUQ abdominal pain 12/07/2014   RUQ abdominal pain 11/10/2013   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   Gastroesophageal reflux disease  12/18/2012   Hx of adenomatous colonic polyps 12/18/2012   Chronic RLQ pain 12/18/2012   IBS (irritable bowel syndrome) 01/22/2011   HTN (hypertension) 01/22/2011   Hyperlipidemia 01/22/2011   Anxiety and depression 01/22/2011    PCP: Haze Servant, NP  REFERRING PROVIDER: Dr. Jina Nephew   REFERRING DIAG: Left breast cancer  THERAPY DIAG:  Lymphedema, not elsewhere classified  Localized edema  Stiffness of left shoulder, not elsewhere classified  Aftercare following surgery for neoplasm  Abnormal posture  Malignant neoplasm of upper-outer quadrant of left breast in female, estrogen receptor positive (HCC)  Rationale for Evaluation and Treatment: Rehabilitation  ONSET DATE: 04/26/23  SUBJECTIVE:                                                                                                                                                                                           SUBJECTIVE STATEMENT: I have been practicing the self massage. I don't know if I am doing it right.   PERTINENT HISTORY:  Patient was diagnosed on 04/26/2023 with left grade 3 invasive ductal carcinoma breast cancer. It measures 1 cm and is located in the upper outer quadrant. It is ER/PR positive and HER2 negative with a Ki67 of 30%. Pt underwent a L lumpectomy and SLNB 0/2 on 07/02/23  PATIENT GOALS:  Reassess how my recovery is going related to arm function, pain, and swelling.  PAIN:  Are you having pain? None -  PRECAUTIONS: Recent Surgery, left UE Lymphedema risk,   RED FLAGS: None   ACTIVITY LEVEL / LEISURE: doing post op exercises   OBJECTIVE:   PATIENT SURVEYS:  QUICK DASH:     OBSERVATIONS: Healing scars with some increased scar tissue at L axilla  POSTURE:  Forward head and rounded shoulders posture   UPPER EXTREMITY AROM/PROM:   A/PROM RIGHT   eval    Shoulder extension 48  Shoulder flexion 153  Shoulder abduction 165  Shoulder internal rotation 63  Shoulder  external rotation 84                          (Blank rows = not tested)   A/PROM LEFT   eval LEFT  07/25/23 LEFT 08/08/23 LEFT 10/01/23  Shoulder extension 35 70    Shoulder flexion 150 162 171 176  Shoulder abduction 167 156 151 163  Shoulder internal rotation 63 69    Shoulder external rotation 75 80                            (Blank rows = not tested)   CERVICAL AROM: All within normal limits   UPPER EXTREMITY STRENGTH: WFL   LYMPHEDEMA ASSESSMENTS (in cm):    LANDMARK RIGHT   eval  10 cm proximal to olecranon process 38.8  Olecranon process 28.7  10 cm proximal to ulnar styloid process 25.8  Just proximal to ulnar styloid process 17  Across hand at thumb web space 20.5  At base of 2nd digit 5.9  (Blank rows = not tested)   LANDMARK LEFT   eval LEFT 07/25/23  10 cm proximal to olecranon process 40 38.4  Olecranon process 28.8 29.5  10 cm proximal to ulnar styloid process 24.3 23  Just proximal to ulnar styloid process 16.3 16.5  Across hand at thumb web space 19.5 20  At base of 2nd digit 5.9 5.8  (Blank rows = not tested)  Surgery type/Date: 07/02/23 L breast lumpectomy and SLNB Number of lymph nodes removed: 0/2 Current/past treatment (chemo, radiation, hormone therapy): does not need chemo, will require radiation, will need hormone therapy Other symptoms:  Heaviness/tightness Yes Pain Yes Pitting edema No Infections No Decreased scar mobility Yes Stemmer sign No     TREATMENT PERFORMED: 10/03/23 Manual Therapy: In supine: Short neck, 5 diaphragmatic breaths, R axillary nodes and establishment of interaxillary pathway, L inguinal nodes and establishment of axilloinguinal pathway, then L breast moving fluid towards pathways and L axilla spending extra time in any areas of fibrosis then retracing all steps while educating pt in self MLD techniques and proper skin stretch technique. Had pt return demonstrate each step and provided v/c and t/c for correct skin  stretch.  STM in R sidelying to L serratus using cocoa butter with decreased tightness noted by end of session  10/01/23 Manual Therapy: In supine: Short neck, 5 diaphragmatic breaths, R axillary nodes and establishment of interaxillary pathway, L inguinal nodes and establishment of axilloinguinal pathway, then L breast moving fluid towards pathways and L axilla spending extra time in any areas of fibrosis then retracing all steps while educating pt in self MLD techniques and proper skin stretch technique   08/08/2023 Therapeutic Exercises Pulleys into flex and abd x 2 min 30 sec each with VC's to decrease scapular compensation and relax shoulders Roll yellow ball up wall into flex and Lt UE abd x 10 each Supine  for following: bil UE scaption into a V x 10, flexion x 10, and then bil UE abd into a snow angel, bil UE flex x 10 Demonstrated door way stretch for pec stretch x 30 sec Manual Therapy MLD as follows for edema at L axilla: short neck, 5 diaphragmatic breaths, R axillary nodes, establishment of interaxillary pathway, L inguinal nodes and establishment of axillo inguinal pathway, L axilla moving fluid towards pathways then retracing steps. Educated pt in proper skin stretch technique and anatomy and physiology of  the lymphatic system. Scar massage to SLNB scar    08/06/2023 Therapeutic Exercises Pulleys into flex and abd x 2 mins each with VC's to decrease scapular compensation and relax shoulders Roll yellow ball up wall into flex and Lt UE abd x 10 each Supine  for following: Bil UE horz abd x 5, bil UE scaption into a V x 5, flexion x 5, and then bil UE abd into a snow angel, bil UE flex x 10 Lower trunk rotation with arms in abd x 4 ea Manual Therapy P/ROM to Lt shoulder into flex and abduction, scaption, IR and ER  Scar Tissue massage over axilla where palpable tightness over lymph node bed, and STM without cocoa butter to axillary border of  pectorals     08/01/23: Therapeutic Exercises Pulleys into flex and abd x 2 mins each with VC's to decrease scapular compensation and relax shoulders Roll yellow ball up wall into flex and Lt UE abd x 10 each Therapeutic Activities Supine (attempted on 1/2 foam roll but pt had vertigo) for following: Bil UE horz abd x 10, bil UE scaption into a V x 10, and then bil UE abd into a snow angel, bil UE flex x 10 Manual Therapy P/ROM to Lt shoulder into flex and abduction with pt demonstrating full ROM Scar Tissue massage over axilla where palpable tightness over lymph node bed and to scar where lipoma was removed as well as lumpectomy scar    07/29/23: Therapeutic Exercises Pulleys into flex and abd x 2 mins each with VC's to decrease scapular compensation and relax shoulders Roll yellow ball up wall into flex and Lt UE abd x 10 each Therapeutic Activities Supine over half foam roll for following: Bil UE horz abd x 10, bil UE scaption into a V x 10, and then bil UE abd into a snow angel Manual Therapy P/ROM to Lt shoulder into flex, abd, and D2 with scapular depression throughout by therapist Scar Tissue massage over axilla where palpable tightness over lymph node bed, began instructing pt in scar tissue massage but advised her to ask Dr. Aron if ok to begin this when she sees her this week.  Cut and issued 1/4 gray foam in TG soft for pt to wear at bra band where rubbing incision.   07/25/23: Pulleys x 2 min in direction of abduction and in direction of flexion with pt returning therapist demo and feeling decreased discomfort in axilla by the end Instructed pt in supine dowel exercises as follows with pt returning therapist demo: L shoulder abduction x 5 reps with 3 sec holds, bilateral shoulder flexion x 10 reps with 3 sec holds  PATIENT EDUCATION:  Access Code: [email or web address] URL: [email or web address] Date: 08/06/2023 Prepared by: Grayce Sheldon  Exercises - Single  Arm Doorway Pec Stretch at 90 Degrees Abduction  - 2 x daily - 7 x weekly - 1 sets - 3 reps - 20-30 hold - Supine Lower Trunk Rotation  - 1 x daily - 7 x weekly - 1 sets - 3 reps - 20-30 hold Education details: nerve desensitization, supine dowel exercises, lymphedema risk reduction and importance of skin care Person educated: Patient Education method: Explanation, Demonstration, Tactile cues, and Handouts Education comprehension: verbalized understanding and returned demonstration  HOME EXERCISE PROGRAM: Reviewed previously given post op HEP. Supine dowel exercises in to abduction and flexion  ASSESSMENT:  CLINICAL IMPRESSION: Instructed pt in self MLD today and had her return demonstrate each step. Added soft tissue mobilization to L  serratus to help decrease pain and tightness with improvement noted by end of session.   Pt will benefit from skilled therapeutic intervention to improve on the following deficits: Decreased knowledge of precautions, impaired UE functional use, pain, decreased ROM, postural dysfunction, increased edema.   PT treatment/interventions: ADL/Self care home management, [id number]- PT Re-evaluation, 97110-Therapeutic exercises, 97530- Therapeutic activity, [id number]- Neuromuscular re-education, 97535- Self Care, 02859- Manual therapy, [id number]- Orthotic Initial, and [id number]- Orthotic/Prosthetic subsequent   GOALS: Goals reviewed with patient? Yes  LONG TERM GOALS:  (STG=LTG)  GOALS Name Target Date  Goal status  1 Pt will demonstrate she has regained full shoulder ROM and function post operatively compared to baselines.  Baseline: 08/22/23 MET  2 Pt will report no pain at end range of L shoulder abduction to allow improved function. 08/22/23 MET  3 Pt will be independent in a home exercise program for continued stretching and strengthening.  08/22/23 MET  4 Pt will be independent in self MLD for long term management of L breast lymphedema and swelling in L axilla to decrease  risk of infection. 10/29/23 NEW     PLAN:  PT FREQUENCY/DURATION: 2x/wk for 4 wks  PLAN FOR NEXT SESSION: continue to instruct in self MLD for L breast and axilla and assess indep, STM to L serratus   [facility]  [address], [address]  [place] [address]  [phone]  Self manual lymph drainage: Do circles behind the collar bones x 10  Perform this sequence once a day.  Only give enough pressure no your skin to make the skin move.  Diaphragmatic - Supine   Inhale through nose making navel move out toward hands. Exhale through puckered lips, hands follow navel in. Repeat _5__ times. Rest _10__ seconds between repeats.   Copyright  VHI. All rights reserved.  Hug yourself.  Do circles at your neck just above your collarbones.  Repeat this 10 times.  Axilla - One at a Time   Using full weight of flat hand and fingers at center of uninvolved (R) armpit, make _10__ in-place circles.   Copyright  VHI. All rights reserved.  LEG: Inguinal Nodes Stimulation   With small finger side of hand against hip crease on involved (L) side, gently perform circles at the crease. Repeat __10_ times.   Copyright  VHI. All rights reserved.  Axilla to Inguinal Nodes - Sweep   On involved side, stretch skin from armpit along side of trunk to hip crease.  Now gently stretch skin from the involved side to the uninvolved side across the chest at the shoulder line.    Move fluid from L armpit in area that feels swollen towards these 2 pathways by gently stretching skin.   Finish by doing circles in R armpit and L groin.   Finish by doing the pathways as described above going from your involved armpit to the same side groin and going across your upper chest from the involved shoulder to the uninvolved shoulder.  Repeat the steps above where you do circles in your left groin and right armpit. Copyright  VHI. All rights reserved.    [facility] [place],  PT 10/03/2023, 5:07 PM

## 2023-10-04 ENCOUNTER — Telehealth: Payer: Self-pay

## 2023-10-04 NOTE — Progress Notes (Signed)
  Radiation Oncology         [phone] ________________________________  Name: Norma [name] MRN: [id number]  Date of Service: 10/07/2023  DOB: Nov 23, 1970  Post Treatment Telephone Note  Diagnosis:  Stage IA, [id number], grade 3, ER positive invasive ductal carcinoma of the left breast (as documented in provider EOT note)  The patient was not available for call today. Unable to reach patient for today's appointment. 2 calls. No answer. Message was left w/ my extension [phone]. Call complete.  The patient was encouraged to avoid sun exposure in the area of prior treatment for up to one year following radiation with either sunscreen or by the style of clothing worn in the sun.  The patient has scheduled follow up with her medical oncologist Dr. Loretha for ongoing surveillance, and was encouraged to call if she develops concerns or questions regarding radiation.  This concludes the interaction.  Rosaline Minerva, LPN

## 2023-10-07 ENCOUNTER — Inpatient Hospital Stay: Attending: Hematology and Oncology | Admitting: Hematology and Oncology

## 2023-10-07 ENCOUNTER — Ambulatory Visit
Admission: RE | Admit: 2023-10-07 | Discharge: 2023-10-07 | Disposition: A | Discharge status: Home / Self Care | Source: Ambulatory Visit | Attending: Hematology and Oncology | Admitting: Hematology and Oncology

## 2023-10-07 ENCOUNTER — Encounter: Payer: Self-pay | Admitting: Hematology and Oncology

## 2023-10-07 VITALS — BP 138/55 | HR 89 | Temp 98.8°F | Resp 17 | Wt 253.3 lb

## 2023-10-07 DIAGNOSIS — Z83719 Family history of colon polyps, unspecified: Secondary | ICD-10-CM | POA: Diagnosis not present

## 2023-10-07 DIAGNOSIS — C50412 Malignant neoplasm of upper-outer quadrant of left female breast: Secondary | ICD-10-CM | POA: Insufficient documentation

## 2023-10-07 DIAGNOSIS — Z170 Estrogen receptor positive status [ER+]: Secondary | ICD-10-CM | POA: Diagnosis not present

## 2023-10-07 DIAGNOSIS — Z1732 Human epidermal growth factor receptor 2 negative status: Secondary | ICD-10-CM | POA: Insufficient documentation

## 2023-10-07 DIAGNOSIS — N951 Menopausal and female climacteric states: Secondary | ICD-10-CM | POA: Insufficient documentation

## 2023-10-07 DIAGNOSIS — Z1721 Progesterone receptor positive status: Secondary | ICD-10-CM | POA: Diagnosis not present

## 2023-10-07 DIAGNOSIS — Z79899 Other long term (current) drug therapy: Secondary | ICD-10-CM | POA: Insufficient documentation

## 2023-10-07 DIAGNOSIS — Z79811 Long term (current) use of aromatase inhibitors: Secondary | ICD-10-CM | POA: Insufficient documentation

## 2023-10-07 DIAGNOSIS — G4700 Insomnia, unspecified: Secondary | ICD-10-CM | POA: Insufficient documentation

## 2023-10-07 DIAGNOSIS — I890 Lymphedema, not elsewhere classified: Secondary | ICD-10-CM | POA: Diagnosis not present

## 2023-10-07 MED ORDER — ANASTROZOLE 1 MG PO TABS: 1.0000 mg | ORAL_TABLET | Freq: Every day | ORAL | 3 refills | Status: AC

## 2023-10-07 NOTE — Progress Notes (Signed)
 Norma [name]  Patient Care Team: Theotis Haze ORN, NP as PCP - General (Nurse Practitioner) Darlean Ozell NOVAK, MD as Consulting Physician (Pulmonary Disease) [facility] Tyree Nanetta SAILOR, RN as Oncology Nurse Navigator Glean, Stephane BROCKS, RN (Inactive) as Oncology Nurse Navigator Aron Shoulders, MD as Consulting Physician (General Surgery) Loretha Ash, MD as Consulting Physician (Hematology and Oncology) Dewey Rush, MD as Consulting Physician (Radiation Oncology)  CHIEF COMPLAINTS/PURPOSE OF CONSULTATION:  New diagnosis of left breast IDC.  ASSESSMENT & PLAN:  No problem-specific Assessment & Plan notes found for this encounter.  Assessment and Plan Assessment & Plan Breast cancer Estrogen receptor-positive s/p surgery and adj radiation.  Prefers to avoid tamoxifen due to clot risk. - Prescribe anastrozole  or letrozole once daily. - Order bone density test for baseline. - Consider Guardant Reveal blood test.  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 Undergoing therapy and massage.  Scar tissue in breast Palpable hard scar tissue from treatment. Expected to soften. - Monitor for changes.  Menopausal symptoms Hot flashes and insomnia.  - Monitor side effects and adjust treatment.   Orders Placed This Encounter  Procedures   DG Bone Density    Standing Status:   Future    Expected Date:   10/21/2023    Expiration Date:   10/06/2024    Reason for Exam (SYMPTOM  OR DIAGNOSIS REQUIRED):   screening for osteoporosis    Preferred imaging location?:   [facility]     [name] OF PRESENTING ILLNESS:  Norma [name] 53 y.o. female is here because of left breast IDC  Oncology [name]  Malignant neoplasm of upper-outer quadrant of left breast in female, estrogen receptor positive (HCC)  04/25/2023 Mammogram   Mass in the left breast central to nipple anterior depth. Additional imaging showed 1 * 0.8*0.7 cm  irregular enlarging mass in the left breast at 3 0 clock anterior depth suspicious of malignancy. US  guided biopsy is recommended.   06/04/2023 Pathology Results   Left breast needle core biopsy showed grade 3 IDC, ER 100% positive, PR 70% strong staining,  Her 2 0, Ki 67 30%   06/11/2023 Initial Diagnosis   Malignant neoplasm of upper-outer quadrant of left breast in female, estrogen receptor positive (HCC)   06/21/2023 Genetic Testing   Negative genetic testing on the CancerNext-Expanded+RNAinsight panel.  The report date is June 21, 2023.  The CancerNext-Expanded gene panel offered by [facility] and includes sequencing, rearrangement, and RNA analysis for the following 76 genes: AIP, ALK, APC, ATM, AXIN2, BAP1, BARD1, BMPR1A, BRCA1, BRCA2, BRIP1, CDC73, CDH1, CDK4, CDKN1B, CDKN2A, CEBPA, CHEK2, CTNNA1, DDX41, DICER1, ETV6, FH, FLCN, GATA2, LZTR1, MAX, MBD4, MEN1, MET, MLH1, MSH2, MSH3, MSH6, MUTYH, NF1, NF2, NTHL1, PALB2, PHOX2B, PMS2, POT1, PRKAR1A, PTCH1, PTEN, RAD51C, RAD51D, RB1, RET, RUNX1, SDHA, SDHAF2, SDHB, SDHC, SDHD, SMAD4, SMARCA4, SMARCB1, SMARCE1, STK11, SUFU, TMEM127, TP53, TSC1, TSC2, VHL, and WT1 (sequencing and deletion/duplication); EGFR, HOXB13, KIT, MITF, PDGFRA, POLD1, and POLE (sequencing only); EPCAM and GREM1 (deletion/duplication only).      Discussed the use of AI scribe software for clinical [name] transcription with the patient, who gave verbal consent to proceed.  [name] of Present Illness Norma [name] is a 52 year old female with breast cancer who presents for follow-up after radiation therapy. She was referred by Dr. Aron for follow-up care and management of post-treatment symptoms.  She has experienced significant improvement in healing post-radiation therapy and is eager for further progress. She has  resumed physical therapy to address inflammation in her arm and breast. She practices lymphedema massage between therapy sessions.  During self-massage, she  noticed a hard spot in her breast. The spot is more palpable when lying down and has been aspirated three times in the past. She is concerned about the size and presence of fluid.  She is experiencing menopausal symptoms, including hot flashes and sleepless nights, which have improved over time. She is currently not on any medication for these symptoms.  She expresses concern about the possibility of cancer returning, especially after hearing about a peer's experience with recurrence after stopping therapy. Norma [name]:  Past Norma [name]:  Diagnosis Date   Allergy    Anemia    hx of   Anxiety    Arthritis    KNEES   Asthma    treated for during COVID tx   Cancer (HCC) 06/2023   left breast IDC   Chronic headache    Colon polyps    TUBULAR ADENOMAS AND HYPERPLASTIC    Complication of anesthesia    DDD (degenerative disc disease), thoracic    Depression    Diabetes mellitus, type 2 (HCC)    Difficult airway for intubation    Fibromyalgia    GERD (gastroesophageal reflux disease)    Goiter    Hyperlipidemia    Hypertension    Low back pain    Obesity    PONV (postoperative nausea and vomiting)    RA (rheumatoid arthritis) (HCC)    Seasonal allergies    Vitamin D  deficiency     SURGICAL [name]: Past Surgical [name]:  Procedure Laterality Date   ABDOMINAL HYSTERECTOMY  2008   BACK SURGERY  2005   lumbar laminectomy   BALLOON DILATION N/A 06/01/2021   Procedure: BALLOON DILATION;  Surgeon: Albertus Gordy HERO, MD;  Location: [facility];  Service: Gastroenterology;  Laterality: N/A;   BARTHOLIN GLAND CYST EXCISION     BIOPSY  06/01/2021   Procedure: BIOPSY;  Surgeon: Albertus Gordy HERO, MD;  Location: [facility];  Service: Gastroenterology;;   BREAST LUMPECTOMY WITH RADIOACTIVE SEED AND SENTINEL LYMPH NODE BIOPSY Left 07/02/2023   Procedure: BREAST LUMPECTOMY WITH RADIOACTIVE SEED AND SENTINEL LYMPH NODE BIOPSY;  Surgeon: Aron Shoulders, MD;  Location: [facility];  Service: General;  Laterality: Left;  90 MIN [id number]- EXCISION LEFT CHEST WALL MASS GEN COMBINED WITH REGIONAL   COLONOSCOPY  2017   JMP-MAC-prep good-TA -recall 5 yr   COLONOSCOPY WITH PROPOFOL  N/A 06/01/2021   Procedure: COLONOSCOPY WITH PROPOFOL ;  Surgeon: Albertus Gordy HERO, MD;  Location: [facility];  Service: Gastroenterology;  Laterality: N/A;   ESOPHAGOGASTRODUODENOSCOPY (EGD) WITH PROPOFOL  N/A 06/01/2021   Procedure: ESOPHAGOGASTRODUODENOSCOPY (EGD) WITH PROPOFOL ;  Surgeon: Albertus Gordy HERO, MD;  Location: [facility];  Service: Gastroenterology;  Laterality: N/A;   LUMBAR LAMINECTOMY/DECOMPRESSION MICRODISCECTOMY Right 07/17/2013   Procedure: LUMBAR LAMINECTOMY/DECOMPRESSION MICRODISCECTOMY 1 LEVEL,RIGHT LUMBAR FOUR-FIVE;  Surgeon: Darina MALVA Boehringer, MD;  Location: [facility];  Service: Neurosurgery;  Laterality: Right;  Right   MASS EXCISION Left 07/02/2023   Procedure: EXCISION, MASS, CHEST WALL;  Surgeon: Aron Shoulders, MD;  Location: [facility];  Service: General;  Laterality: Left;   PARTIAL HYSTERECTOMY     POLYPECTOMY  2017   TA and benign polypoid   POLYPECTOMY  06/01/2021   Procedure: POLYPECTOMY;  Surgeon: Albertus Gordy HERO, MD;  Location: [facility];  Service: Gastroenterology;;   WISDOM TOOTH EXTRACTION      SOCIAL [name]:  Social [name]   Socioeconomic [name]   Marital status: Single    Spouse name: Not on file   Number of children: 1   Years of education: Not on file   Highest education level: Associate degree: academic program  Occupational [name]   Occupation: Tax adviser: [employer]  Tobacco Use   Smoking status: Never   Smokeless tobacco: Never  Vaping Use   Vaping status: Never Used  Substance and Sexual Activity   Alcohol use: Yes    Comment: occ   Drug use: Never   Sexual activity: Not Currently    Birth control/protection: Surgical    Comment: hyst  Other Topics Concern   Not on file  Social [name] Narrative    Lives at home. Her son lives with her.    Right handed   Caffeine: few times a year   Social Drivers of Corporate investment banker Strain: Low Risk  (09/06/2023)   Overall Financial Resource Strain (CARDIA)    Difficulty of Paying Living Expenses: Not hard at all  Food Insecurity: No Food Insecurity (09/06/2023)   Hunger Vital Sign    Worried About Running Out of Food in the Last Year: Never true    Ran Out of Food in the Last Year: Never true  Transportation Needs: No Transportation Needs (09/06/2023)   PRAPARE - Administrator, Civil Service (Norma): No    Lack of Transportation (Non-Norma): No  Physical Activity: Insufficiently Active (09/06/2023)   Exercise Vital Sign    Days of Exercise per Week: 1 day    Minutes of Exercise per Session: 10 min  Stress: No Stress Concern Present (09/06/2023)   [employer] of Occupational Health - Occupational Stress Questionnaire    Feeling of Stress: Only a little  Social Connections: Moderately Isolated (09/06/2023)   Social Connection and Isolation Panel    Frequency of Communication with Friends and Family: More than three times a week    Frequency of Social Gatherings with Friends and Family: Once a week    Attends Religious Services: 1 to 4 times per year    Active Member of [employer] or Organizations: No    Attends Engineer, structural: Not on file    Marital Status: Never married  Intimate Partner Violence: Not At Risk (07/31/2023)   Humiliation, Afraid, Rape, and Kick questionnaire    Fear of Current or Ex-Partner: No    Emotionally Abused: No    Physically Abused: No    Sexually Abused: No    FAMILY [name]: Family [name]  Problem Relation Age of Onset   Diabetes Mother    Aneurysm Mother    Diabetes Father    Colon polyps Maternal Uncle    Liver disease Maternal Grandmother    Rheum arthritis Maternal Grandmother    Stroke Maternal Grandfather    Parkinson's disease Paternal Grandmother     Esophageal cancer Neg Hx    Colon cancer Neg Hx    Rectal cancer Neg Hx    Stomach cancer Neg Hx     ALLERGIES:  is allergic to empagliflozin, metformin, semaglutide, and tizanidine.  MEDICATIONS:  Current Outpatient Medications  Medication Sig Dispense Refill   anastrozole  (ARIMIDEX ) 1 MG tablet Take 1 tablet (1 mg total) by mouth daily. 90 tablet 3   aspirin 81 MG chewable tablet Chew by mouth daily.     Azelastine -Fluticasone  137-50 MCG/ACT SUSP Place 1 spray into the nose every 12 (twelve) hours. 23  g 3   Continuous Glucose Receiver (DEXCOM G7 RECEIVER) DEVI Check blood glucose levels continuously E11.65 Z79.84 1 each PRN   Continuous Glucose Sensor (DEXCOM G7 SENSOR) MISC Check blood glucose levels continuously E11.65 Z79.84 2 each PRN   ELDERBERRY PO Take 1 tablet by mouth daily. As needed     ezetimibe  (ZETIA ) 10 MG tablet Take 1 tablet (10 mg total) by mouth daily. 90 tablet 1   glimepiride  (AMARYL ) 1 MG tablet Take 1 tablet (1 mg total) by mouth daily with breakfast. 90 tablet 1   pantoprazole  (PROTONIX ) 20 MG tablet TAKE 1 TABLET BY MOUTH TWICE A DAY 180 tablet 1   polyethylene glycol powder (GLYCOLAX /MIRALAX ) powder Dissolve 17 grams in at least 8 ounces water /juice and drink once daily. 527 g 2   pregabalin (LYRICA) 25 MG capsule Take 25 mg by mouth 2 (two) times daily.     Probiotic Product (PROBIOTIC DAILY PO) Take 1 tablet by mouth daily. Woman's Care     valACYclovir (VALTREX) 500 MG tablet Take 1 tablet by mouth daily as needed (Flair up). As needed     telmisartan  (MICARDIS ) 80 MG tablet Take 1 tablet (80 mg total) by mouth daily. 90 tablet 1   No current facility-administered medications for this visit.     PHYSICAL EXAMINATION: ECOG PERFORMANCE STATUS: 0 - Asymptomatic  Vitals:   10/07/23 0920  BP: (!) 138/55  Pulse: 89  Resp: 17  Temp: 98.8 F (37.1 C)  SpO2: 99%    Filed Weights   10/07/23 0920  Weight: 253 lb 4.8 oz (114.9 kg)     GENERAL:alert,  no distress and comfortable Left breast with post treatment changes. No concern for recurrence.  LABORATORY DATA:  I have reviewed the data as listed Lab Results  Component Value Date   WBC 5.5 06/12/2023   HGB 12.3 06/12/2023   HCT 38.1 06/12/2023   MCV 85.6 06/12/2023   PLT 205 06/12/2023     Chemistry      Component Value Date/Time   NA 139 06/12/2023 1157   NA 140 04/04/2023 0839   K 4.1 06/12/2023 1157   CL 104 06/12/2023 1157   CO2 30 06/12/2023 1157   BUN 14 06/12/2023 1157   BUN 18 04/04/2023 0839   CREATININE 0.86 06/12/2023 1157      Component Value Date/Time   CALCIUM 9.4 06/12/2023 1157   ALKPHOS 63 06/12/2023 1157   AST 23 06/12/2023 1157   ALT 30 06/12/2023 1157   BILITOT 0.4 06/12/2023 1157       RADIOGRAPHIC STUDIES: I have personally reviewed the radiological images as listed and agreed with the findings in the report. No results found.  All questions were answered. The patient knows to call the clinic with any problems, questions or concerns. I spent 30 minutes in the care of this patient including H and P, review of records, counseling and coordination of care.     Amber Stalls, MD 10/07/2023 9:44 AM

## 2023-10-08 ENCOUNTER — Ambulatory Visit

## 2023-10-08 DIAGNOSIS — I890 Lymphedema, not elsewhere classified: Secondary | ICD-10-CM | POA: Diagnosis not present

## 2023-10-08 DIAGNOSIS — Z483 Aftercare following surgery for neoplasm: Secondary | ICD-10-CM

## 2023-10-08 DIAGNOSIS — M25612 Stiffness of left shoulder, not elsewhere classified: Secondary | ICD-10-CM

## 2023-10-08 DIAGNOSIS — C50412 Malignant neoplasm of upper-outer quadrant of left female breast: Secondary | ICD-10-CM

## 2023-10-08 DIAGNOSIS — R293 Abnormal posture: Secondary | ICD-10-CM

## 2023-10-08 DIAGNOSIS — R600 Localized edema: Secondary | ICD-10-CM

## 2023-10-08 NOTE — Therapy (Signed)
 OUTPATIENT PHYSICAL THERAPY BREAST CANCER TREATMENT   Patient Name: Norma [name] MRN: [id number] DOB:01/07/1971, 53 y.o., female Today's Date: 10/08/2023  END OF SESSION:  PT End of Session - 10/08/23 0905     Visit Number 9    Number of Visits 15    Date for PT Re-Evaluation 10/29/23    PT Start Time 0902    PT Stop Time 1001    PT Time Calculation (min) 59 min    Activity Tolerance Patient tolerated treatment well    Behavior During Therapy [facility] for tasks assessed/performed           Past Medical History:  Diagnosis Date   Allergy    Anemia    hx of   Anxiety    Arthritis    KNEES   Asthma    treated for during COVID tx   Cancer (HCC) 06/2023   left breast IDC   Chronic headache    Colon polyps    TUBULAR ADENOMAS AND HYPERPLASTIC    Complication of anesthesia    DDD (degenerative disc disease), thoracic    Depression    Diabetes mellitus, type 2 (HCC)    Difficult airway for intubation    Fibromyalgia    GERD (gastroesophageal reflux disease)    Goiter    Hyperlipidemia    Hypertension    Low back pain    Obesity    PONV (postoperative nausea and vomiting)    RA (rheumatoid arthritis) (HCC)    Seasonal allergies    Vitamin D  deficiency    Past Surgical History:  Procedure Laterality Date   ABDOMINAL HYSTERECTOMY  2008   BACK SURGERY  2005   lumbar laminectomy   BALLOON DILATION N/A 06/01/2021   Procedure: BALLOON DILATION;  Surgeon: Albertus Gordy HERO, MD;  Location: [facility];  Service: Gastroenterology;  Laterality: N/A;   BARTHOLIN GLAND CYST EXCISION     BIOPSY  06/01/2021   Procedure: BIOPSY;  Surgeon: Albertus Gordy HERO, MD;  Location: [facility];  Service: Gastroenterology;;   BREAST LUMPECTOMY WITH RADIOACTIVE SEED AND SENTINEL LYMPH NODE BIOPSY Left 07/02/2023   Procedure: BREAST LUMPECTOMY WITH RADIOACTIVE SEED AND SENTINEL LYMPH NODE BIOPSY;  Surgeon: Aron Shoulders, MD;  Location: [facility];  Service: General;  Laterality:  Left;  90 MIN 78444- EXCISION LEFT CHEST WALL MASS GEN COMBINED WITH REGIONAL   COLONOSCOPY  2017   JMP-MAC-prep good-TA -recall 5 yr   COLONOSCOPY WITH PROPOFOL  N/A 06/01/2021   Procedure: COLONOSCOPY WITH PROPOFOL ;  Surgeon: Albertus Gordy HERO, MD;  Location: [facility];  Service: Gastroenterology;  Laterality: N/A;   ESOPHAGOGASTRODUODENOSCOPY (EGD) WITH PROPOFOL  N/A 06/01/2021   Procedure: ESOPHAGOGASTRODUODENOSCOPY (EGD) WITH PROPOFOL ;  Surgeon: Albertus Gordy HERO, MD;  Location: [facility];  Service: Gastroenterology;  Laterality: N/A;   LUMBAR LAMINECTOMY/DECOMPRESSION MICRODISCECTOMY Right 07/17/2013   Procedure: LUMBAR LAMINECTOMY/DECOMPRESSION MICRODISCECTOMY 1 LEVEL,RIGHT LUMBAR FOUR-FIVE;  Surgeon: Darina MALVA Boehringer, MD;  Location: [facility];  Service: Neurosurgery;  Laterality: Right;  Right   MASS EXCISION Left 07/02/2023   Procedure: EXCISION, MASS, CHEST WALL;  Surgeon: Aron Shoulders, MD;  Location: [facility];  Service: General;  Laterality: Left;   PARTIAL HYSTERECTOMY     POLYPECTOMY  2017   TA and benign polypoid   POLYPECTOMY  06/01/2021   Procedure: POLYPECTOMY;  Surgeon: Albertus Gordy HERO, MD;  Location: [facility];  Service: Gastroenterology;;   WISDOM TOOTH EXTRACTION     Patient Active Problem List   Diagnosis  Date Noted   Genetic testing 06/23/2023   Malignant neoplasm of upper-outer quadrant of left breast in female, estrogen receptor positive (HCC) 06/11/2023   Gastritis without bleeding    Esophageal dysphagia    Schatzki's ring    Benign neoplasm of ascending colon    Class 3 severe obesity with serious comorbidity and body mass index (BMI) of 40.0 to 44.9 in adult 08/25/2019   Type 2 diabetes mellitus without complication, without long-term current use of insulin (HCC) 08/25/2019   Chronic cough 06/27/2017   LUQ abdominal pain 12/07/2014   RUQ abdominal pain 11/10/2013   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   Gastroesophageal reflux disease  12/18/2012   Hx of adenomatous colonic polyps 12/18/2012   Chronic RLQ pain 12/18/2012   IBS (irritable bowel syndrome) 01/22/2011   HTN (hypertension) 01/22/2011   Hyperlipidemia 01/22/2011   Anxiety and depression 01/22/2011    PCP: Haze Servant, NP  REFERRING PROVIDER: Dr. Jina Nephew   REFERRING DIAG: Left breast cancer  THERAPY DIAG:  Lymphedema, not elsewhere classified  Localized edema  Stiffness of left shoulder, not elsewhere classified  Aftercare following surgery for neoplasm  Abnormal posture  Malignant neoplasm of upper-outer quadrant of left breast in female, estrogen receptor positive (HCC)  Rationale for Evaluation and Treatment: Rehabilitation  ONSET DATE: 04/26/23  SUBJECTIVE:                                                                                                                                                                                           SUBJECTIVE STATEMENT: I started noticing a firmness at the bottom of my breast and I'm hoping I don't need it drained again. The last time it was drained was about 5 weeks ago.   PERTINENT HISTORY:  Patient was diagnosed on 04/26/2023 with left grade 3 invasive ductal carcinoma breast cancer. It measures 1 cm and is located in the upper outer quadrant. It is ER/PR positive and HER2 negative with a Ki67 of 30%. Pt underwent a L lumpectomy and SLNB 0/2 on 07/02/23  PATIENT GOALS:  Reassess how my recovery is going related to arm function, pain, and swelling.  PAIN:  Are you having pain? None -  PRECAUTIONS: Recent Surgery, left UE Lymphedema risk,   RED FLAGS: None   ACTIVITY LEVEL / LEISURE: doing post op exercises   OBJECTIVE:   PATIENT SURVEYS:  QUICK DASH:     OBSERVATIONS: Healing scars with some increased scar tissue at L axilla  POSTURE:  Forward head and rounded shoulders posture   UPPER EXTREMITY AROM/PROM:   A/PROM RIGHT   eval    Shoulder extension 48  Shoulder  flexion 153  Shoulder abduction 165  Shoulder internal rotation 63  Shoulder external rotation 84                          (Blank rows = not tested)   A/PROM LEFT   eval LEFT  07/25/23 LEFT 08/08/23 LEFT 10/01/23  Shoulder extension 35 70    Shoulder flexion 150 162 171 176  Shoulder abduction 167 156 151 163  Shoulder internal rotation 63 69    Shoulder external rotation 75 80                            (Blank rows = not tested)   CERVICAL AROM: All within normal limits   UPPER EXTREMITY STRENGTH: WFL   LYMPHEDEMA ASSESSMENTS (in cm):    LANDMARK RIGHT   eval  10 cm proximal to olecranon process 38.8  Olecranon process 28.7  10 cm proximal to ulnar styloid process 25.8  Just proximal to ulnar styloid process 17  Across hand at thumb web space 20.5  At base of 2nd digit 5.9  (Blank rows = not tested)   LANDMARK LEFT   eval LEFT 07/25/23  10 cm proximal to olecranon process 40 38.4  Olecranon process 28.8 29.5  10 cm proximal to ulnar styloid process 24.3 23  Just proximal to ulnar styloid process 16.3 16.5  Across hand at thumb web space 19.5 20  At base of 2nd digit 5.9 5.8  (Blank rows = not tested)  Surgery type/Date: 07/02/23 L breast lumpectomy and SLNB Number of lymph nodes removed: 0/2 Current/past treatment (chemo, radiation, hormone therapy): does not need chemo, will require radiation, will need hormone therapy Other symptoms:  Heaviness/tightness Yes Pain Yes Pitting edema No Infections No Decreased scar mobility Yes Stemmer sign No     TREATMENT PERFORMED: 10/08/23: Manual Therapy: MLD to Lt breast in supine: Short neck, superficial and deep abdominals, Rt axillary and pectoral nodes and establishment of anterior inter-axillary pathway, Lt inguinal nodes and establishment of axillo-inguinal pathway, then Lt breast moving fluid towards pathways and L axilla spending extra time in any areas of fibrosis then into Rt S/L for further focus to latera and  inferior breast redirecting towards lateral anastomosis, then back in supine for retracing all steps  STM when in Rt sidelying to Lt serratus and medial scap border using cocoa butter P/ROM to Lt shoulder into flex, abd and D2 with scapular depression by therapist throughout  10/03/23 Manual Therapy: In supine: Short neck, 5 diaphragmatic breaths, R axillary nodes and establishment of interaxillary pathway, L inguinal nodes and establishment of axilloinguinal pathway, then L breast moving fluid towards pathways and L axilla spending extra time in any areas of fibrosis then retracing all steps while educating pt in self MLD techniques and proper skin stretch technique. Had pt return demonstrate each step and provided v/c and t/c for correct skin stretch.  STM in R sidelying to L serratus using cocoa butter with decreased tightness noted by end of session  10/01/23 Manual Therapy: In supine: Short neck, 5 diaphragmatic breaths, R axillary nodes and establishment of interaxillary pathway, L inguinal nodes and establishment of axilloinguinal pathway, then L breast moving fluid towards pathways and L axilla spending extra time in any areas of fibrosis then retracing all steps while educating pt in self MLD techniques and proper skin stretch technique      PATIENT EDUCATION:  Access Code: [email or web address] URL: [email or web address] Date: 08/06/2023 Prepared by: Grayce Sheldon  Exercises - Single Arm Doorway Pec Stretch at 90 Degrees Abduction  - 2 x daily - 7 x weekly - 1 sets - 3 reps - 20-30 hold - Supine Lower Trunk Rotation  - 1 x daily - 7 x weekly - 1 sets - 3 reps - 20-30 hold Education details: nerve desensitization, supine dowel exercises, lymphedema risk reduction and importance of skin care Person educated: Patient Education method: Explanation, Demonstration, Tactile cues, and Handouts Education comprehension: verbalized understanding and returned demonstration  HOME EXERCISE  PROGRAM: Reviewed previously given post op HEP. Supine dowel exercises in to abduction and flexion  ASSESSMENT:  CLINICAL IMPRESSION: Continued with MLD to Lt breast and incorporated STM to tight lateral trunk though this improved as pt isn't ttp here any longer. Also STM to medial scap border where tightness palpable. Spent extra time on area of fibrosis that pt noticed at inferolateral breast. Pt states this is also area of multiple aspirations so probable to also be some scar tissue.   Pt will benefit from skilled therapeutic intervention to improve on the following deficits: Decreased knowledge of precautions, impaired UE functional use, pain, decreased ROM, postural dysfunction, increased edema.   PT treatment/interventions: ADL/Self care home management, [phone]- PT Re-evaluation, 97110-Therapeutic exercises, 97530- Therapeutic activity, [id number]- Neuromuscular re-education, 97535- Self Care, 02859- Manual therapy, [id number]- Orthotic Initial, and [id number]- Orthotic/Prosthetic subsequent   GOALS: Goals reviewed with patient? Yes  LONG TERM GOALS:  (STG=LTG)  GOALS Name Target Date  Goal status  1 Pt will demonstrate she has regained full shoulder ROM and function post operatively compared to baselines.  Baseline: 08/22/23 MET  2 Pt will report no pain at end range of L shoulder abduction to allow improved function. 08/22/23 MET  3 Pt will be independent in a home exercise program for continued stretching and strengthening.  08/22/23 MET  4 Pt will be independent in self MLD for long term management of L breast lymphedema and swelling in L axilla to decrease risk of infection. 10/29/23 NEW     PLAN:  PT FREQUENCY/DURATION: 2x/wk for 4 wks  PLAN FOR NEXT SESSION: continue to instruct in self MLD for L breast and axilla and assess indep, STM to L serratus   [facility]  [address], [address]  [place] [address]  [phone]  Self manual lymph drainage: Do  circles behind the collar bones x 10  Perform this sequence once a day.  Only give enough pressure no your skin to make the skin move.  Diaphragmatic - Supine   Inhale through nose making navel move out toward hands. Exhale through puckered lips, hands follow navel in. Repeat _5__ times. Rest _10__ seconds between repeats.   Copyright  VHI. All rights reserved.  Hug yourself.  Do circles at your neck just above your collarbones.  Repeat this 10 times.  Axilla - One at a Time   Using full weight of flat hand and fingers at center of uninvolved (R) armpit, make _10__ in-place circles.   Copyright  VHI. All rights reserved.  LEG: Inguinal Nodes Stimulation   With small finger side of hand against hip crease on involved (L) side, gently perform circles at the crease. Repeat __10_ times.   Copyright  VHI. All rights reserved.  Axilla to Inguinal Nodes - Sweep   On involved side, stretch skin from armpit along side of trunk to hip crease.  Now gently stretch  skin from the involved side to the uninvolved side across the chest at the shoulder line.    Move fluid from L armpit in area that feels swollen towards these 2 pathways by gently stretching skin.   Finish by doing circles in R armpit and L groin.   Finish by doing the pathways as described above going from your involved armpit to the same side groin and going across your upper chest from the involved shoulder to the uninvolved shoulder.  Repeat the steps above where you do circles in your left groin and right armpit. Copyright  VHI. All rights reserved.    Aden Berwyn Caldron, PTA 10/08/2023, 10:07 AM

## 2023-10-10 ENCOUNTER — Ambulatory Visit

## 2023-10-10 ENCOUNTER — Ambulatory Visit (INDEPENDENT_AMBULATORY_CARE_PROVIDER_SITE_OTHER): Admitting: Otolaryngology

## 2023-10-10 ENCOUNTER — Encounter: Payer: Self-pay | Admitting: Nurse Practitioner

## 2023-10-10 ENCOUNTER — Other Ambulatory Visit: Payer: Self-pay | Admitting: Nurse Practitioner

## 2023-10-10 DIAGNOSIS — I890 Lymphedema, not elsewhere classified: Secondary | ICD-10-CM | POA: Diagnosis not present

## 2023-10-10 DIAGNOSIS — E119 Type 2 diabetes mellitus without complications: Secondary | ICD-10-CM

## 2023-10-10 DIAGNOSIS — R293 Abnormal posture: Secondary | ICD-10-CM

## 2023-10-10 DIAGNOSIS — R600 Localized edema: Secondary | ICD-10-CM

## 2023-10-10 DIAGNOSIS — Z483 Aftercare following surgery for neoplasm: Secondary | ICD-10-CM

## 2023-10-10 DIAGNOSIS — I10 Essential (primary) hypertension: Secondary | ICD-10-CM

## 2023-10-10 DIAGNOSIS — M25612 Stiffness of left shoulder, not elsewhere classified: Secondary | ICD-10-CM

## 2023-10-10 DIAGNOSIS — C50412 Malignant neoplasm of upper-outer quadrant of left female breast: Secondary | ICD-10-CM

## 2023-10-10 NOTE — Therapy (Signed)
 OUTPATIENT PHYSICAL THERAPY BREAST CANCER TREATMENT   Patient Name: Norma [name] MRN: [id number] DOB:04/25/1970, 53 y.o., female Today's Date: 10/10/2023  END OF SESSION:  PT End of Session - 10/10/23 0900     Visit Number 10    Number of Visits 15    Date for PT Re-Evaluation 10/29/23    PT Start Time 0901    PT Stop Time 0958    PT Time Calculation (min) 57 min    Activity Tolerance Patient tolerated treatment well    Behavior During Therapy [facility] for tasks assessed/performed           Past Medical History:  Diagnosis Date   Allergy    Anemia    hx of   Anxiety    Arthritis    KNEES   Asthma    treated for during COVID tx   Cancer (HCC) 06/2023   left breast IDC   Chronic headache    Colon polyps    TUBULAR ADENOMAS AND HYPERPLASTIC    Complication of anesthesia    DDD (degenerative disc disease), thoracic    Depression    Diabetes mellitus, type 2 (HCC)    Difficult airway for intubation    Fibromyalgia    GERD (gastroesophageal reflux disease)    Goiter    Hyperlipidemia    Hypertension    Low back pain    Obesity    PONV (postoperative nausea and vomiting)    RA (rheumatoid arthritis) (HCC)    Seasonal allergies    Vitamin D  deficiency    Past Surgical History:  Procedure Laterality Date   ABDOMINAL HYSTERECTOMY  2008   BACK SURGERY  2005   lumbar laminectomy   BALLOON DILATION N/A 06/01/2021   Procedure: BALLOON DILATION;  Surgeon: Albertus Gordy HERO, MD;  Location: [facility];  Service: Gastroenterology;  Laterality: N/A;   BARTHOLIN GLAND CYST EXCISION     BIOPSY  06/01/2021   Procedure: BIOPSY;  Surgeon: Albertus Gordy HERO, MD;  Location: [facility];  Service: Gastroenterology;;   BREAST LUMPECTOMY WITH RADIOACTIVE SEED AND SENTINEL LYMPH NODE BIOPSY Left 07/02/2023   Procedure: BREAST LUMPECTOMY WITH RADIOACTIVE SEED AND SENTINEL LYMPH NODE BIOPSY;  Surgeon: Aron Shoulders, MD;  Location: [facility];  Service: General;  Laterality:  Left;  90 MIN 78444- EXCISION LEFT CHEST WALL MASS GEN COMBINED WITH REGIONAL   COLONOSCOPY  2017   JMP-MAC-prep good-TA -recall 5 yr   COLONOSCOPY WITH PROPOFOL  N/A 06/01/2021   Procedure: COLONOSCOPY WITH PROPOFOL ;  Surgeon: Albertus Gordy HERO, MD;  Location: [facility];  Service: Gastroenterology;  Laterality: N/A;   ESOPHAGOGASTRODUODENOSCOPY (EGD) WITH PROPOFOL  N/A 06/01/2021   Procedure: ESOPHAGOGASTRODUODENOSCOPY (EGD) WITH PROPOFOL ;  Surgeon: Albertus Gordy HERO, MD;  Location: [facility];  Service: Gastroenterology;  Laterality: N/A;   LUMBAR LAMINECTOMY/DECOMPRESSION MICRODISCECTOMY Right 07/17/2013   Procedure: LUMBAR LAMINECTOMY/DECOMPRESSION MICRODISCECTOMY 1 LEVEL,RIGHT LUMBAR FOUR-FIVE;  Surgeon: Darina MALVA Boehringer, MD;  Location: [facility];  Service: Neurosurgery;  Laterality: Right;  Right   MASS EXCISION Left 07/02/2023   Procedure: EXCISION, MASS, CHEST WALL;  Surgeon: Aron Shoulders, MD;  Location: [facility];  Service: General;  Laterality: Left;   PARTIAL HYSTERECTOMY     POLYPECTOMY  2017   TA and benign polypoid   POLYPECTOMY  06/01/2021   Procedure: POLYPECTOMY;  Surgeon: Albertus Gordy HERO, MD;  Location: [facility];  Service: Gastroenterology;;   WISDOM TOOTH EXTRACTION     Patient Active Problem List   Diagnosis  Date Noted   Genetic testing 06/23/2023   Malignant neoplasm of upper-outer quadrant of left breast in female, estrogen receptor positive (HCC) 06/11/2023   Gastritis without bleeding    Esophageal dysphagia    Schatzki's ring    Benign neoplasm of ascending colon    Class 3 severe obesity with serious comorbidity and body mass index (BMI) of 40.0 to 44.9 in adult 08/25/2019   Type 2 diabetes mellitus without complication, without long-term current use of insulin (HCC) 08/25/2019   Chronic cough 06/27/2017   LUQ abdominal pain 12/07/2014   RUQ abdominal pain 11/10/2013   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   Gastroesophageal reflux disease  12/18/2012   Hx of adenomatous colonic polyps 12/18/2012   Chronic RLQ pain 12/18/2012   IBS (irritable bowel syndrome) 01/22/2011   HTN (hypertension) 01/22/2011   Hyperlipidemia 01/22/2011   Anxiety and depression 01/22/2011    PCP: Haze Servant, NP  REFERRING PROVIDER: Dr. Jina Nephew   REFERRING DIAG: Left breast cancer  THERAPY DIAG:  Lymphedema, not elsewhere classified  Localized edema  Stiffness of left shoulder, not elsewhere classified  Aftercare following surgery for neoplasm  Abnormal posture  Malignant neoplasm of upper-outer quadrant of left breast in female, estrogen receptor positive (HCC)  Rationale for Evaluation and Treatment: Rehabilitation  ONSET DATE: 04/26/23  SUBJECTIVE:                                                                                                                                                                                           SUBJECTIVE STATEMENT: I didn't get to do the self MLD yesterday but I normally do it most days. I have an appt with [facility] tomorrow to be fitted for more compression bras. I couldn't be officially fitted after surgery because my incision opened up so they gave me a size they thought was good. I'll find out tomorrow if I need a different size. I start the anastrozole  tomorrow.   PERTINENT HISTORY:  Patient was diagnosed on 04/26/2023 with left grade 3 invasive ductal carcinoma breast cancer. It measures 1 cm and is located in the upper outer quadrant. It is ER/PR positive and HER2 negative with a Ki67 of 30%. Pt underwent a L lumpectomy and SLNB 0/2 on 07/02/23  PATIENT GOALS:  Reassess how my recovery is going related to arm function, pain, and swelling.  PAIN:  Are you having pain? None -  PRECAUTIONS: Recent Surgery, left UE Lymphedema risk,   RED FLAGS: None   ACTIVITY LEVEL / LEISURE: doing post op exercises   OBJECTIVE:   PATIENT SURVEYS:  QUICK  DASH:  OBSERVATIONS: Healing scars with some increased scar tissue at L axilla  POSTURE:  Forward head and rounded shoulders posture   UPPER EXTREMITY AROM/PROM:   A/PROM RIGHT   eval    Shoulder extension 48  Shoulder flexion 153  Shoulder abduction 165  Shoulder internal rotation 63  Shoulder external rotation 84                          (Blank rows = not tested)   A/PROM LEFT   eval LEFT  07/25/23 LEFT 08/08/23 LEFT 10/01/23  Shoulder extension 35 70    Shoulder flexion 150 162 171 176  Shoulder abduction 167 156 151 163  Shoulder internal rotation 63 69    Shoulder external rotation 75 80                            (Blank rows = not tested)   CERVICAL AROM: All within normal limits   UPPER EXTREMITY STRENGTH: WFL   LYMPHEDEMA ASSESSMENTS (in cm):    LANDMARK RIGHT   eval  10 cm proximal to olecranon process 38.8  Olecranon process 28.7  10 cm proximal to ulnar styloid process 25.8  Just proximal to ulnar styloid process 17  Across hand at thumb web space 20.5  At base of 2nd digit 5.9  (Blank rows = not tested)   LANDMARK LEFT   eval LEFT 07/25/23  10 cm proximal to olecranon process 40 38.4  Olecranon process 28.8 29.5  10 cm proximal to ulnar styloid process 24.3 23  Just proximal to ulnar styloid process 16.3 16.5  Across hand at thumb web space 19.5 20  At base of 2nd digit 5.9 5.8  (Blank rows = not tested)  Surgery type/Date: 07/02/23 L breast lumpectomy and SLNB Number of lymph nodes removed: 0/2 Current/past treatment (chemo, radiation, hormone therapy): does not need chemo, will require radiation, will need hormone therapy Other symptoms:  Heaviness/tightness Yes Pain Yes Pitting edema No Infections No Decreased scar mobility Yes Stemmer sign No     TREATMENT PERFORMED: 10/10/23: Manual Therapy: MLD to Lt breast in supine: Short neck, superficial and deep abdominals, Rt axillary and pectoral nodes and establishment of anterior  inter-axillary pathway, Lt inguinal nodes and establishment of axillo-inguinal pathway, then Lt breast moving fluid towards pathways and L axilla spending extra time in any areas of fibrosis then into Rt S/L for further focus to latera and inferior breast redirecting towards lateral anastomosis, then back in supine for retracing all steps  STM when in Rt sidelying to Lt serratus and medial scap border using cocoa butter P/ROM to Lt shoulder into flex, abd and D2 with scapular depression by therapist throughout  10/08/23: Manual Therapy: MLD to Lt breast in supine: Short neck, superficial and deep abdominals, Rt axillary and pectoral nodes and establishment of anterior inter-axillary pathway, Lt inguinal nodes and establishment of axillo-inguinal pathway, then Lt breast moving fluid towards pathways and L axilla spending extra time in any areas of fibrosis then into Rt S/L for further focus to latera and inferior breast redirecting towards lateral anastomosis, then back in supine for retracing all steps  STM when in Rt sidelying to Lt serratus and medial scap border using cocoa butter P/ROM to Lt shoulder into flex, abd and D2 with scapular depression by therapist throughout  10/03/23 Manual Therapy: In supine: Short neck, 5 diaphragmatic breaths, R axillary nodes and establishment of  interaxillary pathway, L inguinal nodes and establishment of axilloinguinal pathway, then L breast moving fluid towards pathways and L axilla spending extra time in any areas of fibrosis then retracing all steps while educating pt in self MLD techniques and proper skin stretch technique. Had pt return demonstrate each step and provided v/c and t/c for correct skin stretch.  STM in R sidelying to L serratus using cocoa butter with decreased tightness noted by end of session      PATIENT EDUCATION:  Access Code: [email or web address] URL: [email or web address] Date: 08/06/2023 Prepared by: Grayce Sheldon  Exercises - Single Arm Doorway Pec Stretch at 90 Degrees Abduction  - 2 x daily - 7 x weekly - 1 sets - 3 reps - 20-30 hold - Supine Lower Trunk Rotation  - 1 x daily - 7 x weekly - 1 sets - 3 reps - 20-30 hold Education details: nerve desensitization, supine dowel exercises, lymphedema risk reduction and importance of skin care Person educated: Patient Education method: Explanation, Demonstration, Tactile cues, and Handouts Education comprehension: verbalized understanding and returned demonstration  HOME EXERCISE PROGRAM: Reviewed previously given post op HEP. Supine dowel exercises in to abduction and flexion  ASSESSMENT:  CLINICAL IMPRESSION: Pt reports much relief with tightness with STM of lateral trunk so continued with this during MLD. Some increased edema noted at superior breast today so focused here during MLD and encouraged same to pt. She verbalized understanding. She gets measured for new compression bras tomorrow at [facility].   Pt will benefit from skilled therapeutic intervention to improve on the following deficits: Decreased knowledge of precautions, impaired UE functional use, pain, decreased ROM, postural dysfunction, increased edema.   PT treatment/interventions: ADL/Self care home management, [phone]- PT Re-evaluation, 97110-Therapeutic exercises, 97530- Therapeutic activity, [id number]- Neuromuscular re-education, 97535- Self Care, 02859- Manual therapy, [id number]- Orthotic Initial, and [id number]- Orthotic/Prosthetic subsequent   GOALS: Goals reviewed with patient? Yes  LONG TERM GOALS:  (STG=LTG)  GOALS Name Target Date  Goal status  1 Pt will demonstrate she has regained full shoulder ROM and function post operatively compared to baselines.  Baseline: 08/22/23 MET  2 Pt will report no pain at end range of L shoulder abduction to allow improved function. 08/22/23 MET  3 Pt will be independent in a home exercise program for continued stretching and  strengthening.  08/22/23 MET  4 Pt will be independent in self MLD for long term management of L breast lymphedema and swelling in L axilla to decrease risk of infection. 10/29/23 NEW     PLAN:  PT FREQUENCY/DURATION: 2x/wk for 4 wks  PLAN FOR NEXT SESSION: How was [facility] appt? continue to review self MLD for L breast while performing and axilla and assess indep, STM to L serratus   [facility]  [address], [address]  [place] [address]  [phone]  Self manual lymph drainage: Do circles behind the collar bones x 10  Perform this sequence once a day.  Only give enough pressure no your skin to make the skin move.  Diaphragmatic - Supine   Inhale through nose making navel move out toward hands. Exhale through puckered lips, hands follow navel in. Repeat _5__ times. Rest _10__ seconds between repeats.   Copyright  VHI. All rights reserved.  Hug yourself.  Do circles at your neck just above your collarbones.  Repeat this 10 times.  Axilla - One at a Time   Using full weight of flat hand and fingers at  center of uninvolved (R) armpit, make _10__ in-place circles.   Copyright  VHI. All rights reserved.  LEG: Inguinal Nodes Stimulation   With small finger side of hand against hip crease on involved (L) side, gently perform circles at the crease. Repeat __10_ times.   Copyright  VHI. All rights reserved.  Axilla to Inguinal Nodes - Sweep   On involved side, stretch skin from armpit along side of trunk to hip crease.  Now gently stretch skin from the involved side to the uninvolved side across the chest at the shoulder line.    Move fluid from L armpit in area that feels swollen towards these 2 pathways by gently stretching skin.   Finish by doing circles in R armpit and L groin.   Finish by doing the pathways as described above going from your involved armpit to the same side groin and going across your upper chest from the  involved shoulder to the uninvolved shoulder.  Repeat the steps above where you do circles in your left groin and right armpit. Copyright  VHI. All rights reserved.    Aden Berwyn Caldron, PTA 10/10/2023, 1:02 PM

## 2023-10-15 ENCOUNTER — Encounter: Payer: Self-pay | Admitting: Physical Therapy

## 2023-10-15 ENCOUNTER — Ambulatory Visit: Payer: Self-pay | Attending: General Surgery | Admitting: Physical Therapy

## 2023-10-15 DIAGNOSIS — M25612 Stiffness of left shoulder, not elsewhere classified: Secondary | ICD-10-CM | POA: Diagnosis present

## 2023-10-15 DIAGNOSIS — R293 Abnormal posture: Secondary | ICD-10-CM | POA: Diagnosis present

## 2023-10-15 DIAGNOSIS — R600 Localized edema: Secondary | ICD-10-CM | POA: Insufficient documentation

## 2023-10-15 DIAGNOSIS — Z170 Estrogen receptor positive status [ER+]: Secondary | ICD-10-CM | POA: Insufficient documentation

## 2023-10-15 DIAGNOSIS — I890 Lymphedema, not elsewhere classified: Secondary | ICD-10-CM | POA: Diagnosis present

## 2023-10-15 DIAGNOSIS — Z483 Aftercare following surgery for neoplasm: Secondary | ICD-10-CM | POA: Insufficient documentation

## 2023-10-15 DIAGNOSIS — C50412 Malignant neoplasm of upper-outer quadrant of left female breast: Secondary | ICD-10-CM | POA: Insufficient documentation

## 2023-10-15 NOTE — Therapy (Signed)
 OUTPATIENT PHYSICAL THERAPY BREAST CANCER TREATMENT   Patient Name: Norma [name] MRN: [id number] DOB:1971/02/07, 53 y.o., female Today's Date: 10/15/2023  END OF SESSION:  PT End of Session - 10/15/23 1610     Visit Number 11    Number of Visits 15    Date for PT Re-Evaluation 10/29/23    PT Start Time 1556    PT Stop Time 1659    PT Time Calculation (min) 63 min    Activity Tolerance Patient tolerated treatment well    Behavior During Therapy WFL for tasks assessed/performed           Past Medical History:  Diagnosis Date   Allergy    Anemia    hx of   Anxiety    Arthritis    KNEES   Asthma    treated for during COVID tx   Cancer (HCC) 06/2023   left breast IDC   Chronic headache    Colon polyps    TUBULAR ADENOMAS AND HYPERPLASTIC    Complication of anesthesia    DDD (degenerative disc disease), thoracic    Depression    Diabetes mellitus, type 2 (HCC)    Difficult airway for intubation    Fibromyalgia    GERD (gastroesophageal reflux disease)    Goiter    Hyperlipidemia    Hypertension    Low back pain    Obesity    PONV (postoperative nausea and vomiting)    RA (rheumatoid arthritis) (HCC)    Seasonal allergies    Vitamin D  deficiency    Past Surgical History:  Procedure Laterality Date   ABDOMINAL HYSTERECTOMY  2008   BACK SURGERY  2005   lumbar laminectomy   BALLOON DILATION N/A 06/01/2021   Procedure: BALLOON DILATION;  Surgeon: Albertus Gordy HERO, MD;  Location: [facility];  Service: Gastroenterology;  Laterality: N/A;   BARTHOLIN GLAND CYST EXCISION     BIOPSY  06/01/2021   Procedure: BIOPSY;  Surgeon: Albertus Gordy HERO, MD;  Location: [facility];  Service: Gastroenterology;;   BREAST LUMPECTOMY WITH RADIOACTIVE SEED AND SENTINEL LYMPH NODE BIOPSY Left 07/02/2023   Procedure: BREAST LUMPECTOMY WITH RADIOACTIVE SEED AND SENTINEL LYMPH NODE BIOPSY;  Surgeon: Aron Shoulders, MD;  Location: [facility];  Service: General;  Laterality:  Left;  90 MIN 78444- EXCISION LEFT CHEST WALL MASS GEN COMBINED WITH REGIONAL   COLONOSCOPY  2017   JMP-MAC-prep good-TA -recall 5 yr   COLONOSCOPY WITH PROPOFOL  N/A 06/01/2021   Procedure: COLONOSCOPY WITH PROPOFOL ;  Surgeon: Albertus Gordy HERO, MD;  Location: [facility];  Service: Gastroenterology;  Laterality: N/A;   ESOPHAGOGASTRODUODENOSCOPY (EGD) WITH PROPOFOL  N/A 06/01/2021   Procedure: ESOPHAGOGASTRODUODENOSCOPY (EGD) WITH PROPOFOL ;  Surgeon: Albertus Gordy HERO, MD;  Location: [facility];  Service: Gastroenterology;  Laterality: N/A;   LUMBAR LAMINECTOMY/DECOMPRESSION MICRODISCECTOMY Right 07/17/2013   Procedure: LUMBAR LAMINECTOMY/DECOMPRESSION MICRODISCECTOMY 1 LEVEL,RIGHT LUMBAR FOUR-FIVE;  Surgeon: Darina MALVA Boehringer, MD;  Location: [facility];  Service: Neurosurgery;  Laterality: Right;  Right   MASS EXCISION Left 07/02/2023   Procedure: EXCISION, MASS, CHEST WALL;  Surgeon: Aron Shoulders, MD;  Location: [facility];  Service: General;  Laterality: Left;   PARTIAL HYSTERECTOMY     POLYPECTOMY  2017   TA and benign polypoid   POLYPECTOMY  06/01/2021   Procedure: POLYPECTOMY;  Surgeon: Albertus Gordy HERO, MD;  Location: [facility];  Service: Gastroenterology;;   WISDOM TOOTH EXTRACTION     Patient Active Problem List   Diagnosis  Date Noted   Genetic testing 06/23/2023   Malignant neoplasm of upper-outer quadrant of left breast in female, estrogen receptor positive (HCC) 06/11/2023   Gastritis without bleeding    Esophageal dysphagia    Schatzki's ring    Benign neoplasm of ascending colon    Class 3 severe obesity with serious comorbidity and body mass index (BMI) of 40.0 to 44.9 in adult 08/25/2019   Type 2 diabetes mellitus without complication, without long-term current use of insulin (HCC) 08/25/2019   Chronic cough 06/27/2017   LUQ abdominal pain 12/07/2014   RUQ abdominal pain 11/10/2013   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   Gastroesophageal reflux disease  12/18/2012   Hx of adenomatous colonic polyps 12/18/2012   Chronic RLQ pain 12/18/2012   IBS (irritable bowel syndrome) 01/22/2011   HTN (hypertension) 01/22/2011   Hyperlipidemia 01/22/2011   Anxiety and depression 01/22/2011    PCP: Haze Servant, NP  REFERRING PROVIDER: Dr. Jina Nephew   REFERRING DIAG: Left breast cancer  THERAPY DIAG:  Lymphedema, not elsewhere classified  Localized edema  Stiffness of left shoulder, not elsewhere classified  Aftercare following surgery for neoplasm  Abnormal posture  Malignant neoplasm of upper-outer quadrant of left breast in female, estrogen receptor positive (HCC)  Rationale for Evaluation and Treatment: Rehabilitation  ONSET DATE: 04/26/23  SUBJECTIVE:                                                                                                                                                                                           SUBJECTIVE STATEMENT: I cleaned out my garage on Saturday and then took stuff to [employer] on Monday. I think my arm was swelling some.   PERTINENT HISTORY:  Patient was diagnosed on 04/26/2023 with left grade 3 invasive ductal carcinoma breast cancer. It measures 1 cm and is located in the upper outer quadrant. It is ER/PR positive and HER2 negative with a Ki67 of 30%. Pt underwent a L lumpectomy and SLNB 0/2 on 07/02/23  PATIENT GOALS:  Reassess how my recovery is going related to arm function, pain, and swelling.  PAIN:  Are you having pain? None -  PRECAUTIONS: Recent Surgery, left UE Lymphedema risk,   RED FLAGS: None   ACTIVITY LEVEL / LEISURE: doing post op exercises   OBJECTIVE:   PATIENT SURVEYS:  QUICK DASH:     OBSERVATIONS: Healing scars with some increased scar tissue at L axilla  POSTURE:  Forward head and rounded shoulders posture   UPPER EXTREMITY AROM/PROM:   A/PROM RIGHT   eval    Shoulder extension 48  Shoulder flexion 153  Shoulder abduction 165  Shoulder internal rotation 63  Shoulder external rotation 84                          (Blank rows = not tested)   A/PROM LEFT   eval LEFT  07/25/23 LEFT 08/08/23 LEFT 10/01/23  Shoulder extension 35 70    Shoulder flexion 150 162 171 176  Shoulder abduction 167 156 151 163  Shoulder internal rotation 63 69    Shoulder external rotation 75 80                            (Blank rows = not tested)   CERVICAL AROM: All within normal limits   UPPER EXTREMITY STRENGTH: WFL   LYMPHEDEMA ASSESSMENTS (in cm):    LANDMARK RIGHT   eval  10 cm proximal to olecranon process 38.8  Olecranon process 28.7  10 cm proximal to ulnar styloid process 25.8  Just proximal to ulnar styloid process 17  Across hand at thumb web space 20.5  At base of 2nd digit 5.9  (Blank rows = not tested)   LANDMARK LEFT   eval LEFT 07/25/23  10 cm proximal to olecranon process 40 38.4  Olecranon process 28.8 29.5  10 cm proximal to ulnar styloid process 24.3 23  Just proximal to ulnar styloid process 16.3 16.5  Across hand at thumb web space 19.5 20  At base of 2nd digit 5.9 5.8  (Blank rows = not tested)  Surgery type/Date: 07/02/23 L breast lumpectomy and SLNB Number of lymph nodes removed: 0/2 Current/past treatment (chemo, radiation, hormone therapy): does not need chemo, will require radiation, will need hormone therapy Other symptoms:  Heaviness/tightness Yes Pain Yes Pitting edema No Infections No Decreased scar mobility Yes Stemmer sign No     TREATMENT PERFORMED: 10/15/23: Manual Therapy: MLD to Lt breast and upper arm in supine: Short neck, superficial and deep abdominals, Rt axillary and pectoral nodes and establishment of anterior inter-axillary pathway, Lt inguinal nodes and establishment of axillo-inguinal pathway, then Lt breast moving fluid towards pathways and L axilla spending extra time in any areas of fibrosis then LUE working proximal to distal and retracing all steps  STM when  in Rt sidelying to Lt serratus Educated pt to wear her compression sleeve  L-DEX FLOWSHEETS - 10/15/23 1600       L-DEX LYMPHEDEMA SCREENING   Measurement Type Unilateral    L-DEX MEASUREMENT EXTREMITY Upper Extremity    POSITION  Standing    DOMINANT SIDE Right    At Risk Side Left    BASELINE SCORE (UNILATERAL) -0.7    L-DEX SCORE (UNILATERAL) 5.6    VALUE CHANGE (UNILAT) 6.3          The patient was assessed using the L-Dex machine today to produce a lymphedema index baseline score. The patient will be reassessed on a regular basis (typically every 3 months) to obtain new L-Dex scores. If the score is > 6.5 points away from his/her baseline score indicating onset of subclinical lymphedema, it will be recommended to wear a compression garment for 4 weeks, 12 hours per day and then be reassessed. If the score continues to be > 6.5 points from baseline at reassessment, we will initiate lymphedema treatment. Assessing in this manner has a 95% rate of preventing clinically significant lymphedema.  10/10/23: Manual Therapy: MLD to Lt breast in supine: Short neck, superficial and deep abdominals, Rt axillary and  pectoral nodes and establishment of anterior inter-axillary pathway, Lt inguinal nodes and establishment of axillo-inguinal pathway, then Lt breast moving fluid towards pathways and L axilla spending extra time in any areas of fibrosis then into Rt S/L for further focus to latera and inferior breast redirecting towards lateral anastomosis, then back in supine for retracing all steps  STM when in Rt sidelying to Lt serratus and medial scap border using cocoa butter P/ROM to Lt shoulder into flex, abd and D2 with scapular depression by therapist throughout  10/08/23: Manual Therapy: MLD to Lt breast in supine: Short neck, superficial and deep abdominals, Rt axillary and pectoral nodes and establishment of anterior inter-axillary pathway, Lt inguinal nodes and establishment of  axillo-inguinal pathway, then Lt breast moving fluid towards pathways and L axilla spending extra time in any areas of fibrosis then into Rt S/L for further focus to latera and inferior breast redirecting towards lateral anastomosis, then back in supine for retracing all steps  STM when in Rt sidelying to Lt serratus and medial scap border using cocoa butter P/ROM to Lt shoulder into flex, abd and D2 with scapular depression by therapist throughout  10/03/23 Manual Therapy: In supine: Short neck, 5 diaphragmatic breaths, R axillary nodes and establishment of interaxillary pathway, L inguinal nodes and establishment of axilloinguinal pathway, then L breast moving fluid towards pathways and L axilla spending extra time in any areas of fibrosis then retracing all steps while educating pt in self MLD techniques and proper skin stretch technique. Had pt return demonstrate each step and provided v/c and t/c for correct skin stretch.  STM in R sidelying to L serratus using cocoa butter with decreased tightness noted by end of session      PATIENT EDUCATION:  Wear compression bra 12 hrs/day for 4 weeks during day time hours Access Code: [email or web address] URL: [email or web address] Date: 08/06/2023 Prepared by: Grayce Sheldon  Exercises - Single Arm Doorway Pec Stretch at 90 Degrees Abduction  - 2 x daily - 7 x weekly - 1 sets - 3 reps - 20-30 hold - Supine Lower Trunk Rotation  - 1 x daily - 7 x weekly - 1 sets - 3 reps - 20-30 hold Education details: nerve desensitization, supine dowel exercises, lymphedema risk reduction and importance of skin care Person educated: Patient Education method: Explanation, Demonstration, Tactile cues, and Handouts Education comprehension: verbalized understanding and returned demonstration  HOME EXERCISE PROGRAM: Reviewed previously given post op HEP. Supine dowel exercises in to abduction and flexion  ASSESSMENT:  CLINICAL IMPRESSION: Pt did a lot of  work in her garage over the weekend and then moved things in to a [employer]. Her sozo was very elevated today (6.3). She has a compression sleeve at home so educated pt to begin wearing that during day time hours. Added MLD to upper arm today to help decrease swelling. Continued with MLD to breast and STM to serratus to decrease tightness.   Pt will benefit from skilled therapeutic intervention to improve on the following deficits: Decreased knowledge of precautions, impaired UE functional use, pain, decreased ROM, postural dysfunction, increased edema.   PT treatment/interventions: ADL/Self care home management, [phone]- PT Re-evaluation, 97110-Therapeutic exercises, 97530- Therapeutic activity, [id number]- Neuromuscular re-education, 97535- Self Care, [id number]- Manual therapy, [id number]- Orthotic Initial, and [id number]- Orthotic/Prosthetic subsequent   GOALS: Goals reviewed with patient? Yes  LONG TERM GOALS:  (STG=LTG)  GOALS Name Target Date  Goal status  1 Pt will demonstrate she has regained full shoulder ROM and function post operatively  compared to baselines.  Baseline: 08/22/23 MET  2 Pt will report no pain at end range of L shoulder abduction to allow improved function. 08/22/23 MET  3 Pt will be independent in a home exercise program for continued stretching and strengthening.  08/22/23 MET  4 Pt will be independent in self MLD for long term management of L breast lymphedema and swelling in L axilla to decrease risk of infection. 10/29/23 NEW     PLAN:  PT FREQUENCY/DURATION: 2x/wk for 4 wks  PLAN FOR NEXT SESSION: extend POC and reassess SOZO in 4 weeks, How was [employer] appt? continue to review self MLD for L breast while performing and axilla and assess indep, STM to L serratus   [facility]  [address], [address]  [place] [address]  [phone]  Self manual lymph drainage: Do circles behind the collar bones x 10  Perform this sequence once a day.  Only  give enough pressure no your skin to make the skin move.  Diaphragmatic - Supine   Inhale through nose making navel move out toward hands. Exhale through puckered lips, hands follow navel in. Repeat _5__ times. Rest _10__ seconds between repeats.   Copyright  VHI. All rights reserved.  Hug yourself.  Do circles at your neck just above your collarbones.  Repeat this 10 times.  Axilla - One at a Time   Using full weight of flat hand and fingers at center of uninvolved (R) armpit, make _10__ in-place circles.   Copyright  VHI. All rights reserved.  LEG: Inguinal Nodes Stimulation   With small finger side of hand against hip crease on involved (L) side, gently perform circles at the crease. Repeat __10_ times.   Copyright  VHI. All rights reserved.  Axilla to Inguinal Nodes - Sweep   On involved side, stretch skin from armpit along side of trunk to hip crease.  Now gently stretch skin from the involved side to the uninvolved side across the chest at the shoulder line.    Move fluid from L armpit in area that feels swollen towards these 2 pathways by gently stretching skin.   Finish by doing circles in R armpit and L groin.   Finish by doing the pathways as described above going from your involved armpit to the same side groin and going across your upper chest from the involved shoulder to the uninvolved shoulder.  Repeat the steps above where you do circles in your left groin and right armpit. Copyright  VHI. All rights reserved.    [facility] [place], PT 10/15/2023, 5:03 PM

## 2023-10-17 ENCOUNTER — Encounter: Payer: Self-pay | Admitting: Physical Therapy

## 2023-10-17 ENCOUNTER — Ambulatory Visit: Payer: Self-pay | Admitting: Physical Therapy

## 2023-10-17 DIAGNOSIS — Z170 Estrogen receptor positive status [ER+]: Secondary | ICD-10-CM

## 2023-10-17 DIAGNOSIS — R293 Abnormal posture: Secondary | ICD-10-CM

## 2023-10-17 DIAGNOSIS — I890 Lymphedema, not elsewhere classified: Secondary | ICD-10-CM

## 2023-10-17 DIAGNOSIS — Z483 Aftercare following surgery for neoplasm: Secondary | ICD-10-CM

## 2023-10-17 DIAGNOSIS — R600 Localized edema: Secondary | ICD-10-CM

## 2023-10-17 DIAGNOSIS — M25612 Stiffness of left shoulder, not elsewhere classified: Secondary | ICD-10-CM

## 2023-10-17 NOTE — Therapy (Signed)
 OUTPATIENT PHYSICAL THERAPY BREAST CANCER TREATMENT   Patient Name: Norma [name] MRN: [id number] DOB:Jul 19, 1970, 53 y.o., female Today's Date: 10/17/2023  END OF SESSION:  PT End of Session - 10/17/23 1604     Visit Number 12    Number of Visits 15    Date for PT Re-Evaluation 10/29/23    PT Start Time 1604    PT Stop Time 1655    PT Time Calculation (min) 51 min    Activity Tolerance Patient tolerated treatment well    Behavior During Therapy WFL for tasks assessed/performed           Past Medical History:  Diagnosis Date   Allergy    Anemia    hx of   Anxiety    Arthritis    KNEES   Asthma    treated for during COVID tx   Cancer (HCC) 06/2023   left breast IDC   Chronic headache    Colon polyps    TUBULAR ADENOMAS AND HYPERPLASTIC    Complication of anesthesia    DDD (degenerative disc disease), thoracic    Depression    Diabetes mellitus, type 2 (HCC)    Difficult airway for intubation    Fibromyalgia    GERD (gastroesophageal reflux disease)    Goiter    Hyperlipidemia    Hypertension    Low back pain    Obesity    PONV (postoperative nausea and vomiting)    RA (rheumatoid arthritis) (HCC)    Seasonal allergies    Vitamin D  deficiency    Past Surgical History:  Procedure Laterality Date   ABDOMINAL HYSTERECTOMY  2008   BACK SURGERY  2005   lumbar laminectomy   BALLOON DILATION N/A 06/01/2021   Procedure: BALLOON DILATION;  Surgeon: Albertus Gordy HERO, MD;  Location: [facility];  Service: Gastroenterology;  Laterality: N/A;   BARTHOLIN GLAND CYST EXCISION     BIOPSY  06/01/2021   Procedure: BIOPSY;  Surgeon: Albertus Gordy HERO, MD;  Location: [facility];  Service: Gastroenterology;;   BREAST LUMPECTOMY WITH RADIOACTIVE SEED AND SENTINEL LYMPH NODE BIOPSY Left 07/02/2023   Procedure: BREAST LUMPECTOMY WITH RADIOACTIVE SEED AND SENTINEL LYMPH NODE BIOPSY;  Surgeon: Aron Shoulders, MD;  Location: [facility];  Service: General;  Laterality:  Left;  90 MIN 78444- EXCISION LEFT CHEST WALL MASS GEN COMBINED WITH REGIONAL   COLONOSCOPY  2017   JMP-MAC-prep good-TA -recall 5 yr   COLONOSCOPY WITH PROPOFOL  N/A 06/01/2021   Procedure: COLONOSCOPY WITH PROPOFOL ;  Surgeon: Albertus Gordy HERO, MD;  Location: [facility];  Service: Gastroenterology;  Laterality: N/A;   ESOPHAGOGASTRODUODENOSCOPY (EGD) WITH PROPOFOL  N/A 06/01/2021   Procedure: ESOPHAGOGASTRODUODENOSCOPY (EGD) WITH PROPOFOL ;  Surgeon: Albertus Gordy HERO, MD;  Location: [facility];  Service: Gastroenterology;  Laterality: N/A;   LUMBAR LAMINECTOMY/DECOMPRESSION MICRODISCECTOMY Right 07/17/2013   Procedure: LUMBAR LAMINECTOMY/DECOMPRESSION MICRODISCECTOMY 1 LEVEL,RIGHT LUMBAR FOUR-FIVE;  Surgeon: Darina MALVA Boehringer, MD;  Location: [facility];  Service: Neurosurgery;  Laterality: Right;  Right   MASS EXCISION Left 07/02/2023   Procedure: EXCISION, MASS, CHEST WALL;  Surgeon: Aron Shoulders, MD;  Location: [facility];  Service: General;  Laterality: Left;   PARTIAL HYSTERECTOMY     POLYPECTOMY  2017   TA and benign polypoid   POLYPECTOMY  06/01/2021   Procedure: POLYPECTOMY;  Surgeon: Albertus Gordy HERO, MD;  Location: [facility];  Service: Gastroenterology;;   WISDOM TOOTH EXTRACTION     Patient Active Problem List   Diagnosis  Date Noted   Genetic testing 06/23/2023   Malignant neoplasm of upper-outer quadrant of left breast in female, estrogen receptor positive (HCC) 06/11/2023   Gastritis without bleeding    Esophageal dysphagia    Schatzki's ring    Benign neoplasm of ascending colon    Class 3 severe obesity with serious comorbidity and body mass index (BMI) of 40.0 to 44.9 in adult 08/25/2019   Type 2 diabetes mellitus without complication, without long-term current use of insulin (HCC) 08/25/2019   Chronic cough 06/27/2017   LUQ abdominal pain 12/07/2014   RUQ abdominal pain 11/10/2013   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   Gastroesophageal reflux disease  12/18/2012   Hx of adenomatous colonic polyps 12/18/2012   Chronic RLQ pain 12/18/2012   IBS (irritable bowel syndrome) 01/22/2011   HTN (hypertension) 01/22/2011   Hyperlipidemia 01/22/2011   Anxiety and depression 01/22/2011    PCP: Haze Servant, NP  REFERRING PROVIDER: Dr. Jina Nephew   REFERRING DIAG: Left breast cancer  THERAPY DIAG:  Lymphedema, not elsewhere classified  Localized edema  Stiffness of left shoulder, not elsewhere classified  Aftercare following surgery for neoplasm  Abnormal posture  Malignant neoplasm of upper-outer quadrant of left breast in female, estrogen receptor positive (HCC)  Rationale for Evaluation and Treatment: Rehabilitation  ONSET DATE: 04/26/23  SUBJECTIVE:                                                                                                                                                                                           SUBJECTIVE STATEMENT: I have been wearing my sleeve. My arm feels achy at the back upper arm.   PERTINENT HISTORY:  Patient was diagnosed on 04/26/2023 with left grade 3 invasive ductal carcinoma breast cancer. It measures 1 cm and is located in the upper outer quadrant. It is ER/PR positive and HER2 negative with a Ki67 of 30%. Pt underwent a L lumpectomy and SLNB 0/2 on 07/02/23  PATIENT GOALS:  Reassess how my recovery is going related to arm function, pain, and swelling.  PAIN:  Are you having pain? No pain just achy in posterior L upper arm  PRECAUTIONS: Recent Surgery, left UE Lymphedema risk,   RED FLAGS: None   ACTIVITY LEVEL / LEISURE: doing post op exercises   OBJECTIVE:   PATIENT SURVEYS:  QUICK DASH:     OBSERVATIONS: Healing scars with some increased scar tissue at L axilla  POSTURE:  Forward head and rounded shoulders posture   UPPER EXTREMITY AROM/PROM:   A/PROM RIGHT   eval    Shoulder extension 48  Shoulder flexion 153  Shoulder abduction 165  Shoulder  internal rotation 63  Shoulder external rotation 84                          (Blank rows = not tested)   A/PROM LEFT   eval LEFT  07/25/23 LEFT 08/08/23 LEFT 10/01/23  Shoulder extension 35 70    Shoulder flexion 150 162 171 176  Shoulder abduction 167 156 151 163  Shoulder internal rotation 63 69    Shoulder external rotation 75 80                            (Blank rows = not tested)   CERVICAL AROM: All within normal limits   UPPER EXTREMITY STRENGTH: WFL   LYMPHEDEMA ASSESSMENTS (in cm):    LANDMARK RIGHT   eval  10 cm proximal to olecranon process 38.8  Olecranon process 28.7  10 cm proximal to ulnar styloid process 25.8  Just proximal to ulnar styloid process 17  Across hand at thumb web space 20.5  At base of 2nd digit 5.9  (Blank rows = not tested)   LANDMARK LEFT   eval LEFT 07/25/23  10 cm proximal to olecranon process 40 38.4  Olecranon process 28.8 29.5  10 cm proximal to ulnar styloid process 24.3 23  Just proximal to ulnar styloid process 16.3 16.5  Across hand at thumb web space 19.5 20  At base of 2nd digit 5.9 5.8  (Blank rows = not tested)  Surgery type/Date: 07/02/23 L breast lumpectomy and SLNB Number of lymph nodes removed: 0/2 Current/past treatment (chemo, radiation, hormone therapy): does not need chemo, will require radiation, will need hormone therapy Other symptoms:  Heaviness/tightness Yes Pain Yes Pitting edema No Infections No Decreased scar mobility Yes Stemmer sign No     TREATMENT PERFORMED: 10/17/23: Manual Therapy: MLD to Lt breast and upper arm in supine: Short neck, superficial and deep abdominals, Rt axillary and pectoral nodes and establishment of anterior inter-axillary pathway, Lt inguinal nodes and establishment of axillo-inguinal pathway, then Lt breast moving fluid towards pathways and L axilla spending extra time in any areas of fibrosis then LUE working proximal to distal and retracing all steps  STM when in Rt  sidelying to Lt serratus and lateral trunk Issued instructions for UE MLD 10/15/23: Manual Therapy: MLD to Lt breast and upper arm in supine: Short neck, superficial and deep abdominals, Rt axillary and pectoral nodes and establishment of anterior inter-axillary pathway, Lt inguinal nodes and establishment of axillo-inguinal pathway, then Lt breast moving fluid towards pathways and L axilla spending extra time in any areas of fibrosis then LUE working proximal to distal and retracing all steps  STM when in Rt sidelying to Lt serratus Educated pt to wear her compression sleeve    The patient was assessed using the L-Dex machine today to produce a lymphedema index baseline score. The patient will be reassessed on a regular basis (typically every 3 months) to obtain new L-Dex scores. If the score is > 6.5 points away from his/her baseline score indicating onset of subclinical lymphedema, it will be recommended to wear a compression garment for 4 weeks, 12 hours per day and then be reassessed. If the score continues to be > 6.5 points from baseline at reassessment, we will initiate lymphedema treatment. Assessing in this manner has a 95% rate of preventing clinically significant lymphedema.  10/10/23: Manual Therapy: MLD to Lt breast in supine:  Short neck, superficial and deep abdominals, Rt axillary and pectoral nodes and establishment of anterior inter-axillary pathway, Lt inguinal nodes and establishment of axillo-inguinal pathway, then Lt breast moving fluid towards pathways and L axilla spending extra time in any areas of fibrosis then into Rt S/L for further focus to latera and inferior breast redirecting towards lateral anastomosis, then back in supine for retracing all steps  STM when in Rt sidelying to Lt serratus and medial scap border using cocoa butter P/ROM to Lt shoulder into flex, abd and D2 with scapular depression by therapist throughout  10/08/23: Manual Therapy: MLD to Lt breast in  supine: Short neck, superficial and deep abdominals, Rt axillary and pectoral nodes and establishment of anterior inter-axillary pathway, Lt inguinal nodes and establishment of axillo-inguinal pathway, then Lt breast moving fluid towards pathways and L axilla spending extra time in any areas of fibrosis then into Rt S/L for further focus to latera and inferior breast redirecting towards lateral anastomosis, then back in supine for retracing all steps  STM when in Rt sidelying to Lt serratus and medial scap border using cocoa butter P/ROM to Lt shoulder into flex, abd and D2 with scapular depression by therapist throughout  10/03/23 Manual Therapy: In supine: Short neck, 5 diaphragmatic breaths, R axillary nodes and establishment of interaxillary pathway, L inguinal nodes and establishment of axilloinguinal pathway, then L breast moving fluid towards pathways and L axilla spending extra time in any areas of fibrosis then retracing all steps while educating pt in self MLD techniques and proper skin stretch technique. Had pt return demonstrate each step and provided v/c and t/c for correct skin stretch.  STM in R sidelying to L serratus using cocoa butter with decreased tightness noted by end of session      PATIENT EDUCATION:  Wear compression bra 12 hrs/day for 4 weeks during day time hours Access Code: [email or web address] URL: [email or web address] Date: 08/06/2023 Prepared by: Grayce Sheldon  Exercises - Single Arm Doorway Pec Stretch at 90 Degrees Abduction  - 2 x daily - 7 x weekly - 1 sets - 3 reps - 20-30 hold - Supine Lower Trunk Rotation  - 1 x daily - 7 x weekly - 1 sets - 3 reps - 20-30 hold Education details: nerve desensitization, supine dowel exercises, lymphedema risk reduction and importance of skin care Person educated: Patient Education method: Explanation, Demonstration, Tactile cues, and Handouts Education comprehension: verbalized understanding and returned  demonstration  HOME EXERCISE PROGRAM: Reviewed previously given post op HEP. Supine dowel exercises in to abduction and flexion  ASSESSMENT:  CLINICAL IMPRESSION: Pt has been wearing her compression sleeve but feels achy in her upper arm. Continued with MLD to L breast and focused on L upper arm today. Issued handout for pt to add self MLD to left upper arm. Continued with soft tissue mobilization to lateral trunk to improve comfort and decrease tightness.   Pt will benefit from skilled therapeutic intervention to improve on the following deficits: Decreased knowledge of precautions, impaired UE functional use, pain, decreased ROM, postural dysfunction, increased edema.   PT treatment/interventions: ADL/Self care home management, [phone]- PT Re-evaluation, 97110-Therapeutic exercises, 97530- Therapeutic activity, [id number]- Neuromuscular re-education, 97535- Self Care, 02859- Manual therapy, [id number]- Orthotic Initial, and [id number]- Orthotic/Prosthetic subsequent   GOALS: Goals reviewed with patient? Yes  LONG TERM GOALS:  (STG=LTG)  GOALS Name Target Date  Goal status  1 Pt will demonstrate she has regained full shoulder ROM and function post operatively compared to baselines.  Baseline:  08/22/23 MET  2 Pt will report no pain at end range of L shoulder abduction to allow improved function. 08/22/23 MET  3 Pt will be independent in a home exercise program for continued stretching and strengthening.  08/22/23 MET  4 Pt will be independent in self MLD for long term management of L breast lymphedema and swelling in L axilla to decrease risk of infection. 10/29/23 NEW     PLAN:  PT FREQUENCY/DURATION: 2x/wk for 4 wks  PLAN FOR NEXT SESSION: extend POC and reassess SOZO in 4 weeks, assess indep with self MLD, How was [employer] appt? continue to review self MLD for L breast while performing and axilla and assess indep, STM to L serratus   [facility]  [address], [address]  [place] [address]  [phone]  Self manual lymph drainage: Do circles behind the collar bones x 10  Perform this sequence once a day.  Only give enough pressure no your skin to make the skin move.  Diaphragmatic - Supine   Inhale through nose making navel move out toward hands. Exhale through puckered lips, hands follow navel in. Repeat _5__ times. Rest _10__ seconds between repeats.   Copyright  VHI. All rights reserved.  Hug yourself.  Do circles at your neck just above your collarbones.  Repeat this 10 times.  Axilla - One at a Time   Using full weight of flat hand and fingers at center of uninvolved (R) armpit, make _10__ in-place circles.   Copyright  VHI. All rights reserved.  LEG: Inguinal Nodes Stimulation   With small finger side of hand against hip crease on involved (L) side, gently perform circles at the crease. Repeat __10_ times.   Copyright  VHI. All rights reserved.  Axilla to Inguinal Nodes - Sweep   On involved side, stretch skin from armpit along side of trunk to hip crease.  Now gently stretch skin from the involved side to the uninvolved side across the chest at the shoulder line.    Move fluid from L armpit in area that feels swollen towards these 2 pathways by gently stretching skin.   Finish by doing circles in R armpit and L groin.   Finish by doing the pathways as described above going from your involved armpit to the same side groin and going across your upper chest from the involved shoulder to the uninvolved shoulder.  Repeat the steps above where you do circles in your left groin and right armpit. Copyright  VHI. All rights reserved.    [facility], PT 10/17/2023, 5:09 PM

## 2023-10-17 NOTE — Telephone Encounter (Signed)
 Appointment confirmed

## 2023-10-17 NOTE — Patient Instructions (Signed)
Deep Effective Breath   Standing, sitting, or laying down, place both hands on the belly. Take a deep breath IN, expanding the belly; then breath OUT, contracting the belly. Repeat __5__ times. Do __2-3__ sessions per day and before your self massage.  [email or web address].[email or web address]   Copyright  VHI. All rights reserved.  Axilla to Axilla - Sweep   On uninvolved side make 5 circles in the armpit, then pump _5__ times from involved armpit across chest to uninvolved armpit, making a pathway. Do _1__ time per day.  Copyright  VHI. All rights reserved.  Axilla to Inguinal Nodes - Sweep   On involved side, make 5 circles at groin at panty line, then pump _5__ times from armpit along side of trunk to outer hip, making your other pathway. Do __1_ time per day.  Copyright  VHI. All rights reserved.  Arm Posterior: Elbow to Shoulder - Sweep   Pump _5__ times from back of elbow to top of shoulder. Then inner to outer upper arm _5_ times, then outer arm again _5_ times. Then back to the pathways _2-3_ times. Do _1__ time per day.  Copyright  VHI. All rights reserved.  ARM: Volar Wrist to Elbow - Sweep   Pump or stationary circles _5__ times from wrist to elbow making sure to do both sides of the forearm. Then retrace your steps to the outer arm, and the pathways _2-3_ times each. Do _1__ time per day.  Copyright  VHI. All rights reserved.  ARM: Dorsum of Hand to Shoulder - Sweep   Pump or stationary circles _5__ times on back of hand including knuckle spaces and individual fingers if needed working up towards the wrist, then retrace all your steps working back up the forearm, doing both sides; upper outer arm and back to your pathways _2-3_ times each. Then do 5 circles again at uninvolved armpit and involved groin where you started! Good job!! Do __1_ time per day.  Copyright  VHI. All rights reserved.      

## 2023-10-21 ENCOUNTER — Encounter: Admitting: Family Medicine

## 2023-10-21 ENCOUNTER — Ambulatory Visit: Payer: Self-pay

## 2023-10-21 DIAGNOSIS — Z170 Estrogen receptor positive status [ER+]: Secondary | ICD-10-CM

## 2023-10-21 DIAGNOSIS — Z483 Aftercare following surgery for neoplasm: Secondary | ICD-10-CM

## 2023-10-21 DIAGNOSIS — M25612 Stiffness of left shoulder, not elsewhere classified: Secondary | ICD-10-CM

## 2023-10-21 DIAGNOSIS — R293 Abnormal posture: Secondary | ICD-10-CM

## 2023-10-21 DIAGNOSIS — I890 Lymphedema, not elsewhere classified: Secondary | ICD-10-CM

## 2023-10-21 DIAGNOSIS — R600 Localized edema: Secondary | ICD-10-CM

## 2023-10-21 NOTE — Progress Notes (Unsigned)
  Start: *** end: *** Patient is here today *** Patient would like to learn *** Patient lives with ***.  *** shopping and cooking.  History includes:  *** Medications include:  *** Labs noted:  ***   7.5%, glimepiride

## 2023-10-21 NOTE — Therapy (Signed)
 OUTPATIENT PHYSICAL THERAPY BREAST CANCER TREATMENT   Patient Name: Norma [name] MRN: [id number] DOB:07-01-1970, 53 y.o., female Today's Date: 10/21/2023  END OF SESSION:  PT End of Session - 10/21/23 1407     Visit Number 13    Number of Visits 15    Date for PT Re-Evaluation 10/29/23    PT Start Time 1404    PT Stop Time 1501    PT Time Calculation (min) 57 min    Activity Tolerance Patient tolerated treatment well    Behavior During Therapy [facility] for tasks assessed/performed           Past Medical History:  Diagnosis Date   Allergy    Anemia    hx of   Anxiety    Arthritis    KNEES   Asthma    treated for during COVID tx   Cancer (HCC) 06/2023   left breast IDC   Chronic headache    Colon polyps    TUBULAR ADENOMAS AND HYPERPLASTIC    Complication of anesthesia    DDD (degenerative disc disease), thoracic    Depression    Diabetes mellitus, type 2 (HCC)    Difficult airway for intubation    Fibromyalgia    GERD (gastroesophageal reflux disease)    Goiter    Hyperlipidemia    Hypertension    Low back pain    Obesity    PONV (postoperative nausea and vomiting)    RA (rheumatoid arthritis) (HCC)    Seasonal allergies    Vitamin D  deficiency    Past Surgical History:  Procedure Laterality Date   ABDOMINAL HYSTERECTOMY  2008   BACK SURGERY  2005   lumbar laminectomy   BALLOON DILATION N/A 06/01/2021   Procedure: BALLOON DILATION;  Surgeon: Albertus Gordy HERO, MD;  Location: [facility];  Service: Gastroenterology;  Laterality: N/A;   BARTHOLIN GLAND CYST EXCISION     BIOPSY  06/01/2021   Procedure: BIOPSY;  Surgeon: Albertus Gordy HERO, MD;  Location: [facility];  Service: Gastroenterology;;   BREAST LUMPECTOMY WITH RADIOACTIVE SEED AND SENTINEL LYMPH NODE BIOPSY Left 07/02/2023   Procedure: BREAST LUMPECTOMY WITH RADIOACTIVE SEED AND SENTINEL LYMPH NODE BIOPSY;  Surgeon: Aron Shoulders, MD;  Location: [facility];  Service: General;  Laterality:  Left;  90 MIN 78444- EXCISION LEFT CHEST WALL MASS GEN COMBINED WITH REGIONAL   COLONOSCOPY  2017   JMP-MAC-prep good-TA -recall 5 yr   COLONOSCOPY WITH PROPOFOL  N/A 06/01/2021   Procedure: COLONOSCOPY WITH PROPOFOL ;  Surgeon: Albertus Gordy HERO, MD;  Location: [facility];  Service: Gastroenterology;  Laterality: N/A;   ESOPHAGOGASTRODUODENOSCOPY (EGD) WITH PROPOFOL  N/A 06/01/2021   Procedure: ESOPHAGOGASTRODUODENOSCOPY (EGD) WITH PROPOFOL ;  Surgeon: Albertus Gordy HERO, MD;  Location: [facility];  Service: Gastroenterology;  Laterality: N/A;   LUMBAR LAMINECTOMY/DECOMPRESSION MICRODISCECTOMY Right 07/17/2013   Procedure: LUMBAR LAMINECTOMY/DECOMPRESSION MICRODISCECTOMY 1 LEVEL,RIGHT LUMBAR FOUR-FIVE;  Surgeon: Darina MALVA Boehringer, MD;  Location: [facility];  Service: Neurosurgery;  Laterality: Right;  Right   MASS EXCISION Left 07/02/2023   Procedure: EXCISION, MASS, CHEST WALL;  Surgeon: Aron Shoulders, MD;  Location: [facility];  Service: General;  Laterality: Left;   PARTIAL HYSTERECTOMY     POLYPECTOMY  2017   TA and benign polypoid   POLYPECTOMY  06/01/2021   Procedure: POLYPECTOMY;  Surgeon: Albertus Gordy HERO, MD;  Location: [facility];  Service: Gastroenterology;;   WISDOM TOOTH EXTRACTION     Patient Active Problem List   Diagnosis  Date Noted   Genetic testing 06/23/2023   Malignant neoplasm of upper-outer quadrant of left breast in female, estrogen receptor positive (HCC) 06/11/2023   Gastritis without bleeding    Esophageal dysphagia    Schatzki's ring    Benign neoplasm of ascending colon    Class 3 severe obesity with serious comorbidity and body mass index (BMI) of 40.0 to 44.9 in adult 08/25/2019   Type 2 diabetes mellitus without complication, without long-term current use of insulin (HCC) 08/25/2019   Chronic cough 06/27/2017   LUQ abdominal pain 12/07/2014   RUQ abdominal pain 11/10/2013   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   Gastroesophageal reflux disease  12/18/2012   Hx of adenomatous colonic polyps 12/18/2012   Chronic RLQ pain 12/18/2012   IBS (irritable bowel syndrome) 01/22/2011   HTN (hypertension) 01/22/2011   Hyperlipidemia 01/22/2011   Anxiety and depression 01/22/2011    PCP: Haze Servant, NP  REFERRING PROVIDER: Dr. Jina Nephew   REFERRING DIAG: Left breast cancer  THERAPY DIAG:  Lymphedema, not elsewhere classified  Localized edema  Stiffness of left shoulder, not elsewhere classified  Aftercare following surgery for neoplasm  Abnormal posture  Malignant neoplasm of upper-outer quadrant of left breast in female, estrogen receptor positive (HCC)  Rationale for Evaluation and Treatment: Rehabilitation  ONSET DATE: 04/26/23  SUBJECTIVE:                                                                                                                                                                                           SUBJECTIVE STATEMENT: When I took my sleeve off last night my arm was a little achy but overall it's going okay. Just wasn't as comfortable last night.   PERTINENT HISTORY:  Patient was diagnosed on 04/26/2023 with left grade 3 invasive ductal carcinoma breast cancer. It measures 1 cm and is located in the upper outer quadrant. It is ER/PR positive and HER2 negative with a Ki67 of 30%. Pt underwent a L lumpectomy and SLNB 0/2 on 07/02/23  PATIENT GOALS:  Reassess how my recovery is going related to arm function, pain, and swelling.  PAIN:  Are you having pain? No pain just achy in posterior L upper arm  PRECAUTIONS: Recent Surgery, left UE Lymphedema risk,   RED FLAGS: None   ACTIVITY LEVEL / LEISURE: doing post op exercises   OBJECTIVE:   PATIENT SURVEYS:  QUICK DASH:     OBSERVATIONS: Healing scars with some increased scar tissue at L axilla  POSTURE:  Forward head and rounded shoulders posture   UPPER EXTREMITY AROM/PROM:   A/PROM RIGHT   eval    Shoulder extension  48   Shoulder flexion 153  Shoulder abduction 165  Shoulder internal rotation 63  Shoulder external rotation 84                          (Blank rows = not tested)   A/PROM LEFT   eval LEFT  07/25/23 LEFT 08/08/23 LEFT 10/01/23  Shoulder extension 35 70    Shoulder flexion 150 162 171 176  Shoulder abduction 167 156 151 163  Shoulder internal rotation 63 69    Shoulder external rotation 75 80                            (Blank rows = not tested)   CERVICAL AROM: All within normal limits   UPPER EXTREMITY STRENGTH: WFL   LYMPHEDEMA ASSESSMENTS (in cm):    LANDMARK RIGHT   eval  10 cm proximal to olecranon process 38.8  Olecranon process 28.7  10 cm proximal to ulnar styloid process 25.8  Just proximal to ulnar styloid process 17  Across hand at thumb web space 20.5  At base of 2nd digit 5.9  (Blank rows = not tested)   LANDMARK LEFT   eval LEFT 07/25/23  10 cm proximal to olecranon process 40 38.4  Olecranon process 28.8 29.5  10 cm proximal to ulnar styloid process 24.3 23  Just proximal to ulnar styloid process 16.3 16.5  Across hand at thumb web space 19.5 20  At base of 2nd digit 5.9 5.8  (Blank rows = not tested)  Surgery type/Date: 07/02/23 L breast lumpectomy and SLNB Number of lymph nodes removed: 0/2 Current/past treatment (chemo, radiation, hormone therapy): does not need chemo, will require radiation, will need hormone therapy Other symptoms:  Heaviness/tightness Yes Pain Yes Pitting edema No Infections No Decreased scar mobility Yes Stemmer sign No     TREATMENT PERFORMED: 10/21/23: Manual Therapy: MLD to Lt breast and upper arm in supine: Short neck, superficial and deep abdominals, Rt axillary and pectoral nodes and establishment of anterior inter-axillary pathway, Lt inguinal nodes and establishment of axillo-inguinal pathway, then Lt breast moving fluid towards pathways and L axilla spending extra time in any areas of fibrosis then LUE working  proximal to distal and retracing all steps  STM when in Rt sidelying to Lt serratus and lateral trunk, also during P/ROM to same P/ROM to Lt shoulder into flex, abd and D2 with scapular depression by therapist throughout  10/17/23: Manual Therapy: MLD to Lt breast and upper arm in supine: Short neck, superficial and deep abdominals, Rt axillary and pectoral nodes and establishment of anterior inter-axillary pathway, Lt inguinal nodes and establishment of axillo-inguinal pathway, then Lt breast moving fluid towards pathways and L axilla spending extra time in any areas of fibrosis then LUE working proximal to distal and retracing all steps  STM when in Rt sidelying to Lt serratus and lateral trunk Issued instructions for UE MLD 10/15/23: Manual Therapy: MLD to Lt breast and upper arm in supine: Short neck, superficial and deep abdominals, Rt axillary and pectoral nodes and establishment of anterior inter-axillary pathway, Lt inguinal nodes and establishment of axillo-inguinal pathway, then Lt breast moving fluid towards pathways and L axilla spending extra time in any areas of fibrosis then LUE working proximal to distal and retracing all steps  STM when in Rt sidelying to Lt serratus Educated pt to wear her compression sleeve    The patient was assessed  using the L-Dex machine today to produce a lymphedema index baseline score. The patient will be reassessed on a regular basis (typically every 3 months) to obtain new L-Dex scores. If the score is > 6.5 points away from his/her baseline score indicating onset of subclinical lymphedema, it will be recommended to wear a compression garment for 4 weeks, 12 hours per day and then be reassessed. If the score continues to be > 6.5 points from baseline at reassessment, we will initiate lymphedema treatment. Assessing in this manner has a 95% rate of preventing clinically significant lymphedema.  10/10/23: Manual Therapy: MLD to Lt breast in supine: Short  neck, superficial and deep abdominals, Rt axillary and pectoral nodes and establishment of anterior inter-axillary pathway, Lt inguinal nodes and establishment of axillo-inguinal pathway, then Lt breast moving fluid towards pathways and L axilla spending extra time in any areas of fibrosis then into Rt S/L for further focus to latera and inferior breast redirecting towards lateral anastomosis, then back in supine for retracing all steps  STM when in Rt sidelying to Lt serratus and medial scap border using cocoa butter P/ROM to Lt shoulder into flex, abd and D2 with scapular depression by therapist throughout  10/08/23: Manual Therapy: MLD to Lt breast in supine: Short neck, superficial and deep abdominals, Rt axillary and pectoral nodes and establishment of anterior inter-axillary pathway, Lt inguinal nodes and establishment of axillo-inguinal pathway, then Lt breast moving fluid towards pathways and L axilla spending extra time in any areas of fibrosis then into Rt S/L for further focus to latera and inferior breast redirecting towards lateral anastomosis, then back in supine for retracing all steps  STM when in Rt sidelying to Lt serratus and medial scap border using cocoa butter P/ROM to Lt shoulder into flex, abd and D2 with scapular depression by therapist throughout      PATIENT EDUCATION:  Wear compression bra 12 hrs/day for 4 weeks during day time hours Access Code: [email or web address] URL: [email or web address] Date: 08/06/2023 Prepared by: Grayce Sheldon  Exercises - Single Arm Doorway Pec Stretch at 90 Degrees Abduction  - 2 x daily - 7 x weekly - 1 sets - 3 reps - 20-30 hold - Supine Lower Trunk Rotation  - 1 x daily - 7 x weekly - 1 sets - 3 reps - 20-30 hold Education details: nerve desensitization, supine dowel exercises, lymphedema risk reduction and importance of skin care Person educated: Patient Education method: Explanation, Demonstration, Tactile cues, and  Handouts Education comprehension: verbalized understanding and returned demonstration  HOME EXERCISE PROGRAM: Reviewed previously given post op HEP. Supine dowel exercises in to abduction and flexion  ASSESSMENT:  CLINICAL IMPRESSION: Pt reports still some discomfort in upper arm with sleeve but some better. Continued with MLD to Lt upper quadrant and working on decreasing fascial restrictions and tissue tightness.   Pt will benefit from skilled therapeutic intervention to improve on the following deficits: Decreased knowledge of precautions, impaired UE functional use, pain, decreased ROM, postural dysfunction, increased edema.   PT treatment/interventions: ADL/Self care home management, [phone]- PT Re-evaluation, 97110-Therapeutic exercises, 97530- Therapeutic activity, [id number]- Neuromuscular re-education, 97535- Self Care, [id number]- Manual therapy, [id number]- Orthotic Initial, and [id number]- Orthotic/Prosthetic subsequent   GOALS: Goals reviewed with patient? Yes  LONG TERM GOALS:  (STG=LTG)  GOALS Name Target Date  Goal status  1 Pt will demonstrate she has regained full shoulder ROM and function post operatively compared to baselines.  Baseline: 08/22/23 MET  2 Pt will report no pain at end  range of L shoulder abduction to allow improved function. 08/22/23 MET  3 Pt will be independent in a home exercise program for continued stretching and strengthening.  08/22/23 MET  4 Pt will be independent in self MLD for long term management of L breast lymphedema and swelling in L axilla to decrease risk of infection. 10/29/23 NEW     PLAN:  PT FREQUENCY/DURATION: 2x/wk for 4 wks  PLAN FOR NEXT SESSION: Assess indep with self MLD, How was [employer] appt? continue to review self MLD for L breast while performing and axilla and assess indep, STM to L serratus   [facility]  [address], [address]  [place] [address]  [phone]  Self manual lymph drainage: Do  circles behind the collar bones x 10  Perform this sequence once a day.  Only give enough pressure no your skin to make the skin move.  Diaphragmatic - Supine   Inhale through nose making navel move out toward hands. Exhale through puckered lips, hands follow navel in. Repeat _5__ times. Rest _10__ seconds between repeats.   Copyright  VHI. All rights reserved.  Hug yourself.  Do circles at your neck just above your collarbones.  Repeat this 10 times.  Axilla - One at a Time   Using full weight of flat hand and fingers at center of uninvolved (R) armpit, make _10__ in-place circles.   Copyright  VHI. All rights reserved.  LEG: Inguinal Nodes Stimulation   With small finger side of hand against hip crease on involved (L) side, gently perform circles at the crease. Repeat __10_ times.   Copyright  VHI. All rights reserved.  Axilla to Inguinal Nodes - Sweep   On involved side, stretch skin from armpit along side of trunk to hip crease.  Now gently stretch skin from the involved side to the uninvolved side across the chest at the shoulder line.    Move fluid from L armpit in area that feels swollen towards these 2 pathways by gently stretching skin.   Finish by doing circles in R armpit and L groin.   Finish by doing the pathways as described above going from your involved armpit to the same side groin and going across your upper chest from the involved shoulder to the uninvolved shoulder.  Repeat the steps above where you do circles in your left groin and right armpit. Copyright  VHI. All rights reserved.    Aden Berwyn Caldron, PTA 10/21/2023, 5:15 PM

## 2023-10-24 ENCOUNTER — Ambulatory Visit: Payer: Self-pay | Admitting: Physical Therapy

## 2023-10-24 ENCOUNTER — Encounter: Payer: Self-pay | Admitting: Physical Therapy

## 2023-10-24 DIAGNOSIS — Z483 Aftercare following surgery for neoplasm: Secondary | ICD-10-CM

## 2023-10-24 DIAGNOSIS — R293 Abnormal posture: Secondary | ICD-10-CM

## 2023-10-24 DIAGNOSIS — I890 Lymphedema, not elsewhere classified: Secondary | ICD-10-CM | POA: Diagnosis not present

## 2023-10-24 DIAGNOSIS — R600 Localized edema: Secondary | ICD-10-CM

## 2023-10-24 DIAGNOSIS — M25612 Stiffness of left shoulder, not elsewhere classified: Secondary | ICD-10-CM

## 2023-10-24 DIAGNOSIS — Z170 Estrogen receptor positive status [ER+]: Secondary | ICD-10-CM

## 2023-10-24 NOTE — Therapy (Signed)
 OUTPATIENT PHYSICAL THERAPY BREAST CANCER TREATMENT   Patient Name: Norma [name] MRN: [id number] DOB:1970-11-24, 53 y.o., female Today's Date: 10/24/2023  END OF SESSION:  PT End of Session - 10/24/23 0911     Visit Number 14    Number of Visits 15    Date for PT Re-Evaluation 10/29/23    PT Start Time 0905    PT Stop Time 0954    PT Time Calculation (min) 49 min    Activity Tolerance Patient tolerated treatment well    Behavior During Therapy [facility] for tasks assessed/performed            Past Medical History:  Diagnosis Date   Allergy    Anemia    hx of   Anxiety    Arthritis    KNEES   Asthma    treated for during COVID tx   Cancer (HCC) 06/2023   left breast IDC   Chronic headache    Colon polyps    TUBULAR ADENOMAS AND HYPERPLASTIC    Complication of anesthesia    DDD (degenerative disc disease), thoracic    Depression    Diabetes mellitus, type 2 (HCC)    Difficult airway for intubation    Fibromyalgia    GERD (gastroesophageal reflux disease)    Goiter    Hyperlipidemia    Hypertension    Low back pain    Obesity    PONV (postoperative nausea and vomiting)    RA (rheumatoid arthritis) (HCC)    Seasonal allergies    Vitamin D  deficiency    Past Surgical History:  Procedure Laterality Date   ABDOMINAL HYSTERECTOMY  2008   BACK SURGERY  2005   lumbar laminectomy   BALLOON DILATION N/A 06/01/2021   Procedure: BALLOON DILATION;  Surgeon: Albertus Gordy HERO, MD;  Location: [facility];  Service: Gastroenterology;  Laterality: N/A;   BARTHOLIN GLAND CYST EXCISION     BIOPSY  06/01/2021   Procedure: BIOPSY;  Surgeon: Albertus Gordy HERO, MD;  Location: [facility];  Service: Gastroenterology;;   BREAST LUMPECTOMY WITH RADIOACTIVE SEED AND SENTINEL LYMPH NODE BIOPSY Left 07/02/2023   Procedure: BREAST LUMPECTOMY WITH RADIOACTIVE SEED AND SENTINEL LYMPH NODE BIOPSY;  Surgeon: Aron Shoulders, MD;  Location: [facility];  Service: General;   Laterality: Left;  90 MIN 78444- EXCISION LEFT CHEST WALL MASS GEN COMBINED WITH REGIONAL   COLONOSCOPY  2017   JMP-MAC-prep good-TA -recall 5 yr   COLONOSCOPY WITH PROPOFOL  N/A 06/01/2021   Procedure: COLONOSCOPY WITH PROPOFOL ;  Surgeon: Albertus Gordy HERO, MD;  Location: [facility];  Service: Gastroenterology;  Laterality: N/A;   ESOPHAGOGASTRODUODENOSCOPY (EGD) WITH PROPOFOL  N/A 06/01/2021   Procedure: ESOPHAGOGASTRODUODENOSCOPY (EGD) WITH PROPOFOL ;  Surgeon: Albertus Gordy HERO, MD;  Location: [facility];  Service: Gastroenterology;  Laterality: N/A;   LUMBAR LAMINECTOMY/DECOMPRESSION MICRODISCECTOMY Right 07/17/2013   Procedure: LUMBAR LAMINECTOMY/DECOMPRESSION MICRODISCECTOMY 1 LEVEL,RIGHT LUMBAR FOUR-FIVE;  Surgeon: Darina MALVA Boehringer, MD;  Location: [facility];  Service: Neurosurgery;  Laterality: Right;  Right   MASS EXCISION Left 07/02/2023   Procedure: EXCISION, MASS, CHEST WALL;  Surgeon: Aron Shoulders, MD;  Location: [facility];  Service: General;  Laterality: Left;   PARTIAL HYSTERECTOMY     POLYPECTOMY  2017   TA and benign polypoid   POLYPECTOMY  06/01/2021   Procedure: POLYPECTOMY;  Surgeon: Albertus Gordy HERO, MD;  Location: [facility];  Service: Gastroenterology;;   WISDOM TOOTH EXTRACTION     Patient Active Problem List  Diagnosis Date Noted   Genetic testing 06/23/2023   Malignant neoplasm of upper-outer quadrant of left breast in female, estrogen receptor positive (HCC) 06/11/2023   Gastritis without bleeding    Esophageal dysphagia    Schatzki's ring    Benign neoplasm of ascending colon    Class 3 severe obesity with serious comorbidity and body mass index (BMI) of 40.0 to 44.9 in adult 08/25/2019   Type 2 diabetes mellitus without complication, without long-term current use of insulin (HCC) 08/25/2019   Chronic cough 06/27/2017   LUQ abdominal pain 12/07/2014   RUQ abdominal pain 11/10/2013   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   Gastroesophageal reflux  disease 12/18/2012   Hx of adenomatous colonic polyps 12/18/2012   Chronic RLQ pain 12/18/2012   IBS (irritable bowel syndrome) 01/22/2011   HTN (hypertension) 01/22/2011   Hyperlipidemia 01/22/2011   Anxiety and depression 01/22/2011    PCP: Haze Servant, NP  REFERRING PROVIDER: Dr. Jina Nephew   REFERRING DIAG: Left breast cancer  THERAPY DIAG:  Lymphedema, not elsewhere classified  Localized edema  Stiffness of left shoulder, not elsewhere classified  Aftercare following surgery for neoplasm  Abnormal posture  Malignant neoplasm of upper-outer quadrant of left breast in female, estrogen receptor positive (HCC)  Rationale for Evaluation and Treatment: Rehabilitation  ONSET DATE: 04/26/23  SUBJECTIVE:                                                                                                                                                                                           SUBJECTIVE STATEMENT: I am having a lot of pain with my L arm. It is going down to my forearm. The side of my breast is hurting. I wore a tank top bra I got from [employer] and my breast did not like that at all. This morning when I got up and put my arm down by my side it felt like there was swelling under my arm.   PERTINENT HISTORY:  Patient was diagnosed on 04/26/2023 with left grade 3 invasive ductal carcinoma breast cancer. It measures 1 cm and is located in the upper outer quadrant. It is ER/PR positive and HER2 negative with a Ki67 of 30%. Pt underwent a L lumpectomy and SLNB 0/2 on 07/02/23  PATIENT GOALS:  Reassess how my recovery is going related to arm function, pain, and swelling.  PAIN:  Are you having pain? No pain just achy in posterior L upper arm  PRECAUTIONS: Recent Surgery, left UE Lymphedema risk,   RED FLAGS: None   ACTIVITY LEVEL / LEISURE: doing post op exercises   OBJECTIVE:   PATIENT  SURVEYS:  QUICK DASH:     OBSERVATIONS: Healing scars with  some increased scar tissue at L axilla  POSTURE:  Forward head and rounded shoulders posture   UPPER EXTREMITY AROM/PROM:   A/PROM RIGHT   eval    Shoulder extension 48  Shoulder flexion 153  Shoulder abduction 165  Shoulder internal rotation 63  Shoulder external rotation 84                          (Blank rows = not tested)   A/PROM LEFT   eval LEFT  07/25/23 LEFT 08/08/23 LEFT 10/01/23  Shoulder extension 35 70    Shoulder flexion 150 162 171 176  Shoulder abduction 167 156 151 163  Shoulder internal rotation 63 69    Shoulder external rotation 75 80                            (Blank rows = not tested)   CERVICAL AROM: All within normal limits   UPPER EXTREMITY STRENGTH: WFL   LYMPHEDEMA ASSESSMENTS (in cm):    LANDMARK RIGHT   eval  10 cm proximal to olecranon process 38.8  Olecranon process 28.7  10 cm proximal to ulnar styloid process 25.8  Just proximal to ulnar styloid process 17  Across hand at thumb web space 20.5  At base of 2nd digit 5.9  (Blank rows = not tested)   LANDMARK LEFT   eval LEFT 07/25/23  10 cm proximal to olecranon process 40 38.4  Olecranon process 28.8 29.5  10 cm proximal to ulnar styloid process 24.3 23  Just proximal to ulnar styloid process 16.3 16.5  Across hand at thumb web space 19.5 20  At base of 2nd digit 5.9 5.8  (Blank rows = not tested)  Surgery type/Date: 07/02/23 L breast lumpectomy and SLNB Number of lymph nodes removed: 0/2 Current/past treatment (chemo, radiation, hormone therapy): does not need chemo, will require radiation, will need hormone therapy Other symptoms:  Heaviness/tightness Yes Pain Yes Pitting edema No Infections No Decreased scar mobility Yes Stemmer sign No     TREATMENT PERFORMED: 10/24/23: Manual Therapy: Instructed pt in each step of MLD and had her return demonstrate each step with verbal and tactile cues for speed, pressure, direction of stretch and skin stretch technique. Pt  completed first half with cueing and therapist completed second half. MLD to Lt breast and upper arm in supine: Short neck, superficial and deep abdominals, Rt axillary and pectoral nodes and establishment of anterior inter-axillary pathway, Lt inguinal nodes and establishment of axillo-inguinal pathway, then Lt breast moving fluid towards pathways and L axilla spending extra time in any areas of fibrosis then LUE working proximal to distal and retracing all steps   10/21/23: Manual Therapy: MLD to Lt breast and upper arm in supine: Short neck, superficial and deep abdominals, Rt axillary and pectoral nodes and establishment of anterior inter-axillary pathway, Lt inguinal nodes and establishment of axillo-inguinal pathway, then Lt breast moving fluid towards pathways and L axilla spending extra time in any areas of fibrosis then LUE working proximal to distal and retracing all steps  STM when in Rt sidelying to Lt serratus and lateral trunk, also during P/ROM to same P/ROM to Lt shoulder into flex, abd and D2 with scapular depression by therapist throughout  10/17/23: Manual Therapy: MLD to Lt breast and upper arm in supine: Short neck, superficial and deep abdominals,  Rt axillary and pectoral nodes and establishment of anterior inter-axillary pathway, Lt inguinal nodes and establishment of axillo-inguinal pathway, then Lt breast moving fluid towards pathways and L axilla spending extra time in any areas of fibrosis then LUE working proximal to distal and retracing all steps  STM when in Rt sidelying to Lt serratus and lateral trunk Issued instructions for UE MLD 10/15/23: Manual Therapy: MLD to Lt breast and upper arm in supine: Short neck, superficial and deep abdominals, Rt axillary and pectoral nodes and establishment of anterior inter-axillary pathway, Lt inguinal nodes and establishment of axillo-inguinal pathway, then Lt breast moving fluid towards pathways and L axilla spending extra time in any  areas of fibrosis then LUE working proximal to distal and retracing all steps  STM when in Rt sidelying to Lt serratus Educated pt to wear her compression sleeve    The patient was assessed using the L-Dex machine today to produce a lymphedema index baseline score. The patient will be reassessed on a regular basis (typically every 3 months) to obtain new L-Dex scores. If the score is > 6.5 points away from his/her baseline score indicating onset of subclinical lymphedema, it will be recommended to wear a compression garment for 4 weeks, 12 hours per day and then be reassessed. If the score continues to be > 6.5 points from baseline at reassessment, we will initiate lymphedema treatment. Assessing in this manner has a 95% rate of preventing clinically significant lymphedema.  10/10/23: Manual Therapy: MLD to Lt breast in supine: Short neck, superficial and deep abdominals, Rt axillary and pectoral nodes and establishment of anterior inter-axillary pathway, Lt inguinal nodes and establishment of axillo-inguinal pathway, then Lt breast moving fluid towards pathways and L axilla spending extra time in any areas of fibrosis then into Rt S/L for further focus to latera and inferior breast redirecting towards lateral anastomosis, then back in supine for retracing all steps  STM when in Rt sidelying to Lt serratus and medial scap border using cocoa butter P/ROM to Lt shoulder into flex, abd and D2 with scapular depression by therapist throughout  10/08/23: Manual Therapy: MLD to Lt breast in supine: Short neck, superficial and deep abdominals, Rt axillary and pectoral nodes and establishment of anterior inter-axillary pathway, Lt inguinal nodes and establishment of axillo-inguinal pathway, then Lt breast moving fluid towards pathways and L axilla spending extra time in any areas of fibrosis then into Rt S/L for further focus to latera and inferior breast redirecting towards lateral anastomosis, then back in  supine for retracing all steps  STM when in Rt sidelying to Lt serratus and medial scap border using cocoa butter P/ROM to Lt shoulder into flex, abd and D2 with scapular depression by therapist throughout      PATIENT EDUCATION:  Wear compression bra 12 hrs/day for 4 weeks during day time hours Access Code: [email or web address] URL: [email or web address] Date: 08/06/2023 Prepared by: Grayce Sheldon  Exercises - Single Arm Doorway Pec Stretch at 90 Degrees Abduction  - 2 x daily - 7 x weekly - 1 sets - 3 reps - 20-30 hold - Supine Lower Trunk Rotation  - 1 x daily - 7 x weekly - 1 sets - 3 reps - 20-30 hold Education details: nerve desensitization, supine dowel exercises, lymphedema risk reduction and importance of skin care Person educated: Patient Education method: Explanation, Demonstration, Tactile cues, and Handouts Education comprehension: verbalized understanding and returned demonstration  HOME EXERCISE PROGRAM: Reviewed previously given post op HEP. Supine dowel exercises in to  abduction and flexion  ASSESSMENT:  CLINICAL IMPRESSION: Pt continues to have increased discomfort in her upper arm. She also feels increased fullness at lateral breast especially when she wakes up in the  morning. Focused this session on ensuring pt is doing self MLD correctly. Had her return demo each step today and provided mod/max v/c and t/c for correct technique.   Pt will benefit from skilled therapeutic intervention to improve on the following deficits: Decreased knowledge of precautions, impaired UE functional use, pain, decreased ROM, postural dysfunction, increased edema.   PT treatment/interventions: ADL/Self care home management, [id number]- PT Re-evaluation, 97110-Therapeutic exercises, 97530- Therapeutic activity, [id number]- Neuromuscular re-education, 97535- Self Care, 02859- Manual therapy, [id number]- Orthotic Initial, and [id number]- Orthotic/Prosthetic subsequent   GOALS: Goals reviewed with  patient? Yes  LONG TERM GOALS:  (STG=LTG)  GOALS Name Target Date  Goal status  1 Pt will demonstrate she has regained full shoulder ROM and function post operatively compared to baselines.  Baseline: 08/22/23 MET  2 Pt will report no pain at end range of L shoulder abduction to allow improved function. 08/22/23 MET  3 Pt will be independent in a home exercise program for continued stretching and strengthening.  08/22/23 MET  4 Pt will be independent in self MLD for long term management of L breast lymphedema and swelling in L axilla to decrease risk of infection. 10/29/23 NEW     PLAN:  PT FREQUENCY/DURATION: 2x/wk for 4 wks  PLAN FOR NEXT SESSION: Update POC, assess indep with self MLD, How was [employer] appt? continue to review self MLD for L breast while performing and axilla and assess indep, STM to L serratus   [facility]  [address], [address]  [place] [address]  [phone]  Self manual lymph drainage: Do circles behind the collar bones x 10  Perform this sequence once a day.  Only give enough pressure no your skin to make the skin move.  Diaphragmatic - Supine   Inhale through nose making navel move out toward hands. Exhale through puckered lips, hands follow navel in. Repeat _5__ times. Rest _10__ seconds between repeats.   Copyright  VHI. All rights reserved.  Hug yourself.  Do circles at your neck just above your collarbones.  Repeat this 10 times.  Axilla - One at a Time   Using full weight of flat hand and fingers at center of uninvolved (R) armpit, make _10__ in-place circles.   Copyright  VHI. All rights reserved.  LEG: Inguinal Nodes Stimulation   With small finger side of hand against hip crease on involved (L) side, gently perform circles at the crease. Repeat __10_ times.   Copyright  VHI. All rights reserved.  Axilla to Inguinal Nodes - Sweep   On involved side, stretch skin from armpit along side of trunk  to hip crease.  Now gently stretch skin from the involved side to the uninvolved side across the chest at the shoulder line.    Move fluid from L armpit in area that feels swollen towards these 2 pathways by gently stretching skin.   Finish by doing circles in R armpit and L groin.   Finish by doing the pathways as described above going from your involved armpit to the same side groin and going across your upper chest from the involved shoulder to the uninvolved shoulder.  Repeat the steps above where you do circles in your left groin and right armpit. Copyright  VHI. All rights reserved.  [facility] [place], PT 10/24/2023, 9:58 AM

## 2023-10-29 ENCOUNTER — Encounter: Payer: Self-pay | Admitting: Hematology and Oncology

## 2023-10-29 ENCOUNTER — Ambulatory Visit: Payer: Self-pay | Admitting: Physical Therapy

## 2023-10-29 ENCOUNTER — Encounter: Payer: Self-pay | Admitting: Physical Therapy

## 2023-10-29 DIAGNOSIS — R293 Abnormal posture: Secondary | ICD-10-CM

## 2023-10-29 DIAGNOSIS — I890 Lymphedema, not elsewhere classified: Secondary | ICD-10-CM

## 2023-10-29 DIAGNOSIS — Z483 Aftercare following surgery for neoplasm: Secondary | ICD-10-CM

## 2023-10-29 DIAGNOSIS — C50412 Malignant neoplasm of upper-outer quadrant of left female breast: Secondary | ICD-10-CM

## 2023-10-29 DIAGNOSIS — M25612 Stiffness of left shoulder, not elsewhere classified: Secondary | ICD-10-CM

## 2023-10-29 NOTE — Therapy (Signed)
 OUTPATIENT PHYSICAL THERAPY BREAST CANCER TREATMENT   Patient Name: Norma [name] MRN: [id number] DOB:08/16/1970, 53 y.o., female Today's Date: 10/29/2023  END OF SESSION:  PT End of Session - 10/29/23 1601     Visit Number 15    Number of Visits 23    Date for PT Re-Evaluation 11/26/23    PT Start Time 1601    Activity Tolerance Patient tolerated treatment well    Behavior During Therapy [facility] for tasks assessed/performed            Past Medical History:  Diagnosis Date   Allergy    Anemia    hx of   Anxiety    Arthritis    KNEES   Asthma    treated for during COVID tx   Cancer (HCC) 06/2023   left breast IDC   Chronic headache    Colon polyps    TUBULAR ADENOMAS AND HYPERPLASTIC    Complication of anesthesia    DDD (degenerative disc disease), thoracic    Depression    Diabetes mellitus, type 2 (HCC)    Difficult airway for intubation    Fibromyalgia    GERD (gastroesophageal reflux disease)    Goiter    Hyperlipidemia    Hypertension    Low back pain    Obesity    PONV (postoperative nausea and vomiting)    RA (rheumatoid arthritis) (HCC)    Seasonal allergies    Vitamin D  deficiency    Past Surgical History:  Procedure Laterality Date   ABDOMINAL HYSTERECTOMY  2008   BACK SURGERY  2005   lumbar laminectomy   BALLOON DILATION N/A 06/01/2021   Procedure: BALLOON DILATION;  Surgeon: Albertus Gordy HERO, MD;  Location: [facility];  Service: Gastroenterology;  Laterality: N/A;   BARTHOLIN GLAND CYST EXCISION     BIOPSY  06/01/2021   Procedure: BIOPSY;  Surgeon: Albertus Gordy HERO, MD;  Location: [facility];  Service: Gastroenterology;;   BREAST LUMPECTOMY WITH RADIOACTIVE SEED AND SENTINEL LYMPH NODE BIOPSY Left 07/02/2023   Procedure: BREAST LUMPECTOMY WITH RADIOACTIVE SEED AND SENTINEL LYMPH NODE BIOPSY;  Surgeon: Aron Shoulders, MD;  Location: [facility];  Service: General;  Laterality: Left;  90 MIN 78444- EXCISION LEFT CHEST WALL MASS GEN  COMBINED WITH REGIONAL   COLONOSCOPY  2017   JMP-MAC-prep good-TA -recall 5 yr   COLONOSCOPY WITH PROPOFOL  N/A 06/01/2021   Procedure: COLONOSCOPY WITH PROPOFOL ;  Surgeon: Albertus Gordy HERO, MD;  Location: [facility];  Service: Gastroenterology;  Laterality: N/A;   ESOPHAGOGASTRODUODENOSCOPY (EGD) WITH PROPOFOL  N/A 06/01/2021   Procedure: ESOPHAGOGASTRODUODENOSCOPY (EGD) WITH PROPOFOL ;  Surgeon: Albertus Gordy HERO, MD;  Location: [facility];  Service: Gastroenterology;  Laterality: N/A;   LUMBAR LAMINECTOMY/DECOMPRESSION MICRODISCECTOMY Right 07/17/2013   Procedure: LUMBAR LAMINECTOMY/DECOMPRESSION MICRODISCECTOMY 1 LEVEL,RIGHT LUMBAR FOUR-FIVE;  Surgeon: Darina MALVA Boehringer, MD;  Location: [facility];  Service: Neurosurgery;  Laterality: Right;  Right   MASS EXCISION Left 07/02/2023   Procedure: EXCISION, MASS, CHEST WALL;  Surgeon: Aron Shoulders, MD;  Location: [facility];  Service: General;  Laterality: Left;   PARTIAL HYSTERECTOMY     POLYPECTOMY  2017   TA and benign polypoid   POLYPECTOMY  06/01/2021   Procedure: POLYPECTOMY;  Surgeon: Albertus Gordy HERO, MD;  Location: [facility];  Service: Gastroenterology;;   WISDOM TOOTH EXTRACTION     Patient Active Problem List   Diagnosis Date Noted   Genetic testing 06/23/2023   Malignant neoplasm of upper-outer quadrant of  left breast in female, estrogen receptor positive (HCC) 06/11/2023   Gastritis without bleeding    Esophageal dysphagia    Schatzki's ring    Benign neoplasm of ascending colon    Class 3 severe obesity with serious comorbidity and body mass index (BMI) of 40.0 to 44.9 in adult 08/25/2019   Type 2 diabetes mellitus without complication, without long-term current use of insulin (HCC) 08/25/2019   Chronic cough 06/27/2017   LUQ abdominal pain 12/07/2014   RUQ abdominal pain 11/10/2013   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   Gastroesophageal reflux disease 12/18/2012   Hx of adenomatous colonic polyps 12/18/2012    Chronic RLQ pain 12/18/2012   IBS (irritable bowel syndrome) 01/22/2011   HTN (hypertension) 01/22/2011   Hyperlipidemia 01/22/2011   Anxiety and depression 01/22/2011    PCP: Haze Servant, NP  REFERRING PROVIDER: Dr. Jina Nephew   REFERRING DIAG: Left breast cancer  THERAPY DIAG:  Lymphedema, not elsewhere classified - Plan: PT plan of care cert/re-cert  Stiffness of left shoulder, not elsewhere classified - Plan: PT plan of care cert/re-cert  Aftercare following surgery for neoplasm - Plan: PT plan of care cert/re-cert  Abnormal posture - Plan: PT plan of care cert/re-cert  Malignant neoplasm of upper-outer quadrant of left breast in female, estrogen receptor positive (HCC) - Plan: PT plan of care cert/re-cert  Rationale for Evaluation and Treatment: Rehabilitation  ONSET DATE: 04/26/23  SUBJECTIVE:                                                                                                                                                                                           SUBJECTIVE STATEMENT: I feel like my breast and arm are getting worse. My breast felt like it was going to pop the other night. The bra makes it feel better when it is on. I did self massage on Friday and Saturday. Sunday I was so frustrated I did not do it and Monday I was too busy.   PERTINENT HISTORY:  Patient was diagnosed on 04/26/2023 with left grade 3 invasive ductal carcinoma breast cancer. It measures 1 cm and is located in the upper outer quadrant. It is ER/PR positive and HER2 negative with a Ki67 of 30%. Pt underwent a L lumpectomy and SLNB 0/2 on 07/02/23  PATIENT GOALS:  Reassess how my recovery is going related to arm function, pain, and swelling.  PAIN:  Are you having pain? Yes 2/10, L breast, burning, heavy, arm feels heavy as well  PRECAUTIONS: Recent Surgery, left UE Lymphedema risk,   RED FLAGS: None   ACTIVITY LEVEL / LEISURE: doing post op exercises  OBJECTIVE:    PATIENT SURVEYS:  QUICK DASH:     OBSERVATIONS: Healing scars with some increased scar tissue at L axilla  POSTURE:  Forward head and rounded shoulders posture   UPPER EXTREMITY AROM/PROM:   A/PROM RIGHT   eval    Shoulder extension 48  Shoulder flexion 153  Shoulder abduction 165  Shoulder internal rotation 63  Shoulder external rotation 84                          (Blank rows = not tested)   A/PROM LEFT   eval LEFT  07/25/23 LEFT 08/08/23 LEFT 10/01/23  Shoulder extension 35 70    Shoulder flexion 150 162 171 176  Shoulder abduction 167 156 151 163  Shoulder internal rotation 63 69    Shoulder external rotation 75 80                            (Blank rows = not tested)   CERVICAL AROM: All within normal limits   UPPER EXTREMITY STRENGTH: WFL   LYMPHEDEMA ASSESSMENTS (in cm):    LANDMARK RIGHT   eval  10 cm proximal to olecranon process 38.8  Olecranon process 28.7  10 cm proximal to ulnar styloid process 25.8  Just proximal to ulnar styloid process 17  Across hand at thumb web space 20.5  At base of 2nd digit 5.9  (Blank rows = not tested)   LANDMARK LEFT   eval LEFT 07/25/23  10 cm proximal to olecranon process 40 38.4  Olecranon process 28.8 29.5  10 cm proximal to ulnar styloid process 24.3 23  Just proximal to ulnar styloid process 16.3 16.5  Across hand at thumb web space 19.5 20  At base of 2nd digit 5.9 5.8  (Blank rows = not tested)  Surgery type/Date: 07/02/23 L breast lumpectomy and SLNB Number of lymph nodes removed: 0/2 Current/past treatment (chemo, radiation, hormone therapy): does not need chemo, will require radiation, will need hormone therapy Other symptoms:  Heaviness/tightness Yes Pain Yes Pitting edema No Infections No Decreased scar mobility Yes Stemmer sign No     TREATMENT PERFORMED: 10/29/23: Manual Therapy: In supine: Short neck, 5 diaphragmatic breaths, R axillary nodes and establishment of interaxillary  pathway, L inguinal nodes and establishment of axilloinguinal pathway, then L breast moving fluid towards pathways spending extra time in any areas of fibrosis then retracing all steps. MFR to numerous cords in L axilla extending down to upper arm with L UE in 100 degrees of abduction 10/24/23: Manual Therapy: Instructed pt in each step of MLD and had her return demonstrate each step with verbal and tactile cues for speed, pressure, direction of stretch and skin stretch technique. Pt completed first half with cueing and therapist completed second half. MLD to Lt breast and upper arm in supine: Short neck, superficial and deep abdominals, Rt axillary and pectoral nodes and establishment of anterior inter-axillary pathway, Lt inguinal nodes and establishment of axillo-inguinal pathway, then Lt breast moving fluid towards pathways and L axilla spending extra time in any areas of fibrosis then LUE working proximal to distal and retracing all steps   10/21/23: Manual Therapy: MLD to Lt breast and upper arm in supine: Short neck, superficial and deep abdominals, Rt axillary and pectoral nodes and establishment of anterior inter-axillary pathway, Lt inguinal nodes and establishment of axillo-inguinal pathway, then Lt breast moving fluid towards pathways and L axilla  spending extra time in any areas of fibrosis then LUE working proximal to distal and retracing all steps  STM when in Rt sidelying to Lt serratus and lateral trunk, also during P/ROM to same P/ROM to Lt shoulder into flex, abd and D2 with scapular depression by therapist throughout  10/17/23: Manual Therapy: MLD to Lt breast and upper arm in supine: Short neck, superficial and deep abdominals, Rt axillary and pectoral nodes and establishment of anterior inter-axillary pathway, Lt inguinal nodes and establishment of axillo-inguinal pathway, then Lt breast moving fluid towards pathways and L axilla spending extra time in any areas of fibrosis then LUE  working proximal to distal and retracing all steps  STM when in Rt sidelying to Lt serratus and lateral trunk Issued instructions for UE MLD 10/15/23: Manual Therapy: MLD to Lt breast and upper arm in supine: Short neck, superficial and deep abdominals, Rt axillary and pectoral nodes and establishment of anterior inter-axillary pathway, Lt inguinal nodes and establishment of axillo-inguinal pathway, then Lt breast moving fluid towards pathways and L axilla spending extra time in any areas of fibrosis then LUE working proximal to distal and retracing all steps  STM when in Rt sidelying to Lt serratus Educated pt to wear her compression sleeve    The patient was assessed using the L-Dex machine today to produce a lymphedema index baseline score. The patient will be reassessed on a regular basis (typically every 3 months) to obtain new L-Dex scores. If the score is > 6.5 points away from his/her baseline score indicating onset of subclinical lymphedema, it will be recommended to wear a compression garment for 4 weeks, 12 hours per day and then be reassessed. If the score continues to be > 6.5 points from baseline at reassessment, we will initiate lymphedema treatment. Assessing in this manner has a 95% rate of preventing clinically significant lymphedema.  10/10/23: Manual Therapy: MLD to Lt breast in supine: Short neck, superficial and deep abdominals, Rt axillary and pectoral nodes and establishment of anterior inter-axillary pathway, Lt inguinal nodes and establishment of axillo-inguinal pathway, then Lt breast moving fluid towards pathways and L axilla spending extra time in any areas of fibrosis then into Rt S/L for further focus to latera and inferior breast redirecting towards lateral anastomosis, then back in supine for retracing all steps  STM when in Rt sidelying to Lt serratus and medial scap border using cocoa butter P/ROM to Lt shoulder into flex, abd and D2 with scapular depression by  therapist throughout  10/08/23: Manual Therapy: MLD to Lt breast in supine: Short neck, superficial and deep abdominals, Rt axillary and pectoral nodes and establishment of anterior inter-axillary pathway, Lt inguinal nodes and establishment of axillo-inguinal pathway, then Lt breast moving fluid towards pathways and L axilla spending extra time in any areas of fibrosis then into Rt S/L for further focus to latera and inferior breast redirecting towards lateral anastomosis, then back in supine for retracing all steps  STM when in Rt sidelying to Lt serratus and medial scap border using cocoa butter P/ROM to Lt shoulder into flex, abd and D2 with scapular depression by therapist throughout      PATIENT EDUCATION:  Wear compression bra 12 hrs/day for 4 weeks during day time hours Access Code: [email or web address] URL: [email or web address] Date: 08/06/2023 Prepared by: Grayce Sheldon  Exercises - Single Arm Doorway Pec Stretch at 90 Degrees Abduction  - 2 x daily - 7 x weekly - 1 sets - 3 reps - 20-30 hold - Supine  Lower Trunk Rotation  - 1 x daily - 7 x weekly - 1 sets - 3 reps - 20-30 hold Education details: nerve desensitization, supine dowel exercises, lymphedema risk reduction and importance of skin care Person educated: Patient Education method: Explanation, Demonstration, Tactile cues, and Handouts Education comprehension: verbalized understanding and returned demonstration  HOME EXERCISE PROGRAM: Reviewed previously given post op HEP. Supine dowel exercises in to abduction and flexion  ASSESSMENT:  CLINICAL IMPRESSION: Therapist able to palpate cording in L axilla today which has been causing pt pain in her axilla and upper arm. She reports her breast swelling has been worse but there is currently no sign of infection. Educated pt that if this continues for another week then she may need to go back to Dr Aron to get checked. Began MFR to cording today and added additional  goals to address cording and breast pain. She would benefit from additional skilled PT services to decrease L breast lymphedema and decrease L breast and UE pain.   Pt will benefit from skilled therapeutic intervention to improve on the following deficits: Decreased knowledge of precautions, impaired UE functional use, pain, decreased ROM, postural dysfunction, increased edema.   PT treatment/interventions: ADL/Self care home management, [id number]- PT Re-evaluation, 97110-Therapeutic exercises, 97530- Therapeutic activity, [id number]- Neuromuscular re-education, 97535- Self Care, [id number]- Manual therapy, [id number]- Orthotic Initial, and [id number]- Orthotic/Prosthetic subsequent   GOALS: Goals reviewed with patient? Yes  LONG TERM GOALS:  (STG=LTG)  GOALS Name Target Date  Goal status  1 Pt will demonstrate she has regained full shoulder ROM and function post operatively compared to baselines.  Baseline: 08/22/23 MET  2 Pt will report no pain at end range of L shoulder abduction to allow improved function. 08/22/23 MET  3 Pt will be independent in a home exercise program for continued stretching and strengthening.  08/22/23 MET  4 Pt will be independent in self MLD for long term management of L breast lymphedema and swelling in L axilla to decrease risk of infection. 10/29/23 MET 10/29/23  5 Pt will report a 50% improvement in pain in L breast to allow improved comfort.  11/26/23 NEW  6 Pt will return to the green on the SOZO demonstrating reversal of subclinical lymphedema.  11/26/23 NEW  7 Pt will demonstrate a 75% improvement in pain and discomfort of L axilla from cording to allow improved comfort and function 11/26/23 NEW     PLAN:  PT FREQUENCY/DURATION: 2x/wk for 4 wks  PLAN FOR NEXT SESSION: MFR to cording in L axilla,  Continue MLD to LUE and L breast to reduce pain  [facility]  [address], [address]  [place] [address]  [phone]  Self manual lymph drainage: Do  circles behind the collar bones x 10  Perform this sequence once a day.  Only give enough pressure no your skin to make the skin move.  Diaphragmatic - Supine   Inhale through nose making navel move out toward hands. Exhale through puckered lips, hands follow navel in. Repeat _5__ times. Rest _10__ seconds between repeats.   Copyright  VHI. All rights reserved.  Hug yourself.  Do circles at your neck just above your collarbones.  Repeat this 10 times.  Axilla - One at a Time   Using full weight of flat hand and fingers at center of uninvolved (R) armpit, make _10__ in-place circles.   Copyright  VHI. All rights reserved.  LEG: Inguinal Nodes Stimulation   With small finger side of  hand against hip crease on involved (L) side, gently perform circles at the crease. Repeat __10_ times.   Copyright  VHI. All rights reserved.  Axilla to Inguinal Nodes - Sweep   On involved side, stretch skin from armpit along side of trunk to hip crease.  Now gently stretch skin from the involved side to the uninvolved side across the chest at the shoulder line.    Move fluid from L armpit in area that feels swollen towards these 2 pathways by gently stretching skin.   Finish by doing circles in R armpit and L groin.   Finish by doing the pathways as described above going from your involved armpit to the same side groin and going across your upper chest from the involved shoulder to the uninvolved shoulder.  Repeat the steps above where you do circles in your left groin and right armpit. Copyright  VHI. All rights reserved.    [facility] Hallock, PT 10/29/2023, 5:01 PM

## 2023-10-31 ENCOUNTER — Ambulatory Visit: Payer: Self-pay

## 2023-10-31 DIAGNOSIS — Z483 Aftercare following surgery for neoplasm: Secondary | ICD-10-CM

## 2023-10-31 DIAGNOSIS — I890 Lymphedema, not elsewhere classified: Secondary | ICD-10-CM

## 2023-10-31 DIAGNOSIS — R293 Abnormal posture: Secondary | ICD-10-CM

## 2023-10-31 DIAGNOSIS — M25612 Stiffness of left shoulder, not elsewhere classified: Secondary | ICD-10-CM

## 2023-10-31 DIAGNOSIS — Z170 Estrogen receptor positive status [ER+]: Secondary | ICD-10-CM

## 2023-10-31 NOTE — Therapy (Signed)
 OUTPATIENT PHYSICAL THERAPY BREAST CANCER TREATMENT   Patient Name: Norma [name] MRN: [id number] DOB:1970/08/17, 53 y.o., female Today's Date: 10/31/2023  END OF SESSION:  PT End of Session - 10/31/23 0910     Visit Number 16    Number of Visits 23    Date for PT Re-Evaluation 11/26/23    PT Start Time 0906    PT Stop Time 1000    PT Time Calculation (min) 54 min    Activity Tolerance Patient tolerated treatment well    Behavior During Therapy [facility] for tasks assessed/performed            Past Medical History:  Diagnosis Date   Allergy    Anemia    hx of   Anxiety    Arthritis    KNEES   Asthma    treated for during COVID tx   Cancer (HCC) 06/2023   left breast IDC   Chronic headache    Colon polyps    TUBULAR ADENOMAS AND HYPERPLASTIC    Complication of anesthesia    DDD (degenerative disc disease), thoracic    Depression    Diabetes mellitus, type 2 (HCC)    Difficult airway for intubation    Fibromyalgia    GERD (gastroesophageal reflux disease)    Goiter    Hyperlipidemia    Hypertension    Low back pain    Obesity    PONV (postoperative nausea and vomiting)    RA (rheumatoid arthritis) (HCC)    Seasonal allergies    Vitamin D  deficiency    Past Surgical History:  Procedure Laterality Date   ABDOMINAL HYSTERECTOMY  2008   BACK SURGERY  2005   lumbar laminectomy   BALLOON DILATION N/A 06/01/2021   Procedure: BALLOON DILATION;  Surgeon: Albertus Gordy HERO, MD;  Location: [facility];  Service: Gastroenterology;  Laterality: N/A;   BARTHOLIN GLAND CYST EXCISION     BIOPSY  06/01/2021   Procedure: BIOPSY;  Surgeon: Albertus Gordy HERO, MD;  Location: [facility];  Service: Gastroenterology;;   BREAST LUMPECTOMY WITH RADIOACTIVE SEED AND SENTINEL LYMPH NODE BIOPSY Left 07/02/2023   Procedure: BREAST LUMPECTOMY WITH RADIOACTIVE SEED AND SENTINEL LYMPH NODE BIOPSY;  Surgeon: Aron Shoulders, MD;  Location: [facility];  Service: General;   Laterality: Left;  90 MIN 78444- EXCISION LEFT CHEST WALL MASS GEN COMBINED WITH REGIONAL   COLONOSCOPY  2017   JMP-MAC-prep good-TA -recall 5 yr   COLONOSCOPY WITH PROPOFOL  N/A 06/01/2021   Procedure: COLONOSCOPY WITH PROPOFOL ;  Surgeon: Albertus Gordy HERO, MD;  Location: [facility];  Service: Gastroenterology;  Laterality: N/A;   ESOPHAGOGASTRODUODENOSCOPY (EGD) WITH PROPOFOL  N/A 06/01/2021   Procedure: ESOPHAGOGASTRODUODENOSCOPY (EGD) WITH PROPOFOL ;  Surgeon: Albertus Gordy HERO, MD;  Location: [facility];  Service: Gastroenterology;  Laterality: N/A;   LUMBAR LAMINECTOMY/DECOMPRESSION MICRODISCECTOMY Right 07/17/2013   Procedure: LUMBAR LAMINECTOMY/DECOMPRESSION MICRODISCECTOMY 1 LEVEL,RIGHT LUMBAR FOUR-FIVE;  Surgeon: Darina MALVA Boehringer, MD;  Location: [facility];  Service: Neurosurgery;  Laterality: Right;  Right   MASS EXCISION Left 07/02/2023   Procedure: EXCISION, MASS, CHEST WALL;  Surgeon: Aron Shoulders, MD;  Location: [facility];  Service: General;  Laterality: Left;   PARTIAL HYSTERECTOMY     POLYPECTOMY  2017   TA and benign polypoid   POLYPECTOMY  06/01/2021   Procedure: POLYPECTOMY;  Surgeon: Albertus Gordy HERO, MD;  Location: [facility];  Service: Gastroenterology;;   WISDOM TOOTH EXTRACTION     Patient Active Problem List  Diagnosis Date Noted   Genetic testing 06/23/2023   Malignant neoplasm of upper-outer quadrant of left breast in female, estrogen receptor positive (HCC) 06/11/2023   Gastritis without bleeding    Esophageal dysphagia    Schatzki's ring    Benign neoplasm of ascending colon    Class 3 severe obesity with serious comorbidity and body mass index (BMI) of 40.0 to 44.9 in adult 08/25/2019   Type 2 diabetes mellitus without complication, without long-term current use of insulin (HCC) 08/25/2019   Chronic cough 06/27/2017   LUQ abdominal pain 12/07/2014   RUQ abdominal pain 11/10/2013   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   Gastroesophageal reflux  disease 12/18/2012   Hx of adenomatous colonic polyps 12/18/2012   Chronic RLQ pain 12/18/2012   IBS (irritable bowel syndrome) 01/22/2011   HTN (hypertension) 01/22/2011   Hyperlipidemia 01/22/2011   Anxiety and depression 01/22/2011    PCP: Haze Servant, NP  REFERRING PROVIDER: Dr. Jina Nephew   REFERRING DIAG: Left breast cancer  THERAPY DIAG:  Lymphedema, not elsewhere classified  Stiffness of left shoulder, not elsewhere classified  Aftercare following surgery for neoplasm  Abnormal posture  Malignant neoplasm of upper-outer quadrant of left breast in female, estrogen receptor positive (HCC)  Rationale for Evaluation and Treatment: Rehabilitation  ONSET DATE: 04/26/23  SUBJECTIVE:                                                                                                                                                                                           SUBJECTIVE STATEMENT: She really found my cording last time! I was sore when I did my stretches yesterday and tender to touch. But it's feeling better today. My breast had really hurt over the weekend. It felt so full I had to take of my compression bra. I can still feel the hard area at my areolar.   PERTINENT HISTORY:  Patient was diagnosed on 04/26/2023 with left grade 3 invasive ductal carcinoma breast cancer. It measures 1 cm and is located in the upper outer quadrant. It is ER/PR positive and HER2 negative with a Ki67 of 30%. Pt underwent a L lumpectomy and SLNB 0/2 on 07/02/23  PATIENT GOALS:  Reassess how my recovery is going related to arm function, pain, and swelling.  PAIN:  Are you having pain? No not currently, just still ttp at area of cording  PRECAUTIONS: Recent Surgery, left UE Lymphedema risk,   RED FLAGS: None   ACTIVITY LEVEL / LEISURE: doing post op exercises   OBJECTIVE:   PATIENT SURVEYS:  QUICK DASH:     OBSERVATIONS: Healing scars with some increased scar tissue  at L  axilla  POSTURE:  Forward head and rounded shoulders posture   UPPER EXTREMITY AROM/PROM:   A/PROM RIGHT   eval    Shoulder extension 48  Shoulder flexion 153  Shoulder abduction 165  Shoulder internal rotation 63  Shoulder external rotation 84                          (Blank rows = not tested)   A/PROM LEFT   eval LEFT  07/25/23 LEFT 08/08/23 LEFT 10/01/23  Shoulder extension 35 70    Shoulder flexion 150 162 171 176  Shoulder abduction 167 156 151 163  Shoulder internal rotation 63 69    Shoulder external rotation 75 80                            (Blank rows = not tested)   CERVICAL AROM: All within normal limits   UPPER EXTREMITY STRENGTH: WFL   LYMPHEDEMA ASSESSMENTS (in cm):    LANDMARK RIGHT   eval  10 cm proximal to olecranon process 38.8  Olecranon process 28.7  10 cm proximal to ulnar styloid process 25.8  Just proximal to ulnar styloid process 17  Across hand at thumb web space 20.5  At base of 2nd digit 5.9  (Blank rows = not tested)   LANDMARK LEFT   eval LEFT 07/25/23  10 cm proximal to olecranon process 40 38.4  Olecranon process 28.8 29.5  10 cm proximal to ulnar styloid process 24.3 23  Just proximal to ulnar styloid process 16.3 16.5  Across hand at thumb web space 19.5 20  At base of 2nd digit 5.9 5.8  (Blank rows = not tested)  Surgery type/Date: 07/02/23 L breast lumpectomy and SLNB Number of lymph nodes removed: 0/2 Current/past treatment (chemo, radiation, hormone therapy): does not need chemo, will require radiation, will need hormone therapy Other symptoms:  Heaviness/tightness Yes Pain Yes Pitting edema No Infections No Decreased scar mobility Yes Stemmer sign No     TREATMENT PERFORMED: 10/31/23: Manual Therapy: In supine: Short neck, superficial and deep abdominals, Rt axillary and pectoral nodes, anterior intact thorax sequence, establishment of anterior inter-axillary pathway, Lt inguinal nodes and establishment of Lt  axillo-inguinal pathway, then L breast moving fluid towards pathways spending extra time in any areas of fibrosis, then into Rt S/L for focus to fibrosis in lateral breast and found what felt like edges of a seroma near areolar, then finished retracing all steps in supine. Pt reports area that felt like seroma was where she had increased pain over the weekend to the point she took her compression bra off because she like her breast was trying to stretch out of it.  MFR to area of cording found at last session but hard to palpate cords today, also focused on area at Lt lateral trunk that pt reports felt like a cord over past few days and would feel better when she pushed on it. Found area of tightness that may or may not have been cord, but definitely tight regardless P/ROM to Lt shoulder into flex, abd and D2 with scap depression during for varying stretches for cording tightness  10/29/23: Manual Therapy: In supine: Short neck, 5 diaphragmatic breaths, R axillary nodes and establishment of interaxillary pathway, L inguinal nodes and establishment of axilloinguinal pathway, then L breast moving fluid towards pathways spending extra time in any areas of fibrosis then retracing all  steps. MFR to numerous cords in L axilla extending down to upper arm with L UE in 100 degrees of abduction 10/24/23: Manual Therapy: Instructed pt in each step of MLD and had her return demonstrate each step with verbal and tactile cues for speed, pressure, direction of stretch and skin stretch technique. Pt completed first half with cueing and therapist completed second half. MLD to Lt breast and upper arm in supine: Short neck, superficial and deep abdominals, Rt axillary and pectoral nodes and establishment of anterior inter-axillary pathway, Lt inguinal nodes and establishment of axillo-inguinal pathway, then Lt breast moving fluid towards pathways and L axilla spending extra time in any areas of fibrosis then LUE working  proximal to distal and retracing all steps   10/21/23: Manual Therapy: MLD to Lt breast and upper arm in supine: Short neck, superficial and deep abdominals, Rt axillary and pectoral nodes and establishment of anterior inter-axillary pathway, Lt inguinal nodes and establishment of axillo-inguinal pathway, then Lt breast moving fluid towards pathways and L axilla spending extra time in any areas of fibrosis then LUE working proximal to distal and retracing all steps  STM when in Rt sidelying to Lt serratus and lateral trunk, also during P/ROM to same P/ROM to Lt shoulder into flex, abd and D2 with scapular depression by therapist throughout  10/17/23: Manual Therapy: MLD to Lt breast and upper arm in supine: Short neck, superficial and deep abdominals, Rt axillary and pectoral nodes and establishment of anterior inter-axillary pathway, Lt inguinal nodes and establishment of axillo-inguinal pathway, then Lt breast moving fluid towards pathways and L axilla spending extra time in any areas of fibrosis then LUE working proximal to distal and retracing all steps  STM when in Rt sidelying to Lt serratus and lateral trunk Issued instructions for UE MLD  10/15/23: Manual Therapy: MLD to Lt breast and upper arm in supine: Short neck, superficial and deep abdominals, Rt axillary and pectoral nodes and establishment of anterior inter-axillary pathway, Lt inguinal nodes and establishment of axillo-inguinal pathway, then Lt breast moving fluid towards pathways and L axilla spending extra time in any areas of fibrosis then LUE working proximal to distal and retracing all steps  STM when in Rt sidelying to Lt serratus Educated pt to wear her compression sleeve    The patient was assessed using the L-Dex machine today to produce a lymphedema index baseline score. The patient will be reassessed on a regular basis (typically every 3 months) to obtain new L-Dex scores. If the score is > 6.5 points away from his/her  baseline score indicating onset of subclinical lymphedema, it will be recommended to wear a compression garment for 4 weeks, 12 hours per day and then be reassessed. If the score continues to be > 6.5 points from baseline at reassessment, we will initiate lymphedema treatment. Assessing in this manner has a 95% rate of preventing clinically significant lymphedema.        PATIENT EDUCATION:  Wear compression bra 12 hrs/day for 4 weeks during day time hours Access Code: [email or web address] URL: [email or web address] Date: 08/06/2023 Prepared by: Grayce Sheldon  Exercises - Single Arm Doorway Pec Stretch at 90 Degrees Abduction  - 2 x daily - 7 x weekly - 1 sets - 3 reps - 20-30 hold - Supine Lower Trunk Rotation  - 1 x daily - 7 x weekly - 1 sets - 3 reps - 20-30 hold Education details: nerve desensitization, supine dowel exercises, lymphedema risk reduction and importance of skin care Person educated:  Patient Education method: Explanation, Demonstration, Tactile cues, and Handouts Education comprehension: verbalized understanding and returned demonstration  HOME EXERCISE PROGRAM: Reviewed previously given post op HEP. Supine dowel exercises in to abduction and flexion  ASSESSMENT:  CLINICAL IMPRESSION: Today continued with focus on stretching area of cording found at last session. This therapist was not able to find specific cording today, however there was palpable tightness in same area so continued with MFR to release tightness. Also may have felt edges of what appeared to be a seroma and pt reps Blaire, PT she saw Monday said same so messaged Dr. Aron about this change.   Pt will benefit from skilled therapeutic intervention to improve on the following deficits: Decreased knowledge of precautions, impaired UE functional use, pain, decreased ROM, postural dysfunction, increased edema.   PT treatment/interventions: ADL/Self care home management, [phone]- PT Re-evaluation,  97110-Therapeutic exercises, 97530- Therapeutic activity, [id number]- Neuromuscular re-education, 97535- Self Care, 02859- Manual therapy, [id number]- Orthotic Initial, and [id number]- Orthotic/Prosthetic subsequent   GOALS: Goals reviewed with patient? Yes  LONG TERM GOALS:  (STG=LTG)  GOALS Name Target Date  Goal status  1 Pt will demonstrate she has regained full shoulder ROM and function post operatively compared to baselines.  Baseline: 08/22/23 MET  2 Pt will report no pain at end range of L shoulder abduction to allow improved function. 08/22/23 MET  3 Pt will be independent in a home exercise program for continued stretching and strengthening.  08/22/23 MET  4 Pt will be independent in self MLD for long term management of L breast lymphedema and swelling in L axilla to decrease risk of infection. 10/29/23 MET 10/29/23  5 Pt will report a 50% improvement in pain in L breast to allow improved comfort.  11/26/23 NEW  6 Pt will return to the green on the SOZO demonstrating reversal of subclinical lymphedema.  11/26/23 NEW  7 Pt will demonstrate a 75% improvement in pain and discomfort of L axilla from cording to allow improved comfort and function 11/26/23 NEW     PLAN:  PT FREQUENCY/DURATION: 2x/wk for 4 wks  PLAN FOR NEXT SESSION: MFR to cording in L axilla,  Continue MLD to LUE and L breast to reduce pain  [facility]  [address], [address]  [place] [address]  [phone]  Self manual lymph drainage: Do circles behind the collar bones x 10  Perform this sequence once a day.  Only give enough pressure no your skin to make the skin move.  Diaphragmatic - Supine   Inhale through nose making navel move out toward hands. Exhale through puckered lips, hands follow navel in. Repeat _5__ times. Rest _10__ seconds between repeats.   Copyright  VHI. All rights reserved.  Hug yourself.  Do circles at your neck just above your collarbones.  Repeat this 10 times.  Axilla  - One at a Time   Using full weight of flat hand and fingers at center of uninvolved (R) armpit, make _10__ in-place circles.   Copyright  VHI. All rights reserved.  LEG: Inguinal Nodes Stimulation   With small finger side of hand against hip crease on involved (L) side, gently perform circles at the crease. Repeat __10_ times.   Copyright  VHI. All rights reserved.  Axilla to Inguinal Nodes - Sweep   On involved side, stretch skin from armpit along side of trunk to hip crease.  Now gently stretch skin from the involved side to the uninvolved side across the chest at  the shoulder line.    Move fluid from L armpit in area that feels swollen towards these 2 pathways by gently stretching skin.   Finish by doing circles in R armpit and L groin.   Finish by doing the pathways as described above going from your involved armpit to the same side groin and going across your upper chest from the involved shoulder to the uninvolved shoulder.  Repeat the steps above where you do circles in your left groin and right armpit. Copyright  VHI. All rights reserved.    Aden Berwyn Caldron, PTA 10/31/2023, 10:10 AM

## 2023-11-01 ENCOUNTER — Encounter: Attending: Nurse Practitioner | Admitting: Dietician

## 2023-11-01 DIAGNOSIS — E119 Type 2 diabetes mellitus without complications: Secondary | ICD-10-CM | POA: Insufficient documentation

## 2023-11-01 DIAGNOSIS — I10 Essential (primary) hypertension: Secondary | ICD-10-CM | POA: Diagnosis present

## 2023-11-01 DIAGNOSIS — Z7984 Long term (current) use of oral hypoglycemic drugs: Secondary | ICD-10-CM | POA: Insufficient documentation

## 2023-11-01 NOTE — Patient Instructions (Signed)
 Aim for balanced meals using the plate planner  1/2 plate of non starchy vegetables, 1/4 protein, 1/4 carbohydrate

## 2023-11-05 ENCOUNTER — Ambulatory Visit: Payer: Self-pay | Admitting: Physical Therapy

## 2023-11-05 ENCOUNTER — Encounter: Payer: Self-pay | Admitting: Physical Therapy

## 2023-11-05 DIAGNOSIS — I890 Lymphedema, not elsewhere classified: Secondary | ICD-10-CM

## 2023-11-05 DIAGNOSIS — Z170 Estrogen receptor positive status [ER+]: Secondary | ICD-10-CM

## 2023-11-05 DIAGNOSIS — Z483 Aftercare following surgery for neoplasm: Secondary | ICD-10-CM

## 2023-11-05 DIAGNOSIS — R293 Abnormal posture: Secondary | ICD-10-CM

## 2023-11-05 DIAGNOSIS — M25612 Stiffness of left shoulder, not elsewhere classified: Secondary | ICD-10-CM

## 2023-11-05 NOTE — Therapy (Signed)
 OUTPATIENT PHYSICAL THERAPY BREAST CANCER TREATMENT   Patient Name: Norma [name] MRN: [id number] DOB:1970/03/20, 53 y.o., female Today's Date: 11/05/2023  END OF SESSION:  PT End of Session - 11/05/23 1110     Visit Number 17    Number of Visits 23    Date for PT Re-Evaluation 11/26/23    PT Start Time 1104    PT Stop Time 1155    PT Time Calculation (min) 51 min    Activity Tolerance Patient tolerated treatment well    Behavior During Therapy WFL for tasks assessed/performed            Past Medical History:  Diagnosis Date   Allergy    Anemia    hx of   Anxiety    Arthritis    KNEES   Asthma    treated for during COVID tx   Cancer (HCC) 06/2023   left breast IDC   Chronic headache    Colon polyps    TUBULAR ADENOMAS AND HYPERPLASTIC    Complication of anesthesia    DDD (degenerative disc disease), thoracic    Depression    Diabetes mellitus, type 2 (HCC)    Difficult airway for intubation    Fibromyalgia    GERD (gastroesophageal reflux disease)    Goiter    Hyperlipidemia    Hypertension    Low back pain    Obesity    PONV (postoperative nausea and vomiting)    RA (rheumatoid arthritis) (HCC)    Seasonal allergies    Vitamin D  deficiency    Past Surgical History:  Procedure Laterality Date   ABDOMINAL HYSTERECTOMY  2008   BACK SURGERY  2005   lumbar laminectomy   BALLOON DILATION N/A 06/01/2021   Procedure: BALLOON DILATION;  Surgeon: Albertus Gordy HERO, MD;  Location: [facility];  Service: Gastroenterology;  Laterality: N/A;   BARTHOLIN GLAND CYST EXCISION     BIOPSY  06/01/2021   Procedure: BIOPSY;  Surgeon: Albertus Gordy HERO, MD;  Location: [facility];  Service: Gastroenterology;;   BREAST LUMPECTOMY WITH RADIOACTIVE SEED AND SENTINEL LYMPH NODE BIOPSY Left 07/02/2023   Procedure: BREAST LUMPECTOMY WITH RADIOACTIVE SEED AND SENTINEL LYMPH NODE BIOPSY;  Surgeon: Aron Shoulders, MD;  Location: [facility];  Service: General;   Laterality: Left;  90 MIN 78444- EXCISION LEFT CHEST WALL MASS GEN COMBINED WITH REGIONAL   COLONOSCOPY  2017   JMP-MAC-prep good-TA -recall 5 yr   COLONOSCOPY WITH PROPOFOL  N/A 06/01/2021   Procedure: COLONOSCOPY WITH PROPOFOL ;  Surgeon: Albertus Gordy HERO, MD;  Location: [facility];  Service: Gastroenterology;  Laterality: N/A;   ESOPHAGOGASTRODUODENOSCOPY (EGD) WITH PROPOFOL  N/A 06/01/2021   Procedure: ESOPHAGOGASTRODUODENOSCOPY (EGD) WITH PROPOFOL ;  Surgeon: Albertus Gordy HERO, MD;  Location: [facility];  Service: Gastroenterology;  Laterality: N/A;   LUMBAR LAMINECTOMY/DECOMPRESSION MICRODISCECTOMY Right 07/17/2013   Procedure: LUMBAR LAMINECTOMY/DECOMPRESSION MICRODISCECTOMY 1 LEVEL,RIGHT LUMBAR FOUR-FIVE;  Surgeon: Darina MALVA Boehringer, MD;  Location: [facility];  Service: Neurosurgery;  Laterality: Right;  Right   MASS EXCISION Left 07/02/2023   Procedure: EXCISION, MASS, CHEST WALL;  Surgeon: Aron Shoulders, MD;  Location: [facility];  Service: General;  Laterality: Left;   PARTIAL HYSTERECTOMY     POLYPECTOMY  2017   TA and benign polypoid   POLYPECTOMY  06/01/2021   Procedure: POLYPECTOMY;  Surgeon: Albertus Gordy HERO, MD;  Location: [facility];  Service: Gastroenterology;;   WISDOM TOOTH EXTRACTION     Patient Active Problem List  Diagnosis Date Noted   Genetic testing 06/23/2023   Malignant neoplasm of upper-outer quadrant of left breast in female, estrogen receptor positive (HCC) 06/11/2023   Gastritis without bleeding    Esophageal dysphagia    Schatzki's ring    Benign neoplasm of ascending colon    Class 3 severe obesity with serious comorbidity and body mass index (BMI) of 40.0 to 44.9 in adult 08/25/2019   Type 2 diabetes mellitus without complication, without long-term current use of insulin (HCC) 08/25/2019   Chronic cough 06/27/2017   LUQ abdominal pain 12/07/2014   RUQ abdominal pain 11/10/2013   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   Gastroesophageal reflux  disease 12/18/2012   Hx of adenomatous colonic polyps 12/18/2012   Chronic RLQ pain 12/18/2012   IBS (irritable bowel syndrome) 01/22/2011   HTN (hypertension) 01/22/2011   Hyperlipidemia 01/22/2011   Anxiety and depression 01/22/2011    PCP: Haze Servant, NP  REFERRING PROVIDER: Dr. Jina Nephew   REFERRING DIAG: Left breast cancer  THERAPY DIAG:  Lymphedema, not elsewhere classified  Stiffness of left shoulder, not elsewhere classified  Aftercare following surgery for neoplasm  Abnormal posture  Malignant neoplasm of upper-outer quadrant of left breast in female, estrogen receptor positive (HCC)  Rationale for Evaluation and Treatment: Rehabilitation  ONSET DATE: 04/26/23  SUBJECTIVE:                                                                                                                                                                                           SUBJECTIVE STATEMENT: I see the doctor this afternoon about my breast pain. The pain is not as bad as it was before the weekend but it is still painful.   PERTINENT HISTORY:  Patient was diagnosed on 04/26/2023 with left grade 3 invasive ductal carcinoma breast cancer. It measures 1 cm and is located in the upper outer quadrant. It is ER/PR positive and HER2 negative with a Ki67 of 30%. Pt underwent a L lumpectomy and SLNB 0/2 on 07/02/23  PATIENT GOALS:  Reassess how my recovery is going related to arm function, pain, and swelling.  PAIN:  Are you having pain? No not currently  PRECAUTIONS: Recent Surgery, left UE Lymphedema risk,   RED FLAGS: None   ACTIVITY LEVEL / LEISURE: doing post op exercises   OBJECTIVE:   PATIENT SURVEYS:  QUICK DASH:     OBSERVATIONS: Healing scars with some increased scar tissue at L axilla  POSTURE:  Forward head and rounded shoulders posture   UPPER EXTREMITY AROM/PROM:   A/PROM RIGHT   eval    Shoulder extension 48  Shoulder flexion 153  Shoulder  abduction 165  Shoulder internal rotation 63  Shoulder external rotation 84                          (Blank rows = not tested)   A/PROM LEFT   eval LEFT  07/25/23 LEFT 08/08/23 LEFT 10/01/23  Shoulder extension 35 70    Shoulder flexion 150 162 171 176  Shoulder abduction 167 156 151 163  Shoulder internal rotation 63 69    Shoulder external rotation 75 80                            (Blank rows = not tested)   CERVICAL AROM: All within normal limits   UPPER EXTREMITY STRENGTH: WFL   LYMPHEDEMA ASSESSMENTS (in cm):    LANDMARK RIGHT   eval  10 cm proximal to olecranon process 38.8  Olecranon process 28.7  10 cm proximal to ulnar styloid process 25.8  Just proximal to ulnar styloid process 17  Across hand at thumb web space 20.5  At base of 2nd digit 5.9  (Blank rows = not tested)   LANDMARK LEFT   eval LEFT 07/25/23  10 cm proximal to olecranon process 40 38.4  Olecranon process 28.8 29.5  10 cm proximal to ulnar styloid process 24.3 23  Just proximal to ulnar styloid process 16.3 16.5  Across hand at thumb web space 19.5 20  At base of 2nd digit 5.9 5.8  (Blank rows = not tested)  Surgery type/Date: 07/02/23 L breast lumpectomy and SLNB Number of lymph nodes removed: 0/2 Current/past treatment (chemo, radiation, hormone therapy): does not need chemo, will require radiation, will need hormone therapy Other symptoms:  Heaviness/tightness Yes Pain Yes Pitting edema No Infections No Decreased scar mobility Yes Stemmer sign No     TREATMENT PERFORMED: 11/05/23: Manual Therapy: In supine: Short neck, 5 diaphragmatic breaths, R axillary nodes and establishment of interaxillary pathway, L inguinal nodes and establishment of axilloinguinal pathway, then L breast moving fluid towards pathways spending extra time in any areas of fibrosis then retracing all steps then L UE working proximal to distal with increased time spent at axilla and upper arm then retracing all  steps.  MFR to area of cording with UE in to abduction with cording more difficult to palpate but definite areas of tightness  10/31/23: Manual Therapy: In supine: Short neck, superficial and deep abdominals, Rt axillary and pectoral nodes, anterior intact thorax sequence, establishment of anterior inter-axillary pathway, Lt inguinal nodes and establishment of Lt axillo-inguinal pathway, then L breast moving fluid towards pathways spending extra time in any areas of fibrosis, then into Rt S/L for focus to fibrosis in lateral breast and found what felt like edges of a seroma near areolar, then finished retracing all steps in supine. Pt reports area that felt like seroma was where she had increased pain over the weekend to the point she took her compression bra off because she like her breast was trying to stretch out of it.  MFR to area of cording found at last session but hard to palpate cords today, also focused on area at Lt lateral trunk that pt reports felt like a cord over past few days and would feel better when she pushed on it. Found area of tightness that may or may not have been cord, but definitely tight regardless P/ROM to Lt shoulder into flex, abd and D2 with scap  depression during for varying stretches for cording tightness  10/29/23: Manual Therapy: In supine: Short neck, 5 diaphragmatic breaths, R axillary nodes and establishment of interaxillary pathway, L inguinal nodes and establishment of axilloinguinal pathway, then L breast moving fluid towards pathways spending extra time in any areas of fibrosis then retracing all steps. MFR to numerous cords in L axilla extending down to upper arm with L UE in 100 degrees of abduction 10/24/23: Manual Therapy: Instructed pt in each step of MLD and had her return demonstrate each step with verbal and tactile cues for speed, pressure, direction of stretch and skin stretch technique. Pt completed first half with cueing and therapist completed  second half. MLD to Lt breast and upper arm in supine: Short neck, superficial and deep abdominals, Rt axillary and pectoral nodes and establishment of anterior inter-axillary pathway, Lt inguinal nodes and establishment of axillo-inguinal pathway, then Lt breast moving fluid towards pathways and L axilla spending extra time in any areas of fibrosis then LUE working proximal to distal and retracing all steps   10/21/23: Manual Therapy: MLD to Lt breast and upper arm in supine: Short neck, superficial and deep abdominals, Rt axillary and pectoral nodes and establishment of anterior inter-axillary pathway, Lt inguinal nodes and establishment of axillo-inguinal pathway, then Lt breast moving fluid towards pathways and L axilla spending extra time in any areas of fibrosis then LUE working proximal to distal and retracing all steps  STM when in Rt sidelying to Lt serratus and lateral trunk, also during P/ROM to same P/ROM to Lt shoulder into flex, abd and D2 with scapular depression by therapist throughout  10/17/23: Manual Therapy: MLD to Lt breast and upper arm in supine: Short neck, superficial and deep abdominals, Rt axillary and pectoral nodes and establishment of anterior inter-axillary pathway, Lt inguinal nodes and establishment of axillo-inguinal pathway, then Lt breast moving fluid towards pathways and L axilla spending extra time in any areas of fibrosis then LUE working proximal to distal and retracing all steps  STM when in Rt sidelying to Lt serratus and lateral trunk Issued instructions for UE MLD  10/15/23: Manual Therapy: MLD to Lt breast and upper arm in supine: Short neck, superficial and deep abdominals, Rt axillary and pectoral nodes and establishment of anterior inter-axillary pathway, Lt inguinal nodes and establishment of axillo-inguinal pathway, then Lt breast moving fluid towards pathways and L axilla spending extra time in any areas of fibrosis then LUE working proximal to distal and  retracing all steps  STM when in Rt sidelying to Lt serratus Educated pt to wear her compression sleeve    The patient was assessed using the L-Dex machine today to produce a lymphedema index baseline score. The patient will be reassessed on a regular basis (typically every 3 months) to obtain new L-Dex scores. If the score is > 6.5 points away from his/her baseline score indicating onset of subclinical lymphedema, it will be recommended to wear a compression garment for 4 weeks, 12 hours per day and then be reassessed. If the score continues to be > 6.5 points from baseline at reassessment, we will initiate lymphedema treatment. Assessing in this manner has a 95% rate of preventing clinically significant lymphedema.        PATIENT EDUCATION:  Wear compression bra 12 hrs/day for 4 weeks during day time hours Access Code: [email or web address] URL: [email or web address] Date: 08/06/2023 Prepared by: Grayce Sheldon  Exercises - Single Arm Doorway Pec Stretch at 90 Degrees Abduction  - 2 x daily -  7 x weekly - 1 sets - 3 reps - 20-30 hold - Supine Lower Trunk Rotation  - 1 x daily - 7 x weekly - 1 sets - 3 reps - 20-30 hold Education details: nerve desensitization, supine dowel exercises, lymphedema risk reduction and importance of skin care Person educated: Patient Education method: Explanation, Demonstration, Tactile cues, and Handouts Education comprehension: verbalized understanding and returned demonstration  HOME EXERCISE PROGRAM: Reviewed previously given post op HEP. Supine dowel exercises in to abduction and flexion  ASSESSMENT:  CLINICAL IMPRESSION: Pt reports the pain has been a little better since the weekend. She has been getting more sensation in her lateral trunk back and had a shooting pain in this area near the end of her session today. Continued with MLD to breast and UE and myofascial release to cording with cording less palpable today. Pt sees the doctor this  afternoon regarding the pain she has been having in her breast.   Pt will benefit from skilled therapeutic intervention to improve on the following deficits: Decreased knowledge of precautions, impaired UE functional use, pain, decreased ROM, postural dysfunction, increased edema.   PT treatment/interventions: ADL/Self care home management, [phone]- PT Re-evaluation, 97110-Therapeutic exercises, 97530- Therapeutic activity, [id number]- Neuromuscular re-education, 97535- Self Care, 02859- Manual therapy, [id number]- Orthotic Initial, and [id number]- Orthotic/Prosthetic subsequent   GOALS: Goals reviewed with patient? Yes  LONG TERM GOALS:  (STG=LTG)  GOALS Name Target Date  Goal status  1 Pt will demonstrate she has regained full shoulder ROM and function post operatively compared to baselines.  Baseline: 08/22/23 MET  2 Pt will report no pain at end range of L shoulder abduction to allow improved function. 08/22/23 MET  3 Pt will be independent in a home exercise program for continued stretching and strengthening.  08/22/23 MET  4 Pt will be independent in self MLD for long term management of L breast lymphedema and swelling in L axilla to decrease risk of infection. 10/29/23 MET 10/29/23  5 Pt will report a 50% improvement in pain in L breast to allow improved comfort.  11/26/23 NEW  6 Pt will return to the green on the SOZO demonstrating reversal of subclinical lymphedema.  11/26/23 NEW  7 Pt will demonstrate a 75% improvement in pain and discomfort of L axilla from cording to allow improved comfort and function 11/26/23 NEW     PLAN:  PT FREQUENCY/DURATION: 2x/wk for 4 wks  PLAN FOR NEXT SESSION: MFR to cording in L axilla,  Continue MLD to LUE and L breast to reduce pain, what did dr say?  [facility]  [address], [address]  [place] [address]  [phone]  Self manual lymph drainage: Do circles behind the collar bones x 10  Perform this sequence once a day.  Only give  enough pressure no your skin to make the skin move.  Diaphragmatic - Supine   Inhale through nose making navel move out toward hands. Exhale through puckered lips, hands follow navel in. Repeat _5__ times. Rest _10__ seconds between repeats.   Copyright  VHI. All rights reserved.  Hug yourself.  Do circles at your neck just above your collarbones.  Repeat this 10 times.  Axilla - One at a Time   Using full weight of flat hand and fingers at center of uninvolved (R) armpit, make _10__ in-place circles.   Copyright  VHI. All rights reserved.  LEG: Inguinal Nodes Stimulation   With small finger side of hand against hip crease on  involved (L) side, gently perform circles at the crease. Repeat __10_ times.   Copyright  VHI. All rights reserved.  Axilla to Inguinal Nodes - Sweep   On involved side, stretch skin from armpit along side of trunk to hip crease.  Now gently stretch skin from the involved side to the uninvolved side across the chest at the shoulder line.    Move fluid from L armpit in area that feels swollen towards these 2 pathways by gently stretching skin.   Finish by doing circles in R armpit and L groin.   Finish by doing the pathways as described above going from your involved armpit to the same side groin and going across your upper chest from the involved shoulder to the uninvolved shoulder.  Repeat the steps above where you do circles in your left groin and right armpit. Copyright  VHI. All rights reserved.    [facility] Wilcox, PT 11/05/2023, 12:00 PM

## 2023-11-07 ENCOUNTER — Ambulatory Visit: Payer: Self-pay | Admitting: Physical Therapy

## 2023-11-07 ENCOUNTER — Encounter: Payer: Self-pay | Admitting: Physical Therapy

## 2023-11-07 DIAGNOSIS — Z483 Aftercare following surgery for neoplasm: Secondary | ICD-10-CM

## 2023-11-07 DIAGNOSIS — C50412 Malignant neoplasm of upper-outer quadrant of left female breast: Secondary | ICD-10-CM

## 2023-11-07 DIAGNOSIS — I890 Lymphedema, not elsewhere classified: Secondary | ICD-10-CM

## 2023-11-07 DIAGNOSIS — M25612 Stiffness of left shoulder, not elsewhere classified: Secondary | ICD-10-CM

## 2023-11-07 DIAGNOSIS — R293 Abnormal posture: Secondary | ICD-10-CM

## 2023-11-07 NOTE — Therapy (Signed)
 OUTPATIENT PHYSICAL THERAPY BREAST CANCER TREATMENT   Patient Name: Norma [name] MRN: [id number] DOB:1970/12/06, 53 y.o., female Today's Date: 11/07/2023  END OF SESSION:  PT End of Session - 11/07/23 1108     Visit Number 18    Number of Visits 23    Date for PT Re-Evaluation 11/26/23    PT Start Time 1106    PT Stop Time 1157    PT Time Calculation (min) 51 min    Activity Tolerance Patient tolerated treatment well    Behavior During Therapy WFL for tasks assessed/performed            Past Medical History:  Diagnosis Date   Allergy    Anemia    hx of   Anxiety    Arthritis    KNEES   Asthma    treated for during COVID tx   Cancer (HCC) 06/2023   left breast IDC   Chronic headache    Colon polyps    TUBULAR ADENOMAS AND HYPERPLASTIC    Complication of anesthesia    DDD (degenerative disc disease), thoracic    Depression    Diabetes mellitus, type 2 (HCC)    Difficult airway for intubation    Fibromyalgia    GERD (gastroesophageal reflux disease)    Goiter    Hyperlipidemia    Hypertension    Low back pain    Obesity    PONV (postoperative nausea and vomiting)    RA (rheumatoid arthritis) (HCC)    Seasonal allergies    Vitamin D  deficiency    Past Surgical History:  Procedure Laterality Date   ABDOMINAL HYSTERECTOMY  2008   BACK SURGERY  2005   lumbar laminectomy   BALLOON DILATION N/A 06/01/2021   Procedure: BALLOON DILATION;  Surgeon: Albertus Gordy HERO, MD;  Location: [facility];  Service: Gastroenterology;  Laterality: N/A;   BARTHOLIN GLAND CYST EXCISION     BIOPSY  06/01/2021   Procedure: BIOPSY;  Surgeon: Albertus Gordy HERO, MD;  Location: [facility];  Service: Gastroenterology;;   BREAST LUMPECTOMY WITH RADIOACTIVE SEED AND SENTINEL LYMPH NODE BIOPSY Left 07/02/2023   Procedure: BREAST LUMPECTOMY WITH RADIOACTIVE SEED AND SENTINEL LYMPH NODE BIOPSY;  Surgeon: Aron Shoulders, MD;  Location: [facility];  Service: General;   Laterality: Left;  90 MIN 78444- EXCISION LEFT CHEST WALL MASS GEN COMBINED WITH REGIONAL   COLONOSCOPY  2017   JMP-MAC-prep good-TA -recall 5 yr   COLONOSCOPY WITH PROPOFOL  N/A 06/01/2021   Procedure: COLONOSCOPY WITH PROPOFOL ;  Surgeon: Albertus Gordy HERO, MD;  Location: [facility];  Service: Gastroenterology;  Laterality: N/A;   ESOPHAGOGASTRODUODENOSCOPY (EGD) WITH PROPOFOL  N/A 06/01/2021   Procedure: ESOPHAGOGASTRODUODENOSCOPY (EGD) WITH PROPOFOL ;  Surgeon: Albertus Gordy HERO, MD;  Location: [facility];  Service: Gastroenterology;  Laterality: N/A;   LUMBAR LAMINECTOMY/DECOMPRESSION MICRODISCECTOMY Right 07/17/2013   Procedure: LUMBAR LAMINECTOMY/DECOMPRESSION MICRODISCECTOMY 1 LEVEL,RIGHT LUMBAR FOUR-FIVE;  Surgeon: Darina MALVA Boehringer, MD;  Location: [facility];  Service: Neurosurgery;  Laterality: Right;  Right   MASS EXCISION Left 07/02/2023   Procedure: EXCISION, MASS, CHEST WALL;  Surgeon: Aron Shoulders, MD;  Location: [facility];  Service: General;  Laterality: Left;   PARTIAL HYSTERECTOMY     POLYPECTOMY  2017   TA and benign polypoid   POLYPECTOMY  06/01/2021   Procedure: POLYPECTOMY;  Surgeon: Albertus Gordy HERO, MD;  Location: [facility];  Service: Gastroenterology;;   WISDOM TOOTH EXTRACTION     Patient Active Problem List  Diagnosis Date Noted   Genetic testing 06/23/2023   Malignant neoplasm of upper-outer quadrant of left breast in female, estrogen receptor positive (HCC) 06/11/2023   Gastritis without bleeding    Esophageal dysphagia    Schatzki's ring    Benign neoplasm of ascending colon    Class 3 severe obesity with serious comorbidity and body mass index (BMI) of 40.0 to 44.9 in adult 08/25/2019   Type 2 diabetes mellitus without complication, without long-term current use of insulin (HCC) 08/25/2019   Chronic cough 06/27/2017   LUQ abdominal pain 12/07/2014   RUQ abdominal pain 11/10/2013   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   Gastroesophageal reflux  disease 12/18/2012   Hx of adenomatous colonic polyps 12/18/2012   Chronic RLQ pain 12/18/2012   IBS (irritable bowel syndrome) 01/22/2011   HTN (hypertension) 01/22/2011   Hyperlipidemia 01/22/2011   Anxiety and depression 01/22/2011    PCP: Haze Servant, NP  REFERRING PROVIDER: Dr. Jina Nephew   REFERRING DIAG: Left breast cancer  THERAPY DIAG:  Lymphedema, not elsewhere classified  Stiffness of left shoulder, not elsewhere classified  Aftercare following surgery for neoplasm  Abnormal posture  Malignant neoplasm of upper-outer quadrant of left breast in female, estrogen receptor positive (HCC)  Rationale for Evaluation and Treatment: Rehabilitation  ONSET DATE: 04/26/23  SUBJECTIVE:                                                                                                                                                                                           SUBJECTIVE STATEMENT: I saw the doctor and she said it was not infected. She said it was a small seroma and probably would not have enough fluid to be drained.   PERTINENT HISTORY:  Patient was diagnosed on 04/26/2023 with left grade 3 invasive ductal carcinoma breast cancer. It measures 1 cm and is located in the upper outer quadrant. It is ER/PR positive and HER2 negative with a Ki67 of 30%. Pt underwent a L lumpectomy and SLNB 0/2 on 07/02/23  PATIENT GOALS:  Reassess how my recovery is going related to arm function, pain, and swelling.  PAIN:  Are you having pain?  1-2/10 shooting pain in inferior L breast while in waiting room but none currently  PRECAUTIONS: Recent Surgery, left UE Lymphedema risk,   RED FLAGS: None   ACTIVITY LEVEL / LEISURE: doing post op exercises   OBJECTIVE:   PATIENT SURVEYS:  QUICK DASH:     OBSERVATIONS: Healing scars with some increased scar tissue at L axilla  POSTURE:  Forward head and rounded shoulders posture   UPPER EXTREMITY AROM/PROM:   A/PROM RIGHT  eval    Shoulder extension 48  Shoulder flexion 153  Shoulder abduction 165  Shoulder internal rotation 63  Shoulder external rotation 84                          (Blank rows = not tested)   A/PROM LEFT   eval LEFT  07/25/23 LEFT 08/08/23 LEFT 10/01/23  Shoulder extension 35 70    Shoulder flexion 150 162 171 176  Shoulder abduction 167 156 151 163  Shoulder internal rotation 63 69    Shoulder external rotation 75 80                            (Blank rows = not tested)   CERVICAL AROM: All within normal limits   UPPER EXTREMITY STRENGTH: WFL   LYMPHEDEMA ASSESSMENTS (in cm):    LANDMARK RIGHT   eval  10 cm proximal to olecranon process 38.8  Olecranon process 28.7  10 cm proximal to ulnar styloid process 25.8  Just proximal to ulnar styloid process 17  Across hand at thumb web space 20.5  At base of 2nd digit 5.9  (Blank rows = not tested)   LANDMARK LEFT   eval LEFT 07/25/23  10 cm proximal to olecranon process 40 38.4  Olecranon process 28.8 29.5  10 cm proximal to ulnar styloid process 24.3 23  Just proximal to ulnar styloid process 16.3 16.5  Across hand at thumb web space 19.5 20  At base of 2nd digit 5.9 5.8  (Blank rows = not tested)  Surgery type/Date: 07/02/23 L breast lumpectomy and SLNB Number of lymph nodes removed: 0/2 Current/past treatment (chemo, radiation, hormone therapy): does not need chemo, will require radiation, will need hormone therapy Other symptoms:  Heaviness/tightness Yes Pain Yes Pitting edema No Infections No Decreased scar mobility Yes Stemmer sign No     TREATMENT PERFORMED: 11/07/23: Manual Therapy: In supine: Short neck, 5 diaphragmatic breaths, R axillary nodes and establishment of interaxillary pathway, L inguinal nodes and establishment of axilloinguinal pathway, then L breast moving fluid towards pathways spending extra time in any areas of fibrosis then retracing all steps then L UE working proximal to distal with  increased time spent at axilla and upper arm then retracing all steps.  MFR to area of cording with UE in to abduction with cording more difficult to palpate but definite areas of tightness  11/05/23: Manual Therapy: In supine: Short neck, 5 diaphragmatic breaths, R axillary nodes and establishment of interaxillary pathway, L inguinal nodes and establishment of axilloinguinal pathway, then L breast moving fluid towards pathways spending extra time in any areas of fibrosis then retracing all steps then L UE working proximal to distal with increased time spent at axilla and upper arm then retracing all steps.  MFR to area of cording with UE in to abduction with cording more difficult to palpate but definite areas of tightness  10/31/23: Manual Therapy: In supine: Short neck, superficial and deep abdominals, Rt axillary and pectoral nodes, anterior intact thorax sequence, establishment of anterior inter-axillary pathway, Lt inguinal nodes and establishment of Lt axillo-inguinal pathway, then L breast moving fluid towards pathways spending extra time in any areas of fibrosis, then into Rt S/L for focus to fibrosis in lateral breast and found what felt like edges of a seroma near areolar, then finished retracing all steps in supine. Pt reports area that felt like seroma was where  she had increased pain over the weekend to the point she took her compression bra off because she like her breast was trying to stretch out of it.  MFR to area of cording found at last session but hard to palpate cords today, also focused on area at Lt lateral trunk that pt reports felt like a cord over past few days and would feel better when she pushed on it. Found area of tightness that may or may not have been cord, but definitely tight regardless P/ROM to Lt shoulder into flex, abd and D2 with scap depression during for varying stretches for cording tightness  10/29/23: Manual Therapy: In supine: Short neck, 5 diaphragmatic  breaths, R axillary nodes and establishment of interaxillary pathway, L inguinal nodes and establishment of axilloinguinal pathway, then L breast moving fluid towards pathways spending extra time in any areas of fibrosis then retracing all steps. MFR to numerous cords in L axilla extending down to upper arm with L UE in 100 degrees of abduction 10/24/23: Manual Therapy: Instructed pt in each step of MLD and had her return demonstrate each step with verbal and tactile cues for speed, pressure, direction of stretch and skin stretch technique. Pt completed first half with cueing and therapist completed second half. MLD to Lt breast and upper arm in supine: Short neck, superficial and deep abdominals, Rt axillary and pectoral nodes and establishment of anterior inter-axillary pathway, Lt inguinal nodes and establishment of axillo-inguinal pathway, then Lt breast moving fluid towards pathways and L axilla spending extra time in any areas of fibrosis then LUE working proximal to distal and retracing all steps   10/21/23: Manual Therapy: MLD to Lt breast and upper arm in supine: Short neck, superficial and deep abdominals, Rt axillary and pectoral nodes and establishment of anterior inter-axillary pathway, Lt inguinal nodes and establishment of axillo-inguinal pathway, then Lt breast moving fluid towards pathways and L axilla spending extra time in any areas of fibrosis then LUE working proximal to distal and retracing all steps  STM when in Rt sidelying to Lt serratus and lateral trunk, also during P/ROM to same P/ROM to Lt shoulder into flex, abd and D2 with scapular depression by therapist throughout  10/17/23: Manual Therapy: MLD to Lt breast and upper arm in supine: Short neck, superficial and deep abdominals, Rt axillary and pectoral nodes and establishment of anterior inter-axillary pathway, Lt inguinal nodes and establishment of axillo-inguinal pathway, then Lt breast moving fluid towards pathways and L  axilla spending extra time in any areas of fibrosis then LUE working proximal to distal and retracing all steps  STM when in Rt sidelying to Lt serratus and lateral trunk Issued instructions for UE MLD  10/15/23: Manual Therapy: MLD to Lt breast and upper arm in supine: Short neck, superficial and deep abdominals, Rt axillary and pectoral nodes and establishment of anterior inter-axillary pathway, Lt inguinal nodes and establishment of axillo-inguinal pathway, then Lt breast moving fluid towards pathways and L axilla spending extra time in any areas of fibrosis then LUE working proximal to distal and retracing all steps  STM when in Rt sidelying to Lt serratus Educated pt to wear her compression sleeve    The patient was assessed using the L-Dex machine today to produce a lymphedema index baseline score. The patient will be reassessed on a regular basis (typically every 3 months) to obtain new L-Dex scores. If the score is > 6.5 points away from his/her baseline score indicating onset of subclinical lymphedema, it will  be recommended to wear a compression garment for 4 weeks, 12 hours per day and then be reassessed. If the score continues to be > 6.5 points from baseline at reassessment, we will initiate lymphedema treatment. Assessing in this manner has a 95% rate of preventing clinically significant lymphedema.        PATIENT EDUCATION:  Wear compression bra 12 hrs/day for 4 weeks during day time hours Access Code: [email or web address] URL: [email or web address] Date: 08/06/2023 Prepared by: Grayce Sheldon  Exercises - Single Arm Doorway Pec Stretch at 90 Degrees Abduction  - 2 x daily - 7 x weekly - 1 sets - 3 reps - 20-30 hold - Supine Lower Trunk Rotation  - 1 x daily - 7 x weekly - 1 sets - 3 reps - 20-30 hold Education details: nerve desensitization, supine dowel exercises, lymphedema risk reduction and importance of skin care Person educated: Patient Education method:  Explanation, Demonstration, Tactile cues, and Handouts Education comprehension: verbalized understanding and returned demonstration  HOME EXERCISE PROGRAM: Reviewed previously given post op HEP. Supine dowel exercises in to abduction and flexion  ASSESSMENT:  CLINICAL IMPRESSION: Pt's pain continues to improve. She saw the doctor and there was no evidence of infection. Continued with MLD today and MFR to cording in L axilla. Cording is improving as well. Encouraged pt to continue pec stretching to decrease tightness.   Pt will benefit from skilled therapeutic intervention to improve on the following deficits: Decreased knowledge of precautions, impaired UE functional use, pain, decreased ROM, postural dysfunction, increased edema.   PT treatment/interventions: ADL/Self care home management, [phone]- PT Re-evaluation, 97110-Therapeutic exercises, 97530- Therapeutic activity, [id number]- Neuromuscular re-education, 97535- Self Care, 02859- Manual therapy, [id number]- Orthotic Initial, and [id number]- Orthotic/Prosthetic subsequent   GOALS: Goals reviewed with patient? Yes  LONG TERM GOALS:  (STG=LTG)  GOALS Name Target Date  Goal status  1 Pt will demonstrate she has regained full shoulder ROM and function post operatively compared to baselines.  Baseline: 08/22/23 MET  2 Pt will report no pain at end range of L shoulder abduction to allow improved function. 08/22/23 MET  3 Pt will be independent in a home exercise program for continued stretching and strengthening.  08/22/23 MET  4 Pt will be independent in self MLD for long term management of L breast lymphedema and swelling in L axilla to decrease risk of infection. 10/29/23 MET 10/29/23  5 Pt will report a 50% improvement in pain in L breast to allow improved comfort.  11/26/23 NEW  6 Pt will return to the green on the SOZO demonstrating reversal of subclinical lymphedema.  11/26/23 NEW  7 Pt will demonstrate a 75% improvement in pain and discomfort of L  axilla from cording to allow improved comfort and function 11/26/23 NEW     PLAN:  PT FREQUENCY/DURATION: 2x/wk for 4 wks  PLAN FOR NEXT SESSION: MFR to cording in L axilla,  Continue MLD to LUE and L breast to reduce pain, what did dr say?  [facility]  [address], [address]  [place] [address]  [phone]  Self manual lymph drainage: Do circles behind the collar bones x 10  Perform this sequence once a day.  Only give enough pressure no your skin to make the skin move.  Diaphragmatic - Supine   Inhale through nose making navel move out toward hands. Exhale through puckered lips, hands follow navel in. Repeat _5__ times. Rest _10__ seconds between repeats.   Copyright  VHI. All rights reserved.  Hug yourself.  Do circles at your neck just above your collarbones.  Repeat this 10 times.  Axilla - One at a Time   Using full weight of flat hand and fingers at center of uninvolved (R) armpit, make _10__ in-place circles.   Copyright  VHI. All rights reserved.  LEG: Inguinal Nodes Stimulation   With small finger side of hand against hip crease on involved (L) side, gently perform circles at the crease. Repeat __10_ times.   Copyright  VHI. All rights reserved.  Axilla to Inguinal Nodes - Sweep   On involved side, stretch skin from armpit along side of trunk to hip crease.  Now gently stretch skin from the involved side to the uninvolved side across the chest at the shoulder line.    Move fluid from L armpit in area that feels swollen towards these 2 pathways by gently stretching skin.   Finish by doing circles in R armpit and L groin.   Finish by doing the pathways as described above going from your involved armpit to the same side groin and going across your upper chest from the involved shoulder to the uninvolved shoulder.  Repeat the steps above where you do circles in your left groin and right armpit. Copyright  VHI. All rights  reserved.    [employer], PT 11/07/2023, 11:59 AM

## 2023-11-12 ENCOUNTER — Encounter: Payer: Self-pay | Admitting: Physical Therapy

## 2023-11-12 ENCOUNTER — Ambulatory Visit: Payer: Self-pay | Attending: General Surgery | Admitting: Physical Therapy

## 2023-11-12 DIAGNOSIS — M25612 Stiffness of left shoulder, not elsewhere classified: Secondary | ICD-10-CM | POA: Diagnosis present

## 2023-11-12 DIAGNOSIS — I890 Lymphedema, not elsewhere classified: Secondary | ICD-10-CM | POA: Diagnosis present

## 2023-11-12 DIAGNOSIS — Z170 Estrogen receptor positive status [ER+]: Secondary | ICD-10-CM | POA: Insufficient documentation

## 2023-11-12 DIAGNOSIS — C50412 Malignant neoplasm of upper-outer quadrant of left female breast: Secondary | ICD-10-CM | POA: Diagnosis present

## 2023-11-12 DIAGNOSIS — Z483 Aftercare following surgery for neoplasm: Secondary | ICD-10-CM | POA: Insufficient documentation

## 2023-11-12 DIAGNOSIS — R293 Abnormal posture: Secondary | ICD-10-CM | POA: Diagnosis present

## 2023-11-12 NOTE — Therapy (Signed)
 OUTPATIENT PHYSICAL THERAPY BREAST CANCER TREATMENT   Patient Name: Norma [name] MRN: [id number] DOB:[birth year], 53 y.o., female Today's Date: 11/12/2023  END OF SESSION:  PT End of Session - 11/12/23 1655     Visit Number 19    Number of Visits 23    Date for PT Re-Evaluation 11/26/23    PT Start Time 1605    PT Stop Time 1655    PT Time Calculation (min) 50 min    Activity Tolerance Patient tolerated treatment well    Behavior During Therapy WFL for tasks assessed/performed            Past Medical History:  Diagnosis Date   Allergy    Anemia    hx of   Anxiety    Arthritis    KNEES   Asthma    treated for during COVID tx   Cancer (HCC) 06/2023   left breast IDC   Chronic headache    Colon polyps    TUBULAR ADENOMAS AND HYPERPLASTIC    Complication of anesthesia    DDD (degenerative disc disease), thoracic    Depression    Diabetes mellitus, type 2 (HCC)    Difficult airway for intubation    Fibromyalgia    GERD (gastroesophageal reflux disease)    Goiter    Hyperlipidemia    Hypertension    Low back pain    Obesity    PONV (postoperative nausea and vomiting)    RA (rheumatoid arthritis) (HCC)    Seasonal allergies    Vitamin D  deficiency    Past Surgical History:  Procedure Laterality Date   ABDOMINAL HYSTERECTOMY  2008   BACK SURGERY  2005   lumbar laminectomy   BALLOON DILATION N/A 06/01/2021   Procedure: BALLOON DILATION;  Surgeon: Albertus Gordy HERO, MD;  Location: [facility];  Service: Gastroenterology;  Laterality: N/A;   BARTHOLIN GLAND CYST EXCISION     BIOPSY  06/01/2021   Procedure: BIOPSY;  Surgeon: Albertus Gordy HERO, MD;  Location: [facility];  Service: Gastroenterology;;   BREAST LUMPECTOMY WITH RADIOACTIVE SEED AND SENTINEL LYMPH NODE BIOPSY Left 07/02/2023   Procedure: BREAST LUMPECTOMY WITH RADIOACTIVE SEED AND SENTINEL LYMPH NODE BIOPSY;  Surgeon: Aron Shoulders, MD;  Location: [facility];  Service: General;   Laterality: Left;  90 MIN 78444- EXCISION LEFT CHEST WALL MASS GEN COMBINED WITH REGIONAL   COLONOSCOPY  2017   JMP-MAC-prep good-TA -recall 5 yr   COLONOSCOPY WITH PROPOFOL  N/A 06/01/2021   Procedure: COLONOSCOPY WITH PROPOFOL ;  Surgeon: Albertus Gordy HERO, MD;  Location: [facility];  Service: Gastroenterology;  Laterality: N/A;   ESOPHAGOGASTRODUODENOSCOPY (EGD) WITH PROPOFOL  N/A 06/01/2021   Procedure: ESOPHAGOGASTRODUODENOSCOPY (EGD) WITH PROPOFOL ;  Surgeon: Albertus Gordy HERO, MD;  Location: [facility];  Service: Gastroenterology;  Laterality: N/A;   LUMBAR LAMINECTOMY/DECOMPRESSION MICRODISCECTOMY Right 07/17/2013   Procedure: LUMBAR LAMINECTOMY/DECOMPRESSION MICRODISCECTOMY 1 LEVEL,RIGHT LUMBAR FOUR-FIVE;  Surgeon: Darina MALVA Boehringer, MD;  Location: [facility];  Service: Neurosurgery;  Laterality: Right;  Right   MASS EXCISION Left 07/02/2023   Procedure: EXCISION, MASS, CHEST WALL;  Surgeon: Aron Shoulders, MD;  Location: [facility];  Service: General;  Laterality: Left;   PARTIAL HYSTERECTOMY     POLYPECTOMY  2017   TA and benign polypoid   POLYPECTOMY  06/01/2021   Procedure: POLYPECTOMY;  Surgeon: Albertus Gordy HERO, MD;  Location: [facility];  Service: Gastroenterology;;   WISDOM TOOTH EXTRACTION     Patient Active Problem List  Diagnosis Date Noted   Genetic testing 06/23/2023   Malignant neoplasm of upper-outer quadrant of left breast in female, estrogen receptor positive (HCC) 06/11/2023   Gastritis without bleeding    Esophageal dysphagia    Schatzki's ring    Benign neoplasm of ascending colon    Class 3 severe obesity with serious comorbidity and body mass index (BMI) of 40.0 to 44.9 in adult 08/25/2019   Type 2 diabetes mellitus without complication, without long-term current use of insulin (HCC) 08/25/2019   Chronic cough 06/27/2017   LUQ abdominal pain 12/07/2014   RUQ abdominal pain 11/10/2013   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   Gastroesophageal reflux  disease 12/18/2012   Hx of adenomatous colonic polyps 12/18/2012   Chronic RLQ pain 12/18/2012   IBS (irritable bowel syndrome) 01/22/2011   HTN (hypertension) 01/22/2011   Hyperlipidemia 01/22/2011   Anxiety and depression 01/22/2011    PCP: Haze Servant, NP  REFERRING PROVIDER: Dr. Jina Nephew   REFERRING DIAG: Left breast cancer  THERAPY DIAG:  Lymphedema, not elsewhere classified  Stiffness of left shoulder, not elsewhere classified  Aftercare following surgery for neoplasm  Abnormal posture  Malignant neoplasm of upper-outer quadrant of left breast in female, estrogen receptor positive (HCC)  Rationale for Evaluation and Treatment: Rehabilitation  ONSET DATE: 04/26/23  SUBJECTIVE:                                                                                                                                                                                           SUBJECTIVE STATEMENT: I saw the doctor and she said it was not infected. She said it was a small seroma and probably would not have enough fluid to be drained.   PERTINENT HISTORY:  Patient was diagnosed on 04/26/2023 with left grade 3 invasive ductal carcinoma breast cancer. It measures 1 cm and is located in the upper outer quadrant. It is ER/PR positive and HER2 negative with a Ki67 of 30%. Pt underwent a L lumpectomy and SLNB 0/2 on 07/02/23  PATIENT GOALS:  Reassess how my recovery is going related to arm function, pain, and swelling.  PAIN:  Are you having pain?  1-2/10 shooting pain in inferior L breast while in waiting room but none currently  PRECAUTIONS: Recent Surgery, left UE Lymphedema risk,   RED FLAGS: None   ACTIVITY LEVEL / LEISURE: doing post op exercises   OBJECTIVE:   PATIENT SURVEYS:  QUICK DASH:     OBSERVATIONS: Healing scars with some increased scar tissue at L axilla  POSTURE:  Forward head and rounded shoulders posture   UPPER EXTREMITY AROM/PROM:   A/PROM RIGHT  eval    Shoulder extension 48  Shoulder flexion 153  Shoulder abduction 165  Shoulder internal rotation 63  Shoulder external rotation 84                          (Blank rows = not tested)   A/PROM LEFT   eval LEFT  07/25/23 LEFT 08/08/23 LEFT 10/01/23  Shoulder extension 35 70    Shoulder flexion 150 162 171 176  Shoulder abduction 167 156 151 163  Shoulder internal rotation 63 69    Shoulder external rotation 75 80                            (Blank rows = not tested)   CERVICAL AROM: All within normal limits   UPPER EXTREMITY STRENGTH: WFL   LYMPHEDEMA ASSESSMENTS (in cm):    LANDMARK RIGHT   eval  10 cm proximal to olecranon process 38.8  Olecranon process 28.7  10 cm proximal to ulnar styloid process 25.8  Just proximal to ulnar styloid process 17  Across hand at thumb web space 20.5  At base of 2nd digit 5.9  (Blank rows = not tested)   LANDMARK LEFT   eval LEFT 07/25/23  10 cm proximal to olecranon process 40 38.4  Olecranon process 28.8 29.5  10 cm proximal to ulnar styloid process 24.3 23  Just proximal to ulnar styloid process 16.3 16.5  Across hand at thumb web space 19.5 20  At base of 2nd digit 5.9 5.8  (Blank rows = not tested)  Surgery type/Date: 07/02/23 L breast lumpectomy and SLNB Number of lymph nodes removed: 0/2 Current/past treatment (chemo, radiation, hormone therapy): does not need chemo, will require radiation, will need hormone therapy Other symptoms:  Heaviness/tightness Yes Pain Yes Pitting edema No Infections No Decreased scar mobility Yes Stemmer sign No     TREATMENT PERFORMED: 11/12/23: Manual Therapy: In supine: Short neck, 5 diaphragmatic breaths, R axillary nodes and establishment of interaxillary pathway, L inguinal nodes and establishment of axilloinguinal pathway, then L breast moving fluid towards pathways spending extra time in any areas of fibrosis then retracing all steps then L UE working proximal to distal with  increased time spent at axilla and upper arm then retracing all steps.  MFR to area of cording with UE in to abduction with cording more difficult to palpate but definite areas of tightness In R sidelying to L serratus and lateral breast in area of tightness/fibrosis  11/07/23: Manual Therapy: In supine: Short neck, 5 diaphragmatic breaths, R axillary nodes and establishment of interaxillary pathway, L inguinal nodes and establishment of axilloinguinal pathway, then L breast moving fluid towards pathways spending extra time in any areas of fibrosis then retracing all steps then L UE working proximal to distal with increased time spent at axilla and upper arm then retracing all steps.  MFR to area of cording with UE in to abduction with cording more difficult to palpate but definite areas of tightness  11/05/23: Manual Therapy: In supine: Short neck, 5 diaphragmatic breaths, R axillary nodes and establishment of interaxillary pathway, L inguinal nodes and establishment of axilloinguinal pathway, then L breast moving fluid towards pathways spending extra time in any areas of fibrosis then retracing all steps then L UE working proximal to distal with increased time spent at axilla and upper arm then retracing all steps.  MFR to area of cording with UE in  to abduction with cording more difficult to palpate but definite areas of tightness  10/31/23: Manual Therapy: In supine: Short neck, superficial and deep abdominals, Rt axillary and pectoral nodes, anterior intact thorax sequence, establishment of anterior inter-axillary pathway, Lt inguinal nodes and establishment of Lt axillo-inguinal pathway, then L breast moving fluid towards pathways spending extra time in any areas of fibrosis, then into Rt S/L for focus to fibrosis in lateral breast and found what felt like edges of a seroma near areolar, then finished retracing all steps in supine. Pt reports area that felt like seroma was where she had increased  pain over the weekend to the point she took her compression bra off because she like her breast was trying to stretch out of it.  MFR to area of cording found at last session but hard to palpate cords today, also focused on area at Lt lateral trunk that pt reports felt like a cord over past few days and would feel better when she pushed on it. Found area of tightness that may or may not have been cord, but definitely tight regardless P/ROM to Lt shoulder into flex, abd and D2 with scap depression during for varying stretches for cording tightness  10/29/23: Manual Therapy: In supine: Short neck, 5 diaphragmatic breaths, R axillary nodes and establishment of interaxillary pathway, L inguinal nodes and establishment of axilloinguinal pathway, then L breast moving fluid towards pathways spending extra time in any areas of fibrosis then retracing all steps. MFR to numerous cords in L axilla extending down to upper arm with L UE in 100 degrees of abduction 10/24/23: Manual Therapy: Instructed pt in each step of MLD and had her return demonstrate each step with verbal and tactile cues for speed, pressure, direction of stretch and skin stretch technique. Pt completed first half with cueing and therapist completed second half. MLD to Lt breast and upper arm in supine: Short neck, superficial and deep abdominals, Rt axillary and pectoral nodes and establishment of anterior inter-axillary pathway, Lt inguinal nodes and establishment of axillo-inguinal pathway, then Lt breast moving fluid towards pathways and L axilla spending extra time in any areas of fibrosis then LUE working proximal to distal and retracing all steps   10/21/23: Manual Therapy: MLD to Lt breast and upper arm in supine: Short neck, superficial and deep abdominals, Rt axillary and pectoral nodes and establishment of anterior inter-axillary pathway, Lt inguinal nodes and establishment of axillo-inguinal pathway, then Lt breast moving fluid towards  pathways and L axilla spending extra time in any areas of fibrosis then LUE working proximal to distal and retracing all steps  STM when in Rt sidelying to Lt serratus and lateral trunk, also during P/ROM to same P/ROM to Lt shoulder into flex, abd and D2 with scapular depression by therapist throughout  10/17/23: Manual Therapy: MLD to Lt breast and upper arm in supine: Short neck, superficial and deep abdominals, Rt axillary and pectoral nodes and establishment of anterior inter-axillary pathway, Lt inguinal nodes and establishment of axillo-inguinal pathway, then Lt breast moving fluid towards pathways and L axilla spending extra time in any areas of fibrosis then LUE working proximal to distal and retracing all steps  STM when in Rt sidelying to Lt serratus and lateral trunk Issued instructions for UE MLD  10/15/23: Manual Therapy: MLD to Lt breast and upper arm in supine: Short neck, superficial and deep abdominals, Rt axillary and pectoral nodes and establishment of anterior inter-axillary pathway, Lt inguinal nodes and establishment of axillo-inguinal  pathway, then Lt breast moving fluid towards pathways and L axilla spending extra time in any areas of fibrosis then LUE working proximal to distal and retracing all steps  STM when in Rt sidelying to Lt serratus Educated pt to wear her compression sleeve    The patient was assessed using the L-Dex machine today to produce a lymphedema index baseline score. The patient will be reassessed on a regular basis (typically every 3 months) to obtain new L-Dex scores. If the score is > 6.5 points away from his/her baseline score indicating onset of subclinical lymphedema, it will be recommended to wear a compression garment for 4 weeks, 12 hours per day and then be reassessed. If the score continues to be > 6.5 points from baseline at reassessment, we will initiate lymphedema treatment. Assessing in this manner has a 95% rate of preventing clinically  significant lymphedema.        PATIENT EDUCATION:  Wear compression bra 12 hrs/day for 4 weeks during day time hours Access Code: [email or web address] URL: [email or web address] Date: 08/06/2023 Prepared by: Grayce Sheldon  Exercises - Single Arm Doorway Pec Stretch at 90 Degrees Abduction  - 2 x daily - 7 x weekly - 1 sets - 3 reps - 20-30 hold - Supine Lower Trunk Rotation  - 1 x daily - 7 x weekly - 1 sets - 3 reps - 20-30 hold Education details: nerve desensitization, supine dowel exercises, lymphedema risk reduction and importance of skin care Person educated: Patient Education method: Explanation, Demonstration, Tactile cues, and Handouts Education comprehension: verbalized understanding and returned demonstration  HOME EXERCISE PROGRAM: Reviewed previously given post op HEP. Supine dowel exercises in to abduction and flexion  ASSESSMENT:  CLINICAL IMPRESSION: Pt reports her breast pain is improving and she has not had to take any tylenol . Cording is not palpable but pt still feels tightness in her axilla. Continued with MFR to this area today. Added STM to lateral trunk since she is having discomfort in this area.   Pt will benefit from skilled therapeutic intervention to improve on the following deficits: Decreased knowledge of precautions, impaired UE functional use, pain, decreased ROM, postural dysfunction, increased edema.   PT treatment/interventions: ADL/Self care home management, [phone]- PT Re-evaluation, 97110-Therapeutic exercises, 97530- Therapeutic activity, [id number]- Neuromuscular re-education, 97535- Self Care, 02859- Manual therapy, [id number]- Orthotic Initial, and [id number]- Orthotic/Prosthetic subsequent   GOALS: Goals reviewed with patient? Yes  LONG TERM GOALS:  (STG=LTG)  GOALS Name Target Date  Goal status  1 Pt will demonstrate she has regained full shoulder ROM and function post operatively compared to baselines.  Baseline: 08/22/23 MET  2 Pt will  report no pain at end range of L shoulder abduction to allow improved function. 08/22/23 MET  3 Pt will be independent in a home exercise program for continued stretching and strengthening.  08/22/23 MET  4 Pt will be independent in self MLD for long term management of L breast lymphedema and swelling in L axilla to decrease risk of infection. 10/29/23 MET 10/29/23  5 Pt will report a 50% improvement in pain in L breast to allow improved comfort.  11/26/23 NEW  6 Pt will return to the green on the SOZO demonstrating reversal of subclinical lymphedema.  11/26/23 NEW  7 Pt will demonstrate a 75% improvement in pain and discomfort of L axilla from cording to allow improved comfort and function 11/26/23 NEW     PLAN:  PT FREQUENCY/DURATION: 2x/wk for 4 wks  PLAN FOR NEXT SESSION: MFR to  cording in L axilla,  Continue MLD to LUE and L breast to reduce pain, what did dr say?  [facility]  [address]  [place] [address]  [phone]  Self manual lymph drainage: Do circles behind the collar bones x 10  Perform this sequence once a day.  Only give enough pressure no your skin to make the skin move.  Diaphragmatic - Supine   Inhale through nose making navel move out toward hands. Exhale through puckered lips, hands follow navel in. Repeat _5__ times. Rest _10__ seconds between repeats.   Copyright  VHI. All rights reserved.  Hug yourself.  Do circles at your neck just above your collarbones.  Repeat this 10 times.  Axilla - One at a Time   Using full weight of flat hand and fingers at center of uninvolved (R) armpit, make _10__ in-place circles.   Copyright  VHI. All rights reserved.  LEG: Inguinal Nodes Stimulation   With small finger side of hand against hip crease on involved (L) side, gently perform circles at the crease. Repeat __10_ times.   Copyright  VHI. All rights reserved.  Axilla to Inguinal Nodes - Sweep   On involved side,  stretch skin from armpit along side of trunk to hip crease.  Now gently stretch skin from the involved side to the uninvolved side across the chest at the shoulder line.    Move fluid from L armpit in area that feels swollen towards these 2 pathways by gently stretching skin.   Finish by doing circles in R armpit and L groin.   Finish by doing the pathways as described above going from your involved armpit to the same side groin and going across your upper chest from the involved shoulder to the uninvolved shoulder.  Repeat the steps above where you do circles in your left groin and right armpit. Copyright  VHI. All rights reserved.    [facility], PT 11/12/2023, 5:08 PM

## 2023-11-14 ENCOUNTER — Ambulatory Visit: Payer: Self-pay | Admitting: Physical Therapy

## 2023-11-14 ENCOUNTER — Encounter: Payer: Self-pay | Admitting: Physical Therapy

## 2023-11-14 DIAGNOSIS — I890 Lymphedema, not elsewhere classified: Secondary | ICD-10-CM

## 2023-11-14 DIAGNOSIS — R293 Abnormal posture: Secondary | ICD-10-CM

## 2023-11-14 DIAGNOSIS — Z170 Estrogen receptor positive status [ER+]: Secondary | ICD-10-CM

## 2023-11-14 DIAGNOSIS — M25612 Stiffness of left shoulder, not elsewhere classified: Secondary | ICD-10-CM

## 2023-11-14 DIAGNOSIS — Z483 Aftercare following surgery for neoplasm: Secondary | ICD-10-CM

## 2023-11-14 NOTE — Therapy (Signed)
 OUTPATIENT PHYSICAL THERAPY BREAST CANCER TREATMENT   Patient Name: Norma [name] MRN: [id number] DOB:1970-08-25, 53 y.o., female Today's Date: 11/14/2023  END OF SESSION:  PT End of Session - 11/14/23 1658     Visit Number 20    Number of Visits 23    Date for PT Re-Evaluation 11/26/23    PT Start Time 1559    PT Stop Time 1657    PT Time Calculation (min) 58 min    Activity Tolerance Patient tolerated treatment well    Behavior During Therapy WFL for tasks assessed/performed            Past Medical History:  Diagnosis Date   Allergy    Anemia    hx of   Anxiety    Arthritis    KNEES   Asthma    treated for during COVID tx   Cancer (HCC) 06/2023   left breast IDC   Chronic headache    Colon polyps    TUBULAR ADENOMAS AND HYPERPLASTIC    Complication of anesthesia    DDD (degenerative disc disease), thoracic    Depression    Diabetes mellitus, type 2 (HCC)    Difficult airway for intubation    Fibromyalgia    GERD (gastroesophageal reflux disease)    Goiter    Hyperlipidemia    Hypertension    Low back pain    Obesity    PONV (postoperative nausea and vomiting)    RA (rheumatoid arthritis) (HCC)    Seasonal allergies    Vitamin D  deficiency    Past Surgical History:  Procedure Laterality Date   ABDOMINAL HYSTERECTOMY  2008   BACK SURGERY  2005   lumbar laminectomy   BALLOON DILATION N/A 06/01/2021   Procedure: BALLOON DILATION;  Surgeon: Albertus Gordy HERO, MD;  Location: [facility];  Service: Gastroenterology;  Laterality: N/A;   BARTHOLIN GLAND CYST EXCISION     BIOPSY  06/01/2021   Procedure: BIOPSY;  Surgeon: Albertus Gordy HERO, MD;  Location: [facility];  Service: Gastroenterology;;   BREAST LUMPECTOMY WITH RADIOACTIVE SEED AND SENTINEL LYMPH NODE BIOPSY Left 07/02/2023   Procedure: BREAST LUMPECTOMY WITH RADIOACTIVE SEED AND SENTINEL LYMPH NODE BIOPSY;  Surgeon: Aron Shoulders, MD;  Location: [facility];  Service: General;   Laterality: Left;  90 MIN 78444- EXCISION LEFT CHEST WALL MASS GEN COMBINED WITH REGIONAL   COLONOSCOPY  2017   JMP-MAC-prep good-TA -recall 5 yr   COLONOSCOPY WITH PROPOFOL  N/A 06/01/2021   Procedure: COLONOSCOPY WITH PROPOFOL ;  Surgeon: Albertus Gordy HERO, MD;  Location: [facility];  Service: Gastroenterology;  Laterality: N/A;   ESOPHAGOGASTRODUODENOSCOPY (EGD) WITH PROPOFOL  N/A 06/01/2021   Procedure: ESOPHAGOGASTRODUODENOSCOPY (EGD) WITH PROPOFOL ;  Surgeon: Albertus Gordy HERO, MD;  Location: [facility];  Service: Gastroenterology;  Laterality: N/A;   LUMBAR LAMINECTOMY/DECOMPRESSION MICRODISCECTOMY Right 07/17/2013   Procedure: LUMBAR LAMINECTOMY/DECOMPRESSION MICRODISCECTOMY 1 LEVEL,RIGHT LUMBAR FOUR-FIVE;  Surgeon: Darina MALVA Boehringer, MD;  Location: [facility];  Service: Neurosurgery;  Laterality: Right;  Right   MASS EXCISION Left 07/02/2023   Procedure: EXCISION, MASS, CHEST WALL;  Surgeon: Aron Shoulders, MD;  Location:  SURGERY CENTER;  Service: General;  Laterality: Left;   PARTIAL HYSTERECTOMY     POLYPECTOMY  2017   TA and benign polypoid   POLYPECTOMY  06/01/2021   Procedure: POLYPECTOMY;  Surgeon: Albertus Gordy HERO, MD;  Location: [facility];  Service: Gastroenterology;;   WISDOM TOOTH EXTRACTION     Patient Active Problem List  Diagnosis Date Noted   Genetic testing 06/23/2023   Malignant neoplasm of upper-outer quadrant of left breast in female, estrogen receptor positive (HCC) 06/11/2023   Gastritis without bleeding    Esophageal dysphagia    Schatzki's ring    Benign neoplasm of ascending colon    Class 3 severe obesity with serious comorbidity and body mass index (BMI) of 40.0 to 44.9 in adult 08/25/2019   Type 2 diabetes mellitus without complication, without long-term current use of insulin (HCC) 08/25/2019   Chronic cough 06/27/2017   LUQ abdominal pain 12/07/2014   RUQ abdominal pain 11/10/2013   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   Gastroesophageal reflux  disease 12/18/2012   Hx of adenomatous colonic polyps 12/18/2012   Chronic RLQ pain 12/18/2012   IBS (irritable bowel syndrome) 01/22/2011   HTN (hypertension) 01/22/2011   Hyperlipidemia 01/22/2011   Anxiety and depression 01/22/2011    PCP: Haze Servant, NP  REFERRING PROVIDER: Dr. Jina Nephew   REFERRING DIAG: Left breast cancer  THERAPY DIAG:  Lymphedema, not elsewhere classified  Stiffness of left shoulder, not elsewhere classified  Aftercare following surgery for neoplasm  Abnormal posture  Malignant neoplasm of upper-outer quadrant of left breast in female, estrogen receptor positive (HCC)  Rationale for Evaluation and Treatment: Rehabilitation  ONSET DATE: 04/26/23  SUBJECTIVE:                                                                                                                                                                                           SUBJECTIVE STATEMENT: My breast is very tender to the touch at the inner part. It doesn't look red but I have not felt like eating much today.   PERTINENT HISTORY:  Patient was diagnosed on 04/26/2023 with left grade 3 invasive ductal carcinoma breast cancer. It measures 1 cm and is located in the upper outer quadrant. It is ER/PR positive and HER2 negative with a Ki67 of 30%. Pt underwent a L lumpectomy and SLNB 0/2 on 07/02/23  PATIENT GOALS:  Reassess how my recovery is going related to arm function, pain, and swelling.  PAIN:  Are you having pain?  1-2/10 shooting pain in inferior L breast while in waiting room but none currently  PRECAUTIONS: Recent Surgery, left UE Lymphedema risk,   RED FLAGS: None   ACTIVITY LEVEL / LEISURE: doing post op exercises   OBJECTIVE:   PATIENT SURVEYS:  QUICK DASH:     OBSERVATIONS: Healing scars with some increased scar tissue at L axilla  POSTURE:  Forward head and rounded shoulders posture   UPPER EXTREMITY AROM/PROM:   A/PROM RIGHT   eval  Shoulder extension 48  Shoulder flexion 153  Shoulder abduction 165  Shoulder internal rotation 63  Shoulder external rotation 84                          (Blank rows = not tested)   A/PROM LEFT   eval LEFT  07/25/23 LEFT 08/08/23 LEFT 10/01/23  Shoulder extension 35 70    Shoulder flexion 150 162 171 176  Shoulder abduction 167 156 151 163  Shoulder internal rotation 63 69    Shoulder external rotation 75 80                            (Blank rows = not tested)   CERVICAL AROM: All within normal limits   UPPER EXTREMITY STRENGTH: WFL   LYMPHEDEMA ASSESSMENTS (in cm):    LANDMARK RIGHT   eval  10 cm proximal to olecranon process 38.8  Olecranon process 28.7  10 cm proximal to ulnar styloid process 25.8  Just proximal to ulnar styloid process 17  Across hand at thumb web space 20.5  At base of 2nd digit 5.9  (Blank rows = not tested)   LANDMARK LEFT   eval LEFT 07/25/23  10 cm proximal to olecranon process 40 38.4  Olecranon process 28.8 29.5  10 cm proximal to ulnar styloid process 24.3 23  Just proximal to ulnar styloid process 16.3 16.5  Across hand at thumb web space 19.5 20  At base of 2nd digit 5.9 5.8  (Blank rows = not tested)  Surgery type/Date: 07/02/23 L breast lumpectomy and SLNB Number of lymph nodes removed: 0/2 Current/past treatment (chemo, radiation, hormone therapy): does not need chemo, will require radiation, will need hormone therapy Other symptoms:  Heaviness/tightness Yes Pain Yes Pitting edema No Infections No Decreased scar mobility Yes Stemmer sign No     TREATMENT PERFORMED: 11/14/23: Manual Therapy: In supine: Short neck, 5 diaphragmatic breaths, R axillary nodes and establishment of interaxillary pathway, L inguinal nodes and establishment of axilloinguinal pathway, then L breast moving fluid towards pathways spending extra time in any areas of fibrosis then retracing all steps then L UE working proximal to distal with increased  time spent at axilla and upper arm then retracing all steps.  STM using cocoa butter In R sidelying to L serratus and lateral breast in area of tightness/fibrosis with good improvement noted by end of session   L-DEX FLOWSHEETS - 11/14/23 1600       L-DEX LYMPHEDEMA SCREENING   Measurement Type Unilateral    L-DEX MEASUREMENT EXTREMITY Upper Extremity    POSITION  Standing    DOMINANT SIDE Right    At Risk Side Left    BASELINE SCORE (UNILATERAL) -0.7    L-DEX SCORE (UNILATERAL) -0.3    VALUE CHANGE (UNILAT) 0.4         The patient was assessed using the L-Dex machine today to produce a lymphedema index baseline score. The patient will be reassessed on a regular basis (typically every 3 months) to obtain new L-Dex scores. If the score is > 6.5 points away from his/her baseline score indicating onset of subclinical lymphedema, it will be recommended to wear a compression garment for 4 weeks, 12 hours per day and then be reassessed. If the score continues to be > 6.5 points from baseline at reassessment, we will initiate lymphedema treatment. Assessing in this manner has a 95% rate of preventing  clinically significant lymphedema.   11/12/23: Manual Therapy: In supine: Short neck, 5 diaphragmatic breaths, R axillary nodes and establishment of interaxillary pathway, L inguinal nodes and establishment of axilloinguinal pathway, then L breast moving fluid towards pathways spending extra time in any areas of fibrosis then retracing all steps then L UE working proximal to distal with increased time spent at axilla and upper arm then retracing all steps.  MFR to area of cording with UE in to abduction with cording more difficult to palpate but definite areas of tightness In R sidelying to L serratus and lateral breast in area of tightness/fibrosis  11/07/23: Manual Therapy: In supine: Short neck, 5 diaphragmatic breaths, R axillary nodes and establishment of interaxillary pathway, L inguinal nodes  and establishment of axilloinguinal pathway, then L breast moving fluid towards pathways spending extra time in any areas of fibrosis then retracing all steps then L UE working proximal to distal with increased time spent at axilla and upper arm then retracing all steps.  MFR to area of cording with UE in to abduction with cording more difficult to palpate but definite areas of tightness  11/05/23: Manual Therapy: In supine: Short neck, 5 diaphragmatic breaths, R axillary nodes and establishment of interaxillary pathway, L inguinal nodes and establishment of axilloinguinal pathway, then L breast moving fluid towards pathways spending extra time in any areas of fibrosis then retracing all steps then L UE working proximal to distal with increased time spent at axilla and upper arm then retracing all steps.  MFR to area of cording with UE in to abduction with cording more difficult to palpate but definite areas of tightness  10/31/23: Manual Therapy: In supine: Short neck, superficial and deep abdominals, Rt axillary and pectoral nodes, anterior intact thorax sequence, establishment of anterior inter-axillary pathway, Lt inguinal nodes and establishment of Lt axillo-inguinal pathway, then L breast moving fluid towards pathways spending extra time in any areas of fibrosis, then into Rt S/L for focus to fibrosis in lateral breast and found what felt like edges of a seroma near areolar, then finished retracing all steps in supine. Pt reports area that felt like seroma was where she had increased pain over the weekend to the point she took her compression bra off because she like her breast was trying to stretch out of it.  MFR to area of cording found at last session but hard to palpate cords today, also focused on area at Lt lateral trunk that pt reports felt like a cord over past few days and would feel better when she pushed on it. Found area of tightness that may or may not have been cord, but definitely  tight regardless P/ROM to Lt shoulder into flex, abd and D2 with scap depression during for varying stretches for cording tightness  10/29/23: Manual Therapy: In supine: Short neck, 5 diaphragmatic breaths, R axillary nodes and establishment of interaxillary pathway, L inguinal nodes and establishment of axilloinguinal pathway, then L breast moving fluid towards pathways spending extra time in any areas of fibrosis then retracing all steps. MFR to numerous cords in L axilla extending down to upper arm with L UE in 100 degrees of abduction 10/24/23: Manual Therapy: Instructed pt in each step of MLD and had her return demonstrate each step with verbal and tactile cues for speed, pressure, direction of stretch and skin stretch technique. Pt completed first half with cueing and therapist completed second half. MLD to Lt breast and upper arm in supine: Short neck, superficial and  deep abdominals, Rt axillary and pectoral nodes and establishment of anterior inter-axillary pathway, Lt inguinal nodes and establishment of axillo-inguinal pathway, then Lt breast moving fluid towards pathways and L axilla spending extra time in any areas of fibrosis then LUE working proximal to distal and retracing all steps   10/21/23: Manual Therapy: MLD to Lt breast and upper arm in supine: Short neck, superficial and deep abdominals, Rt axillary and pectoral nodes and establishment of anterior inter-axillary pathway, Lt inguinal nodes and establishment of axillo-inguinal pathway, then Lt breast moving fluid towards pathways and L axilla spending extra time in any areas of fibrosis then LUE working proximal to distal and retracing all steps  STM when in Rt sidelying to Lt serratus and lateral trunk, also during P/ROM to same P/ROM to Lt shoulder into flex, abd and D2 with scapular depression by therapist throughout  10/17/23: Manual Therapy: MLD to Lt breast and upper arm in supine: Short neck, superficial and deep abdominals,  Rt axillary and pectoral nodes and establishment of anterior inter-axillary pathway, Lt inguinal nodes and establishment of axillo-inguinal pathway, then Lt breast moving fluid towards pathways and L axilla spending extra time in any areas of fibrosis then LUE working proximal to distal and retracing all steps  STM when in Rt sidelying to Lt serratus and lateral trunk Issued instructions for UE MLD  10/15/23: Manual Therapy: MLD to Lt breast and upper arm in supine: Short neck, superficial and deep abdominals, Rt axillary and pectoral nodes and establishment of anterior inter-axillary pathway, Lt inguinal nodes and establishment of axillo-inguinal pathway, then Lt breast moving fluid towards pathways and L axilla spending extra time in any areas of fibrosis then LUE working proximal to distal and retracing all steps  STM when in Rt sidelying to Lt serratus Educated pt to wear her compression sleeve  L-DEX FLOWSHEETS - 11/14/23 1600       L-DEX LYMPHEDEMA SCREENING   Measurement Type Unilateral    L-DEX MEASUREMENT EXTREMITY Upper Extremity    POSITION  Standing    DOMINANT SIDE Right    At Risk Side Left    BASELINE SCORE (UNILATERAL) -0.7    L-DEX SCORE (UNILATERAL) -0.3    VALUE CHANGE (UNILAT) 0.4           The patient was assessed using the L-Dex machine today to produce a lymphedema index baseline score. The patient will be reassessed on a regular basis (typically every 3 months) to obtain new L-Dex scores. If the score is > 6.5 points away from his/her baseline score indicating onset of subclinical lymphedema, it will be recommended to wear a compression garment for 4 weeks, 12 hours per day and then be reassessed. If the score continues to be > 6.5 points from baseline at reassessment, we will initiate lymphedema treatment. Assessing in this manner has a 95% rate of preventing clinically significant lymphedema.        PATIENT EDUCATION:  Wear compression bra 12 hrs/day for 4  weeks during day time hours Access Code: [email or web address] URL: [email or web address] Date: 08/06/2023 Prepared by: Grayce Sheldon  Exercises - Single Arm Doorway Pec Stretch at 90 Degrees Abduction  - 2 x daily - 7 x weekly - 1 sets - 3 reps - 20-30 hold - Supine Lower Trunk Rotation  - 1 x daily - 7 x weekly - 1 sets - 3 reps - 20-30 hold Education details: nerve desensitization, supine dowel exercises, lymphedema risk reduction and importance of skin care Person educated: Patient Education  method: Explanation, Demonstration, Tactile cues, and Handouts Education comprehension: verbalized understanding and returned demonstration  HOME EXERCISE PROGRAM: Reviewed previously given post op HEP. Supine dowel exercises in to abduction and flexion  ASSESSMENT:  CLINICAL IMPRESSION: Pt had more discomfort at her medial breast today. Her skin was slightly warm on that side. Educated pt about signs of infection and educated her to see medical care if it worsens or if her breast gets red and she develops a fever. Continued with MLD today and STM to areas of soreness and pt reports that area felt better by end of session. Repeated Ldex screening and pt returned to the green demonstrating reversal of subclinical lymphedema in her arm.   Pt will benefit from skilled therapeutic intervention to improve on the following deficits: Decreased knowledge of precautions, impaired UE functional use, pain, decreased ROM, postural dysfunction, increased edema.   PT treatment/interventions: ADL/Self care home management, [phone]- PT Re-evaluation, 97110-Therapeutic exercises, 97530- Therapeutic activity, [id number]- Neuromuscular re-education, 97535- Self Care, 02859- Manual therapy, [id number]- Orthotic Initial, and [id number]- Orthotic/Prosthetic subsequent   GOALS: Goals reviewed with patient? Yes  LONG TERM GOALS:  (STG=LTG)  GOALS Name Target Date  Goal status  1 Pt will demonstrate she has regained full shoulder  ROM and function post operatively compared to baselines.  Baseline: 08/22/23 MET  2 Pt will report no pain at end range of L shoulder abduction to allow improved function. 08/22/23 MET  3 Pt will be independent in a home exercise program for continued stretching and strengthening.  08/22/23 MET  4 Pt will be independent in self MLD for long term management of L breast lymphedema and swelling in L axilla to decrease risk of infection. 10/29/23 MET 10/29/23  5 Pt will report a 50% improvement in pain in L breast to allow improved comfort.  11/26/23 NEW  6 Pt will return to the green on the SOZO demonstrating reversal of subclinical lymphedema.  11/26/23 NEW  7 Pt will demonstrate a 75% improvement in pain and discomfort of L axilla from cording to allow improved comfort and function 11/26/23 NEW     PLAN:  PT FREQUENCY/DURATION: 2x/wk for 4 wks  PLAN FOR NEXT SESSION: MFR to cording in L axilla,  Continue MLD to LUE and L breast to reduce pain, what did dr say?  [facility]  [address]  [place] [address]  [phone]  Self manual lymph drainage: Do circles behind the collar bones x 10  Perform this sequence once a day.  Only give enough pressure no your skin to make the skin move.  Diaphragmatic - Supine   Inhale through nose making navel move out toward hands. Exhale through puckered lips, hands follow navel in. Repeat _5__ times. Rest _10__ seconds between repeats.   Copyright  VHI. All rights reserved.  Hug yourself.  Do circles at your neck just above your collarbones.  Repeat this 10 times.  Axilla - One at a Time   Using full weight of flat hand and fingers at center of uninvolved (R) armpit, make _10__ in-place circles.   Copyright  VHI. All rights reserved.  LEG: Inguinal Nodes Stimulation   With small finger side of hand against hip crease on involved (L) side, gently perform circles at the crease. Repeat __10_ times.   Copyright   VHI. All rights reserved.  Axilla to Inguinal Nodes - Sweep   On involved side, stretch skin from armpit along side of trunk to hip crease.  Now gently stretch skin from the involved side to the uninvolved side across the chest at the shoulder line.    Move fluid from L armpit in area that feels swollen towards these 2 pathways by gently stretching skin.   Finish by doing circles in R armpit and L groin.   Finish by doing the pathways as described above going from your involved armpit to the same side groin and going across your upper chest from the involved shoulder to the uninvolved shoulder.  Repeat the steps above where you do circles in your left groin and right armpit. Copyright  VHI. All rights reserved.    [address] [place], PT 11/14/2023, 5:02 PM

## 2023-11-19 ENCOUNTER — Ambulatory Visit: Payer: Self-pay | Admitting: Physical Therapy

## 2023-11-19 ENCOUNTER — Encounter: Payer: Self-pay | Admitting: Physical Therapy

## 2023-11-19 DIAGNOSIS — I890 Lymphedema, not elsewhere classified: Secondary | ICD-10-CM

## 2023-11-19 DIAGNOSIS — M25612 Stiffness of left shoulder, not elsewhere classified: Secondary | ICD-10-CM

## 2023-11-19 DIAGNOSIS — R293 Abnormal posture: Secondary | ICD-10-CM

## 2023-11-19 DIAGNOSIS — Z483 Aftercare following surgery for neoplasm: Secondary | ICD-10-CM

## 2023-11-19 DIAGNOSIS — C50412 Malignant neoplasm of upper-outer quadrant of left female breast: Secondary | ICD-10-CM

## 2023-11-19 NOTE — Therapy (Signed)
 OUTPATIENT PHYSICAL THERAPY BREAST CANCER TREATMENT   Patient Name: Norma [name] MRN: [id number] DOB:[birth year], 53 y.o., female Today's Date: 11/19/2023  END OF SESSION:  PT End of Session - 11/19/23 1608     Visit Number 21    Number of Visits 23    Date for PT Re-Evaluation 11/26/23    PT Start Time 1604    PT Stop Time 1653    PT Time Calculation (min) 49 min    Activity Tolerance Patient tolerated treatment well    Behavior During Therapy WFL for tasks assessed/performed            Past Medical History:  Diagnosis Date   Allergy    Anemia    hx of   Anxiety    Arthritis    KNEES   Asthma    treated for during COVID tx   Cancer (HCC) 06/2023   left breast IDC   Chronic headache    Colon polyps    TUBULAR ADENOMAS AND HYPERPLASTIC    Complication of anesthesia    DDD (degenerative disc disease), thoracic    Depression    Diabetes mellitus, type 2 (HCC)    Difficult airway for intubation    Fibromyalgia    GERD (gastroesophageal reflux disease)    Goiter    Hyperlipidemia    Hypertension    Low back pain    Obesity    PONV (postoperative nausea and vomiting)    RA (rheumatoid arthritis) (HCC)    Seasonal allergies    Vitamin D  deficiency    Past Surgical History:  Procedure Laterality Date   ABDOMINAL HYSTERECTOMY  2008   BACK SURGERY  2005   lumbar laminectomy   BALLOON DILATION N/A 06/01/2021   Procedure: BALLOON DILATION;  Surgeon: Albertus Gordy HERO, MD;  Location: [facility];  Service: Gastroenterology;  Laterality: N/A;   BARTHOLIN GLAND CYST EXCISION     BIOPSY  06/01/2021   Procedure: BIOPSY;  Surgeon: Albertus Gordy HERO, MD;  Location: [facility];  Service: Gastroenterology;;   BREAST LUMPECTOMY WITH RADIOACTIVE SEED AND SENTINEL LYMPH NODE BIOPSY Left 07/02/2023   Procedure: BREAST LUMPECTOMY WITH RADIOACTIVE SEED AND SENTINEL LYMPH NODE BIOPSY;  Surgeon: Aron Shoulders, MD;  Location: [facility];  Service: General;   Laterality: Left;  90 MIN 78444- EXCISION LEFT CHEST WALL MASS GEN COMBINED WITH REGIONAL   COLONOSCOPY  2017   JMP-MAC-prep good-TA -recall 5 yr   COLONOSCOPY WITH PROPOFOL  N/A 06/01/2021   Procedure: COLONOSCOPY WITH PROPOFOL ;  Surgeon: Albertus Gordy HERO, MD;  Location: [facility];  Service: Gastroenterology;  Laterality: N/A;   ESOPHAGOGASTRODUODENOSCOPY (EGD) WITH PROPOFOL  N/A 06/01/2021   Procedure: ESOPHAGOGASTRODUODENOSCOPY (EGD) WITH PROPOFOL ;  Surgeon: Albertus Gordy HERO, MD;  Location: [facility];  Service: Gastroenterology;  Laterality: N/A;   LUMBAR LAMINECTOMY/DECOMPRESSION MICRODISCECTOMY Right 07/17/2013   Procedure: LUMBAR LAMINECTOMY/DECOMPRESSION MICRODISCECTOMY 1 LEVEL,RIGHT LUMBAR FOUR-FIVE;  Surgeon: Darina MALVA Boehringer, MD;  Location: [facility];  Service: Neurosurgery;  Laterality: Right;  Right   MASS EXCISION Left 07/02/2023   Procedure: EXCISION, MASS, CHEST WALL;  Surgeon: Aron Shoulders, MD;  Location: [facility];  Service: General;  Laterality: Left;   PARTIAL HYSTERECTOMY     POLYPECTOMY  2017   TA and benign polypoid   POLYPECTOMY  06/01/2021   Procedure: POLYPECTOMY;  Surgeon: Albertus Gordy HERO, MD;  Location: [facility];  Service: Gastroenterology;;   WISDOM TOOTH EXTRACTION     Patient Active Problem List  Diagnosis Date Noted   Genetic testing 06/23/2023   Malignant neoplasm of upper-outer quadrant of left breast in female, estrogen receptor positive (HCC) 06/11/2023   Gastritis without bleeding    Esophageal dysphagia    Schatzki's ring    Benign neoplasm of ascending colon    Class 3 severe obesity with serious comorbidity and body mass index (BMI) of 40.0 to 44.9 in adult 08/25/2019   Type 2 diabetes mellitus without complication, without long-term current use of insulin (HCC) 08/25/2019   Chronic cough 06/27/2017   LUQ abdominal pain 12/07/2014   RUQ abdominal pain 11/10/2013   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   Gastroesophageal reflux  disease 12/18/2012   Hx of adenomatous colonic polyps 12/18/2012   Chronic RLQ pain 12/18/2012   IBS (irritable bowel syndrome) 01/22/2011   HTN (hypertension) 01/22/2011   Hyperlipidemia 01/22/2011   Anxiety and depression 01/22/2011    PCP: Haze Servant, NP  REFERRING PROVIDER: Dr. Jina Nephew   REFERRING DIAG: Left breast cancer  THERAPY DIAG:  Lymphedema, not elsewhere classified  Stiffness of left shoulder, not elsewhere classified  Aftercare following surgery for neoplasm  Abnormal posture  Malignant neoplasm of upper-outer quadrant of left breast in female, estrogen receptor positive (HCC)  Rationale for Evaluation and Treatment: Rehabilitation  ONSET DATE: 04/26/23  SUBJECTIVE:                                                                                                                                                                                           SUBJECTIVE STATEMENT: My sugar has been low today but it is ok right now. My breast has been hurting all weekend. I felt like it was shaped more like a cone.   PERTINENT HISTORY:  Patient was diagnosed on 04/26/2023 with left grade 3 invasive ductal carcinoma breast cancer. It measures 1 cm and is located in the upper outer quadrant. It is ER/PR positive and HER2 negative with a Ki67 of 30%. Pt underwent a L lumpectomy and SLNB 0/2 on 07/02/23  PATIENT GOALS:  Reassess how my recovery is going related to arm function, pain, and swelling.  PAIN:  Are you having pain?  2-3/10 dull lingering pain in middle of  L breast   PRECAUTIONS: Recent Surgery, left UE Lymphedema risk,   RED FLAGS: None   ACTIVITY LEVEL / LEISURE: doing post op exercises   OBJECTIVE:   PATIENT SURVEYS:  QUICK DASH:     OBSERVATIONS: Healing scars with some increased scar tissue at L axilla  POSTURE:  Forward head and rounded shoulders posture   UPPER EXTREMITY AROM/PROM:   A/PROM RIGHT   eval  Shoulder extension  48  Shoulder flexion 153  Shoulder abduction 165  Shoulder internal rotation 63  Shoulder external rotation 84                          (Blank rows = not tested)   A/PROM LEFT   eval LEFT  07/25/23 LEFT 08/08/23 LEFT 10/01/23  Shoulder extension 35 70    Shoulder flexion 150 162 171 176  Shoulder abduction 167 156 151 163  Shoulder internal rotation 63 69    Shoulder external rotation 75 80                            (Blank rows = not tested)   CERVICAL AROM: All within normal limits   UPPER EXTREMITY STRENGTH: WFL   LYMPHEDEMA ASSESSMENTS (in cm):    LANDMARK RIGHT   eval  10 cm proximal to olecranon process 38.8  Olecranon process 28.7  10 cm proximal to ulnar styloid process 25.8  Just proximal to ulnar styloid process 17  Across hand at thumb web space 20.5  At base of 2nd digit 5.9  (Blank rows = not tested)   LANDMARK LEFT   eval LEFT 07/25/23  10 cm proximal to olecranon process 40 38.4  Olecranon process 28.8 29.5  10 cm proximal to ulnar styloid process 24.3 23  Just proximal to ulnar styloid process 16.3 16.5  Across hand at thumb web space 19.5 20  At base of 2nd digit 5.9 5.8  (Blank rows = not tested)  Surgery type/Date: 07/02/23 L breast lumpectomy and SLNB Number of lymph nodes removed: 0/2 Current/past treatment (chemo, radiation, hormone therapy): does not need chemo, will require radiation, will need hormone therapy Other symptoms:  Heaviness/tightness Yes Pain Yes Pitting edema No Infections No Decreased scar mobility Yes Stemmer sign No     TREATMENT PERFORMED: 11/19/23: Manual Therapy: In supine: Short neck, 5 diaphragmatic breaths, R axillary nodes and establishment of interaxillary pathway, L inguinal nodes and establishment of axilloinguinal pathway, then L breast moving fluid towards pathways spending extra time in any areas of fibrosis then retracing all steps then L UE working proximal to distal with increased time spent at axilla  and upper arm then retracing all steps.  STM using cocoa butter In R sidelying to L serratus and lateral breast in area of tightness/fibrosis with good improvement noted by end of session  11/14/23: Manual Therapy: In supine: Short neck, 5 diaphragmatic breaths, R axillary nodes and establishment of interaxillary pathway, L inguinal nodes and establishment of axilloinguinal pathway, then L breast moving fluid towards pathways spending extra time in any areas of fibrosis then retracing all steps then L UE working proximal to distal with increased time spent at axilla and upper arm then retracing all steps.  STM using cocoa butter In R sidelying to L serratus and lateral breast in area of tightness/fibrosis with good improvement noted by end of session    The patient was assessed using the L-Dex machine today to produce a lymphedema index baseline score. The patient will be reassessed on a regular basis (typically every 3 months) to obtain new L-Dex scores. If the score is > 6.5 points away from his/her baseline score indicating onset of subclinical lymphedema, it will be recommended to wear a compression garment for 4 weeks, 12 hours per day and then be reassessed. If the score continues to be > 6.5 points  from baseline at reassessment, we will initiate lymphedema treatment. Assessing in this manner has a 95% rate of preventing clinically significant lymphedema.   11/12/23: Manual Therapy: In supine: Short neck, 5 diaphragmatic breaths, R axillary nodes and establishment of interaxillary pathway, L inguinal nodes and establishment of axilloinguinal pathway, then L breast moving fluid towards pathways spending extra time in any areas of fibrosis then retracing all steps then L UE working proximal to distal with increased time spent at axilla and upper arm then retracing all steps.  MFR to area of cording with UE in to abduction with cording more difficult to palpate but definite areas of tightness In R  sidelying to L serratus and lateral breast in area of tightness/fibrosis  11/07/23: Manual Therapy: In supine: Short neck, 5 diaphragmatic breaths, R axillary nodes and establishment of interaxillary pathway, L inguinal nodes and establishment of axilloinguinal pathway, then L breast moving fluid towards pathways spending extra time in any areas of fibrosis then retracing all steps then L UE working proximal to distal with increased time spent at axilla and upper arm then retracing all steps.  MFR to area of cording with UE in to abduction with cording more difficult to palpate but definite areas of tightness  11/05/23: Manual Therapy: In supine: Short neck, 5 diaphragmatic breaths, R axillary nodes and establishment of interaxillary pathway, L inguinal nodes and establishment of axilloinguinal pathway, then L breast moving fluid towards pathways spending extra time in any areas of fibrosis then retracing all steps then L UE working proximal to distal with increased time spent at axilla and upper arm then retracing all steps.  MFR to area of cording with UE in to abduction with cording more difficult to palpate but definite areas of tightness  10/31/23: Manual Therapy: In supine: Short neck, superficial and deep abdominals, Rt axillary and pectoral nodes, anterior intact thorax sequence, establishment of anterior inter-axillary pathway, Lt inguinal nodes and establishment of Lt axillo-inguinal pathway, then L breast moving fluid towards pathways spending extra time in any areas of fibrosis, then into Rt S/L for focus to fibrosis in lateral breast and found what felt like edges of a seroma near areolar, then finished retracing all steps in supine. Pt reports area that felt like seroma was where she had increased pain over the weekend to the point she took her compression bra off because she like her breast was trying to stretch out of it.  MFR to area of cording found at last session but hard to palpate  cords today, also focused on area at Lt lateral trunk that pt reports felt like a cord over past few days and would feel better when she pushed on it. Found area of tightness that may or may not have been cord, but definitely tight regardless P/ROM to Lt shoulder into flex, abd and D2 with scap depression during for varying stretches for cording tightness  10/29/23: Manual Therapy: In supine: Short neck, 5 diaphragmatic breaths, R axillary nodes and establishment of interaxillary pathway, L inguinal nodes and establishment of axilloinguinal pathway, then L breast moving fluid towards pathways spending extra time in any areas of fibrosis then retracing all steps. MFR to numerous cords in L axilla extending down to upper arm with L UE in 100 degrees of abduction 10/24/23: Manual Therapy: Instructed pt in each step of MLD and had her return demonstrate each step with verbal and tactile cues for speed, pressure, direction of stretch and skin stretch technique. Pt completed first half with  cueing and therapist completed second half. MLD to Lt breast and upper arm in supine: Short neck, superficial and deep abdominals, Rt axillary and pectoral nodes and establishment of anterior inter-axillary pathway, Lt inguinal nodes and establishment of axillo-inguinal pathway, then Lt breast moving fluid towards pathways and L axilla spending extra time in any areas of fibrosis then LUE working proximal to distal and retracing all steps   10/21/23: Manual Therapy: MLD to Lt breast and upper arm in supine: Short neck, superficial and deep abdominals, Rt axillary and pectoral nodes and establishment of anterior inter-axillary pathway, Lt inguinal nodes and establishment of axillo-inguinal pathway, then Lt breast moving fluid towards pathways and L axilla spending extra time in any areas of fibrosis then LUE working proximal to distal and retracing all steps  STM when in Rt sidelying to Lt serratus and lateral trunk, also  during P/ROM to same P/ROM to Lt shoulder into flex, abd and D2 with scapular depression by therapist throughout  10/17/23: Manual Therapy: MLD to Lt breast and upper arm in supine: Short neck, superficial and deep abdominals, Rt axillary and pectoral nodes and establishment of anterior inter-axillary pathway, Lt inguinal nodes and establishment of axillo-inguinal pathway, then Lt breast moving fluid towards pathways and L axilla spending extra time in any areas of fibrosis then LUE working proximal to distal and retracing all steps  STM when in Rt sidelying to Lt serratus and lateral trunk Issued instructions for UE MLD  10/15/23: Manual Therapy: MLD to Lt breast and upper arm in supine: Short neck, superficial and deep abdominals, Rt axillary and pectoral nodes and establishment of anterior inter-axillary pathway, Lt inguinal nodes and establishment of axillo-inguinal pathway, then Lt breast moving fluid towards pathways and L axilla spending extra time in any areas of fibrosis then LUE working proximal to distal and retracing all steps  STM when in Rt sidelying to Lt serratus Educated pt to wear her compression sleeve     The patient was assessed using the L-Dex machine today to produce a lymphedema index baseline score. The patient will be reassessed on a regular basis (typically every 3 months) to obtain new L-Dex scores. If the score is > 6.5 points away from his/her baseline score indicating onset of subclinical lymphedema, it will be recommended to wear a compression garment for 4 weeks, 12 hours per day and then be reassessed. If the score continues to be > 6.5 points from baseline at reassessment, we will initiate lymphedema treatment. Assessing in this manner has a 95% rate of preventing clinically significant lymphedema.        PATIENT EDUCATION:  Wear compression bra 12 hrs/day for 4 weeks during day time hours Access Code: [email or web address] URL:  [email or web address] Date: 08/06/2023 Prepared by: Grayce Sheldon  Exercises - Single Arm Doorway Pec Stretch at 90 Degrees Abduction  - 2 x daily - 7 x weekly - 1 sets - 3 reps - 20-30 hold - Supine Lower Trunk Rotation  - 1 x daily - 7 x weekly - 1 sets - 3 reps - 20-30 hold Education details: nerve desensitization, supine dowel exercises, lymphedema risk reduction and importance of skin care Person educated: Patient Education method: Explanation, Demonstration, Tactile cues, and Handouts Education comprehension: verbalized understanding and returned demonstration  HOME EXERCISE PROGRAM: Reviewed previously given post op HEP. Supine dowel exercises in to abduction and flexion  ASSESSMENT:  CLINICAL IMPRESSION: Pt continues to having increased discomfort in her medial breast despite self MLD. She has an appointment with  her surgeon at the end of the month and will discuss it with her. She still does not have any redness and does not feel bad. She still has some fibrosis present at inferior lateral breast.   Pt will benefit from skilled therapeutic intervention to improve on the following deficits: Decreased knowledge of precautions, impaired UE functional use, pain, decreased ROM, postural dysfunction, increased edema.   PT treatment/interventions: ADL/Self care home management, [phone]- PT Re-evaluation, 97110-Therapeutic exercises, 97530- Therapeutic activity, [id number]- Neuromuscular re-education, 97535- Self Care, 02859- Manual therapy, [id number]- Orthotic Initial, and [id number]- Orthotic/Prosthetic subsequent   GOALS: Goals reviewed with patient? Yes  LONG TERM GOALS:  (STG=LTG)  GOALS Name Target Date  Goal status  1 Pt will demonstrate she has regained full shoulder ROM and function post operatively compared to baselines.  Baseline: 08/22/23 MET  2 Pt will report no pain at end range of L shoulder abduction to allow improved function. 08/22/23 MET  3 Pt will be independent  in a home exercise program for continued stretching and strengthening.  08/22/23 MET  4 Pt will be independent in self MLD for long term management of L breast lymphedema and swelling in L axilla to decrease risk of infection. 10/29/23 MET 10/29/23  5 Pt will report a 50% improvement in pain in L breast to allow improved comfort.  11/26/23 NEW  6 Pt will return to the green on the SOZO demonstrating reversal of subclinical lymphedema.  11/26/23 NEW  7 Pt will demonstrate a 75% improvement in pain and discomfort of L axilla from cording to allow improved comfort and function 11/26/23 NEW     PLAN:  PT FREQUENCY/DURATION: 2x/wk for 4 wks  PLAN FOR NEXT SESSION: MFR to cording in L axilla,  Continue MLD to LUE and L breast to reduce pain, what did dr say?  [facility]  [address], [address]  [place] [address]  [phone]  Self manual lymph drainage: Do circles behind the collar bones x 10  Perform this sequence once a day.  Only give enough pressure no your skin to make the skin move.  Diaphragmatic - Supine   Inhale through nose making navel move out toward hands. Exhale through puckered lips, hands follow navel in. Repeat _5__ times. Rest _10__ seconds between repeats.   Copyright  VHI. All rights reserved.  Hug yourself.  Do circles at your neck just above your collarbones.  Repeat this 10 times.  Axilla - One at a Time   Using full weight of flat hand and fingers at center of uninvolved (R) armpit, make _10__ in-place circles.   Copyright  VHI. All rights reserved.  LEG: Inguinal Nodes Stimulation   With small finger side of hand against hip crease on involved (L) side, gently perform circles at the crease. Repeat __10_ times.   Copyright  VHI. All rights reserved.  Axilla to Inguinal Nodes - Sweep   On involved side, stretch skin from armpit along side of trunk to hip crease.  Now gently stretch skin from the involved side to the  uninvolved side across the chest at the shoulder line.    Move fluid from L armpit in area that feels swollen towards these 2 pathways by gently stretching skin.   Finish by doing circles in R armpit and L groin.   Finish by doing the pathways as described above going from your involved armpit to the same side groin and going across your upper chest from the involved shoulder  to the uninvolved shoulder.  Repeat the steps above where you do circles in your left groin and right armpit. Copyright  VHI. All rights reserved.    [facility] [place], PT 11/19/2023, 4:59 PM

## 2023-11-21 ENCOUNTER — Ambulatory Visit: Payer: Self-pay | Admitting: Physical Therapy

## 2023-11-21 ENCOUNTER — Encounter: Payer: Self-pay | Admitting: Physical Therapy

## 2023-11-21 DIAGNOSIS — R293 Abnormal posture: Secondary | ICD-10-CM

## 2023-11-21 DIAGNOSIS — M25612 Stiffness of left shoulder, not elsewhere classified: Secondary | ICD-10-CM

## 2023-11-21 DIAGNOSIS — Z170 Estrogen receptor positive status [ER+]: Secondary | ICD-10-CM

## 2023-11-21 DIAGNOSIS — Z483 Aftercare following surgery for neoplasm: Secondary | ICD-10-CM

## 2023-11-21 DIAGNOSIS — I890 Lymphedema, not elsewhere classified: Secondary | ICD-10-CM

## 2023-11-21 NOTE — Therapy (Signed)
 OUTPATIENT PHYSICAL THERAPY BREAST CANCER TREATMENT   Patient Name: Norma [name] MRN: [id number] DOB:[birth year], 53 y.o., female Today's Date: 11/21/2023  END OF SESSION:  PT End of Session - 11/21/23 1607     Visit Number 22    Number of Visits 23    Date for PT Re-Evaluation 11/26/23    PT Start Time 1605    PT Stop Time 1653    PT Time Calculation (min) 48 min    Activity Tolerance Patient tolerated treatment well    Behavior During Therapy [facility] for tasks assessed/performed            Past Medical History:  Diagnosis Date   Allergy    Anemia    hx of   Anxiety    Arthritis    KNEES   Asthma    treated for during COVID tx   Cancer (HCC) 06/2023   left breast IDC   Chronic headache    Colon polyps    TUBULAR ADENOMAS AND HYPERPLASTIC    Complication of anesthesia    DDD (degenerative disc disease), thoracic    Depression    Diabetes mellitus, type 2 (HCC)    Difficult airway for intubation    Fibromyalgia    GERD (gastroesophageal reflux disease)    Goiter    Hyperlipidemia    Hypertension    Low back pain    Obesity    PONV (postoperative nausea and vomiting)    RA (rheumatoid arthritis) (HCC)    Seasonal allergies    Vitamin D  deficiency    Past Surgical History:  Procedure Laterality Date   ABDOMINAL HYSTERECTOMY  2008   BACK SURGERY  2005   lumbar laminectomy   BALLOON DILATION N/A 06/01/2021   Procedure: BALLOON DILATION;  Surgeon: Albertus Gordy HERO, MD;  Location: [facility];  Service: Gastroenterology;  Laterality: N/A;   BARTHOLIN GLAND CYST EXCISION     BIOPSY  06/01/2021   Procedure: BIOPSY;  Surgeon: Albertus Gordy HERO, MD;  Location: [facility];  Service: Gastroenterology;;   BREAST LUMPECTOMY WITH RADIOACTIVE SEED AND SENTINEL LYMPH NODE BIOPSY Left 07/02/2023   Procedure: BREAST LUMPECTOMY WITH RADIOACTIVE SEED AND SENTINEL LYMPH NODE BIOPSY;  Surgeon: Aron Shoulders, MD;  Location: [facility];  Service: General;   Laterality: Left;  90 MIN 78444- EXCISION LEFT CHEST WALL MASS GEN COMBINED WITH REGIONAL   COLONOSCOPY  2017   JMP-MAC-prep good-TA -recall 5 yr   COLONOSCOPY WITH PROPOFOL  N/A 06/01/2021   Procedure: COLONOSCOPY WITH PROPOFOL ;  Surgeon: Albertus Gordy HERO, MD;  Location: [facility];  Service: Gastroenterology;  Laterality: N/A;   ESOPHAGOGASTRODUODENOSCOPY (EGD) WITH PROPOFOL  N/A 06/01/2021   Procedure: ESOPHAGOGASTRODUODENOSCOPY (EGD) WITH PROPOFOL ;  Surgeon: Albertus Gordy HERO, MD;  Location: [facility];  Service: Gastroenterology;  Laterality: N/A;   LUMBAR LAMINECTOMY/DECOMPRESSION MICRODISCECTOMY Right 07/17/2013   Procedure: LUMBAR LAMINECTOMY/DECOMPRESSION MICRODISCECTOMY 1 LEVEL,RIGHT LUMBAR FOUR-FIVE;  Surgeon: Darina MALVA Boehringer, MD;  Location: [facility];  Service: Neurosurgery;  Laterality: Right;  Right   MASS EXCISION Left 07/02/2023   Procedure: EXCISION, MASS, CHEST WALL;  Surgeon: Aron Shoulders, MD;  Location: [facility];  Service: General;  Laterality: Left;   PARTIAL HYSTERECTOMY     POLYPECTOMY  2017   TA and benign polypoid   POLYPECTOMY  06/01/2021   Procedure: POLYPECTOMY;  Surgeon: Albertus Gordy HERO, MD;  Location: [facility];  Service: Gastroenterology;;   WISDOM TOOTH EXTRACTION     Patient Active Problem List  Diagnosis Date Noted   Genetic testing 06/23/2023   Malignant neoplasm of upper-outer quadrant of left breast in female, estrogen receptor positive (HCC) 06/11/2023   Gastritis without bleeding    Esophageal dysphagia    Schatzki's ring    Benign neoplasm of ascending colon    Class 3 severe obesity with serious comorbidity and body mass index (BMI) of 40.0 to 44.9 in adult 08/25/2019   Type 2 diabetes mellitus without complication, without long-term current use of insulin (HCC) 08/25/2019   Chronic cough 06/27/2017   LUQ abdominal pain 12/07/2014   RUQ abdominal pain 11/10/2013   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   Gastroesophageal reflux  disease 12/18/2012   Hx of adenomatous colonic polyps 12/18/2012   Chronic RLQ pain 12/18/2012   IBS (irritable bowel syndrome) 01/22/2011   HTN (hypertension) 01/22/2011   Hyperlipidemia 01/22/2011   Anxiety and depression 01/22/2011    PCP: Haze Servant, NP  REFERRING PROVIDER: Dr. Jina Nephew   REFERRING DIAG: Left breast cancer  THERAPY DIAG:  Lymphedema, not elsewhere classified  Stiffness of left shoulder, not elsewhere classified  Aftercare following surgery for neoplasm  Abnormal posture  Malignant neoplasm of upper-outer quadrant of left breast in female, estrogen receptor positive (HCC)  Rationale for Evaluation and Treatment: Rehabilitation  ONSET DATE: 04/26/23  SUBJECTIVE:                                                                                                                                                                                           SUBJECTIVE STATEMENT: My breast has not bothered my any today. Today is feels pretty good.   PERTINENT HISTORY:  Patient was diagnosed on 04/26/2023 with left grade 3 invasive ductal carcinoma breast cancer. It measures 1 cm and is located in the upper outer quadrant. It is ER/PR positive and HER2 negative with a Ki67 of 30%. Pt underwent a L lumpectomy and SLNB 0/2 on 07/02/23  PATIENT GOALS:  Reassess how my recovery is going related to arm function, pain, and swelling.  PAIN:  Are you having pain?  No pain  PRECAUTIONS: Recent Surgery, left UE Lymphedema risk,   RED FLAGS: None   ACTIVITY LEVEL / LEISURE: doing post op exercises   OBJECTIVE:   PATIENT SURVEYS:  QUICK DASH:     OBSERVATIONS: Healing scars with some increased scar tissue at L axilla  POSTURE:  Forward head and rounded shoulders posture   UPPER EXTREMITY AROM/PROM:   A/PROM RIGHT   eval    Shoulder extension 48  Shoulder flexion 153  Shoulder abduction 165  Shoulder internal rotation 63  Shoulder external rotation  84                          (  Blank rows = not tested)   A/PROM LEFT   eval LEFT  07/25/23 LEFT 08/08/23 LEFT 10/01/23  Shoulder extension 35 70    Shoulder flexion 150 162 171 176  Shoulder abduction 167 156 151 163  Shoulder internal rotation 63 69    Shoulder external rotation 75 80                            (Blank rows = not tested)   CERVICAL AROM: All within normal limits   UPPER EXTREMITY STRENGTH: WFL   LYMPHEDEMA ASSESSMENTS (in cm):    LANDMARK RIGHT   eval  10 cm proximal to olecranon process 38.8  Olecranon process 28.7  10 cm proximal to ulnar styloid process 25.8  Just proximal to ulnar styloid process 17  Across hand at thumb web space 20.5  At base of 2nd digit 5.9  (Blank rows = not tested)   LANDMARK LEFT   eval LEFT 07/25/23  10 cm proximal to olecranon process 40 38.4  Olecranon process 28.8 29.5  10 cm proximal to ulnar styloid process 24.3 23  Just proximal to ulnar styloid process 16.3 16.5  Across hand at thumb web space 19.5 20  At base of 2nd digit 5.9 5.8  (Blank rows = not tested)  Surgery type/Date: 07/02/23 L breast lumpectomy and SLNB Number of lymph nodes removed: 0/2 Current/past treatment (chemo, radiation, hormone therapy): does not need chemo, will require radiation, will need hormone therapy Other symptoms:  Heaviness/tightness Yes Pain Yes Pitting edema No Infections No Decreased scar mobility Yes Stemmer sign No     TREATMENT PERFORMED: 11/21/23: Manual Therapy: In supine: Short neck, 5 diaphragmatic breaths, R axillary nodes and establishment of interaxillary pathway, L inguinal nodes and establishment of axilloinguinal pathway, then L breast moving fluid towards pathways spending extra time in any areas of fibrosis then retracing all steps then L UE working proximal to distal with increased time spent at axilla and upper arm then retracing all steps.  MFR to L axilla STM using cocoa butter and WAVE tool In R sidelying to  L serratus and lateral breast in area of tightness/fibrosis with good improvement noted by end of session  11/19/23: Manual Therapy: In supine: Short neck, 5 diaphragmatic breaths, R axillary nodes and establishment of interaxillary pathway, L inguinal nodes and establishment of axilloinguinal pathway, then L breast moving fluid towards pathways spending extra time in any areas of fibrosis then retracing all steps then L UE working proximal to distal with increased time spent at axilla and upper arm then retracing all steps.  STM using cocoa butter In R sidelying to L serratus and lateral breast in area of tightness/fibrosis with good improvement noted by end of session  11/14/23: Manual Therapy: In supine: Short neck, 5 diaphragmatic breaths, R axillary nodes and establishment of interaxillary pathway, L inguinal nodes and establishment of axilloinguinal pathway, then L breast moving fluid towards pathways spending extra time in any areas of fibrosis then retracing all steps then L UE working proximal to distal with increased time spent at axilla and upper arm then retracing all steps.  STM using cocoa butter In R sidelying to L serratus and lateral breast in area of tightness/fibrosis with good improvement noted by end of session    The patient was assessed using the L-Dex machine today to produce a lymphedema index baseline score. The patient will be reassessed on a regular basis (typically every 3  months) to obtain new L-Dex scores. If the score is > 6.5 points away from his/her baseline score indicating onset of subclinical lymphedema, it will be recommended to wear a compression garment for 4 weeks, 12 hours per day and then be reassessed. If the score continues to be > 6.5 points from baseline at reassessment, we will initiate lymphedema treatment. Assessing in this manner has a 95% rate of preventing clinically significant lymphedema.   11/12/23: Manual Therapy: In supine: Short neck, 5  diaphragmatic breaths, R axillary nodes and establishment of interaxillary pathway, L inguinal nodes and establishment of axilloinguinal pathway, then L breast moving fluid towards pathways spending extra time in any areas of fibrosis then retracing all steps then L UE working proximal to distal with increased time spent at axilla and upper arm then retracing all steps.  MFR to area of cording with UE in to abduction with cording more difficult to palpate but definite areas of tightness In R sidelying to L serratus and lateral breast in area of tightness/fibrosis  11/07/23: Manual Therapy: In supine: Short neck, 5 diaphragmatic breaths, R axillary nodes and establishment of interaxillary pathway, L inguinal nodes and establishment of axilloinguinal pathway, then L breast moving fluid towards pathways spending extra time in any areas of fibrosis then retracing all steps then L UE working proximal to distal with increased time spent at axilla and upper arm then retracing all steps.  MFR to area of cording with UE in to abduction with cording more difficult to palpate but definite areas of tightness  11/05/23: Manual Therapy: In supine: Short neck, 5 diaphragmatic breaths, R axillary nodes and establishment of interaxillary pathway, L inguinal nodes and establishment of axilloinguinal pathway, then L breast moving fluid towards pathways spending extra time in any areas of fibrosis then retracing all steps then L UE working proximal to distal with increased time spent at axilla and upper arm then retracing all steps.  MFR to area of cording with UE in to abduction with cording more difficult to palpate but definite areas of tightness  10/31/23: Manual Therapy: In supine: Short neck, superficial and deep abdominals, Rt axillary and pectoral nodes, anterior intact thorax sequence, establishment of anterior inter-axillary pathway, Lt inguinal nodes and establishment of Lt axillo-inguinal pathway, then L breast  moving fluid towards pathways spending extra time in any areas of fibrosis, then into Rt S/L for focus to fibrosis in lateral breast and found what felt like edges of a seroma near areolar, then finished retracing all steps in supine. Pt reports area that felt like seroma was where she had increased pain over the weekend to the point she took her compression bra off because she like her breast was trying to stretch out of it.  MFR to area of cording found at last session but hard to palpate cords today, also focused on area at Lt lateral trunk that pt reports felt like a cord over past few days and would feel better when she pushed on it. Found area of tightness that may or may not have been cord, but definitely tight regardless P/ROM to Lt shoulder into flex, abd and D2 with scap depression during for varying stretches for cording tightness  10/29/23: Manual Therapy: In supine: Short neck, 5 diaphragmatic breaths, R axillary nodes and establishment of interaxillary pathway, L inguinal nodes and establishment of axilloinguinal pathway, then L breast moving fluid towards pathways spending extra time in any areas of fibrosis then retracing all steps. MFR to numerous cords in  L axilla extending down to upper arm with L UE in 100 degrees of abduction 10/24/23: Manual Therapy: Instructed pt in each step of MLD and had her return demonstrate each step with verbal and tactile cues for speed, pressure, direction of stretch and skin stretch technique. Pt completed first half with cueing and therapist completed second half. MLD to Lt breast and upper arm in supine: Short neck, superficial and deep abdominals, Rt axillary and pectoral nodes and establishment of anterior inter-axillary pathway, Lt inguinal nodes and establishment of axillo-inguinal pathway, then Lt breast moving fluid towards pathways and L axilla spending extra time in any areas of fibrosis then LUE working proximal to distal and retracing all steps    10/21/23: Manual Therapy: MLD to Lt breast and upper arm in supine: Short neck, superficial and deep abdominals, Rt axillary and pectoral nodes and establishment of anterior inter-axillary pathway, Lt inguinal nodes and establishment of axillo-inguinal pathway, then Lt breast moving fluid towards pathways and L axilla spending extra time in any areas of fibrosis then LUE working proximal to distal and retracing all steps  STM when in Rt sidelying to Lt serratus and lateral trunk, also during P/ROM to same P/ROM to Lt shoulder into flex, abd and D2 with scapular depression by therapist throughout  10/17/23: Manual Therapy: MLD to Lt breast and upper arm in supine: Short neck, superficial and deep abdominals, Rt axillary and pectoral nodes and establishment of anterior inter-axillary pathway, Lt inguinal nodes and establishment of axillo-inguinal pathway, then Lt breast moving fluid towards pathways and L axilla spending extra time in any areas of fibrosis then LUE working proximal to distal and retracing all steps  STM when in Rt sidelying to Lt serratus and lateral trunk Issued instructions for UE MLD  10/15/23: Manual Therapy: MLD to Lt breast and upper arm in supine: Short neck, superficial and deep abdominals, Rt axillary and pectoral nodes and establishment of anterior inter-axillary pathway, Lt inguinal nodes and establishment of axillo-inguinal pathway, then Lt breast moving fluid towards pathways and L axilla spending extra time in any areas of fibrosis then LUE working proximal to distal and retracing all steps  STM when in Rt sidelying to Lt serratus Educated pt to wear her compression sleeve     The patient was assessed using the L-Dex machine today to produce a lymphedema index baseline score. The patient will be reassessed on a regular basis (typically every 3 months) to obtain new L-Dex scores. If the score is > 6.5 points away from his/her baseline score indicating onset of  subclinical lymphedema, it will be recommended to wear a compression garment for 4 weeks, 12 hours per day and then be reassessed. If the score continues to be > 6.5 points from baseline at reassessment, we will initiate lymphedema treatment. Assessing in this manner has a 95% rate of preventing clinically significant lymphedema.        PATIENT EDUCATION:  Wear compression bra 12 hrs/day for 4 weeks during day time hours Access Code: [email or web address] URL: [email or web address] Date: 08/06/2023 Prepared by: Grayce Sheldon  Exercises - Single Arm Doorway Pec Stretch at 90 Degrees Abduction  - 2 x daily - 7 x weekly - 1 sets - 3 reps - 20-30 hold - Supine Lower Trunk Rotation  - 1 x daily - 7 x weekly - 1 sets - 3 reps - 20-30 hold Education details: nerve desensitization, supine dowel exercises, lymphedema risk reduction and importance of skin care Person educated: Patient Education method: Explanation, Demonstration,  Tactile cues, and Handouts Education comprehension: verbalized understanding and returned demonstration  HOME EXERCISE PROGRAM: Reviewed previously given post op HEP. Supine dowel exercises in to abduction and flexion  ASSESSMENT:  CLINICAL IMPRESSION: Pt's discomfort was improved today. She was not having pain today which is an improvement from last session. Continued with MLD to L breast and STM to area of tightness at left lateral trunk.   Pt will benefit from skilled therapeutic intervention to improve on the following deficits: Decreased knowledge of precautions, impaired UE functional use, pain, decreased ROM, postural dysfunction, increased edema.   PT treatment/interventions: ADL/Self care home management, [phone]- PT Re-evaluation, 97110-Therapeutic exercises, 97530- Therapeutic activity, [id number]- Neuromuscular re-education, 97535- Self Care, [id number]- Manual therapy, [id number]- Orthotic Initial, and [id number]- Orthotic/Prosthetic subsequent   GOALS: Goals reviewed  with patient? Yes  LONG TERM GOALS:  (STG=LTG)  GOALS Name Target Date  Goal status  1 Pt will demonstrate she has regained full shoulder ROM and function post operatively compared to baselines.  Baseline: 08/22/23 MET  2 Pt will report no pain at end range of L shoulder abduction to allow improved function. 08/22/23 MET  3 Pt will be independent in a home exercise program for continued stretching and strengthening.  08/22/23 MET  4 Pt will be independent in self MLD for long term management of L breast lymphedema and swelling in L axilla to decrease risk of infection. 10/29/23 MET 10/29/23  5 Pt will report a 50% improvement in pain in L breast to allow improved comfort.  11/26/23 NEW  6 Pt will return to the green on the SOZO demonstrating reversal of subclinical lymphedema.  11/26/23 NEW  7 Pt will demonstrate a 75% improvement in pain and discomfort of L axilla from cording to allow improved comfort and function 11/26/23 NEW     PLAN:  PT FREQUENCY/DURATION: 2x/wk for 4 wks  PLAN FOR NEXT SESSION: MFR to cording in L axilla,  Continue MLD to LUE and L breast to reduce pain, what did dr say?  [facility]  [address], [address]  [place] [address]  [phone]  Self manual lymph drainage: Do circles behind the collar bones x 10  Perform this sequence once a day.  Only give enough pressure no your skin to make the skin move.  Diaphragmatic - Supine   Inhale through nose making navel move out toward hands. Exhale through puckered lips, hands follow navel in. Repeat _5__ times. Rest _10__ seconds between repeats.   Copyright  VHI. All rights reserved.  Hug yourself.  Do circles at your neck just above your collarbones.  Repeat this 10 times.  Axilla - One at a Time   Using full weight of flat hand and fingers at center of uninvolved (R) armpit, make _10__ in-place circles.   Copyright  VHI. All rights reserved.  LEG: Inguinal Nodes  Stimulation   With small finger side of hand against hip crease on involved (L) side, gently perform circles at the crease. Repeat __10_ times.   Copyright  VHI. All rights reserved.  Axilla to Inguinal Nodes - Sweep   On involved side, stretch skin from armpit along side of trunk to hip crease.  Now gently stretch skin from the involved side to the uninvolved side across the chest at the shoulder line.    Move fluid from L armpit in area that feels swollen towards these 2 pathways by gently stretching skin.   Finish by doing circles in R armpit and  L groin.   Finish by doing the pathways as described above going from your involved armpit to the same side groin and going across your upper chest from the involved shoulder to the uninvolved shoulder.  Repeat the steps above where you do circles in your left groin and right armpit. Copyright  VHI. All rights reserved.    Florina Sever Burnham, PT 11/21/2023, 4:58 PM

## 2023-11-23 ENCOUNTER — Other Ambulatory Visit: Payer: Self-pay | Admitting: Physician Assistant

## 2023-11-23 DIAGNOSIS — K219 Gastro-esophageal reflux disease without esophagitis: Secondary | ICD-10-CM

## 2023-11-26 ENCOUNTER — Encounter: Payer: Self-pay | Admitting: Physical Therapy

## 2023-11-26 ENCOUNTER — Ambulatory Visit: Payer: Self-pay | Admitting: Physical Therapy

## 2023-11-26 DIAGNOSIS — C50412 Malignant neoplasm of upper-outer quadrant of left female breast: Secondary | ICD-10-CM

## 2023-11-26 DIAGNOSIS — I890 Lymphedema, not elsewhere classified: Secondary | ICD-10-CM

## 2023-11-26 DIAGNOSIS — M25612 Stiffness of left shoulder, not elsewhere classified: Secondary | ICD-10-CM

## 2023-11-26 DIAGNOSIS — Z483 Aftercare following surgery for neoplasm: Secondary | ICD-10-CM

## 2023-11-26 DIAGNOSIS — R293 Abnormal posture: Secondary | ICD-10-CM

## 2023-11-26 NOTE — Therapy (Signed)
 OUTPATIENT PHYSICAL THERAPY BREAST CANCER TREATMENT   Patient Name: Norma [name] MRN: [id number] DOB:May 04, 1970, 53 y.o., female Today's Date: 11/26/2023  END OF SESSION:  PT End of Session - 11/26/23 1605     Visit Number 23    Number of Visits 23    Date for PT Re-Evaluation 11/26/23    PT Start Time 1603    PT Stop Time 1655    PT Time Calculation (min) 52 min    Activity Tolerance Patient tolerated treatment well    Behavior During Therapy WFL for tasks assessed/performed            Past Medical History:  Diagnosis Date   Allergy    Anemia    hx of   Anxiety    Arthritis    KNEES   Asthma    treated for during COVID tx   Cancer (HCC) 06/2023   left breast IDC   Chronic headache    Colon polyps    TUBULAR ADENOMAS AND HYPERPLASTIC    Complication of anesthesia    DDD (degenerative disc disease), thoracic    Depression    Diabetes mellitus, type 2 (HCC)    Difficult airway for intubation    Fibromyalgia    GERD (gastroesophageal reflux disease)    Goiter    Hyperlipidemia    Hypertension    Low back pain    Obesity    PONV (postoperative nausea and vomiting)    RA (rheumatoid arthritis) (HCC)    Seasonal allergies    Vitamin D  deficiency    Past Surgical History:  Procedure Laterality Date   ABDOMINAL HYSTERECTOMY  2008   BACK SURGERY  2005   lumbar laminectomy   BALLOON DILATION N/A 06/01/2021   Procedure: BALLOON DILATION;  Surgeon: Albertus Gordy HERO, MD;  Location: [facility];  Service: Gastroenterology;  Laterality: N/A;   BARTHOLIN GLAND CYST EXCISION     BIOPSY  06/01/2021   Procedure: BIOPSY;  Surgeon: Albertus Gordy HERO, MD;  Location: [facility];  Service: Gastroenterology;;   BREAST LUMPECTOMY WITH RADIOACTIVE SEED AND SENTINEL LYMPH NODE BIOPSY Left 07/02/2023   Procedure: BREAST LUMPECTOMY WITH RADIOACTIVE SEED AND SENTINEL LYMPH NODE BIOPSY;  Surgeon: Aron Shoulders, MD;  Location: [facility];  Service: General;   Laterality: Left;  90 MIN 78444- EXCISION LEFT CHEST WALL MASS GEN COMBINED WITH REGIONAL   COLONOSCOPY  2017   JMP-MAC-prep good-TA -recall 5 yr   COLONOSCOPY WITH PROPOFOL  N/A 06/01/2021   Procedure: COLONOSCOPY WITH PROPOFOL ;  Surgeon: Albertus Gordy HERO, MD;  Location: [facility];  Service: Gastroenterology;  Laterality: N/A;   ESOPHAGOGASTRODUODENOSCOPY (EGD) WITH PROPOFOL  N/A 06/01/2021   Procedure: ESOPHAGOGASTRODUODENOSCOPY (EGD) WITH PROPOFOL ;  Surgeon: Albertus Gordy HERO, MD;  Location: [facility];  Service: Gastroenterology;  Laterality: N/A;   LUMBAR LAMINECTOMY/DECOMPRESSION MICRODISCECTOMY Right 07/17/2013   Procedure: LUMBAR LAMINECTOMY/DECOMPRESSION MICRODISCECTOMY 1 LEVEL,RIGHT LUMBAR FOUR-FIVE;  Surgeon: Darina MALVA Boehringer, MD;  Location: [facility];  Service: Neurosurgery;  Laterality: Right;  Right   MASS EXCISION Left 07/02/2023   Procedure: EXCISION, MASS, CHEST WALL;  Surgeon: Aron Shoulders, MD;  Location: [facility];  Service: General;  Laterality: Left;   PARTIAL HYSTERECTOMY     POLYPECTOMY  2017   TA and benign polypoid   POLYPECTOMY  06/01/2021   Procedure: POLYPECTOMY;  Surgeon: Albertus Gordy HERO, MD;  Location: [facility];  Service: Gastroenterology;;   WISDOM TOOTH EXTRACTION     Patient Active Problem List  Diagnosis Date Noted   Genetic testing 06/23/2023   Malignant neoplasm of upper-outer quadrant of left breast in female, estrogen receptor positive (HCC) 06/11/2023   Gastritis without bleeding    Esophageal dysphagia    Schatzki's ring    Benign neoplasm of ascending colon    Class 3 severe obesity with serious comorbidity and body mass index (BMI) of 40.0 to 44.9 in adult 08/25/2019   Type 2 diabetes mellitus without complication, without long-term current use of insulin (HCC) 08/25/2019   Chronic cough 06/27/2017   LUQ abdominal pain 12/07/2014   RUQ abdominal pain 11/10/2013   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   Gastroesophageal reflux  disease 12/18/2012   Hx of adenomatous colonic polyps 12/18/2012   Chronic RLQ pain 12/18/2012   IBS (irritable bowel syndrome) 01/22/2011   HTN (hypertension) 01/22/2011   Hyperlipidemia 01/22/2011   Anxiety and depression 01/22/2011    PCP: Haze Servant, NP  REFERRING PROVIDER: Dr. Jina Nephew   REFERRING DIAG: Left breast cancer  THERAPY DIAG:  Lymphedema, not elsewhere classified  Stiffness of left shoulder, not elsewhere classified  Aftercare following surgery for neoplasm  Abnormal posture  Malignant neoplasm of upper-outer quadrant of left breast in female, estrogen receptor positive (HCC)  Rationale for Evaluation and Treatment: Rehabilitation  ONSET DATE: 04/26/23  SUBJECTIVE:                                                                                                                                                                                           SUBJECTIVE STATEMENT: I was sore again over the weekend. It has been very tender to touch. I can not tell that it has been red. I worked on myself Fri and Saturday but I did not mess with it on Sunday and Monday. I don't understand what is going on with this breast.   PERTINENT HISTORY:  Patient was diagnosed on 04/26/2023 with left grade 3 invasive ductal carcinoma breast cancer. It measures 1 cm and is located in the upper outer quadrant. It is ER/PR positive and HER2 negative with a Ki67 of 30%. Pt underwent a L lumpectomy and SLNB 0/2 on 07/02/23  PATIENT GOALS:  Reassess how my recovery is going related to arm function, pain, and swelling.  PAIN:  Are you having pain?  6/10, L breast  PRECAUTIONS: Recent Surgery, left UE Lymphedema risk,   RED FLAGS: None   ACTIVITY LEVEL / LEISURE: doing post op exercises   OBJECTIVE:   PATIENT SURVEYS:  QUICK DASH:     OBSERVATIONS: Healing scars with some increased scar tissue at L axilla  POSTURE:  Forward head and rounded  shoulders posture    UPPER EXTREMITY AROM/PROM:   A/PROM RIGHT   eval    Shoulder extension 48  Shoulder flexion 153  Shoulder abduction 165  Shoulder internal rotation 63  Shoulder external rotation 84                          (Blank rows = not tested)   A/PROM LEFT   eval LEFT  07/25/23 LEFT 08/08/23 LEFT 10/01/23  Shoulder extension 35 70    Shoulder flexion 150 162 171 176  Shoulder abduction 167 156 151 163  Shoulder internal rotation 63 69    Shoulder external rotation 75 80                            (Blank rows = not tested)   CERVICAL AROM: All within normal limits   UPPER EXTREMITY STRENGTH: WFL   LYMPHEDEMA ASSESSMENTS (in cm):    LANDMARK RIGHT   eval  10 cm proximal to olecranon process 38.8  Olecranon process 28.7  10 cm proximal to ulnar styloid process 25.8  Just proximal to ulnar styloid process 17  Across hand at thumb web space 20.5  At base of 2nd digit 5.9  (Blank rows = not tested)   LANDMARK LEFT   eval LEFT 07/25/23  10 cm proximal to olecranon process 40 38.4  Olecranon process 28.8 29.5  10 cm proximal to ulnar styloid process 24.3 23  Just proximal to ulnar styloid process 16.3 16.5  Across hand at thumb web space 19.5 20  At base of 2nd digit 5.9 5.8  (Blank rows = not tested)  Surgery type/Date: 07/02/23 L breast lumpectomy and SLNB Number of lymph nodes removed: 0/2 Current/past treatment (chemo, radiation, hormone therapy): does not need chemo, will require radiation, will need hormone therapy Other symptoms:  Heaviness/tightness Yes Pain Yes Pitting edema No Infections No Decreased scar mobility Yes Stemmer sign No     TREATMENT PERFORMED: 11/26/23: Manual Therapy: In supine: Short neck, 5 diaphragmatic breaths, R axillary nodes and establishment of interaxillary pathway, L inguinal nodes and establishment of axilloinguinal pathway, then L breast moving fluid towards pathways spending extra time in any areas of fibrosis at lateral breast  then retracing all steps  STM In R sidelying to L serratus and lateral breast in area of tightness/fibrosis with pt reporting increased tenderness today then back to supine to complete MLD  11/21/23: Manual Therapy: In supine: Short neck, 5 diaphragmatic breaths, R axillary nodes and establishment of interaxillary pathway, L inguinal nodes and establishment of axilloinguinal pathway, then L breast moving fluid towards pathways spending extra time in any areas of fibrosis then retracing all steps then L UE working proximal to distal with increased time spent at axilla and upper arm then retracing all steps.  MFR to L axilla STM using cocoa butter and WAVE tool In R sidelying to L serratus and lateral breast in area of tightness/fibrosis with good improvement noted by end of session  11/19/23: Manual Therapy: In supine: Short neck, 5 diaphragmatic breaths, R axillary nodes and establishment of interaxillary pathway, L inguinal nodes and establishment of axilloinguinal pathway, then L breast moving fluid towards pathways spending extra time in any areas of fibrosis then retracing all steps then L UE working proximal to distal with increased time spent at axilla and upper arm then retracing all steps.  STM using cocoa butter In R sidelying  to L serratus and lateral breast in area of tightness/fibrosis with good improvement noted by end of session  11/14/23: Manual Therapy: In supine: Short neck, 5 diaphragmatic breaths, R axillary nodes and establishment of interaxillary pathway, L inguinal nodes and establishment of axilloinguinal pathway, then L breast moving fluid towards pathways spending extra time in any areas of fibrosis then retracing all steps then L UE working proximal to distal with increased time spent at axilla and upper arm then retracing all steps.  STM using cocoa butter In R sidelying to L serratus and lateral breast in area of tightness/fibrosis with good improvement noted by end of  session    The patient was assessed using the L-Dex machine today to produce a lymphedema index baseline score. The patient will be reassessed on a regular basis (typically every 3 months) to obtain new L-Dex scores. If the score is > 6.5 points away from his/her baseline score indicating onset of subclinical lymphedema, it will be recommended to wear a compression garment for 4 weeks, 12 hours per day and then be reassessed. If the score continues to be > 6.5 points from baseline at reassessment, we will initiate lymphedema treatment. Assessing in this manner has a 95% rate of preventing clinically significant lymphedema.   11/12/23: Manual Therapy: In supine: Short neck, 5 diaphragmatic breaths, R axillary nodes and establishment of interaxillary pathway, L inguinal nodes and establishment of axilloinguinal pathway, then L breast moving fluid towards pathways spending extra time in any areas of fibrosis then retracing all steps then L UE working proximal to distal with increased time spent at axilla and upper arm then retracing all steps.  MFR to area of cording with UE in to abduction with cording more difficult to palpate but definite areas of tightness In R sidelying to L serratus and lateral breast in area of tightness/fibrosis  11/07/23: Manual Therapy: In supine: Short neck, 5 diaphragmatic breaths, R axillary nodes and establishment of interaxillary pathway, L inguinal nodes and establishment of axilloinguinal pathway, then L breast moving fluid towards pathways spending extra time in any areas of fibrosis then retracing all steps then L UE working proximal to distal with increased time spent at axilla and upper arm then retracing all steps.  MFR to area of cording with UE in to abduction with cording more difficult to palpate but definite areas of tightness  11/05/23: Manual Therapy: In supine: Short neck, 5 diaphragmatic breaths, R axillary nodes and establishment of interaxillary pathway,  L inguinal nodes and establishment of axilloinguinal pathway, then L breast moving fluid towards pathways spending extra time in any areas of fibrosis then retracing all steps then L UE working proximal to distal with increased time spent at axilla and upper arm then retracing all steps.  MFR to area of cording with UE in to abduction with cording more difficult to palpate but definite areas of tightness  10/31/23: Manual Therapy: In supine: Short neck, superficial and deep abdominals, Rt axillary and pectoral nodes, anterior intact thorax sequence, establishment of anterior inter-axillary pathway, Lt inguinal nodes and establishment of Lt axillo-inguinal pathway, then L breast moving fluid towards pathways spending extra time in any areas of fibrosis, then into Rt S/L for focus to fibrosis in lateral breast and found what felt like edges of a seroma near areolar, then finished retracing all steps in supine. Pt reports area that felt like seroma was where she had increased pain over the weekend to the point she took her compression bra off because  she like her breast was trying to stretch out of it.  MFR to area of cording found at last session but hard to palpate cords today, also focused on area at Lt lateral trunk that pt reports felt like a cord over past few days and would feel better when she pushed on it. Found area of tightness that may or may not have been cord, but definitely tight regardless P/ROM to Lt shoulder into flex, abd and D2 with scap depression during for varying stretches for cording tightness  10/29/23: Manual Therapy: In supine: Short neck, 5 diaphragmatic breaths, R axillary nodes and establishment of interaxillary pathway, L inguinal nodes and establishment of axilloinguinal pathway, then L breast moving fluid towards pathways spending extra time in any areas of fibrosis then retracing all steps. MFR to numerous cords in L axilla extending down to upper arm with L UE in 100  degrees of abduction 10/24/23: Manual Therapy: Instructed pt in each step of MLD and had her return demonstrate each step with verbal and tactile cues for speed, pressure, direction of stretch and skin stretch technique. Pt completed first half with cueing and therapist completed second half. MLD to Lt breast and upper arm in supine: Short neck, superficial and deep abdominals, Rt axillary and pectoral nodes and establishment of anterior inter-axillary pathway, Lt inguinal nodes and establishment of axillo-inguinal pathway, then Lt breast moving fluid towards pathways and L axilla spending extra time in any areas of fibrosis then LUE working proximal to distal and retracing all steps   10/21/23: Manual Therapy: MLD to Lt breast and upper arm in supine: Short neck, superficial and deep abdominals, Rt axillary and pectoral nodes and establishment of anterior inter-axillary pathway, Lt inguinal nodes and establishment of axillo-inguinal pathway, then Lt breast moving fluid towards pathways and L axilla spending extra time in any areas of fibrosis then LUE working proximal to distal and retracing all steps  STM when in Rt sidelying to Lt serratus and lateral trunk, also during P/ROM to same P/ROM to Lt shoulder into flex, abd and D2 with scapular depression by therapist throughout  10/17/23: Manual Therapy: MLD to Lt breast and upper arm in supine: Short neck, superficial and deep abdominals, Rt axillary and pectoral nodes and establishment of anterior inter-axillary pathway, Lt inguinal nodes and establishment of axillo-inguinal pathway, then Lt breast moving fluid towards pathways and L axilla spending extra time in any areas of fibrosis then LUE working proximal to distal and retracing all steps  STM when in Rt sidelying to Lt serratus and lateral trunk Issued instructions for UE MLD  10/15/23: Manual Therapy: MLD to Lt breast and upper arm in supine: Short neck, superficial and deep abdominals, Rt  axillary and pectoral nodes and establishment of anterior inter-axillary pathway, Lt inguinal nodes and establishment of axillo-inguinal pathway, then Lt breast moving fluid towards pathways and L axilla spending extra time in any areas of fibrosis then LUE working proximal to distal and retracing all steps  STM when in Rt sidelying to Lt serratus Educated pt to wear her compression sleeve     The patient was assessed using the L-Dex machine today to produce a lymphedema index baseline score. The patient will be reassessed on a regular basis (typically every 3 months) to obtain new L-Dex scores. If the score is > 6.5 points away from his/her baseline score indicating onset of subclinical lymphedema, it will be recommended to wear a compression garment for 4 weeks, 12 hours per day and then  be reassessed. If the score continues to be > 6.5 points from baseline at reassessment, we will initiate lymphedema treatment. Assessing in this manner has a 95% rate of preventing clinically significant lymphedema.        PATIENT EDUCATION:  Wear compression bra 12 hrs/day for 4 weeks during day time hours Access Code: [email or web address] URL: [email or web address] Date: 08/06/2023 Prepared by: Grayce Sheldon  Exercises - Single Arm Doorway Pec Stretch at 90 Degrees Abduction  - 2 x daily - 7 x weekly - 1 sets - 3 reps - 20-30 hold - Supine Lower Trunk Rotation  - 1 x daily - 7 x weekly - 1 sets - 3 reps - 20-30 hold Education details: nerve desensitization, supine dowel exercises, lymphedema risk reduction and importance of skin care Person educated: Patient Education method: Explanation, Demonstration, Tactile cues, and Handouts Education comprehension: verbalized understanding and returned demonstration  HOME EXERCISE PROGRAM: Reviewed previously given post op HEP. Supine dowel exercises in to abduction and flexion  ASSESSMENT:  CLINICAL IMPRESSION: Pt reports her discomfort returned over  the weekend. She has increased swelling in her L breast especially. She will see the doctor on the 29th and will discuss this with her. She has improvements with her pain but then it returns. There is still increased fibrosis in inferior/lateral breast despite doing self MLD and wearing her compression bra. Will place pt on hold until she sees the doctor. She has met all goals for therapy except for her breast pain goal and this varies session to session.   Pt will benefit from skilled therapeutic intervention to improve on the following deficits: Decreased knowledge of precautions, impaired UE functional use, pain, decreased ROM, postural dysfunction, increased edema.   PT treatment/interventions: ADL/Self care home management, [phone]- PT Re-evaluation, 97110-Therapeutic exercises, 97530- Therapeutic activity, [id number]- Neuromuscular re-education, 97535- Self Care, 02859- Manual therapy, [id number]- Orthotic Initial, and [id number]- Orthotic/Prosthetic subsequent   GOALS: Goals reviewed with patient? Yes  LONG TERM GOALS:  (STG=LTG)  GOALS Name Target Date  Goal status  1 Pt will demonstrate she has regained full shoulder ROM and function post operatively compared to baselines.  Baseline: 08/22/23 MET  2 Pt will report no pain at end range of L shoulder abduction to allow improved function. 08/22/23 MET  3 Pt will be independent in a home exercise program for continued stretching and strengthening.  08/22/23 MET  4 Pt will be independent in self MLD for long term management of L breast lymphedema and swelling in L axilla to decrease risk of infection. 10/29/23 MET 10/29/23  5 Pt will report a 50% improvement in pain in L breast to allow improved comfort.  11/26/23 11/26/23- ONGOING Varies - some days the pain is much better but then worsens   6 Pt will return to the green on the SOZO demonstrating reversal of subclinical lymphedema.  11/26/23 MET 11/14/23 returned to green  7 Pt will demonstrate a 75% improvement in  pain and discomfort of L axilla from cording to allow improved comfort and function 11/26/23 MET 11/26/23     PLAN:  PT FREQUENCY/DURATION: 2x/wk for 4 wks  PLAN FOR NEXT SESSION: MFR to cording in L axilla,  Continue MLD to LUE and L breast to reduce pain, what did dr say?  [facility]  [address], [address]  [place] [address]  [phone]  Self manual lymph drainage: Do circles behind the collar bones x 10  Perform this sequence once a day.  Only give  enough pressure no your skin to make the skin move.  Diaphragmatic - Supine   Inhale through nose making navel move out toward hands. Exhale through puckered lips, hands follow navel in. Repeat _5__ times. Rest _10__ seconds between repeats.   Copyright  VHI. All rights reserved.  Hug yourself.  Do circles at your neck just above your collarbones.  Repeat this 10 times.  Axilla - One at a Time   Using full weight of flat hand and fingers at center of uninvolved (R) armpit, make _10__ in-place circles.   Copyright  VHI. All rights reserved.  LEG: Inguinal Nodes Stimulation   With small finger side of hand against hip crease on involved (L) side, gently perform circles at the crease. Repeat __10_ times.   Copyright  VHI. All rights reserved.  Axilla to Inguinal Nodes - Sweep   On involved side, stretch skin from armpit along side of trunk to hip crease.  Now gently stretch skin from the involved side to the uninvolved side across the chest at the shoulder line.    Move fluid from L armpit in area that feels swollen towards these 2 pathways by gently stretching skin.   Finish by doing circles in R armpit and L groin.   Finish by doing the pathways as described above going from your involved armpit to the same side groin and going across your upper chest from the involved shoulder to the uninvolved shoulder.  Repeat the steps above where you do circles in your left groin and right  armpit. Copyright  VHI. All rights reserved.    [facility], PT 11/26/2023, 5:01 PM

## 2023-11-28 ENCOUNTER — Encounter: Payer: Self-pay | Admitting: Physical Therapy

## 2023-12-11 ENCOUNTER — Ambulatory Visit: Attending: General Surgery | Admitting: Physical Therapy

## 2023-12-11 ENCOUNTER — Encounter: Payer: Self-pay | Admitting: Physical Therapy

## 2023-12-11 DIAGNOSIS — Z483 Aftercare following surgery for neoplasm: Secondary | ICD-10-CM | POA: Diagnosis present

## 2023-12-11 DIAGNOSIS — M25612 Stiffness of left shoulder, not elsewhere classified: Secondary | ICD-10-CM | POA: Diagnosis present

## 2023-12-11 DIAGNOSIS — R293 Abnormal posture: Secondary | ICD-10-CM | POA: Insufficient documentation

## 2023-12-11 DIAGNOSIS — C50412 Malignant neoplasm of upper-outer quadrant of left female breast: Secondary | ICD-10-CM | POA: Insufficient documentation

## 2023-12-11 DIAGNOSIS — Z170 Estrogen receptor positive status [ER+]: Secondary | ICD-10-CM | POA: Insufficient documentation

## 2023-12-11 DIAGNOSIS — I890 Lymphedema, not elsewhere classified: Secondary | ICD-10-CM | POA: Diagnosis present

## 2023-12-11 NOTE — Therapy (Signed)
 OUTPATIENT PHYSICAL THERAPY BREAST CANCER TREATMENT   Patient Name: Norma [name] MRN: [id number] DOB:[birth year], 53 y.o., female Today's Date: 12/11/2023  END OF SESSION:  PT End of Session - 12/11/23 0820     Visit Number 24    Number of Visits 32    Date for Recertification  01/08/24    PT Start Time 0802    PT Stop Time 0900    PT Time Calculation (min) 58 min    Activity Tolerance Patient tolerated treatment well    Behavior During Therapy [facility] for tasks assessed/performed             Past Medical History:  Diagnosis Date   Allergy    Anemia    hx of   Anxiety    Arthritis    KNEES   Asthma    treated for during COVID tx   Cancer (HCC) 06/2023   left breast IDC   Chronic headache    Colon polyps    TUBULAR ADENOMAS AND HYPERPLASTIC    Complication of anesthesia    DDD (degenerative disc disease), thoracic    Depression    Diabetes mellitus, type 2 (HCC)    Difficult airway for intubation    Fibromyalgia    GERD (gastroesophageal reflux disease)    Goiter    Hyperlipidemia    Hypertension    Low back pain    Obesity    PONV (postoperative nausea and vomiting)    RA (rheumatoid arthritis) (HCC)    Seasonal allergies    Vitamin D  deficiency    Past Surgical History:  Procedure Laterality Date   ABDOMINAL HYSTERECTOMY  2008   BACK SURGERY  2005   lumbar laminectomy   BALLOON DILATION N/A 06/01/2021   Procedure: BALLOON DILATION;  Surgeon: Albertus Gordy HERO, MD;  Location: [facility];  Service: Gastroenterology;  Laterality: N/A;   BARTHOLIN GLAND CYST EXCISION     BIOPSY  06/01/2021   Procedure: BIOPSY;  Surgeon: Albertus Gordy HERO, MD;  Location: [facility];  Service: Gastroenterology;;   BREAST LUMPECTOMY WITH RADIOACTIVE SEED AND SENTINEL LYMPH NODE BIOPSY Left 07/02/2023   Procedure: BREAST LUMPECTOMY WITH RADIOACTIVE SEED AND SENTINEL LYMPH NODE BIOPSY;  Surgeon: Aron Shoulders, MD;  Location: [facility];  Service: General;   Laterality: Left;  90 MIN 78444- EXCISION LEFT CHEST WALL MASS GEN COMBINED WITH REGIONAL   COLONOSCOPY  2017   JMP-MAC-prep good-TA -recall 5 yr   COLONOSCOPY WITH PROPOFOL  N/A 06/01/2021   Procedure: COLONOSCOPY WITH PROPOFOL ;  Surgeon: Albertus Gordy HERO, MD;  Location: [facility];  Service: Gastroenterology;  Laterality: N/A;   ESOPHAGOGASTRODUODENOSCOPY (EGD) WITH PROPOFOL  N/A 06/01/2021   Procedure: ESOPHAGOGASTRODUODENOSCOPY (EGD) WITH PROPOFOL ;  Surgeon: Albertus Gordy HERO, MD;  Location: [facility];  Service: Gastroenterology;  Laterality: N/A;   LUMBAR LAMINECTOMY/DECOMPRESSION MICRODISCECTOMY Right 07/17/2013   Procedure: LUMBAR LAMINECTOMY/DECOMPRESSION MICRODISCECTOMY 1 LEVEL,RIGHT LUMBAR FOUR-FIVE;  Surgeon: Darina MALVA Boehringer, MD;  Location: [facility];  Service: Neurosurgery;  Laterality: Right;  Right   MASS EXCISION Left 07/02/2023   Procedure: EXCISION, MASS, CHEST WALL;  Surgeon: Aron Shoulders, MD;  Location: [facility];  Service: General;  Laterality: Left;   PARTIAL HYSTERECTOMY     POLYPECTOMY  2017   TA and benign polypoid   POLYPECTOMY  06/01/2021   Procedure: POLYPECTOMY;  Surgeon: Albertus Gordy HERO, MD;  Location: [facility];  Service: Gastroenterology;;   WISDOM TOOTH EXTRACTION     Patient Active Problem List  Diagnosis Date Noted   Genetic testing 06/23/2023   Malignant neoplasm of upper-outer quadrant of left breast in female, estrogen receptor positive (HCC) 06/11/2023   Gastritis without bleeding    Esophageal dysphagia    Schatzki's ring    Benign neoplasm of ascending colon    Class 3 severe obesity with serious comorbidity and body mass index (BMI) of 40.0 to 44.9 in adult (HCC) 08/25/2019   Type 2 diabetes mellitus without complication, without long-term current use of insulin (HCC) 08/25/2019   Chronic cough 06/27/2017   LUQ abdominal pain 12/07/2014   RUQ abdominal pain 11/10/2013   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   Gastroesophageal  reflux disease 12/18/2012   Hx of adenomatous colonic polyps 12/18/2012   Chronic RLQ pain 12/18/2012   IBS (irritable bowel syndrome) 01/22/2011   HTN (hypertension) 01/22/2011   Hyperlipidemia 01/22/2011   Anxiety and depression 01/22/2011    PCP: Haze Servant, NP  REFERRING PROVIDER: Dr. Jina Nephew   REFERRING DIAG: Left breast cancer  THERAPY DIAG:  Lymphedema, not elsewhere classified - Plan: PT plan of care cert/re-cert  Stiffness of left shoulder, not elsewhere classified - Plan: PT plan of care cert/re-cert  Aftercare following surgery for neoplasm - Plan: PT plan of care cert/re-cert  Abnormal posture - Plan: PT plan of care cert/re-cert  Malignant neoplasm of upper-outer quadrant of left breast in female, estrogen receptor positive (HCC) - Plan: PT plan of care cert/re-cert  Rationale for Evaluation and Treatment: Rehabilitation  ONSET DATE: 04/26/23  SUBJECTIVE:                                                                                                                                                                                           SUBJECTIVE STATEMENT: I had fluid removed from the breast twice since I was here last. I have been wearing my compression bra but I am still having the shooting pains.   PERTINENT HISTORY:  Patient was diagnosed on 04/26/2023 with left grade 3 invasive ductal carcinoma breast cancer. It measures 1 cm and is located in the upper outer quadrant. It is ER/PR positive and HER2 negative with a Ki67 of 30%. Pt underwent a L lumpectomy and SLNB 0/2 on 07/02/23  PATIENT GOALS:  Reassess how my recovery is going related to arm function, pain, and swelling.  PAIN:  Are you having pain?  3/10, L breast  PRECAUTIONS: Recent Surgery, left UE Lymphedema risk,   RED FLAGS: None   ACTIVITY LEVEL / LEISURE: doing post op exercises   OBJECTIVE:   PATIENT SURVEYS:  QUICK DASH:     OBSERVATIONS: Healing scars with some  increased scar tissue at L axilla  POSTURE:  Forward head and rounded shoulders posture   UPPER EXTREMITY AROM/PROM:   A/PROM RIGHT   eval    Shoulder extension 48  Shoulder flexion 153  Shoulder abduction 165  Shoulder internal rotation 63  Shoulder external rotation 84                          (Blank rows = not tested)   A/PROM LEFT   eval LEFT  07/25/23 LEFT 08/08/23 LEFT 10/01/23  Shoulder extension 35 70    Shoulder flexion 150 162 171 176  Shoulder abduction 167 156 151 163  Shoulder internal rotation 63 69    Shoulder external rotation 75 80                            (Blank rows = not tested)   CERVICAL AROM: All within normal limits   UPPER EXTREMITY STRENGTH: WFL   LYMPHEDEMA ASSESSMENTS (in cm):    LANDMARK RIGHT   eval  10 cm proximal to olecranon process 38.8  Olecranon process 28.7  10 cm proximal to ulnar styloid process 25.8  Just proximal to ulnar styloid process 17  Across hand at thumb web space 20.5  At base of 2nd digit 5.9  (Blank rows = not tested)   LANDMARK LEFT   eval LEFT 07/25/23  10 cm proximal to olecranon process 40 38.4  Olecranon process 28.8 29.5  10 cm proximal to ulnar styloid process 24.3 23  Just proximal to ulnar styloid process 16.3 16.5  Across hand at thumb web space 19.5 20  At base of 2nd digit 5.9 5.8  (Blank rows = not tested)  Surgery type/Date: 07/02/23 L breast lumpectomy and SLNB Number of lymph nodes removed: 0/2 Current/past treatment (chemo, radiation, hormone therapy): does not need chemo, will require radiation, will need hormone therapy Other symptoms:  Heaviness/tightness Yes Pain Yes Pitting edema No Infections No Decreased scar mobility Yes Stemmer sign No     TREATMENT PERFORMED: 12/11/23: Manual Therapy: Discussed how a seroma is different than lymphedema and the importnace of increasing compression to help the seroma heal Created foam chip pack and placed in thick stockinette for pt to  wear in her bra for additional compression STM to area of seroma and fibrosis just lateral to areola with pitting edema noted in this area MLD: In supine: Short neck, 5 diaphragmatic breaths, R axillary nodes and establishment of interaxillary pathway, L inguinal nodes and establishment of axilloinguinal pathway, then L breast moving fluid towards pathways spending extra time in any areas of fibrosis at lateral breast then retracing all steps  Issued info for pt to obtain a swell spot for her breast to help decrease lymphedema and heal seroma  11/26/23: Manual Therapy: In supine: Short neck, 5 diaphragmatic breaths, R axillary nodes and establishment of interaxillary pathway, L inguinal nodes and establishment of axilloinguinal pathway, then L breast moving fluid towards pathways spending extra time in any areas of fibrosis at lateral breast then retracing all steps  STM In R sidelying to L serratus and lateral breast in area of tightness/fibrosis with pt reporting increased tenderness today then back to supine to complete MLD  11/21/23: Manual Therapy: In supine: Short neck, 5 diaphragmatic breaths, R axillary nodes and establishment of interaxillary pathway, L inguinal nodes and establishment of axilloinguinal pathway, then L breast moving fluid towards pathways spending  extra time in any areas of fibrosis then retracing all steps then L UE working proximal to distal with increased time spent at axilla and upper arm then retracing all steps.  MFR to L axilla STM using cocoa butter and WAVE tool In R sidelying to L serratus and lateral breast in area of tightness/fibrosis with good improvement noted by end of session  11/19/23: Manual Therapy: In supine: Short neck, 5 diaphragmatic breaths, R axillary nodes and establishment of interaxillary pathway, L inguinal nodes and establishment of axilloinguinal pathway, then L breast moving fluid towards pathways spending extra time in any areas of fibrosis  then retracing all steps then L UE working proximal to distal with increased time spent at axilla and upper arm then retracing all steps.  STM using cocoa butter In R sidelying to L serratus and lateral breast in area of tightness/fibrosis with good improvement noted by end of session  11/14/23: Manual Therapy: In supine: Short neck, 5 diaphragmatic breaths, R axillary nodes and establishment of interaxillary pathway, L inguinal nodes and establishment of axilloinguinal pathway, then L breast moving fluid towards pathways spending extra time in any areas of fibrosis then retracing all steps then L UE working proximal to distal with increased time spent at axilla and upper arm then retracing all steps.  STM using cocoa butter In R sidelying to L serratus and lateral breast in area of tightness/fibrosis with good improvement noted by end of session    The patient was assessed using the L-Dex machine today to produce a lymphedema index baseline score. The patient will be reassessed on a regular basis (typically every 3 months) to obtain new L-Dex scores. If the score is > 6.5 points away from his/her baseline score indicating onset of subclinical lymphedema, it will be recommended to wear a compression garment for 4 weeks, 12 hours per day and then be reassessed. If the score continues to be > 6.5 points from baseline at reassessment, we will initiate lymphedema treatment. Assessing in this manner has a 95% rate of preventing clinically significant lymphedema.   11/12/23: Manual Therapy: In supine: Short neck, 5 diaphragmatic breaths, R axillary nodes and establishment of interaxillary pathway, L inguinal nodes and establishment of axilloinguinal pathway, then L breast moving fluid towards pathways spending extra time in any areas of fibrosis then retracing all steps then L UE working proximal to distal with increased time spent at axilla and upper arm then retracing all steps.  MFR to area of cording with  UE in to abduction with cording more difficult to palpate but definite areas of tightness In R sidelying to L serratus and lateral breast in area of tightness/fibrosis  11/07/23: Manual Therapy: In supine: Short neck, 5 diaphragmatic breaths, R axillary nodes and establishment of interaxillary pathway, L inguinal nodes and establishment of axilloinguinal pathway, then L breast moving fluid towards pathways spending extra time in any areas of fibrosis then retracing all steps then L UE working proximal to distal with increased time spent at axilla and upper arm then retracing all steps.  MFR to area of cording with UE in to abduction with cording more difficult to palpate but definite areas of tightness  11/05/23: Manual Therapy: In supine: Short neck, 5 diaphragmatic breaths, R axillary nodes and establishment of interaxillary pathway, L inguinal nodes and establishment of axilloinguinal pathway, then L breast moving fluid towards pathways spending extra time in any areas of fibrosis then retracing all steps then L UE working proximal to distal with increased  time spent at axilla and upper arm then retracing all steps.  MFR to area of cording with UE in to abduction with cording more difficult to palpate but definite areas of tightness  10/31/23: Manual Therapy: In supine: Short neck, superficial and deep abdominals, Rt axillary and pectoral nodes, anterior intact thorax sequence, establishment of anterior inter-axillary pathway, Lt inguinal nodes and establishment of Lt axillo-inguinal pathway, then L breast moving fluid towards pathways spending extra time in any areas of fibrosis, then into Rt S/L for focus to fibrosis in lateral breast and found what felt like edges of a seroma near areolar, then finished retracing all steps in supine. Pt reports area that felt like seroma was where she had increased pain over the weekend to the point she took her compression bra off because she like her breast was  trying to stretch out of it.  MFR to area of cording found at last session but hard to palpate cords today, also focused on area at Lt lateral trunk that pt reports felt like a cord over past few days and would feel better when she pushed on it. Found area of tightness that may or may not have been cord, but definitely tight regardless P/ROM to Lt shoulder into flex, abd and D2 with scap depression during for varying stretches for cording tightness  10/29/23: Manual Therapy: In supine: Short neck, 5 diaphragmatic breaths, R axillary nodes and establishment of interaxillary pathway, L inguinal nodes and establishment of axilloinguinal pathway, then L breast moving fluid towards pathways spending extra time in any areas of fibrosis then retracing all steps. MFR to numerous cords in L axilla extending down to upper arm with L UE in 100 degrees of abduction 10/24/23: Manual Therapy: Instructed pt in each step of MLD and had her return demonstrate each step with verbal and tactile cues for speed, pressure, direction of stretch and skin stretch technique. Pt completed first half with cueing and therapist completed second half. MLD to Lt breast and upper arm in supine: Short neck, superficial and deep abdominals, Rt axillary and pectoral nodes and establishment of anterior inter-axillary pathway, Lt inguinal nodes and establishment of axillo-inguinal pathway, then Lt breast moving fluid towards pathways and L axilla spending extra time in any areas of fibrosis then LUE working proximal to distal and retracing all steps   10/21/23: Manual Therapy: MLD to Lt breast and upper arm in supine: Short neck, superficial and deep abdominals, Rt axillary and pectoral nodes and establishment of anterior inter-axillary pathway, Lt inguinal nodes and establishment of axillo-inguinal pathway, then Lt breast moving fluid towards pathways and L axilla spending extra time in any areas of fibrosis then LUE working proximal to  distal and retracing all steps  STM when in Rt sidelying to Lt serratus and lateral trunk, also during P/ROM to same P/ROM to Lt shoulder into flex, abd and D2 with scapular depression by therapist throughout  10/17/23: Manual Therapy: MLD to Lt breast and upper arm in supine: Short neck, superficial and deep abdominals, Rt axillary and pectoral nodes and establishment of anterior inter-axillary pathway, Lt inguinal nodes and establishment of axillo-inguinal pathway, then Lt breast moving fluid towards pathways and L axilla spending extra time in any areas of fibrosis then LUE working proximal to distal and retracing all steps  STM when in Rt sidelying to Lt serratus and lateral trunk Issued instructions for UE MLD  10/15/23: Manual Therapy: MLD to Lt breast and upper arm in supine: Short neck, superficial and  deep abdominals, Rt axillary and pectoral nodes and establishment of anterior inter-axillary pathway, Lt inguinal nodes and establishment of axillo-inguinal pathway, then Lt breast moving fluid towards pathways and L axilla spending extra time in any areas of fibrosis then LUE working proximal to distal and retracing all steps  STM when in Rt sidelying to Lt serratus Educated pt to wear her compression sleeve    PATIENT EDUCATION:  Wear compression bra 12 hrs/day for 4 weeks during day time hours Access Code: [email or web address] URL: [email or web address] Date: 08/06/2023 Prepared by: Grayce Sheldon  Exercises - Single Arm Doorway Pec Stretch at 90 Degrees Abduction  - 2 x daily - 7 x weekly - 1 sets - 3 reps - 20-30 hold - Supine Lower Trunk Rotation  - 1 x daily - 7 x weekly - 1 sets - 3 reps - 20-30 hold Education details: nerve desensitization, supine dowel exercises, lymphedema risk reduction and importance of skin care Person educated: Patient Education method: Explanation, Demonstration, Tactile cues, and Handouts Education comprehension: verbalized understanding and  returned demonstration  HOME EXERCISE PROGRAM: Reviewed previously given post op HEP. Supine dowel exercises in to abduction and flexion  ASSESSMENT:  CLINICAL IMPRESSION: Pt returns to PT after having fluid drained twice since her last session from a seroma in her L breast. She has a seroma and lymphedema of her L breast. She had a trial of the FlexiTouch compression pump to assist with home management of her lymphedema. Added chip pack to her bra for additional compression and issued info on how to obtain a swell spot for additional compression. Spent increased time on soft tissue mobilization to area of fibrosis and seroma to help it reabsorb. Pt would benefit from additional skilled PT services to continue to reduce L breast lymphedema and to help with healing of seroma to decrease pain.   Pt will benefit from skilled therapeutic intervention to improve on the following deficits: Decreased knowledge of precautions, impaired UE functional use, pain, decreased ROM, postural dysfunction, increased edema.   PT treatment/interventions: ADL/Self care home management, [phone]- PT Re-evaluation, 97110-Therapeutic exercises, 97530- Therapeutic activity, [id number]- Neuromuscular re-education, 97535- Self Care, 02859- Manual therapy, [id number]- Orthotic Initial, and [id number]- Orthotic/Prosthetic subsequent   GOALS: Goals reviewed with patient? Yes  LONG TERM GOALS:  (STG=LTG)  GOALS Name Target Date  Goal status  1 Pt will demonstrate she has regained full shoulder ROM and function post operatively compared to baselines.  Baseline: 08/22/23 MET  2 Pt will report no pain at end range of L shoulder abduction to allow improved function. 08/22/23 MET  3 Pt will be independent in a home exercise program for continued stretching and strengthening.  08/22/23 MET  4 Pt will be independent in self MLD for long term management of L breast lymphedema and swelling in L axilla to decrease risk of infection. 10/29/23 MET  10/29/23  5 Pt will report a 50% improvement in pain in L breast to allow improved comfort.  11/26/23 11/26/23- ONGOING Varies - some days the pain is much better but then worsens   6 Pt will return to the green on the SOZO demonstrating reversal of subclinical lymphedema.  11/26/23 MET 11/14/23 returned to green  7 Pt will demonstrate a 75% improvement in pain and discomfort of L axilla from cording to allow improved comfort and function 11/26/23 MET 11/26/23  8 Pt will obtain a swell spot for additional compression in her compression bra. 01/08/24 NEW     PLAN:  PT FREQUENCY/DURATION: 2x/wk  for 4 wks  PLAN FOR NEXT SESSION: how was chip pack? Did she order swell spot? Cont MLD to L breast and STM to seroma  [facility]  [address], [address]  [place] [address]  [phone]  Self manual lymph drainage: Do circles behind the collar bones x 10  Perform this sequence once a day.  Only give enough pressure no your skin to make the skin move.  Diaphragmatic - Supine   Inhale through nose making navel move out toward hands. Exhale through puckered lips, hands follow navel in. Repeat _5__ times. Rest _10__ seconds between repeats.   Copyright  VHI. All rights reserved.  Hug yourself.  Do circles at your neck just above your collarbones.  Repeat this 10 times.  Axilla - One at a Time   Using full weight of flat hand and fingers at center of uninvolved (R) armpit, make _10__ in-place circles.   Copyright  VHI. All rights reserved.  LEG: Inguinal Nodes Stimulation   With small finger side of hand against hip crease on involved (L) side, gently perform circles at the crease. Repeat __10_ times.   Copyright  VHI. All rights reserved.  Axilla to Inguinal Nodes - Sweep   On involved side, stretch skin from armpit along side of trunk to hip crease.  Now gently stretch skin from the involved side to the uninvolved side across the chest at the shoulder line.     Move fluid from L armpit in area that feels swollen towards these 2 pathways by gently stretching skin.   Finish by doing circles in R armpit and L groin.   Finish by doing the pathways as described above going from your involved armpit to the same side groin and going across your upper chest from the involved shoulder to the uninvolved shoulder.  Repeat the steps above where you do circles in your left groin and right armpit. Copyright  VHI. All rights reserved.    [employer], PT 12/11/2023, 9:06 AM

## 2023-12-16 ENCOUNTER — Encounter: Payer: Self-pay | Admitting: Physical Therapy

## 2023-12-16 ENCOUNTER — Ambulatory Visit: Attending: Nurse Practitioner | Admitting: Nurse Practitioner

## 2023-12-16 ENCOUNTER — Encounter: Payer: Self-pay | Admitting: Nurse Practitioner

## 2023-12-16 ENCOUNTER — Ambulatory Visit: Payer: Self-pay | Admitting: Physical Therapy

## 2023-12-16 VITALS — BP 135/88 | HR 80 | Resp 19 | Ht 66.0 in | Wt 245.8 lb

## 2023-12-16 DIAGNOSIS — L600 Ingrowing nail: Secondary | ICD-10-CM

## 2023-12-16 DIAGNOSIS — E119 Type 2 diabetes mellitus without complications: Secondary | ICD-10-CM

## 2023-12-16 DIAGNOSIS — R7989 Other specified abnormal findings of blood chemistry: Secondary | ICD-10-CM

## 2023-12-16 DIAGNOSIS — Z7984 Long term (current) use of oral hypoglycemic drugs: Secondary | ICD-10-CM | POA: Diagnosis not present

## 2023-12-16 DIAGNOSIS — Z483 Aftercare following surgery for neoplasm: Secondary | ICD-10-CM

## 2023-12-16 DIAGNOSIS — I10 Essential (primary) hypertension: Secondary | ICD-10-CM | POA: Diagnosis not present

## 2023-12-16 DIAGNOSIS — I890 Lymphedema, not elsewhere classified: Secondary | ICD-10-CM | POA: Diagnosis not present

## 2023-12-16 DIAGNOSIS — M25612 Stiffness of left shoulder, not elsewhere classified: Secondary | ICD-10-CM

## 2023-12-16 DIAGNOSIS — R293 Abnormal posture: Secondary | ICD-10-CM

## 2023-12-16 DIAGNOSIS — C50412 Malignant neoplasm of upper-outer quadrant of left female breast: Secondary | ICD-10-CM

## 2023-12-16 LAB — POCT GLYCOSYLATED HEMOGLOBIN (HGB A1C): HbA1c, POC (controlled diabetic range): 6.8 % (ref 0.0–7.0)

## 2023-12-16 NOTE — Progress Notes (Signed)
 Assessment & Plan:  Norma [name] was seen today for referral.  Diagnoses and all orders for this visit:  Diabetes mellitus treated with oral medication (HCC) -     POCT glycosylated hemoglobin (Hb A1C) -     Ambulatory referral to Podiatry -     CMP14+EGFR  Ingrown toenail of both feet -     Ambulatory referral to Podiatry  Abnormal CBC -     CBC with Differential  Primary hypertension Continue all antihypertensives as prescribed.  Reminded to bring in blood pressure log for follow  up appointment.  RECOMMENDATIONS: DASH/Mediterranean Diets are healthier choices for HTN.     Patient has been counseled on age-appropriate routine health concerns for screening and prevention. These are reviewed and up-to-date. Referrals have been placed accordingly. Immunizations are up-to-date or declined.    Subjective:   Chief Complaint  Patient presents with   Referral    History of Present Illness Norma [name] is a 53 year old female with diabetes who presents with hypoglycemic episodes and medication management concerns.   DM 2 A1c at goal today She has been experiencing multiple hypoglycemic episodes, particularly at night, which have been waking her up. An episode on Friday night resulted in her blood sugar dropping to 47 mg/dL, despite having eaten a snack before bed. She managed the low blood sugar by consuming a candy bar at 5:30 AM. Her blood sugar levels have been fluctuating, with a 30-day average of 124 mg/dL and a recent three-day average of 106-107 mg/dL. She has been taking glimepiride  every morning except for this past Friday, Saturday, and Sunday, due to low blood sugar concerns. Her A1c has decreased today from 7.5% to 6.8%. Upon waking, her blood sugar tends to rise by 10-15 points after getting up and moving around, even without eating or drinking anything. She has experienced episodes of feeling disoriented and has had to consume glucose tablets or candy to manage her  blood sugar levels. A recent incident involved staggering and falling against a closet door due to low blood sugar, which was at 63 mg/dL at the time. She has been trying to manage her diet by snacking on approved foods and balancing carbohydrates with protein. Lab Results  Component Value Date   HGBA1C 6.8 12/16/2023     She discusses ongoing issues with inflammation and swelling following radiation for left Breast CA. She has been undergoing physical therapy and using a compression bra and swell spot to manage her symptoms. Her physical therapy was paused last month, but she continues to manage her condition with lifestyle changes and is awaiting further treatment options.  She mentions a podiatry issue, suspecting an ingrown toenail on both big toes, which have been partially managed by a pedicurist but requires further medical attention   Blood pressure is well controlled.  She is taking telmisartan  80 mg daily.  BP Readings from Last 3 Encounters:  12/16/23 135/88  10/07/23 (!) 138/55  09/10/23 137/84     Review of Systems  Constitutional:  Negative for fever, malaise/fatigue and weight loss.  HENT: Negative.  Negative for nosebleeds.   Eyes: Negative.  Negative for blurred vision, double vision and photophobia.  Respiratory: Negative.  Negative for cough and shortness of breath.   Cardiovascular: Negative.  Negative for chest pain, palpitations and leg swelling.  Gastrointestinal: Negative.  Negative for heartburn, nausea and vomiting.  Musculoskeletal: Negative.  Negative for myalgias.  Neurological: Negative.  Negative for dizziness, focal weakness, seizures and headaches.  Psychiatric/Behavioral: Negative.  Negative for suicidal ideas.     Past Medical History:  Diagnosis Date   Allergy    Anemia    hx of   Anxiety    Arthritis    KNEES   Asthma    treated for during COVID tx   Cancer (HCC) 06/2023   left breast IDC   Chronic headache    Colon polyps    TUBULAR  ADENOMAS AND HYPERPLASTIC    Complication of anesthesia    DDD (degenerative disc disease), thoracic    Depression    Diabetes mellitus, type 2 (HCC)    Difficult airway for intubation    Fibromyalgia    GERD (gastroesophageal reflux disease)    Goiter    Hyperlipidemia    Hypertension    Low back pain    Obesity    PONV (postoperative nausea and vomiting)    RA (rheumatoid arthritis) (HCC)    Seasonal allergies    Vitamin D  deficiency     Past Surgical History:  Procedure Laterality Date   ABDOMINAL HYSTERECTOMY  2008   BACK SURGERY  2005   lumbar laminectomy   BALLOON DILATION N/A 06/01/2021   Procedure: BALLOON DILATION;  Surgeon: Albertus Gordy HERO, MD;  Location: [facility];  Service: Gastroenterology;  Laterality: N/A;   BARTHOLIN GLAND CYST EXCISION     BIOPSY  06/01/2021   Procedure: BIOPSY;  Surgeon: Albertus Gordy HERO, MD;  Location: [facility];  Service: Gastroenterology;;   BREAST LUMPECTOMY WITH RADIOACTIVE SEED AND SENTINEL LYMPH NODE BIOPSY Left 07/02/2023   Procedure: BREAST LUMPECTOMY WITH RADIOACTIVE SEED AND SENTINEL LYMPH NODE BIOPSY;  Surgeon: Aron Shoulders, MD;  Location: [facility];  Service: General;  Laterality: Left;  90 MIN 78444- EXCISION LEFT CHEST WALL MASS GEN COMBINED WITH REGIONAL   COLONOSCOPY  2017   JMP-MAC-prep good-TA -recall 5 yr   COLONOSCOPY WITH PROPOFOL  N/A 06/01/2021   Procedure: COLONOSCOPY WITH PROPOFOL ;  Surgeon: Albertus Gordy HERO, MD;  Location: [facility];  Service: Gastroenterology;  Laterality: N/A;   ESOPHAGOGASTRODUODENOSCOPY (EGD) WITH PROPOFOL  N/A 06/01/2021   Procedure: ESOPHAGOGASTRODUODENOSCOPY (EGD) WITH PROPOFOL ;  Surgeon: Albertus Gordy HERO, MD;  Location: [facility];  Service: Gastroenterology;  Laterality: N/A;   LUMBAR LAMINECTOMY/DECOMPRESSION MICRODISCECTOMY Right 07/17/2013   Procedure: LUMBAR LAMINECTOMY/DECOMPRESSION MICRODISCECTOMY 1 LEVEL,RIGHT LUMBAR FOUR-FIVE;  Surgeon: Darina MALVA Boehringer, MD;  Location:  [facility];  Service: Neurosurgery;  Laterality: Right;  Right   MASS EXCISION Left 07/02/2023   Procedure: EXCISION, MASS, CHEST WALL;  Surgeon: Aron Shoulders, MD;  Location: [facility];  Service: General;  Laterality: Left;   PARTIAL HYSTERECTOMY     POLYPECTOMY  2017   TA and benign polypoid   POLYPECTOMY  06/01/2021   Procedure: POLYPECTOMY;  Surgeon: Albertus Gordy HERO, MD;  Location: [facility];  Service: Gastroenterology;;   WISDOM TOOTH EXTRACTION      Family History  Problem Relation Age of Onset   Diabetes Mother    Aneurysm Mother    Diabetes Father    Colon polyps Maternal Uncle    Liver disease Maternal Grandmother    Rheum arthritis Maternal Grandmother    Stroke Maternal Grandfather    Parkinson's disease Paternal Grandmother    Esophageal cancer Neg Hx    Colon cancer Neg Hx    Rectal cancer Neg Hx    Stomach cancer Neg Hx     Social History Reviewed with no changes to be made today.   Outpatient  Medications Prior to Visit  Medication Sig Dispense Refill   anastrozole  (ARIMIDEX ) 1 MG tablet Take 1 tablet (1 mg total) by mouth daily. 90 tablet 3   aspirin 81 MG chewable tablet Chew by mouth daily.     Azelastine -Fluticasone  137-50 MCG/ACT SUSP Place 1 spray into the nose every 12 (twelve) hours. 23 g 3   Continuous Glucose Receiver (DEXCOM G7 RECEIVER) DEVI Check blood glucose levels continuously E11.65 Z79.84 1 each PRN   Continuous Glucose Sensor (DEXCOM G7 SENSOR) MISC Check blood glucose levels continuously E11.65 Z79.84 2 each PRN   ELDERBERRY PO Take 1 tablet by mouth daily. As needed     ezetimibe  (ZETIA ) 10 MG tablet Take 1 tablet (10 mg total) by mouth daily. 90 tablet 1   glimepiride  (AMARYL ) 1 MG tablet Take 1 tablet (1 mg total) by mouth daily with breakfast. 90 tablet 1   pantoprazole  (PROTONIX ) 20 MG tablet TAKE 1 TABLET BY MOUTH TWICE A DAY 180 tablet 1   polyethylene glycol powder (GLYCOLAX /MIRALAX ) powder Dissolve 17 grams in at  least 8 ounces water /juice and drink once daily. 527 g 2   pregabalin (LYRICA) 25 MG capsule Take 25 mg by mouth 2 (two) times daily.     Probiotic Product (PROBIOTIC DAILY PO) Take 1 tablet by mouth daily. Woman's Care     telmisartan  (MICARDIS ) 80 MG tablet Take 1 tablet (80 mg total) by mouth daily. 90 tablet 1   valACYclovir (VALTREX) 500 MG tablet Take 1 tablet by mouth daily as needed (Flair up). As needed     No facility-administered medications prior to visit.    Allergies  Allergen Reactions   Empagliflozin Other (See Comments)    Flank pain    Metformin Nausea Only   Semaglutide Other (See Comments)    Headache   Tizanidine Other (See Comments)    Causes nightmares       Objective:    BP 135/88 (BP Location: Left Arm, Patient Position: Sitting, Cuff Size: Normal)   Pulse 80   Resp 19   Ht 5' 6 (1.676 m)   Wt 245 lb 12.8 oz (111.5 kg)   SpO2 98%   BMI 39.67 kg/m  Wt Readings from Last 3 Encounters:  12/16/23 245 lb 12.8 oz (111.5 kg)  10/07/23 253 lb 4.8 oz (114.9 kg)  09/10/23 256 lb 6.4 oz (116.3 kg)    Physical Exam Vitals and nursing note reviewed.  Constitutional:      Appearance: She is well-developed.  HENT:     Head: Normocephalic and atraumatic.  Cardiovascular:     Rate and Rhythm: Normal rate and regular rhythm.     Heart sounds: Normal heart sounds. No murmur heard.    No friction rub. No gallop.  Pulmonary:     Effort: Pulmonary effort is normal. No tachypnea or respiratory distress.     Breath sounds: Normal breath sounds. No decreased breath sounds, wheezing, rhonchi or rales.  Chest:     Chest wall: No tenderness.  Musculoskeletal:        General: Normal range of motion.     Cervical back: Normal range of motion.  Skin:    General: Skin is warm and dry.  Neurological:     Mental Status: She is alert and oriented to person, place, and time.     Coordination: Coordination normal.  Psychiatric:        Behavior: Behavior normal.  Behavior is cooperative.        Thought  Content: Thought content normal.        Judgment: Judgment normal.          Patient has been counseled extensively about nutrition and exercise as well as the importance of adherence with medications and regular follow-up. The patient was given clear instructions to go to ER or return to medical center if symptoms don't improve, worsen or new problems develop. The patient verbalized understanding.   Follow-up: Return in about 3 months (around 03/20/2024).   Haze LELON Servant, FNP-BC [facility] [place], [place] [phone]   12/16/2023, 5:09 PM

## 2023-12-16 NOTE — Progress Notes (Signed)
Low blood sugars.

## 2023-12-16 NOTE — Progress Notes (Signed)
 Diabetes Self-Management Education  Visit Type: Follow-updiabetes  Appt. Start Time: 1031 Appt. End Time: 1107  12/27/2023  Norma [name], identified by name and date of birth, is a 53 y.o. female with a diagnosis of Diabetes:  .   ASSESSMENT 8/22: Patient is here today alone for follow up. Pt reports she is in PT currently twice weekly for 60 minutes. Pt reports her glimepiride  was discontinued r/t hypoglycemia. Pt c/o her CGM alarming low since d/c SU and states taking 2 glucose tabs-RD reviewed the rule of 15 and encouraged. Pt reports I eat at different times and different days and states skipping occasionally. Pt reports she typically consumes 3 meals + 1 snacks most days. All Pt's questions were answered during this encounter.    History includes:   Past Medical History:  Diagnosis Date   Allergy    Anemia    hx of   Anxiety    Arthritis    KNEES   Asthma    treated for during COVID tx   Cancer (HCC) 06/2023   left breast IDC   Chronic headache    Colon polyps    TUBULAR ADENOMAS AND HYPERPLASTIC    Complication of anesthesia    DDD (degenerative disc disease), thoracic    Depression    Diabetes mellitus, type 2 (HCC)    Difficult airway for intubation    Fibromyalgia    GERD (gastroesophageal reflux disease)    Goiter    Hyperlipidemia    Hypertension    Low back pain    Obesity    PONV (postoperative nausea and vomiting)    RA (rheumatoid arthritis) (HCC)    Seasonal allergies    Vitamin D  deficiency     Medications include:   Current Outpatient Medications:    anastrozole  (ARIMIDEX ) 1 MG tablet, Take 1 tablet (1 mg total) by mouth daily., Disp: 90 tablet, Rfl: 3   aspirin 81 MG chewable tablet, Chew by mouth daily., Disp: , Rfl:    Azelastine -Fluticasone  137-50 MCG/ACT SUSP, Place 1 spray into the nose every 12 (twelve) hours., Disp: 23 g, Rfl: 3   Continuous Glucose Receiver (DEXCOM G7 RECEIVER) DEVI, Check blood glucose levels continuously E11.65  Z79.84, Disp: 1 each, Rfl: PRN   Continuous Glucose Sensor (DEXCOM G7 SENSOR) MISC, Check blood glucose levels continuously E11.65 Z79.84, Disp: 2 each, Rfl: PRN   ELDERBERRY PO, Take 1 tablet by mouth daily. As needed, Disp: , Rfl:    ezetimibe  (ZETIA ) 10 MG tablet, Take 1 tablet (10 mg total) by mouth daily., Disp: 90 tablet, Rfl: 1   glimepiride  (AMARYL ) 1 MG tablet, Take 1 tablet (1 mg total) by mouth daily with breakfast. (Patient not taking: Reported on 12/27/2023), Disp: 90 tablet, Rfl: 1   pantoprazole  (PROTONIX ) 20 MG tablet, TAKE 1 TABLET BY MOUTH TWICE A DAY, Disp: 180 tablet, Rfl: 1   polyethylene glycol powder (GLYCOLAX /MIRALAX ) powder, Dissolve 17 grams in at least 8 ounces water /juice and drink once daily., Disp: 527 g, Rfl: 2   pregabalin (LYRICA) 25 MG capsule, Take 25 mg by mouth 2 (two) times daily., Disp: , Rfl:    Probiotic Product (PROBIOTIC DAILY PO), Take 1 tablet by mouth daily. Woman's Care, Disp: , Rfl:    telmisartan  (MICARDIS ) 80 MG tablet, Take 1 tablet (80 mg total) by mouth daily., Disp: 90 tablet, Rfl: 1   valACYclovir (VALTREX) 500 MG tablet, Take 1 tablet by mouth daily as needed (Flair up). As needed, Disp: , Rfl:  Labs noted:   Lab Results  Component Value Date   HGBA1C 6.8 12/16/2023   Last vitamin D  Lab Results  Component Value Date   VD25OH 33.2 04/04/2023    Wt Readings from Last 3 Encounters:  12/16/23 245 lb 12.8 oz (111.5 kg)  10/07/23 253 lb 4.8 oz (114.9 kg)  09/10/23 256 lb 6.4 oz (116.3 kg)   There were no vitals taken for this visit. There is no height or weight on file to calculate BMI.   Diabetes Self-Management Education - 12/27/23 1038       Visit Information   Visit Type Follow-up      Complications   Last HgB A1C per patient/outside source 6.8 %    How often do you check your blood sugar? > 4 times/day    Fasting Blood glucose range (mg/dL) 29-870    Postprandial Blood glucose range (mg/dL) 869-820    Number of  hypoglycemic episodes per month 9    Can you tell when your blood sugar is low? No    Number of hyperglycemic episodes ( >200mg /dL): Rare    Can you tell when your blood sugar is high? No    Have you had a dilated eye exam in the past 12 months? Yes    Have you had a dental exam in the past 12 months? Yes    Are you checking your feet? Yes    How many days per week are you checking your feet? 7      Dietary Intake   Breakfast two chicken strips, water     Lunch lean cusine with ~45gCHO, green beans or cabbage    Snack (afternoon) tortilla chips, flavored yogurt    Dinner cube steak, green beans, creamed potatoes with gravy, water     Beverage(s) water , almond milk 2/d/w      Activity / Exercise   How many days per week do you exercise? 2    How many minutes per day do you exercise? 60    Total minutes per week of exercise 120      Patient Self-Evaluation of Goals - Patient rates self as meeting previously set goals (% of time)   Nutrition 50 - 75 % (half of the time)    Physical Activity 50 - 75 % (half of the time)    Medications 50 - 75 % (half of the time)    Monitoring >75% (most of the time)    Problem Solving and behavior change strategies  25 - 50% (sometimes)    Reducing Risk (treating acute and chronic complications) 50 - 75 % (half of the time)    Health Coping 25 - 50% (sometimes)      Post-Education Assessment   Patient understands the diabetes disease and treatment process. Comprehends key points    Patient understands incorporating nutritional management into lifestyle. Needs Review    Patient undertands incorporating physical activity into lifestyle. Comprehends key points    Patient understands using medications safely. Needs Instruction    Patient understands monitoring blood glucose, interpreting and using results Comprehends key points    Patient understands prevention, detection, and treatment of acute complications. Comprehends key points    Patient understands  prevention, detection, and treatment of chronic complications. Comprehends key points    Patient understands how to develop strategies to address psychosocial issues. Comprehends key points    Patient understands how to develop strategies to promote health/change behavior. Comprehends key points      Outcomes  Expected Outcomes Demonstrated interest in learning. Expect positive outcomes    Future DMSE 3-4 months    Program Status Not Completed         Individualized Plan for Diabetes Self-Management Training:   Learning Objective:  Patient will have a greater understanding of diabetes self-management. Patient education plan is to attend individual and/or group sessions per assessed needs and concerns.   Plan:   Patient Instructions  Aim for consistent carbohydrate intake at meal sand snacks  Expected Outcomes:  Demonstrated interest in learning. Expect positive outcomes  Education material provided: ADA - How to Thrive: A Guide for Your Journey with Diabetes, My Plate, Snack sheet, Support group flyer, and Diabetes Resources  If problems or questions, patient to contact team via:  Phone  Future DSME appointment: 3-4 months

## 2023-12-16 NOTE — Therapy (Signed)
 OUTPATIENT PHYSICAL THERAPY BREAST CANCER TREATMENT   Patient Name: Norma [name] MRN: [id number] DOB:Dec 09, 1970, 53 y.o., female Today's Date: 12/16/2023  END OF SESSION:  PT End of Session - 12/16/23 1405     Visit Number 25    Number of Visits 32    Date for Recertification  01/08/24    PT Start Time 1404    PT Stop Time 1457    PT Time Calculation (min) 53 min    Activity Tolerance Patient tolerated treatment well    Behavior During Therapy [facility] for tasks assessed/performed             Past Medical History:  Diagnosis Date   Allergy    Anemia    hx of   Anxiety    Arthritis    KNEES   Asthma    treated for during COVID tx   Cancer (HCC) 06/2023   left breast IDC   Chronic headache    Colon polyps    TUBULAR ADENOMAS AND HYPERPLASTIC    Complication of anesthesia    DDD (degenerative disc disease), thoracic    Depression    Diabetes mellitus, type 2 (HCC)    Difficult airway for intubation    Fibromyalgia    GERD (gastroesophageal reflux disease)    Goiter    Hyperlipidemia    Hypertension    Low back pain    Obesity    PONV (postoperative nausea and vomiting)    RA (rheumatoid arthritis) (HCC)    Seasonal allergies    Vitamin D  deficiency    Past Surgical History:  Procedure Laterality Date   ABDOMINAL HYSTERECTOMY  2008   BACK SURGERY  2005   lumbar laminectomy   BALLOON DILATION N/A 06/01/2021   Procedure: BALLOON DILATION;  Surgeon: Albertus Gordy HERO, MD;  Location: [facility];  Service: Gastroenterology;  Laterality: N/A;   BARTHOLIN GLAND CYST EXCISION     BIOPSY  06/01/2021   Procedure: BIOPSY;  Surgeon: Albertus Gordy HERO, MD;  Location: [facility];  Service: Gastroenterology;;   BREAST LUMPECTOMY WITH RADIOACTIVE SEED AND SENTINEL LYMPH NODE BIOPSY Left 07/02/2023   Procedure: BREAST LUMPECTOMY WITH RADIOACTIVE SEED AND SENTINEL LYMPH NODE BIOPSY;  Surgeon: Aron Shoulders, MD;  Location: [facility];  Service: General;   Laterality: Left;  90 MIN 78444- EXCISION LEFT CHEST WALL MASS GEN COMBINED WITH REGIONAL   COLONOSCOPY  2017   JMP-MAC-prep good-TA -recall 5 yr   COLONOSCOPY WITH PROPOFOL  N/A 06/01/2021   Procedure: COLONOSCOPY WITH PROPOFOL ;  Surgeon: Albertus Gordy HERO, MD;  Location: [facility];  Service: Gastroenterology;  Laterality: N/A;   ESOPHAGOGASTRODUODENOSCOPY (EGD) WITH PROPOFOL  N/A 06/01/2021   Procedure: ESOPHAGOGASTRODUODENOSCOPY (EGD) WITH PROPOFOL ;  Surgeon: Albertus Gordy HERO, MD;  Location: [facility];  Service: Gastroenterology;  Laterality: N/A;   LUMBAR LAMINECTOMY/DECOMPRESSION MICRODISCECTOMY Right 07/17/2013   Procedure: LUMBAR LAMINECTOMY/DECOMPRESSION MICRODISCECTOMY 1 LEVEL,RIGHT LUMBAR FOUR-FIVE;  Surgeon: Darina MALVA Boehringer, MD;  Location: [facility];  Service: Neurosurgery;  Laterality: Right;  Right   MASS EXCISION Left 07/02/2023   Procedure: EXCISION, MASS, CHEST WALL;  Surgeon: Aron Shoulders, MD;  Location:  SURGERY CENTER;  Service: General;  Laterality: Left;   PARTIAL HYSTERECTOMY     POLYPECTOMY  2017   TA and benign polypoid   POLYPECTOMY  06/01/2021   Procedure: POLYPECTOMY;  Surgeon: Albertus Gordy HERO, MD;  Location: [facility];  Service: Gastroenterology;;   WISDOM TOOTH EXTRACTION     Patient Active Problem List  Diagnosis Date Noted   Genetic testing 06/23/2023   Malignant neoplasm of upper-outer quadrant of left breast in female, estrogen receptor positive (HCC) 06/11/2023   Gastritis without bleeding    Esophageal dysphagia    Schatzki's ring    Benign neoplasm of ascending colon    Class 3 severe obesity with serious comorbidity and body mass index (BMI) of 40.0 to 44.9 in adult (HCC) 08/25/2019   Type 2 diabetes mellitus without complication, without long-term current use of insulin (HCC) 08/25/2019   Chronic cough 06/27/2017   LUQ abdominal pain 12/07/2014   RUQ abdominal pain 11/10/2013   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   Gastroesophageal  reflux disease 12/18/2012   Hx of adenomatous colonic polyps 12/18/2012   Chronic RLQ pain 12/18/2012   IBS (irritable bowel syndrome) 01/22/2011   HTN (hypertension) 01/22/2011   Hyperlipidemia 01/22/2011   Anxiety and depression 01/22/2011    PCP: Haze Servant, NP  REFERRING PROVIDER: Dr. Jina Nephew   REFERRING DIAG: Left breast cancer  THERAPY DIAG:  Lymphedema, not elsewhere classified  Stiffness of left shoulder, not elsewhere classified  Aftercare following surgery for neoplasm  Abnormal posture  Malignant neoplasm of upper-outer quadrant of left breast in female, estrogen receptor positive (HCC)  Rationale for Evaluation and Treatment: Rehabilitation  ONSET DATE: 04/26/23  SUBJECTIVE:                                                                                                                                                                                           SUBJECTIVE STATEMENT: I got the swell spot and I have been wearing it. It really leave indentations in my breast and I know you said that was normal.   PERTINENT HISTORY:  Patient was diagnosed on 04/26/2023 with left grade 3 invasive ductal carcinoma breast cancer. It measures 1 cm and is located in the upper outer quadrant. It is ER/PR positive and HER2 negative with a Ki67 of 30%. Pt underwent a L lumpectomy and SLNB 0/2 on 07/02/23  PATIENT GOALS:  Reassess how my recovery is going related to arm function, pain, and swelling.  PAIN:  Are you having pain?  None  PRECAUTIONS: Recent Surgery, left UE Lymphedema risk,   RED FLAGS: None   ACTIVITY LEVEL / LEISURE: doing post op exercises   OBJECTIVE:   PATIENT SURVEYS:  QUICK DASH:     OBSERVATIONS: Healing scars with some increased scar tissue at L axilla  POSTURE:  Forward head and rounded shoulders posture   UPPER EXTREMITY AROM/PROM:   A/PROM RIGHT   eval    Shoulder extension 48  Shoulder flexion 153  Shoulder  abduction  165  Shoulder internal rotation 63  Shoulder external rotation 84                          (Blank rows = not tested)   A/PROM LEFT   eval LEFT  07/25/23 LEFT 08/08/23 LEFT 10/01/23  Shoulder extension 35 70    Shoulder flexion 150 162 171 176  Shoulder abduction 167 156 151 163  Shoulder internal rotation 63 69    Shoulder external rotation 75 80                            (Blank rows = not tested)   CERVICAL AROM: All within normal limits   UPPER EXTREMITY STRENGTH: WFL   LYMPHEDEMA ASSESSMENTS (in cm):    LANDMARK RIGHT   eval  10 cm proximal to olecranon process 38.8  Olecranon process 28.7  10 cm proximal to ulnar styloid process 25.8  Just proximal to ulnar styloid process 17  Across hand at thumb web space 20.5  At base of 2nd digit 5.9  (Blank rows = not tested)   LANDMARK LEFT   eval LEFT 07/25/23  10 cm proximal to olecranon process 40 38.4  Olecranon process 28.8 29.5  10 cm proximal to ulnar styloid process 24.3 23  Just proximal to ulnar styloid process 16.3 16.5  Across hand at thumb web space 19.5 20  At base of 2nd digit 5.9 5.8  (Blank rows = not tested)  Surgery type/Date: 07/02/23 L breast lumpectomy and SLNB Number of lymph nodes removed: 0/2 Current/past treatment (chemo, radiation, hormone therapy): does not need chemo, will require radiation, will need hormone therapy Other symptoms:  Heaviness/tightness Yes Pain Yes Pitting edema No Infections No Decreased scar mobility Yes Stemmer sign No     TREATMENT PERFORMED: 12/16/23: Manual Therapy: Discussed how to wear the swell spot in the bra and how it is ok to sleep in it STM to area of seroma and fibrosis just lateral to areola with pitting edema noted in this area MLD: In supine: Short neck, 5 diaphragmatic breaths, R axillary nodes and establishment of interaxillary pathway, L inguinal nodes and establishment of axilloinguinal pathway, then L breast moving fluid towards pathways spending  extra time in any areas of fibrosis at lateral breast then retracing all steps   12/11/23: Manual Therapy: Discussed how a seroma is different than lymphedema and the importnace of increasing compression to help the seroma heal Created foam chip pack and placed in thick stockinette for pt to wear in her bra for additional compression STM to area of seroma and fibrosis just lateral to areola with pitting edema noted in this area MLD: In supine: Short neck, 5 diaphragmatic breaths, R axillary nodes and establishment of interaxillary pathway, L inguinal nodes and establishment of axilloinguinal pathway, then L breast moving fluid towards pathways spending extra time in any areas of fibrosis at lateral breast then retracing all steps  Issued info for pt to obtain a swell spot for her breast to help decrease lymphedema and heal seroma  11/26/23: Manual Therapy: In supine: Short neck, 5 diaphragmatic breaths, R axillary nodes and establishment of interaxillary pathway, L inguinal nodes and establishment of axilloinguinal pathway, then L breast moving fluid towards pathways spending extra time in any areas of fibrosis at lateral breast then retracing all steps  STM In R sidelying to L serratus and lateral breast in area  of tightness/fibrosis with pt reporting increased tenderness today then back to supine to complete MLD  11/21/23: Manual Therapy: In supine: Short neck, 5 diaphragmatic breaths, R axillary nodes and establishment of interaxillary pathway, L inguinal nodes and establishment of axilloinguinal pathway, then L breast moving fluid towards pathways spending extra time in any areas of fibrosis then retracing all steps then L UE working proximal to distal with increased time spent at axilla and upper arm then retracing all steps.  MFR to L axilla STM using cocoa butter and WAVE tool In R sidelying to L serratus and lateral breast in area of tightness/fibrosis with good improvement noted by end of  session  11/19/23: Manual Therapy: In supine: Short neck, 5 diaphragmatic breaths, R axillary nodes and establishment of interaxillary pathway, L inguinal nodes and establishment of axilloinguinal pathway, then L breast moving fluid towards pathways spending extra time in any areas of fibrosis then retracing all steps then L UE working proximal to distal with increased time spent at axilla and upper arm then retracing all steps.  STM using cocoa butter In R sidelying to L serratus and lateral breast in area of tightness/fibrosis with good improvement noted by end of session  11/14/23: Manual Therapy: In supine: Short neck, 5 diaphragmatic breaths, R axillary nodes and establishment of interaxillary pathway, L inguinal nodes and establishment of axilloinguinal pathway, then L breast moving fluid towards pathways spending extra time in any areas of fibrosis then retracing all steps then L UE working proximal to distal with increased time spent at axilla and upper arm then retracing all steps.  STM using cocoa butter In R sidelying to L serratus and lateral breast in area of tightness/fibrosis with good improvement noted by end of session    The patient was assessed using the L-Dex machine today to produce a lymphedema index baseline score. The patient will be reassessed on a regular basis (typically every 3 months) to obtain new L-Dex scores. If the score is > 6.5 points away from his/her baseline score indicating onset of subclinical lymphedema, it will be recommended to wear a compression garment for 4 weeks, 12 hours per day and then be reassessed. If the score continues to be > 6.5 points from baseline at reassessment, we will initiate lymphedema treatment. Assessing in this manner has a 95% rate of preventing clinically significant lymphedema.   11/12/23: Manual Therapy: In supine: Short neck, 5 diaphragmatic breaths, R axillary nodes and establishment of interaxillary pathway, L inguinal nodes and  establishment of axilloinguinal pathway, then L breast moving fluid towards pathways spending extra time in any areas of fibrosis then retracing all steps then L UE working proximal to distal with increased time spent at axilla and upper arm then retracing all steps.  MFR to area of cording with UE in to abduction with cording more difficult to palpate but definite areas of tightness In R sidelying to L serratus and lateral breast in area of tightness/fibrosis  11/07/23: Manual Therapy: In supine: Short neck, 5 diaphragmatic breaths, R axillary nodes and establishment of interaxillary pathway, L inguinal nodes and establishment of axilloinguinal pathway, then L breast moving fluid towards pathways spending extra time in any areas of fibrosis then retracing all steps then L UE working proximal to distal with increased time spent at axilla and upper arm then retracing all steps.  MFR to area of cording with UE in to abduction with cording more difficult to palpate but definite areas of tightness  11/05/23: Manual Therapy: In  supine: Short neck, 5 diaphragmatic breaths, R axillary nodes and establishment of interaxillary pathway, L inguinal nodes and establishment of axilloinguinal pathway, then L breast moving fluid towards pathways spending extra time in any areas of fibrosis then retracing all steps then L UE working proximal to distal with increased time spent at axilla and upper arm then retracing all steps.  MFR to area of cording with UE in to abduction with cording more difficult to palpate but definite areas of tightness  10/31/23: Manual Therapy: In supine: Short neck, superficial and deep abdominals, Rt axillary and pectoral nodes, anterior intact thorax sequence, establishment of anterior inter-axillary pathway, Lt inguinal nodes and establishment of Lt axillo-inguinal pathway, then L breast moving fluid towards pathways spending extra time in any areas of fibrosis, then into Rt S/L for focus  to fibrosis in lateral breast and found what felt like edges of a seroma near areolar, then finished retracing all steps in supine. Pt reports area that felt like seroma was where she had increased pain over the weekend to the point she took her compression bra off because she like her breast was trying to stretch out of it.  MFR to area of cording found at last session but hard to palpate cords today, also focused on area at Lt lateral trunk that pt reports felt like a cord over past few days and would feel better when she pushed on it. Found area of tightness that may or may not have been cord, but definitely tight regardless P/ROM to Lt shoulder into flex, abd and D2 with scap depression during for varying stretches for cording tightness  10/29/23: Manual Therapy: In supine: Short neck, 5 diaphragmatic breaths, R axillary nodes and establishment of interaxillary pathway, L inguinal nodes and establishment of axilloinguinal pathway, then L breast moving fluid towards pathways spending extra time in any areas of fibrosis then retracing all steps. MFR to numerous cords in L axilla extending down to upper arm with L UE in 100 degrees of abduction 10/24/23: Manual Therapy: Instructed pt in each step of MLD and had her return demonstrate each step with verbal and tactile cues for speed, pressure, direction of stretch and skin stretch technique. Pt completed first half with cueing and therapist completed second half. MLD to Lt breast and upper arm in supine: Short neck, superficial and deep abdominals, Rt axillary and pectoral nodes and establishment of anterior inter-axillary pathway, Lt inguinal nodes and establishment of axillo-inguinal pathway, then Lt breast moving fluid towards pathways and L axilla spending extra time in any areas of fibrosis then LUE working proximal to distal and retracing all steps   10/21/23: Manual Therapy: MLD to Lt breast and upper arm in supine: Short neck, superficial and deep  abdominals, Rt axillary and pectoral nodes and establishment of anterior inter-axillary pathway, Lt inguinal nodes and establishment of axillo-inguinal pathway, then Lt breast moving fluid towards pathways and L axilla spending extra time in any areas of fibrosis then LUE working proximal to distal and retracing all steps  STM when in Rt sidelying to Lt serratus and lateral trunk, also during P/ROM to same P/ROM to Lt shoulder into flex, abd and D2 with scapular depression by therapist throughout  10/17/23: Manual Therapy: MLD to Lt breast and upper arm in supine: Short neck, superficial and deep abdominals, Rt axillary and pectoral nodes and establishment of anterior inter-axillary pathway, Lt inguinal nodes and establishment of axillo-inguinal pathway, then Lt breast moving fluid towards pathways and L axilla spending extra  time in any areas of fibrosis then LUE working proximal to distal and retracing all steps  STM when in Rt sidelying to Lt serratus and lateral trunk Issued instructions for UE MLD  10/15/23: Manual Therapy: MLD to Lt breast and upper arm in supine: Short neck, superficial and deep abdominals, Rt axillary and pectoral nodes and establishment of anterior inter-axillary pathway, Lt inguinal nodes and establishment of axillo-inguinal pathway, then Lt breast moving fluid towards pathways and L axilla spending extra time in any areas of fibrosis then LUE working proximal to distal and retracing all steps  STM when in Rt sidelying to Lt serratus Educated pt to wear her compression sleeve    PATIENT EDUCATION:  Wear compression bra 12 hrs/day for 4 weeks during day time hours Access Code: [email or web address] URL: [email or web address] Date: 08/06/2023 Prepared by: Grayce Sheldon  Exercises - Single Arm Doorway Pec Stretch at 90 Degrees Abduction  - 2 x daily - 7 x weekly - 1 sets - 3 reps - 20-30 hold - Supine Lower Trunk Rotation  - 1 x daily - 7 x weekly - 1 sets - 3 reps -  20-30 hold Education details: nerve desensitization, supine dowel exercises, lymphedema risk reduction and importance of skin care Person educated: Patient Education method: Explanation, Demonstration, Tactile cues, and Handouts Education comprehension: verbalized understanding and returned demonstration  HOME EXERCISE PROGRAM: Reviewed previously given post op HEP. Supine dowel exercises in to abduction and flexion  ASSESSMENT:  CLINICAL IMPRESSION: Pt has obtained a swell spot and has been wearing that in her bra for additional compression. She also received the FlexiTouch and will be fitted with it next Thursday. Continued with MLD to L breast today with increased time spent in area of fibrosis.   Pt will benefit from skilled therapeutic intervention to improve on the following deficits: Decreased knowledge of precautions, impaired UE functional use, pain, decreased ROM, postural dysfunction, increased edema.   PT treatment/interventions: ADL/Self care home management, [phone]- PT Re-evaluation, 97110-Therapeutic exercises, 97530- Therapeutic activity, [id number]- Neuromuscular re-education, 97535- Self Care, 02859- Manual therapy, [id number]- Orthotic Initial, and [id number]- Orthotic/Prosthetic subsequent   GOALS: Goals reviewed with patient? Yes  LONG TERM GOALS:  (STG=LTG)  GOALS Name Target Date  Goal status  1 Pt will demonstrate she has regained full shoulder ROM and function post operatively compared to baselines.  Baseline: 08/22/23 MET  2 Pt will report no pain at end range of L shoulder abduction to allow improved function. 08/22/23 MET  3 Pt will be independent in a home exercise program for continued stretching and strengthening.  08/22/23 MET  4 Pt will be independent in self MLD for long term management of L breast lymphedema and swelling in L axilla to decrease risk of infection. 10/29/23 MET 10/29/23  5 Pt will report a 50% improvement in pain in L breast to allow improved comfort.   11/26/23 11/26/23- ONGOING Varies - some days the pain is much better but then worsens   6 Pt will return to the green on the SOZO demonstrating reversal of subclinical lymphedema.  11/26/23 MET 11/14/23 returned to green  7 Pt will demonstrate a 75% improvement in pain and discomfort of L axilla from cording to allow improved comfort and function 11/26/23 MET 11/26/23  8 Pt will obtain a swell spot for additional compression in her compression bra. 01/08/24 NEW     PLAN:  PT FREQUENCY/DURATION: 2x/wk for 4 wks  PLAN FOR NEXT SESSION: how was chip pack? Did she order swell  spot? Cont MLD to L breast and STM to seroma  [facility]  [address], [address]  [place] [address]  [phone]  Self manual lymph drainage: Do circles behind the collar bones x 10  Perform this sequence once a day.  Only give enough pressure no your skin to make the skin move.  Diaphragmatic - Supine   Inhale through nose making navel move out toward hands. Exhale through puckered lips, hands follow navel in. Repeat _5__ times. Rest _10__ seconds between repeats.   Copyright  VHI. All rights reserved.  Hug yourself.  Do circles at your neck just above your collarbones.  Repeat this 10 times.  Axilla - One at a Time   Using full weight of flat hand and fingers at center of uninvolved (R) armpit, make _10__ in-place circles.   Copyright  VHI. All rights reserved.  LEG: Inguinal Nodes Stimulation   With small finger side of hand against hip crease on involved (L) side, gently perform circles at the crease. Repeat __10_ times.   Copyright  VHI. All rights reserved.  Axilla to Inguinal Nodes - Sweep   On involved side, stretch skin from armpit along side of trunk to hip crease.  Now gently stretch skin from the involved side to the uninvolved side across the chest at the shoulder line.    Move fluid from L armpit in area that feels swollen towards these 2 pathways by gently  stretching skin.   Finish by doing circles in R armpit and L groin.   Finish by doing the pathways as described above going from your involved armpit to the same side groin and going across your upper chest from the involved shoulder to the uninvolved shoulder.  Repeat the steps above where you do circles in your left groin and right armpit. Copyright  VHI. All rights reserved.    Florina Sever Huron, PT 12/16/2023, 2:58 PM

## 2023-12-17 ENCOUNTER — Ambulatory Visit: Payer: Self-pay | Admitting: Nurse Practitioner

## 2023-12-17 LAB — CMP14+EGFR
ALT: 29 IU/L (ref 0–32)
AST: 22 IU/L (ref 0–40)
Albumin: 4.8 g/dL (ref 3.8–4.9)
Alkaline Phosphatase: 80 IU/L (ref 49–135)
BUN/Creatinine Ratio: 17 (ref 9–23)
BUN: 15 mg/dL (ref 6–24)
Bilirubin Total: 0.3 mg/dL (ref 0.0–1.2)
CO2: 23 mmol/L (ref 20–29)
Calcium: 9.9 mg/dL (ref 8.7–10.2)
Chloride: 101 mmol/L (ref 96–106)
Creatinine, Ser: 0.88 mg/dL (ref 0.57–1.00)
Globulin, Total: 2.5 g/dL (ref 1.5–4.5)
Glucose: 89 mg/dL (ref 70–99)
Potassium: 4.4 mmol/L (ref 3.5–5.2)
Sodium: 142 mmol/L (ref 134–144)
Total Protein: 7.3 g/dL (ref 6.0–8.5)
eGFR: 78 mL/min/1.73 (ref >59)

## 2023-12-17 LAB — CBC WITH DIFFERENTIAL/PLATELET
Basophils Absolute: 0 x10E3/uL (ref 0.0–0.2)
Basos: 1 %
EOS (ABSOLUTE): 0.1 x10E3/uL (ref 0.0–0.4)
Eos: 1 %
Hematocrit: 39.5 % (ref 34.0–46.6)
Hemoglobin: 12.6 g/dL (ref 11.1–15.9)
Immature Grans (Abs): 0 x10E3/uL (ref 0.0–0.1)
Immature Granulocytes: 0 %
Lymphocytes Absolute: 1.6 x10E3/uL (ref 0.7–3.1)
Lymphs: 40 %
MCH: 27.2 pg (ref 26.6–33.0)
MCHC: 31.9 g/dL (ref 31.5–35.7)
MCV: 85 fL (ref 79–97)
Monocytes Absolute: 0.4 x10E3/uL (ref 0.1–0.9)
Monocytes: 9 %
Neutrophils Absolute: 2 x10E3/uL (ref 1.4–7.0)
Neutrophils: 49 %
Platelets: 204 x10E3/uL (ref 150–450)
RBC: 4.64 x10E6/uL (ref 3.77–5.28)
RDW: 15 % (ref 11.7–15.4)
WBC: 4.1 x10E3/uL (ref 3.4–10.8)

## 2023-12-18 ENCOUNTER — Ambulatory Visit: Payer: Self-pay | Admitting: Physical Therapy

## 2023-12-18 ENCOUNTER — Encounter: Payer: Self-pay | Admitting: Physical Therapy

## 2023-12-18 DIAGNOSIS — Z483 Aftercare following surgery for neoplasm: Secondary | ICD-10-CM

## 2023-12-18 DIAGNOSIS — I890 Lymphedema, not elsewhere classified: Secondary | ICD-10-CM | POA: Diagnosis not present

## 2023-12-18 DIAGNOSIS — C50412 Malignant neoplasm of upper-outer quadrant of left female breast: Secondary | ICD-10-CM

## 2023-12-18 DIAGNOSIS — M25612 Stiffness of left shoulder, not elsewhere classified: Secondary | ICD-10-CM

## 2023-12-18 DIAGNOSIS — R293 Abnormal posture: Secondary | ICD-10-CM

## 2023-12-18 NOTE — Therapy (Signed)
 OUTPATIENT PHYSICAL THERAPY BREAST CANCER TREATMENT   Patient Name: Norma [name] MRN: [id number] DOB:04/17/1970, 53 y.o., female Today's Date: 12/18/2023  END OF SESSION:  PT End of Session - 12/18/23 0805     Visit Number 26    Number of Visits 32    Date for Recertification  01/08/24    PT Start Time 0802    Activity Tolerance Patient tolerated treatment well    Behavior During Therapy [facility] for tasks assessed/performed             Past Medical History:  Diagnosis Date   Allergy    Anemia    hx of   Anxiety    Arthritis    KNEES   Asthma    treated for during COVID tx   Cancer (HCC) 06/2023   left breast IDC   Chronic headache    Colon polyps    TUBULAR ADENOMAS AND HYPERPLASTIC    Complication of anesthesia    DDD (degenerative disc disease), thoracic    Depression    Diabetes mellitus, type 2 (HCC)    Difficult airway for intubation    Fibromyalgia    GERD (gastroesophageal reflux disease)    Goiter    Hyperlipidemia    Hypertension    Low back pain    Obesity    PONV (postoperative nausea and vomiting)    RA (rheumatoid arthritis) (HCC)    Seasonal allergies    Vitamin D  deficiency    Past Surgical History:  Procedure Laterality Date   ABDOMINAL HYSTERECTOMY  2008   BACK SURGERY  2005   lumbar laminectomy   BALLOON DILATION N/A 06/01/2021   Procedure: BALLOON DILATION;  Surgeon: Albertus Gordy HERO, MD;  Location: [facility];  Service: Gastroenterology;  Laterality: N/A;   BARTHOLIN GLAND CYST EXCISION     BIOPSY  06/01/2021   Procedure: BIOPSY;  Surgeon: Albertus Gordy HERO, MD;  Location: [facility];  Service: Gastroenterology;;   BREAST LUMPECTOMY WITH RADIOACTIVE SEED AND SENTINEL LYMPH NODE BIOPSY Left 07/02/2023   Procedure: BREAST LUMPECTOMY WITH RADIOACTIVE SEED AND SENTINEL LYMPH NODE BIOPSY;  Surgeon: Aron Shoulders, MD;  Location: [facility];  Service: General;  Laterality: Left;  90 MIN [id number]- EXCISION LEFT CHEST WALL  MASS GEN COMBINED WITH REGIONAL   COLONOSCOPY  2017   JMP-MAC-prep good-TA -recall 5 yr   COLONOSCOPY WITH PROPOFOL  N/A 06/01/2021   Procedure: COLONOSCOPY WITH PROPOFOL ;  Surgeon: Albertus Gordy HERO, MD;  Location: [facility];  Service: Gastroenterology;  Laterality: N/A;   ESOPHAGOGASTRODUODENOSCOPY (EGD) WITH PROPOFOL  N/A 06/01/2021   Procedure: ESOPHAGOGASTRODUODENOSCOPY (EGD) WITH PROPOFOL ;  Surgeon: Albertus Gordy HERO, MD;  Location: [facility];  Service: Gastroenterology;  Laterality: N/A;   LUMBAR LAMINECTOMY/DECOMPRESSION MICRODISCECTOMY Right 07/17/2013   Procedure: LUMBAR LAMINECTOMY/DECOMPRESSION MICRODISCECTOMY 1 LEVEL,RIGHT LUMBAR FOUR-FIVE;  Surgeon: Darina MALVA Boehringer, MD;  Location: [facility];  Service: Neurosurgery;  Laterality: Right;  Right   MASS EXCISION Left 07/02/2023   Procedure: EXCISION, MASS, CHEST WALL;  Surgeon: Aron Shoulders, MD;  Location: [facility];  Service: General;  Laterality: Left;   PARTIAL HYSTERECTOMY     POLYPECTOMY  2017   TA and benign polypoid   POLYPECTOMY  06/01/2021   Procedure: POLYPECTOMY;  Surgeon: Albertus Gordy HERO, MD;  Location: [facility];  Service: Gastroenterology;;   WISDOM TOOTH EXTRACTION     Patient Active Problem List   Diagnosis Date Noted   Genetic testing 06/23/2023   Malignant neoplasm of upper-outer quadrant  of left breast in female, estrogen receptor positive (HCC) 06/11/2023   Gastritis without bleeding    Esophageal dysphagia    Schatzki's ring    Benign neoplasm of ascending colon    Class 3 severe obesity with serious comorbidity and body mass index (BMI) of 40.0 to 44.9 in adult (HCC) 08/25/2019   Type 2 diabetes mellitus without complication, without long-term current use of insulin (HCC) 08/25/2019   Chronic cough 06/27/2017   LUQ abdominal pain 12/07/2014   RUQ abdominal pain 11/10/2013   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   Gastroesophageal reflux disease 12/18/2012   Hx of adenomatous colonic polyps  12/18/2012   Chronic RLQ pain 12/18/2012   IBS (irritable bowel syndrome) 01/22/2011   HTN (hypertension) 01/22/2011   Hyperlipidemia 01/22/2011   Anxiety and depression 01/22/2011    PCP: Haze Servant, NP  REFERRING PROVIDER: Dr. Jina Nephew   REFERRING DIAG: Left breast cancer  THERAPY DIAG:  Lymphedema, not elsewhere classified  Stiffness of left shoulder, not elsewhere classified  Aftercare following surgery for neoplasm  Abnormal posture  Malignant neoplasm of upper-outer quadrant of left breast in female, estrogen receptor positive (HCC)  Rationale for Evaluation and Treatment: Rehabilitation  ONSET DATE: 04/26/23  SUBJECTIVE:                                                                                                                                                                                           SUBJECTIVE STATEMENT: My breast is painful today. It is a dull pain.   PERTINENT HISTORY:  Patient was diagnosed on 04/26/2023 with left grade 3 invasive ductal carcinoma breast cancer. It measures 1 cm and is located in the upper outer quadrant. It is ER/PR positive and HER2 negative with a Ki67 of 30%. Pt underwent a L lumpectomy and SLNB 0/2 on 07/02/23  PATIENT GOALS:  Reassess how my recovery is going related to arm function, pain, and swelling.  PAIN:  Are you having pain?  Pain in L breast, 5/10, dull pain, not touching it makes it better  PRECAUTIONS: Recent Surgery, left UE Lymphedema risk,   RED FLAGS: None   ACTIVITY LEVEL / LEISURE: doing post op exercises   OBJECTIVE:   PATIENT SURVEYS:  QUICK DASH:     OBSERVATIONS: Healing scars with some increased scar tissue at L axilla  POSTURE:  Forward head and rounded shoulders posture   UPPER EXTREMITY AROM/PROM:   A/PROM RIGHT   eval    Shoulder extension 48  Shoulder flexion 153  Shoulder abduction 165  Shoulder internal rotation 63  Shoulder external rotation 84                           (  Blank rows = not tested)   A/PROM LEFT   eval LEFT  07/25/23 LEFT 08/08/23 LEFT 10/01/23  Shoulder extension 35 70    Shoulder flexion 150 162 171 176  Shoulder abduction 167 156 151 163  Shoulder internal rotation 63 69    Shoulder external rotation 75 80                            (Blank rows = not tested)   CERVICAL AROM: All within normal limits   UPPER EXTREMITY STRENGTH: WFL   LYMPHEDEMA ASSESSMENTS (in cm):    LANDMARK RIGHT   eval  10 cm proximal to olecranon process 38.8  Olecranon process 28.7  10 cm proximal to ulnar styloid process 25.8  Just proximal to ulnar styloid process 17  Across hand at thumb web space 20.5  At base of 2nd digit 5.9  (Blank rows = not tested)   LANDMARK LEFT   eval LEFT 07/25/23  10 cm proximal to olecranon process 40 38.4  Olecranon process 28.8 29.5  10 cm proximal to ulnar styloid process 24.3 23  Just proximal to ulnar styloid process 16.3 16.5  Across hand at thumb web space 19.5 20  At base of 2nd digit 5.9 5.8  (Blank rows = not tested)  Surgery type/Date: 07/02/23 L breast lumpectomy and SLNB Number of lymph nodes removed: 0/2 Current/past treatment (chemo, radiation, hormone therapy): does not need chemo, will require radiation, will need hormone therapy Other symptoms:  Heaviness/tightness Yes Pain Yes Pitting edema No Infections No Decreased scar mobility Yes Stemmer sign No     TREATMENT PERFORMED: 12/18/23: Manual Therapy: Discussed taking the swell spot out of the bra if she has any discomfort or to readjust throughout the day MLD: In supine: Short neck, 5 diaphragmatic breaths, R axillary nodes and establishment of interaxillary pathway, L inguinal nodes and establishment of axilloinguinal pathway, then L breast moving fluid towards pathways spending extra time in any areas of fibrosis at lateral breast then retracing all steps. Spent increased time at medial and lateral breast where she had  increased discomfort.   12/16/23: Manual Therapy: Discussed how to wear the swell spot in the bra and how it is ok to sleep in it STM to area of seroma and fibrosis just lateral to areola with pitting edema noted in this area MLD: In supine: Short neck, 5 diaphragmatic breaths, R axillary nodes and establishment of interaxillary pathway, L inguinal nodes and establishment of axilloinguinal pathway, then L breast moving fluid towards pathways spending extra time in any areas of fibrosis at lateral breast then retracing all steps   12/11/23: Manual Therapy: Discussed how a seroma is different than lymphedema and the importnace of increasing compression to help the seroma heal Created foam chip pack and placed in thick stockinette for pt to wear in her bra for additional compression STM to area of seroma and fibrosis just lateral to areola with pitting edema noted in this area MLD: In supine: Short neck, 5 diaphragmatic breaths, R axillary nodes and establishment of interaxillary pathway, L inguinal nodes and establishment of axilloinguinal pathway, then L breast moving fluid towards pathways spending extra time in any areas of fibrosis at lateral breast then retracing all steps  Issued info for pt to obtain a swell spot for her breast to help decrease lymphedema and heal seroma  11/26/23: Manual Therapy: In supine: Short neck, 5 diaphragmatic breaths, R axillary nodes and establishment  of interaxillary pathway, L inguinal nodes and establishment of axilloinguinal pathway, then L breast moving fluid towards pathways spending extra time in any areas of fibrosis at lateral breast then retracing all steps  STM In R sidelying to L serratus and lateral breast in area of tightness/fibrosis with pt reporting increased tenderness today then back to supine to complete MLD  11/21/23: Manual Therapy: In supine: Short neck, 5 diaphragmatic breaths, R axillary nodes and establishment of interaxillary pathway, L  inguinal nodes and establishment of axilloinguinal pathway, then L breast moving fluid towards pathways spending extra time in any areas of fibrosis then retracing all steps then L UE working proximal to distal with increased time spent at axilla and upper arm then retracing all steps.  MFR to L axilla STM using cocoa butter and WAVE tool In R sidelying to L serratus and lateral breast in area of tightness/fibrosis with good improvement noted by end of session  11/19/23: Manual Therapy: In supine: Short neck, 5 diaphragmatic breaths, R axillary nodes and establishment of interaxillary pathway, L inguinal nodes and establishment of axilloinguinal pathway, then L breast moving fluid towards pathways spending extra time in any areas of fibrosis then retracing all steps then L UE working proximal to distal with increased time spent at axilla and upper arm then retracing all steps.  STM using cocoa butter In R sidelying to L serratus and lateral breast in area of tightness/fibrosis with good improvement noted by end of session  11/14/23: Manual Therapy: In supine: Short neck, 5 diaphragmatic breaths, R axillary nodes and establishment of interaxillary pathway, L inguinal nodes and establishment of axilloinguinal pathway, then L breast moving fluid towards pathways spending extra time in any areas of fibrosis then retracing all steps then L UE working proximal to distal with increased time spent at axilla and upper arm then retracing all steps.  STM using cocoa butter In R sidelying to L serratus and lateral breast in area of tightness/fibrosis with good improvement noted by end of session    The patient was assessed using the L-Dex machine today to produce a lymphedema index baseline score. The patient will be reassessed on a regular basis (typically every 3 months) to obtain new L-Dex scores. If the score is > 6.5 points away from his/her baseline score indicating onset of subclinical lymphedema, it will be  recommended to wear a compression garment for 4 weeks, 12 hours per day and then be reassessed. If the score continues to be > 6.5 points from baseline at reassessment, we will initiate lymphedema treatment. Assessing in this manner has a 95% rate of preventing clinically significant lymphedema.   11/12/23: Manual Therapy: In supine: Short neck, 5 diaphragmatic breaths, R axillary nodes and establishment of interaxillary pathway, L inguinal nodes and establishment of axilloinguinal pathway, then L breast moving fluid towards pathways spending extra time in any areas of fibrosis then retracing all steps then L UE working proximal to distal with increased time spent at axilla and upper arm then retracing all steps.  MFR to area of cording with UE in to abduction with cording more difficult to palpate but definite areas of tightness In R sidelying to L serratus and lateral breast in area of tightness/fibrosis  11/07/23: Manual Therapy: In supine: Short neck, 5 diaphragmatic breaths, R axillary nodes and establishment of interaxillary pathway, L inguinal nodes and establishment of axilloinguinal pathway, then L breast moving fluid towards pathways spending extra time in any areas of fibrosis then retracing all steps then  L UE working proximal to distal with increased time spent at axilla and upper arm then retracing all steps.  MFR to area of cording with UE in to abduction with cording more difficult to palpate but definite areas of tightness  11/05/23: Manual Therapy: In supine: Short neck, 5 diaphragmatic breaths, R axillary nodes and establishment of interaxillary pathway, L inguinal nodes and establishment of axilloinguinal pathway, then L breast moving fluid towards pathways spending extra time in any areas of fibrosis then retracing all steps then L UE working proximal to distal with increased time spent at axilla and upper arm then retracing all steps.  MFR to area of cording with UE in to abduction  with cording more difficult to palpate but definite areas of tightness  10/31/23: Manual Therapy: In supine: Short neck, superficial and deep abdominals, Rt axillary and pectoral nodes, anterior intact thorax sequence, establishment of anterior inter-axillary pathway, Lt inguinal nodes and establishment of Lt axillo-inguinal pathway, then L breast moving fluid towards pathways spending extra time in any areas of fibrosis, then into Rt S/L for focus to fibrosis in lateral breast and found what felt like edges of a seroma near areolar, then finished retracing all steps in supine. Pt reports area that felt like seroma was where she had increased pain over the weekend to the point she took her compression bra off because she like her breast was trying to stretch out of it.  MFR to area of cording found at last session but hard to palpate cords today, also focused on area at Lt lateral trunk that pt reports felt like a cord over past few days and would feel better when she pushed on it. Found area of tightness that may or may not have been cord, but definitely tight regardless P/ROM to Lt shoulder into flex, abd and D2 with scap depression during for varying stretches for cording tightness  10/29/23: Manual Therapy: In supine: Short neck, 5 diaphragmatic breaths, R axillary nodes and establishment of interaxillary pathway, L inguinal nodes and establishment of axilloinguinal pathway, then L breast moving fluid towards pathways spending extra time in any areas of fibrosis then retracing all steps. MFR to numerous cords in L axilla extending down to upper arm with L UE in 100 degrees of abduction 10/24/23: Manual Therapy: Instructed pt in each step of MLD and had her return demonstrate each step with verbal and tactile cues for speed, pressure, direction of stretch and skin stretch technique. Pt completed first half with cueing and therapist completed second half. MLD to Lt breast and upper arm in supine: Short  neck, superficial and deep abdominals, Rt axillary and pectoral nodes and establishment of anterior inter-axillary pathway, Lt inguinal nodes and establishment of axillo-inguinal pathway, then Lt breast moving fluid towards pathways and L axilla spending extra time in any areas of fibrosis then LUE working proximal to distal and retracing all steps   10/21/23: Manual Therapy: MLD to Lt breast and upper arm in supine: Short neck, superficial and deep abdominals, Rt axillary and pectoral nodes and establishment of anterior inter-axillary pathway, Lt inguinal nodes and establishment of axillo-inguinal pathway, then Lt breast moving fluid towards pathways and L axilla spending extra time in any areas of fibrosis then LUE working proximal to distal and retracing all steps  STM when in Rt sidelying to Lt serratus and lateral trunk, also during P/ROM to same P/ROM to Lt shoulder into flex, abd and D2 with scapular depression by therapist throughout  10/17/23: Manual Therapy:  MLD to Lt breast and upper arm in supine: Short neck, superficial and deep abdominals, Rt axillary and pectoral nodes and establishment of anterior inter-axillary pathway, Lt inguinal nodes and establishment of axillo-inguinal pathway, then Lt breast moving fluid towards pathways and L axilla spending extra time in any areas of fibrosis then LUE working proximal to distal and retracing all steps  STM when in Rt sidelying to Lt serratus and lateral trunk Issued instructions for UE MLD  10/15/23: Manual Therapy: MLD to Lt breast and upper arm in supine: Short neck, superficial and deep abdominals, Rt axillary and pectoral nodes and establishment of anterior inter-axillary pathway, Lt inguinal nodes and establishment of axillo-inguinal pathway, then Lt breast moving fluid towards pathways and L axilla spending extra time in any areas of fibrosis then LUE working proximal to distal and retracing all steps  STM when in Rt sidelying to Lt  serratus Educated pt to wear her compression sleeve    PATIENT EDUCATION:  Wear compression bra 12 hrs/day for 4 weeks during day time hours Access Code: [email or web address] URL: [email or web address] Date: 08/06/2023 Prepared by: Grayce Sheldon  Exercises - Single Arm Doorway Pec Stretch at 90 Degrees Abduction  - 2 x daily - 7 x weekly - 1 sets - 3 reps - 20-30 hold - Supine Lower Trunk Rotation  - 1 x daily - 7 x weekly - 1 sets - 3 reps - 20-30 hold Education details: nerve desensitization, supine dowel exercises, lymphedema risk reduction and importance of skin care Person educated: Patient Education method: Explanation, Demonstration, Tactile cues, and Handouts Education comprehension: verbalized understanding and returned demonstration  HOME EXERCISE PROGRAM: Reviewed previously given post op HEP. Supine dowel exercises in to abduction and flexion  ASSESSMENT:  CLINICAL IMPRESSION: Pt reports increased tenderness in her breast today. The seroma is palpable but feels softer than last session. There is some fibrosis at medial inferior bresat so spent increased time at this area to help move fluid and pt reports increased comfort in this area at end of session.   Pt will benefit from skilled therapeutic intervention to improve on the following deficits: Decreased knowledge of precautions, impaired UE functional use, pain, decreased ROM, postural dysfunction, increased edema.   PT treatment/interventions: ADL/Self care home management, [phone]- PT Re-evaluation, 97110-Therapeutic exercises, 97530- Therapeutic activity, [id number]- Neuromuscular re-education, 97535- Self Care, 02859- Manual therapy, [id number]- Orthotic Initial, and [id number]- Orthotic/Prosthetic subsequent   GOALS: Goals reviewed with patient? Yes  LONG TERM GOALS:  (STG=LTG)  GOALS Name Target Date  Goal status  1 Pt will demonstrate she has regained full shoulder ROM and function post operatively compared to  baselines.  Baseline: 08/22/23 MET  2 Pt will report no pain at end range of L shoulder abduction to allow improved function. 08/22/23 MET  3 Pt will be independent in a home exercise program for continued stretching and strengthening.  08/22/23 MET  4 Pt will be independent in self MLD for long term management of L breast lymphedema and swelling in L axilla to decrease risk of infection. 10/29/23 MET 10/29/23  5 Pt will report a 50% improvement in pain in L breast to allow improved comfort.  11/26/23 11/26/23- ONGOING Varies - some days the pain is much better but then worsens   6 Pt will return to the green on the SOZO demonstrating reversal of subclinical lymphedema.  11/26/23 MET 11/14/23 returned to green  7 Pt will demonstrate a 75% improvement in pain and discomfort of L axilla from cording to  allow improved comfort and function 11/26/23 MET 11/26/23  8 Pt will obtain a swell spot for additional compression in her compression bra. 01/08/24 NEW     PLAN:  PT FREQUENCY/DURATION: 2x/wk for 4 wks  PLAN FOR NEXT SESSION: Cont MLD to L breast and STM to seroma  [facility]  [address], [address]  [place] [address]  [phone]  Self manual lymph drainage: Do circles behind the collar bones x 10  Perform this sequence once a day.  Only give enough pressure no your skin to make the skin move.  Diaphragmatic - Supine   Inhale through nose making navel move out toward hands. Exhale through puckered lips, hands follow navel in. Repeat _5__ times. Rest _10__ seconds between repeats.   Copyright  VHI. All rights reserved.  Hug yourself.  Do circles at your neck just above your collarbones.  Repeat this 10 times.  Axilla - One at a Time   Using full weight of flat hand and fingers at center of uninvolved (R) armpit, make _10__ in-place circles.   Copyright  VHI. All rights reserved.  LEG: Inguinal Nodes Stimulation   With small finger side of hand against hip  crease on involved (L) side, gently perform circles at the crease. Repeat __10_ times.   Copyright  VHI. All rights reserved.  Axilla to Inguinal Nodes - Sweep   On involved side, stretch skin from armpit along side of trunk to hip crease.  Now gently stretch skin from the involved side to the uninvolved side across the chest at the shoulder line.    Move fluid from L armpit in area that feels swollen towards these 2 pathways by gently stretching skin.   Finish by doing circles in R armpit and L groin.   Finish by doing the pathways as described above going from your involved armpit to the same side groin and going across your upper chest from the involved shoulder to the uninvolved shoulder.  Repeat the steps above where you do circles in your left groin and right armpit. Copyright  VHI. All rights reserved.    [facility], PT 12/18/2023, 8:58 AM

## 2023-12-24 ENCOUNTER — Encounter: Payer: Self-pay | Admitting: Physical Therapy

## 2023-12-24 ENCOUNTER — Ambulatory Visit: Payer: Self-pay | Admitting: Physical Therapy

## 2023-12-24 DIAGNOSIS — I890 Lymphedema, not elsewhere classified: Secondary | ICD-10-CM | POA: Diagnosis not present

## 2023-12-24 DIAGNOSIS — M25612 Stiffness of left shoulder, not elsewhere classified: Secondary | ICD-10-CM

## 2023-12-24 DIAGNOSIS — R293 Abnormal posture: Secondary | ICD-10-CM

## 2023-12-24 DIAGNOSIS — C50412 Malignant neoplasm of upper-outer quadrant of left female breast: Secondary | ICD-10-CM

## 2023-12-24 DIAGNOSIS — Z483 Aftercare following surgery for neoplasm: Secondary | ICD-10-CM

## 2023-12-24 NOTE — Therapy (Signed)
 OUTPATIENT PHYSICAL THERAPY BREAST CANCER TREATMENT   Patient Name: Norma [name] MRN: [id number] DOB:02/09/1971, 53 y.o., female Today's Date: 12/24/2023  END OF SESSION:  PT End of Session - 12/24/23 1606     Visit Number 27    Number of Visits 32    Date for Recertification  01/08/24    PT Start Time 1606    PT Stop Time 1652    PT Time Calculation (min) 46 min    Activity Tolerance Patient tolerated treatment well    Behavior During Therapy [facility] for tasks assessed/performed             Past Medical History:  Diagnosis Date   Allergy    Anemia    hx of   Anxiety    Arthritis    KNEES   Asthma    treated for during COVID tx   Cancer (HCC) 06/2023   left breast IDC   Chronic headache    Colon polyps    TUBULAR ADENOMAS AND HYPERPLASTIC    Complication of anesthesia    DDD (degenerative disc disease), thoracic    Depression    Diabetes mellitus, type 2 (HCC)    Difficult airway for intubation    Fibromyalgia    GERD (gastroesophageal reflux disease)    Goiter    Hyperlipidemia    Hypertension    Low back pain    Obesity    PONV (postoperative nausea and vomiting)    RA (rheumatoid arthritis) (HCC)    Seasonal allergies    Vitamin D  deficiency    Past Surgical History:  Procedure Laterality Date   ABDOMINAL HYSTERECTOMY  2008   BACK SURGERY  2005   lumbar laminectomy   BALLOON DILATION N/A 06/01/2021   Procedure: BALLOON DILATION;  Surgeon: Albertus Gordy HERO, MD;  Location: [facility];  Service: Gastroenterology;  Laterality: N/A;   BARTHOLIN GLAND CYST EXCISION     BIOPSY  06/01/2021   Procedure: BIOPSY;  Surgeon: Albertus Gordy HERO, MD;  Location: [facility];  Service: Gastroenterology;;   BREAST LUMPECTOMY WITH RADIOACTIVE SEED AND SENTINEL LYMPH NODE BIOPSY Left 07/02/2023   Procedure: BREAST LUMPECTOMY WITH RADIOACTIVE SEED AND SENTINEL LYMPH NODE BIOPSY;  Surgeon: Aron Shoulders, MD;  Location: [facility];  Service: General;   Laterality: Left;  90 MIN 78444- EXCISION LEFT CHEST WALL MASS GEN COMBINED WITH REGIONAL   COLONOSCOPY  2017   JMP-MAC-prep good-TA -recall 5 yr   COLONOSCOPY WITH PROPOFOL  N/A 06/01/2021   Procedure: COLONOSCOPY WITH PROPOFOL ;  Surgeon: Albertus Gordy HERO, MD;  Location: [facility];  Service: Gastroenterology;  Laterality: N/A;   ESOPHAGOGASTRODUODENOSCOPY (EGD) WITH PROPOFOL  N/A 06/01/2021   Procedure: ESOPHAGOGASTRODUODENOSCOPY (EGD) WITH PROPOFOL ;  Surgeon: Albertus Gordy HERO, MD;  Location: [facility];  Service: Gastroenterology;  Laterality: N/A;   LUMBAR LAMINECTOMY/DECOMPRESSION MICRODISCECTOMY Right 07/17/2013   Procedure: LUMBAR LAMINECTOMY/DECOMPRESSION MICRODISCECTOMY 1 LEVEL,RIGHT LUMBAR FOUR-FIVE;  Surgeon: Darina MALVA Boehringer, MD;  Location: [facility];  Service: Neurosurgery;  Laterality: Right;  Right   MASS EXCISION Left 07/02/2023   Procedure: EXCISION, MASS, CHEST WALL;  Surgeon: Aron Shoulders, MD;  Location: [facility];  Service: General;  Laterality: Left;   PARTIAL HYSTERECTOMY     POLYPECTOMY  2017   TA and benign polypoid   POLYPECTOMY  06/01/2021   Procedure: POLYPECTOMY;  Surgeon: Albertus Gordy HERO, MD;  Location: [facility];  Service: Gastroenterology;;   WISDOM TOOTH EXTRACTION     Patient Active Problem List  Diagnosis Date Noted   Genetic testing 06/23/2023   Malignant neoplasm of upper-outer quadrant of left breast in female, estrogen receptor positive (HCC) 06/11/2023   Gastritis without bleeding    Esophageal dysphagia    Schatzki's ring    Benign neoplasm of ascending colon    Class 3 severe obesity with serious comorbidity and body mass index (BMI) of 40.0 to 44.9 in adult (HCC) 08/25/2019   Type 2 diabetes mellitus without complication, without long-term current use of insulin (HCC) 08/25/2019   Chronic cough 06/27/2017   LUQ abdominal pain 12/07/2014   RUQ abdominal pain 11/10/2013   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   Gastroesophageal  reflux disease 12/18/2012   Hx of adenomatous colonic polyps 12/18/2012   Chronic RLQ pain 12/18/2012   IBS (irritable bowel syndrome) 01/22/2011   HTN (hypertension) 01/22/2011   Hyperlipidemia 01/22/2011   Anxiety and depression 01/22/2011    PCP: Haze Servant, NP  REFERRING PROVIDER: Dr. Jina Nephew   REFERRING DIAG: Left breast cancer  THERAPY DIAG:  Lymphedema, not elsewhere classified  Stiffness of left shoulder, not elsewhere classified  Aftercare following surgery for neoplasm  Abnormal posture  Malignant neoplasm of upper-outer quadrant of left breast in female, estrogen receptor positive (HCC)  Rationale for Evaluation and Treatment: Rehabilitation  ONSET DATE: 04/26/23  SUBJECTIVE:                                                                                                                                                                                           SUBJECTIVE STATEMENT: My breast had times where it was painful but I was able to adjust the swell spot to make it better.   PERTINENT HISTORY:  Patient was diagnosed on 04/26/2023 with left grade 3 invasive ductal carcinoma breast cancer. It measures 1 cm and is located in the upper outer quadrant. It is ER/PR positive and HER2 negative with a Ki67 of 30%. Pt underwent a L lumpectomy and SLNB 0/2 on 07/02/23  PATIENT GOALS:  Reassess how my recovery is going related to arm function, pain, and swelling.  PAIN:  Are you having pain?  Pain in L breast, 5/10, dull pain, not touching it makes it better  PRECAUTIONS: Recent Surgery, left UE Lymphedema risk,   RED FLAGS: None   ACTIVITY LEVEL / LEISURE: doing post op exercises   OBJECTIVE:   PATIENT SURVEYS:  QUICK DASH:     OBSERVATIONS: Healing scars with some increased scar tissue at L axilla  POSTURE:  Forward head and rounded shoulders posture   UPPER EXTREMITY AROM/PROM:   A/PROM RIGHT   eval    Shoulder extension  48  Shoulder  flexion 153  Shoulder abduction 165  Shoulder internal rotation 63  Shoulder external rotation 84                          (Blank rows = not tested)   A/PROM LEFT   eval LEFT  07/25/23 LEFT 08/08/23 LEFT 10/01/23  Shoulder extension 35 70    Shoulder flexion 150 162 171 176  Shoulder abduction 167 156 151 163  Shoulder internal rotation 63 69    Shoulder external rotation 75 80                            (Blank rows = not tested)   CERVICAL AROM: All within normal limits   UPPER EXTREMITY STRENGTH: WFL   LYMPHEDEMA ASSESSMENTS (in cm):    LANDMARK RIGHT   eval  10 cm proximal to olecranon process 38.8  Olecranon process 28.7  10 cm proximal to ulnar styloid process 25.8  Just proximal to ulnar styloid process 17  Across hand at thumb web space 20.5  At base of 2nd digit 5.9  (Blank rows = not tested)   LANDMARK LEFT   eval LEFT 07/25/23  10 cm proximal to olecranon process 40 38.4  Olecranon process 28.8 29.5  10 cm proximal to ulnar styloid process 24.3 23  Just proximal to ulnar styloid process 16.3 16.5  Across hand at thumb web space 19.5 20  At base of 2nd digit 5.9 5.8  (Blank rows = not tested)  Surgery type/Date: 07/02/23 L breast lumpectomy and SLNB Number of lymph nodes removed: 0/2 Current/past treatment (chemo, radiation, hormone therapy): does not need chemo, will require radiation, will need hormone therapy Other symptoms:  Heaviness/tightness Yes Pain Yes Pitting edema No Infections No Decreased scar mobility Yes Stemmer sign No     TREATMENT PERFORMED: 12/24/23 Manual Therapy: MLD: In supine: Short neck, 5 diaphragmatic breaths, R axillary nodes and establishment of interaxillary pathway, L inguinal nodes and establishment of axilloinguinal pathway, then L breast moving fluid towards pathways then to R sidelying working on L lateral trunk moving fluid towards pathway at side and to posterior inter axillary pathway spending extra time in any  areas of fibrosis at lateral breast then retracing all steps in supine.  STM to area of seroma at lateral breast - area felt softer today  12/18/23: Manual Therapy: Discussed taking the swell spot out of the bra if she has any discomfort or to readjust throughout the day MLD: In supine: Short neck, 5 diaphragmatic breaths, R axillary nodes and establishment of interaxillary pathway, L inguinal nodes and establishment of axilloinguinal pathway, then L breast moving fluid towards pathways spending extra time in any areas of fibrosis at lateral breast then retracing all steps. Spent increased time at medial and lateral breast where she had increased discomfort.   12/16/23: Manual Therapy: Discussed how to wear the swell spot in the bra and how it is ok to sleep in it STM to area of seroma and fibrosis just lateral to areola with pitting edema noted in this area MLD: In supine: Short neck, 5 diaphragmatic breaths, R axillary nodes and establishment of interaxillary pathway, L inguinal nodes and establishment of axilloinguinal pathway, then L breast moving fluid towards pathways spending extra time in any areas of fibrosis at lateral breast then retracing all steps   12/11/23: Manual Therapy: Discussed how a seroma is different  than lymphedema and the importnace of increasing compression to help the seroma heal Created foam chip pack and placed in thick stockinette for pt to wear in her bra for additional compression STM to area of seroma and fibrosis just lateral to areola with pitting edema noted in this area MLD: In supine: Short neck, 5 diaphragmatic breaths, R axillary nodes and establishment of interaxillary pathway, L inguinal nodes and establishment of axilloinguinal pathway, then L breast moving fluid towards pathways spending extra time in any areas of fibrosis at lateral breast then retracing all steps  Issued info for pt to obtain a swell spot for her breast to help decrease lymphedema and  heal seroma  11/26/23: Manual Therapy: In supine: Short neck, 5 diaphragmatic breaths, R axillary nodes and establishment of interaxillary pathway, L inguinal nodes and establishment of axilloinguinal pathway, then L breast moving fluid towards pathways spending extra time in any areas of fibrosis at lateral breast then retracing all steps  STM In R sidelying to L serratus and lateral breast in area of tightness/fibrosis with pt reporting increased tenderness today then back to supine to complete MLD  11/21/23: Manual Therapy: In supine: Short neck, 5 diaphragmatic breaths, R axillary nodes and establishment of interaxillary pathway, L inguinal nodes and establishment of axilloinguinal pathway, then L breast moving fluid towards pathways spending extra time in any areas of fibrosis then retracing all steps then L UE working proximal to distal with increased time spent at axilla and upper arm then retracing all steps.  MFR to L axilla STM using cocoa butter and WAVE tool In R sidelying to L serratus and lateral breast in area of tightness/fibrosis with good improvement noted by end of session  11/19/23: Manual Therapy: In supine: Short neck, 5 diaphragmatic breaths, R axillary nodes and establishment of interaxillary pathway, L inguinal nodes and establishment of axilloinguinal pathway, then L breast moving fluid towards pathways spending extra time in any areas of fibrosis then retracing all steps then L UE working proximal to distal with increased time spent at axilla and upper arm then retracing all steps.  STM using cocoa butter In R sidelying to L serratus and lateral breast in area of tightness/fibrosis with good improvement noted by end of session  11/14/23: Manual Therapy: In supine: Short neck, 5 diaphragmatic breaths, R axillary nodes and establishment of interaxillary pathway, L inguinal nodes and establishment of axilloinguinal pathway, then L breast moving fluid towards pathways spending  extra time in any areas of fibrosis then retracing all steps then L UE working proximal to distal with increased time spent at axilla and upper arm then retracing all steps.  STM using cocoa butter In R sidelying to L serratus and lateral breast in area of tightness/fibrosis with good improvement noted by end of session    The patient was assessed using the L-Dex machine today to produce a lymphedema index baseline score. The patient will be reassessed on a regular basis (typically every 3 months) to obtain new L-Dex scores. If the score is > 6.5 points away from his/her baseline score indicating onset of subclinical lymphedema, it will be recommended to wear a compression garment for 4 weeks, 12 hours per day and then be reassessed. If the score continues to be > 6.5 points from baseline at reassessment, we will initiate lymphedema treatment. Assessing in this manner has a 95% rate of preventing clinically significant lymphedema.   11/12/23: Manual Therapy: In supine: Short neck, 5 diaphragmatic breaths, R axillary nodes and establishment  of interaxillary pathway, L inguinal nodes and establishment of axilloinguinal pathway, then L breast moving fluid towards pathways spending extra time in any areas of fibrosis then retracing all steps then L UE working proximal to distal with increased time spent at axilla and upper arm then retracing all steps.  MFR to area of cording with UE in to abduction with cording more difficult to palpate but definite areas of tightness In R sidelying to L serratus and lateral breast in area of tightness/fibrosis  11/07/23: Manual Therapy: In supine: Short neck, 5 diaphragmatic breaths, R axillary nodes and establishment of interaxillary pathway, L inguinal nodes and establishment of axilloinguinal pathway, then L breast moving fluid towards pathways spending extra time in any areas of fibrosis then retracing all steps then L UE working proximal to distal with increased time  spent at axilla and upper arm then retracing all steps.  MFR to area of cording with UE in to abduction with cording more difficult to palpate but definite areas of tightness  11/05/23: Manual Therapy: In supine: Short neck, 5 diaphragmatic breaths, R axillary nodes and establishment of interaxillary pathway, L inguinal nodes and establishment of axilloinguinal pathway, then L breast moving fluid towards pathways spending extra time in any areas of fibrosis then retracing all steps then L UE working proximal to distal with increased time spent at axilla and upper arm then retracing all steps.  MFR to area of cording with UE in to abduction with cording more difficult to palpate but definite areas of tightness  10/31/23: Manual Therapy: In supine: Short neck, superficial and deep abdominals, Rt axillary and pectoral nodes, anterior intact thorax sequence, establishment of anterior inter-axillary pathway, Lt inguinal nodes and establishment of Lt axillo-inguinal pathway, then L breast moving fluid towards pathways spending extra time in any areas of fibrosis, then into Rt S/L for focus to fibrosis in lateral breast and found what felt like edges of a seroma near areolar, then finished retracing all steps in supine. Pt reports area that felt like seroma was where she had increased pain over the weekend to the point she took her compression bra off because she like her breast was trying to stretch out of it.  MFR to area of cording found at last session but hard to palpate cords today, also focused on area at Lt lateral trunk that pt reports felt like a cord over past few days and would feel better when she pushed on it. Found area of tightness that may or may not have been cord, but definitely tight regardless P/ROM to Lt shoulder into flex, abd and D2 with scap depression during for varying stretches for cording tightness  10/29/23: Manual Therapy: In supine: Short neck, 5 diaphragmatic breaths, R  axillary nodes and establishment of interaxillary pathway, L inguinal nodes and establishment of axilloinguinal pathway, then L breast moving fluid towards pathways spending extra time in any areas of fibrosis then retracing all steps. MFR to numerous cords in L axilla extending down to upper arm with L UE in 100 degrees of abduction 10/24/23: Manual Therapy: Instructed pt in each step of MLD and had her return demonstrate each step with verbal and tactile cues for speed, pressure, direction of stretch and skin stretch technique. Pt completed first half with cueing and therapist completed second half. MLD to Lt breast and upper arm in supine: Short neck, superficial and deep abdominals, Rt axillary and pectoral nodes and establishment of anterior inter-axillary pathway, Lt inguinal nodes and establishment of axillo-inguinal  pathway, then Lt breast moving fluid towards pathways and L axilla spending extra time in any areas of fibrosis then LUE working proximal to distal and retracing all steps   10/21/23: Manual Therapy: MLD to Lt breast and upper arm in supine: Short neck, superficial and deep abdominals, Rt axillary and pectoral nodes and establishment of anterior inter-axillary pathway, Lt inguinal nodes and establishment of axillo-inguinal pathway, then Lt breast moving fluid towards pathways and L axilla spending extra time in any areas of fibrosis then LUE working proximal to distal and retracing all steps  STM when in Rt sidelying to Lt serratus and lateral trunk, also during P/ROM to same P/ROM to Lt shoulder into flex, abd and D2 with scapular depression by therapist throughout  10/17/23: Manual Therapy: MLD to Lt breast and upper arm in supine: Short neck, superficial and deep abdominals, Rt axillary and pectoral nodes and establishment of anterior inter-axillary pathway, Lt inguinal nodes and establishment of axillo-inguinal pathway, then Lt breast moving fluid towards pathways and L axilla  spending extra time in any areas of fibrosis then LUE working proximal to distal and retracing all steps  STM when in Rt sidelying to Lt serratus and lateral trunk Issued instructions for UE MLD  10/15/23: Manual Therapy: MLD to Lt breast and upper arm in supine: Short neck, superficial and deep abdominals, Rt axillary and pectoral nodes and establishment of anterior inter-axillary pathway, Lt inguinal nodes and establishment of axillo-inguinal pathway, then Lt breast moving fluid towards pathways and L axilla spending extra time in any areas of fibrosis then LUE working proximal to distal and retracing all steps  STM when in Rt sidelying to Lt serratus Educated pt to wear her compression sleeve    PATIENT EDUCATION:  Wear compression bra 12 hrs/day for 4 weeks during day time hours Access Code: [email or web address] URL: [email or web address] Date: 08/06/2023 Prepared by: Grayce Sheldon  Exercises - Single Arm Doorway Pec Stretch at 90 Degrees Abduction  - 2 x daily - 7 x weekly - 1 sets - 3 reps - 20-30 hold - Supine Lower Trunk Rotation  - 1 x daily - 7 x weekly - 1 sets - 3 reps - 20-30 hold Education details: nerve desensitization, supine dowel exercises, lymphedema risk reduction and importance of skin care Person educated: Patient Education method: Explanation, Demonstration, Tactile cues, and Handouts Education comprehension: verbalized understanding and returned demonstration  HOME EXERCISE PROGRAM: Reviewed previously given post op HEP. Supine dowel exercises in to abduction and flexion  ASSESSMENT:  CLINICAL IMPRESSION: Pt has been wearing the swell spot. She reports she has to re adjust it to keep it from getting uncomfortable. The breast is softer today and her seroma feels softer. There was increased fibrosis at lateral trunk so spent increased time here.   Pt will benefit from skilled therapeutic intervention to improve on the following deficits: Decreased knowledge  of precautions, impaired UE functional use, pain, decreased ROM, postural dysfunction, increased edema.   PT treatment/interventions: ADL/Self care home management, [phone]- PT Re-evaluation, 97110-Therapeutic exercises, 97530- Therapeutic activity, [id number]- Neuromuscular re-education, 97535- Self Care, 02859- Manual therapy, [id number]- Orthotic Initial, and [id number]- Orthotic/Prosthetic subsequent   GOALS: Goals reviewed with patient? Yes  LONG TERM GOALS:  (STG=LTG)  GOALS Name Target Date  Goal status  1 Pt will demonstrate she has regained full shoulder ROM and function post operatively compared to baselines.  Baseline: 08/22/23 MET  2 Pt will report no pain at end range of L shoulder abduction to allow improved function.  08/22/23 MET  3 Pt will be independent in a home exercise program for continued stretching and strengthening.  08/22/23 MET  4 Pt will be independent in self MLD for long term management of L breast lymphedema and swelling in L axilla to decrease risk of infection. 10/29/23 MET 10/29/23  5 Pt will report a 50% improvement in pain in L breast to allow improved comfort.  11/26/23 11/26/23- ONGOING Varies - some days the pain is much better but then worsens   6 Pt will return to the green on the SOZO demonstrating reversal of subclinical lymphedema.  11/26/23 MET 11/14/23 returned to green  7 Pt will demonstrate a 75% improvement in pain and discomfort of L axilla from cording to allow improved comfort and function 11/26/23 MET 11/26/23  8 Pt will obtain a swell spot for additional compression in her compression bra. 01/08/24 NEW     PLAN:  PT FREQUENCY/DURATION: 2x/wk for 4 wks  PLAN FOR NEXT SESSION: Cont MLD to L breast and STM to seroma  [facility]  [address], [address]  [place] [address]  [phone]  Self manual lymph drainage: Do circles behind the collar bones x 10  Perform this sequence once a day.  Only give enough pressure no your skin to  make the skin move.  Diaphragmatic - Supine   Inhale through nose making navel move out toward hands. Exhale through puckered lips, hands follow navel in. Repeat _5__ times. Rest _10__ seconds between repeats.   Copyright  VHI. All rights reserved.  Hug yourself.  Do circles at your neck just above your collarbones.  Repeat this 10 times.  Axilla - One at a Time   Using full weight of flat hand and fingers at center of uninvolved (R) armpit, make _10__ in-place circles.   Copyright  VHI. All rights reserved.  LEG: Inguinal Nodes Stimulation   With small finger side of hand against hip crease on involved (L) side, gently perform circles at the crease. Repeat __10_ times.   Copyright  VHI. All rights reserved.  Axilla to Inguinal Nodes - Sweep   On involved side, stretch skin from armpit along side of trunk to hip crease.  Now gently stretch skin from the involved side to the uninvolved side across the chest at the shoulder line.    Move fluid from L armpit in area that feels swollen towards these 2 pathways by gently stretching skin.   Finish by doing circles in R armpit and L groin.   Finish by doing the pathways as described above going from your involved armpit to the same side groin and going across your upper chest from the involved shoulder to the uninvolved shoulder.  Repeat the steps above where you do circles in your left groin and right armpit. Copyright  VHI. All rights reserved.    [facility], PT 12/24/2023, 5:05 PM

## 2023-12-25 ENCOUNTER — Encounter: Payer: Self-pay | Admitting: Podiatry

## 2023-12-25 ENCOUNTER — Ambulatory Visit (INDEPENDENT_AMBULATORY_CARE_PROVIDER_SITE_OTHER): Admitting: Podiatry

## 2023-12-25 DIAGNOSIS — M21619 Bunion of unspecified foot: Secondary | ICD-10-CM | POA: Diagnosis not present

## 2023-12-25 DIAGNOSIS — L600 Ingrowing nail: Secondary | ICD-10-CM

## 2023-12-25 NOTE — Patient Instructions (Signed)

## 2023-12-26 ENCOUNTER — Encounter: Payer: Self-pay | Admitting: Physical Therapy

## 2023-12-26 ENCOUNTER — Ambulatory Visit: Payer: Self-pay | Admitting: Physical Therapy

## 2023-12-26 DIAGNOSIS — Z483 Aftercare following surgery for neoplasm: Secondary | ICD-10-CM

## 2023-12-26 DIAGNOSIS — M25612 Stiffness of left shoulder, not elsewhere classified: Secondary | ICD-10-CM

## 2023-12-26 DIAGNOSIS — Z170 Estrogen receptor positive status [ER+]: Secondary | ICD-10-CM

## 2023-12-26 DIAGNOSIS — R293 Abnormal posture: Secondary | ICD-10-CM

## 2023-12-26 DIAGNOSIS — I890 Lymphedema, not elsewhere classified: Secondary | ICD-10-CM | POA: Diagnosis not present

## 2023-12-26 NOTE — Therapy (Signed)
 OUTPATIENT PHYSICAL THERAPY BREAST CANCER TREATMENT   Patient Name: Norma [name] MRN: [id number] DOB:Sep 17, 1970, 53 y.o., female Today's Date: 12/26/2023  END OF SESSION:  PT End of Session - 12/26/23 1558     Visit Number 28    Number of Visits 32    Date for Recertification  01/08/24    PT Start Time 1555    PT Stop Time 1650    PT Time Calculation (min) 55 min    Activity Tolerance Patient tolerated treatment well    Behavior During Therapy [facility] for tasks assessed/performed             Past Medical History:  Diagnosis Date   Allergy    Anemia    hx of   Anxiety    Arthritis    KNEES   Asthma    treated for during COVID tx   Cancer (HCC) 06/2023   left breast IDC   Chronic headache    Colon polyps    TUBULAR ADENOMAS AND HYPERPLASTIC    Complication of anesthesia    DDD (degenerative disc disease), thoracic    Depression    Diabetes mellitus, type 2 (HCC)    Difficult airway for intubation    Fibromyalgia    GERD (gastroesophageal reflux disease)    Goiter    Hyperlipidemia    Hypertension    Low back pain    Obesity    PONV (postoperative nausea and vomiting)    RA (rheumatoid arthritis) (HCC)    Seasonal allergies    Vitamin D  deficiency    Past Surgical History:  Procedure Laterality Date   ABDOMINAL HYSTERECTOMY  2008   BACK SURGERY  2005   lumbar laminectomy   BALLOON DILATION N/A 06/01/2021   Procedure: BALLOON DILATION;  Surgeon: Albertus Gordy HERO, MD;  Location: [facility];  Service: Gastroenterology;  Laterality: N/A;   BARTHOLIN GLAND CYST EXCISION     BIOPSY  06/01/2021   Procedure: BIOPSY;  Surgeon: Albertus Gordy HERO, MD;  Location: [facility];  Service: Gastroenterology;;   BREAST LUMPECTOMY WITH RADIOACTIVE SEED AND SENTINEL LYMPH NODE BIOPSY Left 07/02/2023   Procedure: BREAST LUMPECTOMY WITH RADIOACTIVE SEED AND SENTINEL LYMPH NODE BIOPSY;  Surgeon: Aron Shoulders, MD;  Location: [facility];  Service: General;   Laterality: Left;  90 MIN 78444- EXCISION LEFT CHEST WALL MASS GEN COMBINED WITH REGIONAL   COLONOSCOPY  2017   JMP-MAC-prep good-TA -recall 5 yr   COLONOSCOPY WITH PROPOFOL  N/A 06/01/2021   Procedure: COLONOSCOPY WITH PROPOFOL ;  Surgeon: Albertus Gordy HERO, MD;  Location: [facility];  Service: Gastroenterology;  Laterality: N/A;   ESOPHAGOGASTRODUODENOSCOPY (EGD) WITH PROPOFOL  N/A 06/01/2021   Procedure: ESOPHAGOGASTRODUODENOSCOPY (EGD) WITH PROPOFOL ;  Surgeon: Albertus Gordy HERO, MD;  Location: [facility];  Service: Gastroenterology;  Laterality: N/A;   LUMBAR LAMINECTOMY/DECOMPRESSION MICRODISCECTOMY Right 07/17/2013   Procedure: LUMBAR LAMINECTOMY/DECOMPRESSION MICRODISCECTOMY 1 LEVEL,RIGHT LUMBAR FOUR-FIVE;  Surgeon: Darina MALVA Boehringer, MD;  Location: [facility];  Service: Neurosurgery;  Laterality: Right;  Right   MASS EXCISION Left 07/02/2023   Procedure: EXCISION, MASS, CHEST WALL;  Surgeon: Aron Shoulders, MD;  Location: [facility];  Service: General;  Laterality: Left;   PARTIAL HYSTERECTOMY     POLYPECTOMY  2017   TA and benign polypoid   POLYPECTOMY  06/01/2021   Procedure: POLYPECTOMY;  Surgeon: Albertus Gordy HERO, MD;  Location: [facility];  Service: Gastroenterology;;   WISDOM TOOTH EXTRACTION     Patient Active Problem List  Diagnosis Date Noted   Genetic testing 06/23/2023   Malignant neoplasm of upper-outer quadrant of left breast in female, estrogen receptor positive (HCC) 06/11/2023   Gastritis without bleeding    Esophageal dysphagia    Schatzki's ring    Benign neoplasm of ascending colon    Class 3 severe obesity with serious comorbidity and body mass index (BMI) of 40.0 to 44.9 in adult (HCC) 08/25/2019   Type 2 diabetes mellitus without complication, without long-term current use of insulin (HCC) 08/25/2019   Chronic cough 06/27/2017   LUQ abdominal pain 12/07/2014   RUQ abdominal pain 11/10/2013   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   Gastroesophageal  reflux disease 12/18/2012   Hx of adenomatous colonic polyps 12/18/2012   Chronic RLQ pain 12/18/2012   IBS (irritable bowel syndrome) 01/22/2011   HTN (hypertension) 01/22/2011   Hyperlipidemia 01/22/2011   Anxiety and depression 01/22/2011    PCP: Haze Servant, NP  REFERRING PROVIDER: Dr. Jina Nephew   REFERRING DIAG: Left breast cancer  THERAPY DIAG:  Lymphedema, not elsewhere classified  Stiffness of left shoulder, not elsewhere classified  Aftercare following surgery for neoplasm  Abnormal posture  Malignant neoplasm of upper-outer quadrant of left breast in female, estrogen receptor positive (HCC)  Rationale for Evaluation and Treatment: Rehabilitation  ONSET DATE: 04/26/23  SUBJECTIVE:                                                                                                                                                                                           SUBJECTIVE STATEMENT: My blood sugar keeps going low. I think I may need to replace the monitor but I am not sure that is the problem. My breast was hurting on the way over but overall I think something is working.   PERTINENT HISTORY:  Patient was diagnosed on 04/26/2023 with left grade 3 invasive ductal carcinoma breast cancer. It measures 1 cm and is located in the upper outer quadrant. It is ER/PR positive and HER2 negative with a Ki67 of 30%. Pt underwent a L lumpectomy and SLNB 0/2 on 07/02/23  PATIENT GOALS:  Reassess how my recovery is going related to arm function, pain, and swelling.  PAIN:  Are you having pain?  Pain in L breast, 1/10, dull pain, not touching it makes it better  PRECAUTIONS: Recent Surgery, left UE Lymphedema risk,   RED FLAGS: None   ACTIVITY LEVEL / LEISURE: doing post op exercises   OBJECTIVE:   PATIENT SURVEYS:  QUICK DASH:     OBSERVATIONS: Healing scars with some increased scar tissue at L axilla  POSTURE:  Forward head and rounded shoulders  posture    UPPER EXTREMITY AROM/PROM:   A/PROM RIGHT   eval    Shoulder extension 48  Shoulder flexion 153  Shoulder abduction 165  Shoulder internal rotation 63  Shoulder external rotation 84                          (Blank rows = not tested)   A/PROM LEFT   eval LEFT  07/25/23 LEFT 08/08/23 LEFT 10/01/23  Shoulder extension 35 70    Shoulder flexion 150 162 171 176  Shoulder abduction 167 156 151 163  Shoulder internal rotation 63 69    Shoulder external rotation 75 80                            (Blank rows = not tested)   CERVICAL AROM: All within normal limits   UPPER EXTREMITY STRENGTH: WFL   LYMPHEDEMA ASSESSMENTS (in cm):    LANDMARK RIGHT   eval  10 cm proximal to olecranon process 38.8  Olecranon process 28.7  10 cm proximal to ulnar styloid process 25.8  Just proximal to ulnar styloid process 17  Across hand at thumb web space 20.5  At base of 2nd digit 5.9  (Blank rows = not tested)   LANDMARK LEFT   eval LEFT 07/25/23  10 cm proximal to olecranon process 40 38.4  Olecranon process 28.8 29.5  10 cm proximal to ulnar styloid process 24.3 23  Just proximal to ulnar styloid process 16.3 16.5  Across hand at thumb web space 19.5 20  At base of 2nd digit 5.9 5.8  (Blank rows = not tested)  Surgery type/Date: 07/02/23 L breast lumpectomy and SLNB Number of lymph nodes removed: 0/2 Current/past treatment (chemo, radiation, hormone therapy): does not need chemo, will require radiation, will need hormone therapy Other symptoms:  Heaviness/tightness Yes Pain Yes Pitting edema No Infections No Decreased scar mobility Yes Stemmer sign No     TREATMENT PERFORMED: 12/26/23 Manual Therapy: MLD: In supine: Short neck, 5 diaphragmatic breaths, R axillary nodes and establishment of interaxillary pathway, L inguinal nodes and establishment of axilloinguinal pathway, then L breast moving fluid towards pathways then to R sidelying working on L lateral trunk moving fluid  towards pathway at side and to posterior inter axillary pathway spending extra time in any areas of fibrosis at lateral breast then retracing all steps in supine.  STM to area of seroma at lateral breast and area of fibrosis at lateral inferior breast   12/24/23 Manual Therapy: MLD: In supine: Short neck, 5 diaphragmatic breaths, R axillary nodes and establishment of interaxillary pathway, L inguinal nodes and establishment of axilloinguinal pathway, then L breast moving fluid towards pathways then to R sidelying working on L lateral trunk moving fluid towards pathway at side and to posterior inter axillary pathway spending extra time in any areas of fibrosis at lateral breast then retracing all steps in supine.  STM to area of seroma at lateral breast - area felt softer today  12/18/23: Manual Therapy: Discussed taking the swell spot out of the bra if she has any discomfort or to readjust throughout the day MLD: In supine: Short neck, 5 diaphragmatic breaths, R axillary nodes and establishment of interaxillary pathway, L inguinal nodes and establishment of axilloinguinal pathway, then L breast moving fluid towards pathways spending extra time in any areas of fibrosis at lateral breast then retracing all steps. Spent increased time  at medial and lateral breast where she had increased discomfort.   12/16/23: Manual Therapy: Discussed how to wear the swell spot in the bra and how it is ok to sleep in it STM to area of seroma and fibrosis just lateral to areola with pitting edema noted in this area MLD: In supine: Short neck, 5 diaphragmatic breaths, R axillary nodes and establishment of interaxillary pathway, L inguinal nodes and establishment of axilloinguinal pathway, then L breast moving fluid towards pathways spending extra time in any areas of fibrosis at lateral breast then retracing all steps   12/11/23: Manual Therapy: Discussed how a seroma is different than lymphedema and the importnace of  increasing compression to help the seroma heal Created foam chip pack and placed in thick stockinette for pt to wear in her bra for additional compression STM to area of seroma and fibrosis just lateral to areola with pitting edema noted in this area MLD: In supine: Short neck, 5 diaphragmatic breaths, R axillary nodes and establishment of interaxillary pathway, L inguinal nodes and establishment of axilloinguinal pathway, then L breast moving fluid towards pathways spending extra time in any areas of fibrosis at lateral breast then retracing all steps  Issued info for pt to obtain a swell spot for her breast to help decrease lymphedema and heal seroma  11/26/23: Manual Therapy: In supine: Short neck, 5 diaphragmatic breaths, R axillary nodes and establishment of interaxillary pathway, L inguinal nodes and establishment of axilloinguinal pathway, then L breast moving fluid towards pathways spending extra time in any areas of fibrosis at lateral breast then retracing all steps  STM In R sidelying to L serratus and lateral breast in area of tightness/fibrosis with pt reporting increased tenderness today then back to supine to complete MLD  11/21/23: Manual Therapy: In supine: Short neck, 5 diaphragmatic breaths, R axillary nodes and establishment of interaxillary pathway, L inguinal nodes and establishment of axilloinguinal pathway, then L breast moving fluid towards pathways spending extra time in any areas of fibrosis then retracing all steps then L UE working proximal to distal with increased time spent at axilla and upper arm then retracing all steps.  MFR to L axilla STM using cocoa butter and WAVE tool In R sidelying to L serratus and lateral breast in area of tightness/fibrosis with good improvement noted by end of session  11/19/23: Manual Therapy: In supine: Short neck, 5 diaphragmatic breaths, R axillary nodes and establishment of interaxillary pathway, L inguinal nodes and establishment of  axilloinguinal pathway, then L breast moving fluid towards pathways spending extra time in any areas of fibrosis then retracing all steps then L UE working proximal to distal with increased time spent at axilla and upper arm then retracing all steps.  STM using cocoa butter In R sidelying to L serratus and lateral breast in area of tightness/fibrosis with good improvement noted by end of session  11/14/23: Manual Therapy: In supine: Short neck, 5 diaphragmatic breaths, R axillary nodes and establishment of interaxillary pathway, L inguinal nodes and establishment of axilloinguinal pathway, then L breast moving fluid towards pathways spending extra time in any areas of fibrosis then retracing all steps then L UE working proximal to distal with increased time spent at axilla and upper arm then retracing all steps.  STM using cocoa butter In R sidelying to L serratus and lateral breast in area of tightness/fibrosis with good improvement noted by end of session    The patient was assessed using the L-Dex machine today to  produce a lymphedema index baseline score. The patient will be reassessed on a regular basis (typically every 3 months) to obtain new L-Dex scores. If the score is > 6.5 points away from his/her baseline score indicating onset of subclinical lymphedema, it will be recommended to wear a compression garment for 4 weeks, 12 hours per day and then be reassessed. If the score continues to be > 6.5 points from baseline at reassessment, we will initiate lymphedema treatment. Assessing in this manner has a 95% rate of preventing clinically significant lymphedema.   11/12/23: Manual Therapy: In supine: Short neck, 5 diaphragmatic breaths, R axillary nodes and establishment of interaxillary pathway, L inguinal nodes and establishment of axilloinguinal pathway, then L breast moving fluid towards pathways spending extra time in any areas of fibrosis then retracing all steps then L UE working proximal to  distal with increased time spent at axilla and upper arm then retracing all steps.  MFR to area of cording with UE in to abduction with cording more difficult to palpate but definite areas of tightness In R sidelying to L serratus and lateral breast in area of tightness/fibrosis  11/07/23: Manual Therapy: In supine: Short neck, 5 diaphragmatic breaths, R axillary nodes and establishment of interaxillary pathway, L inguinal nodes and establishment of axilloinguinal pathway, then L breast moving fluid towards pathways spending extra time in any areas of fibrosis then retracing all steps then L UE working proximal to distal with increased time spent at axilla and upper arm then retracing all steps.  MFR to area of cording with UE in to abduction with cording more difficult to palpate but definite areas of tightness  11/05/23: Manual Therapy: In supine: Short neck, 5 diaphragmatic breaths, R axillary nodes and establishment of interaxillary pathway, L inguinal nodes and establishment of axilloinguinal pathway, then L breast moving fluid towards pathways spending extra time in any areas of fibrosis then retracing all steps then L UE working proximal to distal with increased time spent at axilla and upper arm then retracing all steps.  MFR to area of cording with UE in to abduction with cording more difficult to palpate but definite areas of tightness  10/31/23: Manual Therapy: In supine: Short neck, superficial and deep abdominals, Rt axillary and pectoral nodes, anterior intact thorax sequence, establishment of anterior inter-axillary pathway, Lt inguinal nodes and establishment of Lt axillo-inguinal pathway, then L breast moving fluid towards pathways spending extra time in any areas of fibrosis, then into Rt S/L for focus to fibrosis in lateral breast and found what felt like edges of a seroma near areolar, then finished retracing all steps in supine. Pt reports area that felt like seroma was where she  had increased pain over the weekend to the point she took her compression bra off because she like her breast was trying to stretch out of it.  MFR to area of cording found at last session but hard to palpate cords today, also focused on area at Lt lateral trunk that pt reports felt like a cord over past few days and would feel better when she pushed on it. Found area of tightness that may or may not have been cord, but definitely tight regardless P/ROM to Lt shoulder into flex, abd and D2 with scap depression during for varying stretches for cording tightness  10/29/23: Manual Therapy: In supine: Short neck, 5 diaphragmatic breaths, R axillary nodes and establishment of interaxillary pathway, L inguinal nodes and establishment of axilloinguinal pathway, then L breast moving fluid towards  pathways spending extra time in any areas of fibrosis then retracing all steps. MFR to numerous cords in L axilla extending down to upper arm with L UE in 100 degrees of abduction 10/24/23: Manual Therapy: Instructed pt in each step of MLD and had her return demonstrate each step with verbal and tactile cues for speed, pressure, direction of stretch and skin stretch technique. Pt completed first half with cueing and therapist completed second half. MLD to Lt breast and upper arm in supine: Short neck, superficial and deep abdominals, Rt axillary and pectoral nodes and establishment of anterior inter-axillary pathway, Lt inguinal nodes and establishment of axillo-inguinal pathway, then Lt breast moving fluid towards pathways and L axilla spending extra time in any areas of fibrosis then LUE working proximal to distal and retracing all steps   10/21/23: Manual Therapy: MLD to Lt breast and upper arm in supine: Short neck, superficial and deep abdominals, Rt axillary and pectoral nodes and establishment of anterior inter-axillary pathway, Lt inguinal nodes and establishment of axillo-inguinal pathway, then Lt breast moving  fluid towards pathways and L axilla spending extra time in any areas of fibrosis then LUE working proximal to distal and retracing all steps  STM when in Rt sidelying to Lt serratus and lateral trunk, also during P/ROM to same P/ROM to Lt shoulder into flex, abd and D2 with scapular depression by therapist throughout  10/17/23: Manual Therapy: MLD to Lt breast and upper arm in supine: Short neck, superficial and deep abdominals, Rt axillary and pectoral nodes and establishment of anterior inter-axillary pathway, Lt inguinal nodes and establishment of axillo-inguinal pathway, then Lt breast moving fluid towards pathways and L axilla spending extra time in any areas of fibrosis then LUE working proximal to distal and retracing all steps  STM when in Rt sidelying to Lt serratus and lateral trunk Issued instructions for UE MLD  10/15/23: Manual Therapy: MLD to Lt breast and upper arm in supine: Short neck, superficial and deep abdominals, Rt axillary and pectoral nodes and establishment of anterior inter-axillary pathway, Lt inguinal nodes and establishment of axillo-inguinal pathway, then Lt breast moving fluid towards pathways and L axilla spending extra time in any areas of fibrosis then LUE working proximal to distal and retracing all steps  STM when in Rt sidelying to Lt serratus Educated pt to wear her compression sleeve    PATIENT EDUCATION:  Wear compression bra 12 hrs/day for 4 weeks during day time hours Access Code: [email or web address] URL: [email or web address] Date: 08/06/2023 Prepared by: Grayce Sheldon  Exercises - Single Arm Doorway Pec Stretch at 90 Degrees Abduction  - 2 x daily - 7 x weekly - 1 sets - 3 reps - 20-30 hold - Supine Lower Trunk Rotation  - 1 x daily - 7 x weekly - 1 sets - 3 reps - 20-30 hold Education details: nerve desensitization, supine dowel exercises, lymphedema risk reduction and importance of skin care Person educated: Patient Education method:  Explanation, Demonstration, Tactile cues, and Handouts Education comprehension: verbalized understanding and returned demonstration  HOME EXERCISE PROGRAM: Reviewed previously given post op HEP. Supine dowel exercises in to abduction and flexion  ASSESSMENT:  CLINICAL IMPRESSION: Pt had improvement of fibrosis at medial breast today. She now has the flexitouch that she can use at home and it was set up for her today. She still has fibrosis at lateral breast so spent time in this area today. Her pain is improving.   Pt will benefit from skilled therapeutic intervention to improve on  the following deficits: Decreased knowledge of precautions, impaired UE functional use, pain, decreased ROM, postural dysfunction, increased edema.   PT treatment/interventions: ADL/Self care home management, [phone]- PT Re-evaluation, 97110-Therapeutic exercises, 97530- Therapeutic activity, [id number]- Neuromuscular re-education, 97535- Self Care, 02859- Manual therapy, [id number]- Orthotic Initial, and [id number]- Orthotic/Prosthetic subsequent   GOALS: Goals reviewed with patient? Yes  LONG TERM GOALS:  (STG=LTG)  GOALS Name Target Date  Goal status  1 Pt will demonstrate she has regained full shoulder ROM and function post operatively compared to baselines.  Baseline: 08/22/23 MET  2 Pt will report no pain at end range of L shoulder abduction to allow improved function. 08/22/23 MET  3 Pt will be independent in a home exercise program for continued stretching and strengthening.  08/22/23 MET  4 Pt will be independent in self MLD for long term management of L breast lymphedema and swelling in L axilla to decrease risk of infection. 10/29/23 MET 10/29/23  5 Pt will report a 50% improvement in pain in L breast to allow improved comfort.  11/26/23 11/26/23- ONGOING Varies - some days the pain is much better but then worsens   6 Pt will return to the green on the SOZO demonstrating reversal of subclinical lymphedema.  11/26/23 MET  11/14/23 returned to green  7 Pt will demonstrate a 75% improvement in pain and discomfort of L axilla from cording to allow improved comfort and function 11/26/23 MET 11/26/23  8 Pt will obtain a swell spot for additional compression in her compression bra. 01/08/24 NEW     PLAN:  PT FREQUENCY/DURATION: 2x/wk for 4 wks  PLAN FOR NEXT SESSION: Cont MLD to L breast and STM to seroma  [facility]  [address], [address]  [place] [address]  [phone]  Self manual lymph drainage: Do circles behind the collar bones x 10  Perform this sequence once a day.  Only give enough pressure no your skin to make the skin move.  Diaphragmatic - Supine   Inhale through nose making navel move out toward hands. Exhale through puckered lips, hands follow navel in. Repeat _5__ times. Rest _10__ seconds between repeats.   Copyright  VHI. All rights reserved.  Hug yourself.  Do circles at your neck just above your collarbones.  Repeat this 10 times.  Axilla - One at a Time   Using full weight of flat hand and fingers at center of uninvolved (R) armpit, make _10__ in-place circles.   Copyright  VHI. All rights reserved.  LEG: Inguinal Nodes Stimulation   With small finger side of hand against hip crease on involved (L) side, gently perform circles at the crease. Repeat __10_ times.   Copyright  VHI. All rights reserved.  Axilla to Inguinal Nodes - Sweep   On involved side, stretch skin from armpit along side of trunk to hip crease.  Now gently stretch skin from the involved side to the uninvolved side across the chest at the shoulder line.    Move fluid from L armpit in area that feels swollen towards these 2 pathways by gently stretching skin.   Finish by doing circles in R armpit and L groin.   Finish by doing the pathways as described above going from your involved armpit to the same side groin and going across your upper chest from the involved shoulder  to the uninvolved shoulder.  Repeat the steps above where you do circles in your left groin and right armpit. Copyright  VHI. All  rights reserved.    [facility], PT 12/26/2023, 4:55 PM

## 2023-12-26 NOTE — Progress Notes (Signed)
 Subjective:   Patient ID: Norma [name], female   DOB: 53 y.o.   MRN: [id number]   HPI Patient presents with ingrown toenail deformities hallux both feet with the right being worse than the left.  States that she has tried to trim it and soak it without relief of symptoms and gradually becoming more of an issue for her.  Patient does not smoke likes to be active   Review of Systems  All other systems reviewed and are negative.       Objective:  Physical Exam Vitals and nursing note reviewed.  Constitutional:      Appearance: She is well-developed.  Pulmonary:     Effort: Pulmonary effort is normal.  Musculoskeletal:        General: Normal range of motion.  Skin:    General: Skin is warm.  Neurological:     Mental Status: She is alert.     Neurovascular status intact muscle strength adequate range of motion adequate with incurvated medial borders of both big toes with the right more symptomatic than the left with mild redness no active drainage noted.  Patient has good digital perfusion well-oriented x 3     Assessment:  Chronic ingrown toenail deformity with pain hallux bilateral right bothering more than left     Plan:  H&P reviewed condition recommended correction of nail border explained procedure and risk the patient wants surgery signing consent form after extensive review.  Today I infiltrated the right big toe 60 mg Xylocaine  Marcaine  mixture sterile prep done using sterile instrumentation removed the border exposed matrix applied phenol 3 applications 30 seconds followed by alcohol lavage sterile dressing gave instructions on soaks wear dressing 24 hours take it off earlier if throbbing were to occur and encouraged to call with questions concerns which may arise

## 2023-12-27 ENCOUNTER — Encounter: Attending: Nurse Practitioner | Admitting: Dietician

## 2023-12-27 DIAGNOSIS — Z7984 Long term (current) use of oral hypoglycemic drugs: Secondary | ICD-10-CM | POA: Diagnosis present

## 2023-12-27 DIAGNOSIS — E119 Type 2 diabetes mellitus without complications: Secondary | ICD-10-CM | POA: Diagnosis present

## 2023-12-27 DIAGNOSIS — I10 Essential (primary) hypertension: Secondary | ICD-10-CM | POA: Diagnosis present

## 2023-12-27 NOTE — Patient Instructions (Signed)
 Aim for consistent carbohydrate intake at meal sand snacks

## 2023-12-30 ENCOUNTER — Encounter: Payer: Self-pay | Admitting: Physical Therapy

## 2023-12-30 ENCOUNTER — Ambulatory Visit: Payer: Self-pay | Admitting: Physical Therapy

## 2023-12-30 ENCOUNTER — Telehealth: Payer: Self-pay

## 2023-12-30 DIAGNOSIS — M25612 Stiffness of left shoulder, not elsewhere classified: Secondary | ICD-10-CM

## 2023-12-30 DIAGNOSIS — I890 Lymphedema, not elsewhere classified: Secondary | ICD-10-CM

## 2023-12-30 DIAGNOSIS — Z483 Aftercare following surgery for neoplasm: Secondary | ICD-10-CM

## 2023-12-30 DIAGNOSIS — Z170 Estrogen receptor positive status [ER+]: Secondary | ICD-10-CM

## 2023-12-30 DIAGNOSIS — R293 Abnormal posture: Secondary | ICD-10-CM

## 2023-12-30 NOTE — Telephone Encounter (Signed)
 Notified the pt regarding her FMLA forms being,completed,faxed,and confirmation received. Pt copy was emailed upon request.

## 2023-12-30 NOTE — Therapy (Signed)
 OUTPATIENT PHYSICAL THERAPY BREAST CANCER TREATMENT   Patient Name: Norma [name] MRN: [id number] DOB:09-25-1970, 53 y.o., female Today's Date: 12/30/2023  END OF SESSION:  PT End of Session - 12/30/23 1604     Visit Number 29    Number of Visits 32    Date for Recertification  01/08/24    PT Start Time 1604    PT Stop Time 1654    PT Time Calculation (min) 50 min    Activity Tolerance Patient tolerated treatment well    Behavior During Therapy [facility] for tasks assessed/performed             Past Medical History:  Diagnosis Date   Allergy    Anemia    hx of   Anxiety    Arthritis    KNEES   Asthma    treated for during COVID tx   Cancer (HCC) 06/2023   left breast IDC   Chronic headache    Colon polyps    TUBULAR ADENOMAS AND HYPERPLASTIC    Complication of anesthesia    DDD (degenerative disc disease), thoracic    Depression    Diabetes mellitus, type 2 (HCC)    Difficult airway for intubation    Fibromyalgia    GERD (gastroesophageal reflux disease)    Goiter    Hyperlipidemia    Hypertension    Low back pain    Obesity    PONV (postoperative nausea and vomiting)    RA (rheumatoid arthritis) (HCC)    Seasonal allergies    Vitamin D  deficiency    Past Surgical History:  Procedure Laterality Date   ABDOMINAL HYSTERECTOMY  2008   BACK SURGERY  2005   lumbar laminectomy   BALLOON DILATION N/A 06/01/2021   Procedure: BALLOON DILATION;  Surgeon: Albertus Gordy HERO, MD;  Location: [facility];  Service: Gastroenterology;  Laterality: N/A;   BARTHOLIN GLAND CYST EXCISION     BIOPSY  06/01/2021   Procedure: BIOPSY;  Surgeon: Albertus Gordy HERO, MD;  Location: [facility];  Service: Gastroenterology;;   BREAST LUMPECTOMY WITH RADIOACTIVE SEED AND SENTINEL LYMPH NODE BIOPSY Left 07/02/2023   Procedure: BREAST LUMPECTOMY WITH RADIOACTIVE SEED AND SENTINEL LYMPH NODE BIOPSY;  Surgeon: Aron Shoulders, MD;  Location: [facility];  Service: General;   Laterality: Left;  90 MIN 78444- EXCISION LEFT CHEST WALL MASS GEN COMBINED WITH REGIONAL   COLONOSCOPY  2017   JMP-MAC-prep good-TA -recall 5 yr   COLONOSCOPY WITH PROPOFOL  N/A 06/01/2021   Procedure: COLONOSCOPY WITH PROPOFOL ;  Surgeon: Albertus Gordy HERO, MD;  Location: [facility];  Service: Gastroenterology;  Laterality: N/A;   ESOPHAGOGASTRODUODENOSCOPY (EGD) WITH PROPOFOL  N/A 06/01/2021   Procedure: ESOPHAGOGASTRODUODENOSCOPY (EGD) WITH PROPOFOL ;  Surgeon: Albertus Gordy HERO, MD;  Location: [facility];  Service: Gastroenterology;  Laterality: N/A;   LUMBAR LAMINECTOMY/DECOMPRESSION MICRODISCECTOMY Right 07/17/2013   Procedure: LUMBAR LAMINECTOMY/DECOMPRESSION MICRODISCECTOMY 1 LEVEL,RIGHT LUMBAR FOUR-FIVE;  Surgeon: Darina MALVA Boehringer, MD;  Location: [facility];  Service: Neurosurgery;  Laterality: Right;  Right   MASS EXCISION Left 07/02/2023   Procedure: EXCISION, MASS, CHEST WALL;  Surgeon: Aron Shoulders, MD;  Location: [facility];  Service: General;  Laterality: Left;   PARTIAL HYSTERECTOMY     POLYPECTOMY  2017   TA and benign polypoid   POLYPECTOMY  06/01/2021   Procedure: POLYPECTOMY;  Surgeon: Albertus Gordy HERO, MD;  Location: [facility];  Service: Gastroenterology;;   WISDOM TOOTH EXTRACTION     Patient Active Problem List  Diagnosis Date Noted   Genetic testing 06/23/2023   Malignant neoplasm of upper-outer quadrant of left breast in female, estrogen receptor positive (HCC) 06/11/2023   Gastritis without bleeding    Esophageal dysphagia    Schatzki's ring    Benign neoplasm of ascending colon    Class 3 severe obesity with serious comorbidity and body mass index (BMI) of 40.0 to 44.9 in adult (HCC) 08/25/2019   Type 2 diabetes mellitus without complication, without long-term current use of insulin (HCC) 08/25/2019   Chronic cough 06/27/2017   LUQ abdominal pain 12/07/2014   RUQ abdominal pain 11/10/2013   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   Gastroesophageal  reflux disease 12/18/2012   Hx of adenomatous colonic polyps 12/18/2012   Chronic RLQ pain 12/18/2012   IBS (irritable bowel syndrome) 01/22/2011   HTN (hypertension) 01/22/2011   Hyperlipidemia 01/22/2011   Anxiety and depression 01/22/2011    PCP: Haze Servant, NP  REFERRING PROVIDER: Dr. Jina Nephew   REFERRING DIAG: Left breast cancer  THERAPY DIAG:  Lymphedema, not elsewhere classified  Stiffness of left shoulder, not elsewhere classified  Aftercare following surgery for neoplasm  Abnormal posture  Malignant neoplasm of upper-outer quadrant of left breast in female, estrogen receptor positive (HCC)  Rationale for Evaluation and Treatment: Rehabilitation  ONSET DATE: 04/26/23  SUBJECTIVE:                                                                                                                                                                                           SUBJECTIVE STATEMENT: I had some discomfort today. I had to adjust the swell spot. It seems to help when I adjust it. I have been using the compression pump over the weekend.   PERTINENT HISTORY:  Patient was diagnosed on 04/26/2023 with left grade 3 invasive ductal carcinoma breast cancer. It measures 1 cm and is located in the upper outer quadrant. It is ER/PR positive and HER2 negative with a Ki67 of 30%. Pt underwent a L lumpectomy and SLNB 0/2 on 07/02/23  PATIENT GOALS:  Reassess how my recovery is going related to arm function, pain, and swelling.  PAIN:  Are you having pain?  Pain in L breast, 2/10, dull pain, not touching it makes it better  PRECAUTIONS: Recent Surgery, left UE Lymphedema risk,   RED FLAGS: None   ACTIVITY LEVEL / LEISURE: doing post op exercises   OBJECTIVE:   PATIENT SURVEYS:  QUICK DASH:     OBSERVATIONS: Healing scars with some increased scar tissue at L axilla  POSTURE:  Forward head and rounded shoulders posture   UPPER EXTREMITY AROM/PROM:   A/PROM  RIGHT   eval    Shoulder extension 48  Shoulder flexion 153  Shoulder abduction 165  Shoulder internal rotation 63  Shoulder external rotation 84                          (Blank rows = not tested)   A/PROM LEFT   eval LEFT  07/25/23 LEFT 08/08/23 LEFT 10/01/23  Shoulder extension 35 70    Shoulder flexion 150 162 171 176  Shoulder abduction 167 156 151 163  Shoulder internal rotation 63 69    Shoulder external rotation 75 80                            (Blank rows = not tested)   CERVICAL AROM: All within normal limits   UPPER EXTREMITY STRENGTH: WFL   LYMPHEDEMA ASSESSMENTS (in cm):    LANDMARK RIGHT   eval  10 cm proximal to olecranon process 38.8  Olecranon process 28.7  10 cm proximal to ulnar styloid process 25.8  Just proximal to ulnar styloid process 17  Across hand at thumb web space 20.5  At base of 2nd digit 5.9  (Blank rows = not tested)   LANDMARK LEFT   eval LEFT 07/25/23  10 cm proximal to olecranon process 40 38.4  Olecranon process 28.8 29.5  10 cm proximal to ulnar styloid process 24.3 23  Just proximal to ulnar styloid process 16.3 16.5  Across hand at thumb web space 19.5 20  At base of 2nd digit 5.9 5.8  (Blank rows = not tested)  Surgery type/Date: 07/02/23 L breast lumpectomy and SLNB Number of lymph nodes removed: 0/2 Current/past treatment (chemo, radiation, hormone therapy): does not need chemo, will require radiation, will need hormone therapy Other symptoms:  Heaviness/tightness Yes Pain Yes Pitting edema No Infections No Decreased scar mobility Yes Stemmer sign No     TREATMENT PERFORMED: 12/30/23 Manual Therapy: MLD: In supine: Short neck, 5 diaphragmatic breaths, R axillary nodes and establishment of interaxillary pathway, L inguinal nodes and establishment of axilloinguinal pathway, then L breast moving fluid towards pathways then to R sidelying working on L lateral trunk moving fluid towards pathway at side and to posterior  inter axillary pathway spending extra time in any areas of fibrosis at lateral breast then retracing all steps in supine.  STM to area of seroma at lateral breast and area of fibrosis at lateral inferior breast  12/26/23 Manual Therapy: MLD: In supine: Short neck, 5 diaphragmatic breaths, R axillary nodes and establishment of interaxillary pathway, L inguinal nodes and establishment of axilloinguinal pathway, then L breast moving fluid towards pathways then to R sidelying working on L lateral trunk moving fluid towards pathway at side and to posterior inter axillary pathway spending extra time in any areas of fibrosis at lateral breast then retracing all steps in supine.  STM to area of seroma at lateral breast and area of fibrosis at lateral inferior breast   12/24/23 Manual Therapy: MLD: In supine: Short neck, 5 diaphragmatic breaths, R axillary nodes and establishment of interaxillary pathway, L inguinal nodes and establishment of axilloinguinal pathway, then L breast moving fluid towards pathways then to R sidelying working on L lateral trunk moving fluid towards pathway at side and to posterior inter axillary pathway spending extra time in any areas of fibrosis at lateral breast then retracing all steps in supine.  STM to area of seroma at  lateral breast - area felt softer today  12/18/23: Manual Therapy: Discussed taking the swell spot out of the bra if she has any discomfort or to readjust throughout the day MLD: In supine: Short neck, 5 diaphragmatic breaths, R axillary nodes and establishment of interaxillary pathway, L inguinal nodes and establishment of axilloinguinal pathway, then L breast moving fluid towards pathways spending extra time in any areas of fibrosis at lateral breast then retracing all steps. Spent increased time at medial and lateral breast where she had increased discomfort.   12/16/23: Manual Therapy: Discussed how to wear the swell spot in the bra and how it is ok to  sleep in it STM to area of seroma and fibrosis just lateral to areola with pitting edema noted in this area MLD: In supine: Short neck, 5 diaphragmatic breaths, R axillary nodes and establishment of interaxillary pathway, L inguinal nodes and establishment of axilloinguinal pathway, then L breast moving fluid towards pathways spending extra time in any areas of fibrosis at lateral breast then retracing all steps   12/11/23: Manual Therapy: Discussed how a seroma is different than lymphedema and the importnace of increasing compression to help the seroma heal Created foam chip pack and placed in thick stockinette for pt to wear in her bra for additional compression STM to area of seroma and fibrosis just lateral to areola with pitting edema noted in this area MLD: In supine: Short neck, 5 diaphragmatic breaths, R axillary nodes and establishment of interaxillary pathway, L inguinal nodes and establishment of axilloinguinal pathway, then L breast moving fluid towards pathways spending extra time in any areas of fibrosis at lateral breast then retracing all steps  Issued info for pt to obtain a swell spot for her breast to help decrease lymphedema and heal seroma  11/26/23: Manual Therapy: In supine: Short neck, 5 diaphragmatic breaths, R axillary nodes and establishment of interaxillary pathway, L inguinal nodes and establishment of axilloinguinal pathway, then L breast moving fluid towards pathways spending extra time in any areas of fibrosis at lateral breast then retracing all steps  STM In R sidelying to L serratus and lateral breast in area of tightness/fibrosis with pt reporting increased tenderness today then back to supine to complete MLD  11/21/23: Manual Therapy: In supine: Short neck, 5 diaphragmatic breaths, R axillary nodes and establishment of interaxillary pathway, L inguinal nodes and establishment of axilloinguinal pathway, then L breast moving fluid towards pathways spending extra  time in any areas of fibrosis then retracing all steps then L UE working proximal to distal with increased time spent at axilla and upper arm then retracing all steps.  MFR to L axilla STM using cocoa butter and WAVE tool In R sidelying to L serratus and lateral breast in area of tightness/fibrosis with good improvement noted by end of session  11/19/23: Manual Therapy: In supine: Short neck, 5 diaphragmatic breaths, R axillary nodes and establishment of interaxillary pathway, L inguinal nodes and establishment of axilloinguinal pathway, then L breast moving fluid towards pathways spending extra time in any areas of fibrosis then retracing all steps then L UE working proximal to distal with increased time spent at axilla and upper arm then retracing all steps.  STM using cocoa butter In R sidelying to L serratus and lateral breast in area of tightness/fibrosis with good improvement noted by end of session  11/14/23: Manual Therapy: In supine: Short neck, 5 diaphragmatic breaths, R axillary nodes and establishment of interaxillary pathway, L inguinal nodes and establishment of  axilloinguinal pathway, then L breast moving fluid towards pathways spending extra time in any areas of fibrosis then retracing all steps then L UE working proximal to distal with increased time spent at axilla and upper arm then retracing all steps.  STM using cocoa butter In R sidelying to L serratus and lateral breast in area of tightness/fibrosis with good improvement noted by end of session    The patient was assessed using the L-Dex machine today to produce a lymphedema index baseline score. The patient will be reassessed on a regular basis (typically every 3 months) to obtain new L-Dex scores. If the score is > 6.5 points away from his/her baseline score indicating onset of subclinical lymphedema, it will be recommended to wear a compression garment for 4 weeks, 12 hours per day and then be reassessed. If the score continues  to be > 6.5 points from baseline at reassessment, we will initiate lymphedema treatment. Assessing in this manner has a 95% rate of preventing clinically significant lymphedema.   11/12/23: Manual Therapy: In supine: Short neck, 5 diaphragmatic breaths, R axillary nodes and establishment of interaxillary pathway, L inguinal nodes and establishment of axilloinguinal pathway, then L breast moving fluid towards pathways spending extra time in any areas of fibrosis then retracing all steps then L UE working proximal to distal with increased time spent at axilla and upper arm then retracing all steps.  MFR to area of cording with UE in to abduction with cording more difficult to palpate but definite areas of tightness In R sidelying to L serratus and lateral breast in area of tightness/fibrosis   PATIENT EDUCATION:  Wear compression bra 12 hrs/day for 4 weeks during day time hours Access Code: [email or web address] URL: [email or web address] Date: 08/06/2023 Prepared by: Grayce Sheldon  Exercises - Single Arm Doorway Pec Stretch at 90 Degrees Abduction  - 2 x daily - 7 x weekly - 1 sets - 3 reps - 20-30 hold - Supine Lower Trunk Rotation  - 1 x daily - 7 x weekly - 1 sets - 3 reps - 20-30 hold Education details: nerve desensitization, supine dowel exercises, lymphedema risk reduction and importance of skin care Person educated: Patient Education method: Explanation, Demonstration, Tactile cues, and Handouts Education comprehension: verbalized understanding and returned demonstration  HOME EXERCISE PROGRAM: Reviewed previously given post op HEP. Supine dowel exercises in to abduction and flexion  ASSESSMENT:  CLINICAL IMPRESSION: Pt has been using her FlexiTouch but does not feel much compression at the breast. Educated pt that she can adjust the straps to increase compression but if this still does not help then to reach out to [employer] to have someone look at it and adjust it. Continued with  MLD to L breast. Lateral breast and along serratus is still fibrotic so focused manual therapy to this area. Her seroma did feel softer today.   Pt will benefit from skilled therapeutic intervention to improve on the following deficits: Decreased knowledge of precautions, impaired UE functional use, pain, decreased ROM, postural dysfunction, increased edema.   PT treatment/interventions: ADL/Self care home management, [phone]- PT Re-evaluation, 97110-Therapeutic exercises, 97530- Therapeutic activity, [id number]- Neuromuscular re-education, 97535- Self Care, 02859- Manual therapy, [id number]- Orthotic Initial, and [id number]- Orthotic/Prosthetic subsequent   GOALS: Goals reviewed with patient? Yes  LONG TERM GOALS:  (STG=LTG)  GOALS Name Target Date  Goal status  1 Pt will demonstrate she has regained full shoulder ROM and function post operatively compared to baselines.  Baseline: 08/22/23 MET  2 Pt will report no  pain at end range of L shoulder abduction to allow improved function. 08/22/23 MET  3 Pt will be independent in a home exercise program for continued stretching and strengthening.  08/22/23 MET  4 Pt will be independent in self MLD for long term management of L breast lymphedema and swelling in L axilla to decrease risk of infection. 10/29/23 MET 10/29/23  5 Pt will report a 50% improvement in pain in L breast to allow improved comfort.  11/26/23 11/26/23- ONGOING Varies - some days the pain is much better but then worsens   6 Pt will return to the green on the SOZO demonstrating reversal of subclinical lymphedema.  11/26/23 MET 11/14/23 returned to green  7 Pt will demonstrate a 75% improvement in pain and discomfort of L axilla from cording to allow improved comfort and function 11/26/23 MET 11/26/23  8 Pt will obtain a swell spot for additional compression in her compression bra. 01/08/24 NEW     PLAN:  PT FREQUENCY/DURATION: 2x/wk for 4 wks  PLAN FOR NEXT SESSION: Cont MLD to L breast and STM to  seroma  [facility]  [address], [address]  [place] [address]  [phone]  Self manual lymph drainage: Do circles behind the collar bones x 10  Perform this sequence once a day.  Only give enough pressure no your skin to make the skin move.  Diaphragmatic - Supine   Inhale through nose making navel move out toward hands. Exhale through puckered lips, hands follow navel in. Repeat _5__ times. Rest _10__ seconds between repeats.   Copyright  VHI. All rights reserved.  Hug yourself.  Do circles at your neck just above your collarbones.  Repeat this 10 times.  Axilla - One at a Time   Using full weight of flat hand and fingers at center of uninvolved (R) armpit, make _10__ in-place circles.   Copyright  VHI. All rights reserved.  LEG: Inguinal Nodes Stimulation   With small finger side of hand against hip crease on involved (L) side, gently perform circles at the crease. Repeat __10_ times.   Copyright  VHI. All rights reserved.  Axilla to Inguinal Nodes - Sweep   On involved side, stretch skin from armpit along side of trunk to hip crease.  Now gently stretch skin from the involved side to the uninvolved side across the chest at the shoulder line.    Move fluid from L armpit in area that feels swollen towards these 2 pathways by gently stretching skin.   Finish by doing circles in R armpit and L groin.   Finish by doing the pathways as described above going from your involved armpit to the same side groin and going across your upper chest from the involved shoulder to the uninvolved shoulder.  Repeat the steps above where you do circles in your left groin and right armpit. Copyright  VHI. All rights reserved.    [facility] [place], PT 12/30/2023, 5:00 PM

## 2024-01-01 ENCOUNTER — Encounter: Payer: Self-pay | Admitting: Physical Therapy

## 2024-01-01 ENCOUNTER — Ambulatory Visit: Payer: Self-pay | Admitting: Physical Therapy

## 2024-01-01 DIAGNOSIS — Z170 Estrogen receptor positive status [ER+]: Secondary | ICD-10-CM

## 2024-01-01 DIAGNOSIS — I890 Lymphedema, not elsewhere classified: Secondary | ICD-10-CM

## 2024-01-01 DIAGNOSIS — Z483 Aftercare following surgery for neoplasm: Secondary | ICD-10-CM

## 2024-01-01 DIAGNOSIS — M25612 Stiffness of left shoulder, not elsewhere classified: Secondary | ICD-10-CM

## 2024-01-01 DIAGNOSIS — R293 Abnormal posture: Secondary | ICD-10-CM

## 2024-01-01 NOTE — Therapy (Signed)
 OUTPATIENT PHYSICAL THERAPY BREAST CANCER TREATMENT   Patient Name: Norma [name] MRN: [id number] DOB:[birth year], 53 y.o., female Today's Date: 01/01/2024  END OF SESSION:  PT End of Session - 01/01/24 0804     Visit Number 30    Number of Visits 32    Date for Recertification  01/08/24    PT Start Time 0802    PT Stop Time 0900    PT Time Calculation (min) 58 min    Activity Tolerance Patient tolerated treatment well    Behavior During Therapy [employer] for tasks assessed/performed             Past Medical History:  Diagnosis Date   Allergy    Anemia    hx of   Anxiety    Arthritis    KNEES   Asthma    treated for during COVID tx   Cancer (HCC) 06/2023   left breast IDC   Chronic headache    Colon polyps    TUBULAR ADENOMAS AND HYPERPLASTIC    Complication of anesthesia    DDD (degenerative disc disease), thoracic    Depression    Diabetes mellitus, type 2 (HCC)    Difficult airway for intubation    Fibromyalgia    GERD (gastroesophageal reflux disease)    Goiter    Hyperlipidemia    Hypertension    Low back pain    Obesity    PONV (postoperative nausea and vomiting)    RA (rheumatoid arthritis) (HCC)    Seasonal allergies    Vitamin D  deficiency    Past Surgical History:  Procedure Laterality Date   ABDOMINAL HYSTERECTOMY  2008   BACK SURGERY  2005   lumbar laminectomy   BALLOON DILATION N/A 06/01/2021   Procedure: BALLOON DILATION;  Surgeon: Albertus Gordy HERO, MD;  Location: [facility];  Service: Gastroenterology;  Laterality: N/A;   BARTHOLIN GLAND CYST EXCISION     BIOPSY  06/01/2021   Procedure: BIOPSY;  Surgeon: Albertus Gordy HERO, MD;  Location: [facility];  Service: Gastroenterology;;   BREAST LUMPECTOMY WITH RADIOACTIVE SEED AND SENTINEL LYMPH NODE BIOPSY Left 07/02/2023   Procedure: BREAST LUMPECTOMY WITH RADIOACTIVE SEED AND SENTINEL LYMPH NODE BIOPSY;  Surgeon: Aron Shoulders, MD;  Location: [facility];  Service: General;   Laterality: Left;  90 MIN 78444- EXCISION LEFT CHEST WALL MASS GEN COMBINED WITH REGIONAL   COLONOSCOPY  2017   JMP-MAC-prep good-TA -recall 5 yr   COLONOSCOPY WITH PROPOFOL  N/A 06/01/2021   Procedure: COLONOSCOPY WITH PROPOFOL ;  Surgeon: Albertus Gordy HERO, MD;  Location: [facility];  Service: Gastroenterology;  Laterality: N/A;   ESOPHAGOGASTRODUODENOSCOPY (EGD) WITH PROPOFOL  N/A 06/01/2021   Procedure: ESOPHAGOGASTRODUODENOSCOPY (EGD) WITH PROPOFOL ;  Surgeon: Albertus Gordy HERO, MD;  Location: [facility];  Service: Gastroenterology;  Laterality: N/A;   LUMBAR LAMINECTOMY/DECOMPRESSION MICRODISCECTOMY Right 07/17/2013   Procedure: LUMBAR LAMINECTOMY/DECOMPRESSION MICRODISCECTOMY 1 LEVEL,RIGHT LUMBAR FOUR-FIVE;  Surgeon: Darina MALVA Boehringer, MD;  Location: [facility];  Service: Neurosurgery;  Laterality: Right;  Right   MASS EXCISION Left 07/02/2023   Procedure: EXCISION, MASS, CHEST WALL;  Surgeon: Aron Shoulders, MD;  Location: [facility];  Service: General;  Laterality: Left;   PARTIAL HYSTERECTOMY     POLYPECTOMY  2017   TA and benign polypoid   POLYPECTOMY  06/01/2021   Procedure: POLYPECTOMY;  Surgeon: Albertus Gordy HERO, MD;  Location: [facility];  Service: Gastroenterology;;   WISDOM TOOTH EXTRACTION     Patient Active Problem List  Diagnosis Date Noted   Genetic testing 06/23/2023   Malignant neoplasm of upper-outer quadrant of left breast in female, estrogen receptor positive (HCC) 06/11/2023   Gastritis without bleeding    Esophageal dysphagia    Schatzki's ring    Benign neoplasm of ascending colon    Class 3 severe obesity with serious comorbidity and body mass index (BMI) of 40.0 to 44.9 in adult (HCC) 08/25/2019   Type 2 diabetes mellitus without complication, without long-term current use of insulin (HCC) 08/25/2019   Chronic cough 06/27/2017   LUQ abdominal pain 12/07/2014   RUQ abdominal pain 11/10/2013   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   Gastroesophageal  reflux disease 12/18/2012   Hx of adenomatous colonic polyps 12/18/2012   Chronic RLQ pain 12/18/2012   IBS (irritable bowel syndrome) 01/22/2011   HTN (hypertension) 01/22/2011   Hyperlipidemia 01/22/2011   Anxiety and depression 01/22/2011    PCP: Haze Servant, NP  REFERRING PROVIDER: Dr. Jina Nephew   REFERRING DIAG: Left breast cancer  THERAPY DIAG:  Lymphedema, not elsewhere classified  Stiffness of left shoulder, not elsewhere classified  Aftercare following surgery for neoplasm  Abnormal posture  Malignant neoplasm of upper-outer quadrant of left breast in female, estrogen receptor positive (HCC)  Rationale for Evaluation and Treatment: Rehabilitation  ONSET DATE: 04/26/23  SUBJECTIVE:                                                                                                                                                                                           SUBJECTIVE STATEMENT: I was able to adjust the compression pump and that helped. This morning when I was bathing I felt like it felt softer.   PERTINENT HISTORY:  Patient was diagnosed on 04/26/2023 with left grade 3 invasive ductal carcinoma breast cancer. It measures 1 cm and is located in the upper outer quadrant. It is ER/PR positive and HER2 negative with a Ki67 of 30%. Pt underwent a L lumpectomy and SLNB 0/2 on 07/02/23  PATIENT GOALS:  Reassess how my recovery is going related to arm function, pain, and swelling.  PAIN:  Are you having pain?  Pain in L breast, 1/10, dull pain, not touching it makes it better  PRECAUTIONS: Recent Surgery, left UE Lymphedema risk,   RED FLAGS: None   ACTIVITY LEVEL / LEISURE: doing post op exercises   OBJECTIVE:   PATIENT SURVEYS:  QUICK DASH:     OBSERVATIONS: Healing scars with some increased scar tissue at L axilla  POSTURE:  Forward head and rounded shoulders posture   UPPER EXTREMITY AROM/PROM:   A/PROM RIGHT   eval  Shoulder  extension 48  Shoulder flexion 153  Shoulder abduction 165  Shoulder internal rotation 63  Shoulder external rotation 84                          (Blank rows = not tested)   A/PROM LEFT   eval LEFT  07/25/23 LEFT 08/08/23 LEFT 10/01/23  Shoulder extension 35 70    Shoulder flexion 150 162 171 176  Shoulder abduction 167 156 151 163  Shoulder internal rotation 63 69    Shoulder external rotation 75 80                            (Blank rows = not tested)   CERVICAL AROM: All within normal limits   UPPER EXTREMITY STRENGTH: WFL   LYMPHEDEMA ASSESSMENTS (in cm):    LANDMARK RIGHT   eval  10 cm proximal to olecranon process 38.8  Olecranon process 28.7  10 cm proximal to ulnar styloid process 25.8  Just proximal to ulnar styloid process 17  Across hand at thumb web space 20.5  At base of 2nd digit 5.9  (Blank rows = not tested)   LANDMARK LEFT   eval LEFT 07/25/23  10 cm proximal to olecranon process 40 38.4  Olecranon process 28.8 29.5  10 cm proximal to ulnar styloid process 24.3 23  Just proximal to ulnar styloid process 16.3 16.5  Across hand at thumb web space 19.5 20  At base of 2nd digit 5.9 5.8  (Blank rows = not tested)  Surgery type/Date: 07/02/23 L breast lumpectomy and SLNB Number of lymph nodes removed: 0/2 Current/past treatment (chemo, radiation, hormone therapy): does not need chemo, will require radiation, will need hormone therapy Other symptoms:  Heaviness/tightness Yes Pain Yes Pitting edema No Infections No Decreased scar mobility Yes Stemmer sign No     TREATMENT PERFORMED: 01/01/24 Manual Therapy: MLD: In supine: Short neck, 5 diaphragmatic breaths, R axillary nodes and establishment of interaxillary pathway, L inguinal nodes and establishment of axilloinguinal pathway, then L breast moving fluid towards pathways then to R sidelying working on L lateral trunk moving fluid towards pathway at side and to posterior inter axillary pathway  spending extra time in any areas of fibrosis at lateral breast then retracing all steps in supine. Area of fibrosis at lateral breast/trunk is softer and more pliable today. STM to area of seroma at lateral breast and area of fibrosis at lateral inferior breast which is also softer today  12/30/23 Manual Therapy: MLD: In supine: Short neck, 5 diaphragmatic breaths, R axillary nodes and establishment of interaxillary pathway, L inguinal nodes and establishment of axilloinguinal pathway, then L breast moving fluid towards pathways then to R sidelying working on L lateral trunk moving fluid towards pathway at side and to posterior inter axillary pathway spending extra time in any areas of fibrosis at lateral breast then retracing all steps in supine.  STM to area of seroma at lateral breast and area of fibrosis at lateral inferior breast  12/26/23 Manual Therapy: MLD: In supine: Short neck, 5 diaphragmatic breaths, R axillary nodes and establishment of interaxillary pathway, L inguinal nodes and establishment of axilloinguinal pathway, then L breast moving fluid towards pathways then to R sidelying working on L lateral trunk moving fluid towards pathway at side and to posterior inter axillary pathway spending extra time in any areas of fibrosis at lateral breast then retracing all steps  in supine.  STM to area of seroma at lateral breast and area of fibrosis at lateral inferior breast   12/24/23 Manual Therapy: MLD: In supine: Short neck, 5 diaphragmatic breaths, R axillary nodes and establishment of interaxillary pathway, L inguinal nodes and establishment of axilloinguinal pathway, then L breast moving fluid towards pathways then to R sidelying working on L lateral trunk moving fluid towards pathway at side and to posterior inter axillary pathway spending extra time in any areas of fibrosis at lateral breast then retracing all steps in supine.  STM to area of seroma at lateral breast - area felt  softer today  12/18/23: Manual Therapy: Discussed taking the swell spot out of the bra if she has any discomfort or to readjust throughout the day MLD: In supine: Short neck, 5 diaphragmatic breaths, R axillary nodes and establishment of interaxillary pathway, L inguinal nodes and establishment of axilloinguinal pathway, then L breast moving fluid towards pathways spending extra time in any areas of fibrosis at lateral breast then retracing all steps. Spent increased time at medial and lateral breast where she had increased discomfort.   12/16/23: Manual Therapy: Discussed how to wear the swell spot in the bra and how it is ok to sleep in it STM to area of seroma and fibrosis just lateral to areola with pitting edema noted in this area MLD: In supine: Short neck, 5 diaphragmatic breaths, R axillary nodes and establishment of interaxillary pathway, L inguinal nodes and establishment of axilloinguinal pathway, then L breast moving fluid towards pathways spending extra time in any areas of fibrosis at lateral breast then retracing all steps   12/11/23: Manual Therapy: Discussed how a seroma is different than lymphedema and the importnace of increasing compression to help the seroma heal Created foam chip pack and placed in thick stockinette for pt to wear in her bra for additional compression STM to area of seroma and fibrosis just lateral to areola with pitting edema noted in this area MLD: In supine: Short neck, 5 diaphragmatic breaths, R axillary nodes and establishment of interaxillary pathway, L inguinal nodes and establishment of axilloinguinal pathway, then L breast moving fluid towards pathways spending extra time in any areas of fibrosis at lateral breast then retracing all steps  Issued info for pt to obtain a swell spot for her breast to help decrease lymphedema and heal seroma  11/26/23: Manual Therapy: In supine: Short neck, 5 diaphragmatic breaths, R axillary nodes and establishment of  interaxillary pathway, L inguinal nodes and establishment of axilloinguinal pathway, then L breast moving fluid towards pathways spending extra time in any areas of fibrosis at lateral breast then retracing all steps  STM In R sidelying to L serratus and lateral breast in area of tightness/fibrosis with pt reporting increased tenderness today then back to supine to complete MLD  11/21/23: Manual Therapy: In supine: Short neck, 5 diaphragmatic breaths, R axillary nodes and establishment of interaxillary pathway, L inguinal nodes and establishment of axilloinguinal pathway, then L breast moving fluid towards pathways spending extra time in any areas of fibrosis then retracing all steps then L UE working proximal to distal with increased time spent at axilla and upper arm then retracing all steps.  MFR to L axilla STM using cocoa butter and WAVE tool In R sidelying to L serratus and lateral breast in area of tightness/fibrosis with good improvement noted by end of session  11/19/23: Manual Therapy: In supine: Short neck, 5 diaphragmatic breaths, R axillary nodes and establishment of  interaxillary pathway, L inguinal nodes and establishment of axilloinguinal pathway, then L breast moving fluid towards pathways spending extra time in any areas of fibrosis then retracing all steps then L UE working proximal to distal with increased time spent at axilla and upper arm then retracing all steps.  STM using cocoa butter In R sidelying to L serratus and lateral breast in area of tightness/fibrosis with good improvement noted by end of session  11/14/23: Manual Therapy: In supine: Short neck, 5 diaphragmatic breaths, R axillary nodes and establishment of interaxillary pathway, L inguinal nodes and establishment of axilloinguinal pathway, then L breast moving fluid towards pathways spending extra time in any areas of fibrosis then retracing all steps then L UE working proximal to distal with increased time spent at  axilla and upper arm then retracing all steps.  STM using cocoa butter In R sidelying to L serratus and lateral breast in area of tightness/fibrosis with good improvement noted by end of session    The patient was assessed using the L-Dex machine today to produce a lymphedema index baseline score. The patient will be reassessed on a regular basis (typically every 3 months) to obtain new L-Dex scores. If the score is > 6.5 points away from his/her baseline score indicating onset of subclinical lymphedema, it will be recommended to wear a compression garment for 4 weeks, 12 hours per day and then be reassessed. If the score continues to be > 6.5 points from baseline at reassessment, we will initiate lymphedema treatment. Assessing in this manner has a 95% rate of preventing clinically significant lymphedema.   11/12/23: Manual Therapy: In supine: Short neck, 5 diaphragmatic breaths, R axillary nodes and establishment of interaxillary pathway, L inguinal nodes and establishment of axilloinguinal pathway, then L breast moving fluid towards pathways spending extra time in any areas of fibrosis then retracing all steps then L UE working proximal to distal with increased time spent at axilla and upper arm then retracing all steps.  MFR to area of cording with UE in to abduction with cording more difficult to palpate but definite areas of tightness In R sidelying to L serratus and lateral breast in area of tightness/fibrosis   PATIENT EDUCATION:  Wear compression bra 12 hrs/day for 4 weeks during day time hours Access Code: [email or web address] URL: [email or web address] Date: 08/06/2023 Prepared by: Grayce Sheldon  Exercises - Single Arm Doorway Pec Stretch at 90 Degrees Abduction  - 2 x daily - 7 x weekly - 1 sets - 3 reps - 20-30 hold - Supine Lower Trunk Rotation  - 1 x daily - 7 x weekly - 1 sets - 3 reps - 20-30 hold Education details: nerve desensitization, supine dowel exercises, lymphedema  risk reduction and importance of skin care Person educated: Patient Education method: Explanation, Demonstration, Tactile cues, and Handouts Education comprehension: verbalized understanding and returned demonstration  HOME EXERCISE PROGRAM: Reviewed previously given post op HEP. Supine dowel exercises in to abduction and flexion  ASSESSMENT:  CLINICAL IMPRESSION: Pt has started using her compression pump. She felt her seroma was softer today and therapist also felt improvement. Continued with MLD today and spent extra time in areas of fibrosis. Overall her pain is improving though she still has shooting pains occasionally. The adjustments she made to the vest on the compression pump have improved the compression at the breast and now she is able to feel it.   Pt will benefit from skilled therapeutic intervention to improve on the following deficits: Decreased knowledge of  precautions, impaired UE functional use, pain, decreased ROM, postural dysfunction, increased edema.   PT treatment/interventions: ADL/Self care home management, [phone]- PT Re-evaluation, 97110-Therapeutic exercises, 97530- Therapeutic activity, [id number]- Neuromuscular re-education, 97535- Self Care, 02859- Manual therapy, [id number]- Orthotic Initial, and [id number]- Orthotic/Prosthetic subsequent   GOALS: Goals reviewed with patient? Yes  LONG TERM GOALS:  (STG=LTG)  GOALS Name Target Date  Goal status  1 Pt will demonstrate she has regained full shoulder ROM and function post operatively compared to baselines.  Baseline: 08/22/23 MET  2 Pt will report no pain at end range of L shoulder abduction to allow improved function. 08/22/23 MET  3 Pt will be independent in a home exercise program for continued stretching and strengthening.  08/22/23 MET  4 Pt will be independent in self MLD for long term management of L breast lymphedema and swelling in L axilla to decrease risk of infection. 10/29/23 MET 10/29/23  5 Pt will report a 50%  improvement in pain in L breast to allow improved comfort.  11/26/23 11/26/23- ONGOING Varies - some days the pain is much better but then worsens   6 Pt will return to the green on the SOZO demonstrating reversal of subclinical lymphedema.  11/26/23 MET 11/14/23 returned to green  7 Pt will demonstrate a 75% improvement in pain and discomfort of L axilla from cording to allow improved comfort and function 11/26/23 MET 11/26/23  8 Pt will obtain a swell spot for additional compression in her compression bra. 01/08/24 NEW     PLAN:  PT FREQUENCY/DURATION: 2x/wk for 4 wks  PLAN FOR NEXT SESSION: Cont MLD to L breast and STM to seroma  [facility]  [address], [address]  [place] [address]  [phone]  Self manual lymph drainage: Do circles behind the collar bones x 10  Perform this sequence once a day.  Only give enough pressure no your skin to make the skin move.  Diaphragmatic - Supine   Inhale through nose making navel move out toward hands. Exhale through puckered lips, hands follow navel in. Repeat _5__ times. Rest _10__ seconds between repeats.   Copyright  VHI. All rights reserved.  Hug yourself.  Do circles at your neck just above your collarbones.  Repeat this 10 times.  Axilla - One at a Time   Using full weight of flat hand and fingers at center of uninvolved (R) armpit, make _10__ in-place circles.   Copyright  VHI. All rights reserved.  LEG: Inguinal Nodes Stimulation   With small finger side of hand against hip crease on involved (L) side, gently perform circles at the crease. Repeat __10_ times.   Copyright  VHI. All rights reserved.  Axilla to Inguinal Nodes - Sweep   On involved side, stretch skin from armpit along side of trunk to hip crease.  Now gently stretch skin from the involved side to the uninvolved side across the chest at the shoulder line.    Move fluid from L armpit in area that feels swollen towards these 2  pathways by gently stretching skin.   Finish by doing circles in R armpit and L groin.   Finish by doing the pathways as described above going from your involved armpit to the same side groin and going across your upper chest from the involved shoulder to the uninvolved shoulder.  Repeat the steps above where you do circles in your left groin and right armpit. Copyright  VHI. All rights reserved.    Florina  [place], PT 01/01/2024, 9:09 AM

## 2024-01-07 ENCOUNTER — Encounter: Payer: Self-pay | Admitting: Physical Therapy

## 2024-01-07 ENCOUNTER — Ambulatory Visit: Payer: Self-pay | Admitting: Physical Therapy

## 2024-01-07 DIAGNOSIS — I890 Lymphedema, not elsewhere classified: Secondary | ICD-10-CM

## 2024-01-07 DIAGNOSIS — Z483 Aftercare following surgery for neoplasm: Secondary | ICD-10-CM

## 2024-01-07 DIAGNOSIS — Z170 Estrogen receptor positive status [ER+]: Secondary | ICD-10-CM

## 2024-01-07 DIAGNOSIS — M25612 Stiffness of left shoulder, not elsewhere classified: Secondary | ICD-10-CM

## 2024-01-07 DIAGNOSIS — R293 Abnormal posture: Secondary | ICD-10-CM

## 2024-01-07 NOTE — Therapy (Signed)
 OUTPATIENT PHYSICAL THERAPY BREAST CANCER TREATMENT   Patient Name: Norma [name] MRN: [id number] DOB:03/14/1970, 53 y.o., female Today's Date: 01/07/2024  END OF SESSION:  PT End of Session - 01/07/24 1612     Visit Number 31    Number of Visits 32    Date for Recertification  01/08/24    PT Start Time 1606    PT Stop Time 1655    PT Time Calculation (min) 49 min    Activity Tolerance Patient tolerated treatment well    Behavior During Therapy WFL for tasks assessed/performed             Past Medical History:  Diagnosis Date   Allergy    Anemia    hx of   Anxiety    Arthritis    KNEES   Asthma    treated for during COVID tx   Cancer (HCC) 06/2023   left breast IDC   Chronic headache    Colon polyps    TUBULAR ADENOMAS AND HYPERPLASTIC    Complication of anesthesia    DDD (degenerative disc disease), thoracic    Depression    Diabetes mellitus, type 2 (HCC)    Difficult airway for intubation    Fibromyalgia    GERD (gastroesophageal reflux disease)    Goiter    Hyperlipidemia    Hypertension    Low back pain    Obesity    PONV (postoperative nausea and vomiting)    RA (rheumatoid arthritis) (HCC)    Seasonal allergies    Vitamin D  deficiency    Past Surgical History:  Procedure Laterality Date   ABDOMINAL HYSTERECTOMY  2008   BACK SURGERY  2005   lumbar laminectomy   BALLOON DILATION N/A 06/01/2021   Procedure: BALLOON DILATION;  Surgeon: Albertus Gordy HERO, MD;  Location: [facility];  Service: Gastroenterology;  Laterality: N/A;   BARTHOLIN GLAND CYST EXCISION     BIOPSY  06/01/2021   Procedure: BIOPSY;  Surgeon: Albertus Gordy HERO, MD;  Location: [facility];  Service: Gastroenterology;;   BREAST LUMPECTOMY WITH RADIOACTIVE SEED AND SENTINEL LYMPH NODE BIOPSY Left 07/02/2023   Procedure: BREAST LUMPECTOMY WITH RADIOACTIVE SEED AND SENTINEL LYMPH NODE BIOPSY;  Surgeon: Aron Shoulders, MD;  Location: [facility];  Service: General;   Laterality: Left;  90 MIN 78444- EXCISION LEFT CHEST WALL MASS GEN COMBINED WITH REGIONAL   COLONOSCOPY  2017   JMP-MAC-prep good-TA -recall 5 yr   COLONOSCOPY WITH PROPOFOL  N/A 06/01/2021   Procedure: COLONOSCOPY WITH PROPOFOL ;  Surgeon: Albertus Gordy HERO, MD;  Location: [facility];  Service: Gastroenterology;  Laterality: N/A;   ESOPHAGOGASTRODUODENOSCOPY (EGD) WITH PROPOFOL  N/A 06/01/2021   Procedure: ESOPHAGOGASTRODUODENOSCOPY (EGD) WITH PROPOFOL ;  Surgeon: Albertus Gordy HERO, MD;  Location: [facility];  Service: Gastroenterology;  Laterality: N/A;   LUMBAR LAMINECTOMY/DECOMPRESSION MICRODISCECTOMY Right 07/17/2013   Procedure: LUMBAR LAMINECTOMY/DECOMPRESSION MICRODISCECTOMY 1 LEVEL,RIGHT LUMBAR FOUR-FIVE;  Surgeon: Darina MALVA Boehringer, MD;  Location: [facility];  Service: Neurosurgery;  Laterality: Right;  Right   MASS EXCISION Left 07/02/2023   Procedure: EXCISION, MASS, CHEST WALL;  Surgeon: Aron Shoulders, MD;  Location: [facility];  Service: General;  Laterality: Left;   PARTIAL HYSTERECTOMY     POLYPECTOMY  2017   TA and benign polypoid   POLYPECTOMY  06/01/2021   Procedure: POLYPECTOMY;  Surgeon: Albertus Gordy HERO, MD;  Location: [facility];  Service: Gastroenterology;;   WISDOM TOOTH EXTRACTION     Patient Active Problem List  Diagnosis Date Noted   Genetic testing 06/23/2023   Malignant neoplasm of upper-outer quadrant of left breast in female, estrogen receptor positive (HCC) 06/11/2023   Gastritis without bleeding    Esophageal dysphagia    Schatzki's ring    Benign neoplasm of ascending colon    Class 3 severe obesity with serious comorbidity and body mass index (BMI) of 40.0 to 44.9 in adult (HCC) 08/25/2019   Type 2 diabetes mellitus without complication, without long-term current use of insulin (HCC) 08/25/2019   Chronic cough 06/27/2017   LUQ abdominal pain 12/07/2014   RUQ abdominal pain 11/10/2013   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   Gastroesophageal  reflux disease 12/18/2012   Hx of adenomatous colonic polyps 12/18/2012   Chronic RLQ pain 12/18/2012   IBS (irritable bowel syndrome) 01/22/2011   HTN (hypertension) 01/22/2011   Hyperlipidemia 01/22/2011   Anxiety and depression 01/22/2011    PCP: Haze Servant, NP  REFERRING PROVIDER: Dr. Jina Nephew   REFERRING DIAG: Left breast cancer  THERAPY DIAG:  Lymphedema, not elsewhere classified  Stiffness of left shoulder, not elsewhere classified  Aftercare following surgery for neoplasm  Abnormal posture  Malignant neoplasm of upper-outer quadrant of left breast in female, estrogen receptor positive (HCC)  Rationale for Evaluation and Treatment: Rehabilitation  ONSET DATE: 04/26/23  SUBJECTIVE:                                                                                                                                                                                           SUBJECTIVE STATEMENT: I have been having a lot of pain and I have been very frustrated. I have been wearing my swell spot.   PERTINENT HISTORY:  Patient was diagnosed on 04/26/2023 with left grade 3 invasive ductal carcinoma breast cancer. It measures 1 cm and is located in the upper outer quadrant. It is ER/PR positive and HER2 negative with a Ki67 of 30%. Pt underwent a L lumpectomy and SLNB 0/2 on 07/02/23  PATIENT GOALS:  Reassess how my recovery is going related to arm function, pain, and swelling.  PAIN:  Are you having pain?  Pain in L breast, 2/10, dull pain, not touching it makes it better  PRECAUTIONS: Recent Surgery, left UE Lymphedema risk,   RED FLAGS: None   ACTIVITY LEVEL / LEISURE: doing post op exercises   OBJECTIVE:   PATIENT SURVEYS:  QUICK DASH:     OBSERVATIONS: Healing scars with some increased scar tissue at L axilla  POSTURE:  Forward head and rounded shoulders posture   UPPER EXTREMITY AROM/PROM:   A/PROM RIGHT   eval    Shoulder extension  48  Shoulder  flexion 153  Shoulder abduction 165  Shoulder internal rotation 63  Shoulder external rotation 84                          (Blank rows = not tested)   A/PROM LEFT   eval LEFT  07/25/23 LEFT 08/08/23 LEFT 10/01/23  Shoulder extension 35 70    Shoulder flexion 150 162 171 176  Shoulder abduction 167 156 151 163  Shoulder internal rotation 63 69    Shoulder external rotation 75 80                            (Blank rows = not tested)   CERVICAL AROM: All within normal limits   UPPER EXTREMITY STRENGTH: WFL   LYMPHEDEMA ASSESSMENTS (in cm):    LANDMARK RIGHT   eval  10 cm proximal to olecranon process 38.8  Olecranon process 28.7  10 cm proximal to ulnar styloid process 25.8  Just proximal to ulnar styloid process 17  Across hand at thumb web space 20.5  At base of 2nd digit 5.9  (Blank rows = not tested)   LANDMARK LEFT   eval LEFT 07/25/23  10 cm proximal to olecranon process 40 38.4  Olecranon process 28.8 29.5  10 cm proximal to ulnar styloid process 24.3 23  Just proximal to ulnar styloid process 16.3 16.5  Across hand at thumb web space 19.5 20  At base of 2nd digit 5.9 5.8  (Blank rows = not tested)  Surgery type/Date: 07/02/23 L breast lumpectomy and SLNB Number of lymph nodes removed: 0/2 Current/past treatment (chemo, radiation, hormone therapy): does not need chemo, will require radiation, will need hormone therapy Other symptoms:  Heaviness/tightness Yes Pain Yes Pitting edema No Infections No Decreased scar mobility Yes Stemmer sign No     TREATMENT PERFORMED: 01/07/24 Manual Therapy: MLD: In supine: Short neck, 5 diaphragmatic breaths, R axillary nodes and establishment of interaxillary pathway, L inguinal nodes and establishment of axilloinguinal pathway, then L breast moving fluid towards pathways then to R sidelying working on L lateral trunk moving fluid towards pathway at side and to posterior inter axillary pathway spending extra time in any  areas of fibrosis at lateral breast then retracing all steps in supine. Palpated seroma today and it felt more firm and larger. Palpated in supine and sitting.  STM to area of seroma at lateral breast and to lateral trunk with decreased pain noted afterwards  01/01/24 Manual Therapy: MLD: In supine: Short neck, 5 diaphragmatic breaths, R axillary nodes and establishment of interaxillary pathway, L inguinal nodes and establishment of axilloinguinal pathway, then L breast moving fluid towards pathways then to R sidelying working on L lateral trunk moving fluid towards pathway at side and to posterior inter axillary pathway spending extra time in any areas of fibrosis at lateral breast then retracing all steps in supine. Area of fibrosis at lateral breast/trunk is softer and more pliable today. STM to area of seroma at lateral breast and area of fibrosis at lateral inferior breast which is also softer today  12/30/23 Manual Therapy: MLD: In supine: Short neck, 5 diaphragmatic breaths, R axillary nodes and establishment of interaxillary pathway, L inguinal nodes and establishment of axilloinguinal pathway, then L breast moving fluid towards pathways then to R sidelying working on L lateral trunk moving fluid towards pathway at side and to posterior inter axillary pathway spending  extra time in any areas of fibrosis at lateral breast then retracing all steps in supine.  STM to area of seroma at lateral breast and area of fibrosis at lateral inferior breast  12/26/23 Manual Therapy: MLD: In supine: Short neck, 5 diaphragmatic breaths, R axillary nodes and establishment of interaxillary pathway, L inguinal nodes and establishment of axilloinguinal pathway, then L breast moving fluid towards pathways then to R sidelying working on L lateral trunk moving fluid towards pathway at side and to posterior inter axillary pathway spending extra time in any areas of fibrosis at lateral breast then retracing all steps  in supine.  STM to area of seroma at lateral breast and area of fibrosis at lateral inferior breast   12/24/23 Manual Therapy: MLD: In supine: Short neck, 5 diaphragmatic breaths, R axillary nodes and establishment of interaxillary pathway, L inguinal nodes and establishment of axilloinguinal pathway, then L breast moving fluid towards pathways then to R sidelying working on L lateral trunk moving fluid towards pathway at side and to posterior inter axillary pathway spending extra time in any areas of fibrosis at lateral breast then retracing all steps in supine.  STM to area of seroma at lateral breast - area felt softer today  12/18/23: Manual Therapy: Discussed taking the swell spot out of the bra if she has any discomfort or to readjust throughout the day MLD: In supine: Short neck, 5 diaphragmatic breaths, R axillary nodes and establishment of interaxillary pathway, L inguinal nodes and establishment of axilloinguinal pathway, then L breast moving fluid towards pathways spending extra time in any areas of fibrosis at lateral breast then retracing all steps. Spent increased time at medial and lateral breast where she had increased discomfort.   12/16/23: Manual Therapy: Discussed how to wear the swell spot in the bra and how it is ok to sleep in it STM to area of seroma and fibrosis just lateral to areola with pitting edema noted in this area MLD: In supine: Short neck, 5 diaphragmatic breaths, R axillary nodes and establishment of interaxillary pathway, L inguinal nodes and establishment of axilloinguinal pathway, then L breast moving fluid towards pathways spending extra time in any areas of fibrosis at lateral breast then retracing all steps   12/11/23: Manual Therapy: Discussed how a seroma is different than lymphedema and the importnace of increasing compression to help the seroma heal Created foam chip pack and placed in thick stockinette for pt to wear in her bra for additional  compression STM to area of seroma and fibrosis just lateral to areola with pitting edema noted in this area MLD: In supine: Short neck, 5 diaphragmatic breaths, R axillary nodes and establishment of interaxillary pathway, L inguinal nodes and establishment of axilloinguinal pathway, then L breast moving fluid towards pathways spending extra time in any areas of fibrosis at lateral breast then retracing all steps  Issued info for pt to obtain a swell spot for her breast to help decrease lymphedema and heal seroma  11/26/23: Manual Therapy: In supine: Short neck, 5 diaphragmatic breaths, R axillary nodes and establishment of interaxillary pathway, L inguinal nodes and establishment of axilloinguinal pathway, then L breast moving fluid towards pathways spending extra time in any areas of fibrosis at lateral breast then retracing all steps  STM In R sidelying to L serratus and lateral breast in area of tightness/fibrosis with pt reporting increased tenderness today then back to supine to complete MLD  11/21/23: Manual Therapy: In supine: Short neck, 5 diaphragmatic breaths, R  axillary nodes and establishment of interaxillary pathway, L inguinal nodes and establishment of axilloinguinal pathway, then L breast moving fluid towards pathways spending extra time in any areas of fibrosis then retracing all steps then L UE working proximal to distal with increased time spent at axilla and upper arm then retracing all steps.  MFR to L axilla STM using cocoa butter and WAVE tool In R sidelying to L serratus and lateral breast in area of tightness/fibrosis with good improvement noted by end of session  11/19/23: Manual Therapy: In supine: Short neck, 5 diaphragmatic breaths, R axillary nodes and establishment of interaxillary pathway, L inguinal nodes and establishment of axilloinguinal pathway, then L breast moving fluid towards pathways spending extra time in any areas of fibrosis then retracing all steps then L  UE working proximal to distal with increased time spent at axilla and upper arm then retracing all steps.  STM using cocoa butter In R sidelying to L serratus and lateral breast in area of tightness/fibrosis with good improvement noted by end of session  11/14/23: Manual Therapy: In supine: Short neck, 5 diaphragmatic breaths, R axillary nodes and establishment of interaxillary pathway, L inguinal nodes and establishment of axilloinguinal pathway, then L breast moving fluid towards pathways spending extra time in any areas of fibrosis then retracing all steps then L UE working proximal to distal with increased time spent at axilla and upper arm then retracing all steps.  STM using cocoa butter In R sidelying to L serratus and lateral breast in area of tightness/fibrosis with good improvement noted by end of session    The patient was assessed using the L-Dex machine today to produce a lymphedema index baseline score. The patient will be reassessed on a regular basis (typically every 3 months) to obtain new L-Dex scores. If the score is > 6.5 points away from his/her baseline score indicating onset of subclinical lymphedema, it will be recommended to wear a compression garment for 4 weeks, 12 hours per day and then be reassessed. If the score continues to be > 6.5 points from baseline at reassessment, we will initiate lymphedema treatment. Assessing in this manner has a 95% rate of preventing clinically significant lymphedema.   11/12/23: Manual Therapy: In supine: Short neck, 5 diaphragmatic breaths, R axillary nodes and establishment of interaxillary pathway, L inguinal nodes and establishment of axilloinguinal pathway, then L breast moving fluid towards pathways spending extra time in any areas of fibrosis then retracing all steps then L UE working proximal to distal with increased time spent at axilla and upper arm then retracing all steps.  MFR to area of cording with UE in to abduction with cording  more difficult to palpate but definite areas of tightness In R sidelying to L serratus and lateral breast in area of tightness/fibrosis   PATIENT EDUCATION:  Wear compression bra 12 hrs/day for 4 weeks during day time hours Access Code: [email or web address] URL: [email or web address] Date: 08/06/2023 Prepared by: Grayce Sheldon  Exercises - Single Arm Doorway Pec Stretch at 90 Degrees Abduction  - 2 x daily - 7 x weekly - 1 sets - 3 reps - 20-30 hold - Supine Lower Trunk Rotation  - 1 x daily - 7 x weekly - 1 sets - 3 reps - 20-30 hold Education details: nerve desensitization, supine dowel exercises, lymphedema risk reduction and importance of skin care Person educated: Patient Education method: Explanation, Demonstration, Tactile cues, and Handouts Education comprehension: verbalized understanding and returned demonstration  HOME EXERCISE PROGRAM: Reviewed previously  given post op HEP. Supine dowel exercises in to abduction and flexion  ASSESSMENT:  CLINICAL IMPRESSION: Pt reports her compression pump has an error code and the zipper came apart while she was using it. She has had increased pain over the weekend with shooting pain at medial breast. Her seroma felt more firm and larger today. Pt wants to wait and see before scheduling an appointment to see if it needs to be aspirated again. Continued with MLD and STM to lateral trunk with discomfort at lateral trunk improving.   Pt will benefit from skilled therapeutic intervention to improve on the following deficits: Decreased knowledge of precautions, impaired UE functional use, pain, decreased ROM, postural dysfunction, increased edema.   PT treatment/interventions: ADL/Self care home management, [phone]- PT Re-evaluation, 97110-Therapeutic exercises, 97530- Therapeutic activity, [id number]- Neuromuscular re-education, 97535- Self Care, [id number]- Manual therapy, [id number]- Orthotic Initial, and [id number]- Orthotic/Prosthetic  subsequent   GOALS: Goals reviewed with patient? Yes  LONG TERM GOALS:  (STG=LTG)  GOALS Name Target Date  Goal status  1 Pt will demonstrate she has regained full shoulder ROM and function post operatively compared to baselines.  Baseline: 08/22/23 MET  2 Pt will report no pain at end range of L shoulder abduction to allow improved function. 08/22/23 MET  3 Pt will be independent in a home exercise program for continued stretching and strengthening.  08/22/23 MET  4 Pt will be independent in self MLD for long term management of L breast lymphedema and swelling in L axilla to decrease risk of infection. 10/29/23 MET 10/29/23  5 Pt will report a 50% improvement in pain in L breast to allow improved comfort.  11/26/23 11/26/23- ONGOING Varies - some days the pain is much better but then worsens   6 Pt will return to the green on the SOZO demonstrating reversal of subclinical lymphedema.  11/26/23 MET 11/14/23 returned to green  7 Pt will demonstrate a 75% improvement in pain and discomfort of L axilla from cording to allow improved comfort and function 11/26/23 MET 11/26/23  8 Pt will obtain a swell spot for additional compression in her compression bra. 01/08/24 NEW     PLAN:  PT FREQUENCY/DURATION: 2x/wk for 4 wks  PLAN FOR NEXT SESSION: Cont MLD to L breast and STM to seroma  [facility]  [address], [address]  [place] [address]  [phone]  Self manual lymph drainage: Do circles behind the collar bones x 10  Perform this sequence once a day.  Only give enough pressure no your skin to make the skin move.  Diaphragmatic - Supine   Inhale through nose making navel move out toward hands. Exhale through puckered lips, hands follow navel in. Repeat _5__ times. Rest _10__ seconds between repeats.   Copyright  VHI. All rights reserved.  Hug yourself.  Do circles at your neck just above your collarbones.  Repeat this 10 times.  Axilla - One at a  Time   Using full weight of flat hand and fingers at center of uninvolved (R) armpit, make _10__ in-place circles.   Copyright  VHI. All rights reserved.  LEG: Inguinal Nodes Stimulation   With small finger side of hand against hip crease on involved (L) side, gently perform circles at the crease. Repeat __10_ times.   Copyright  VHI. All rights reserved.  Axilla to Inguinal Nodes - Sweep   On involved side, stretch skin from armpit along side of trunk to hip crease.  Now gently stretch  skin from the involved side to the uninvolved side across the chest at the shoulder line.    Move fluid from L armpit in area that feels swollen towards these 2 pathways by gently stretching skin.   Finish by doing circles in R armpit and L groin.   Finish by doing the pathways as described above going from your involved armpit to the same side groin and going across your upper chest from the involved shoulder to the uninvolved shoulder.  Repeat the steps above where you do circles in your left groin and right armpit. Copyright  VHI. All rights reserved.    [facility], PT 01/07/2024, 5:05 PM

## 2024-01-09 ENCOUNTER — Encounter: Payer: Self-pay | Admitting: Physical Therapy

## 2024-01-09 ENCOUNTER — Ambulatory Visit: Payer: Self-pay | Admitting: Physical Therapy

## 2024-01-09 DIAGNOSIS — I890 Lymphedema, not elsewhere classified: Secondary | ICD-10-CM

## 2024-01-09 DIAGNOSIS — Z170 Estrogen receptor positive status [ER+]: Secondary | ICD-10-CM

## 2024-01-09 DIAGNOSIS — M25612 Stiffness of left shoulder, not elsewhere classified: Secondary | ICD-10-CM

## 2024-01-09 DIAGNOSIS — R293 Abnormal posture: Secondary | ICD-10-CM

## 2024-01-09 DIAGNOSIS — Z483 Aftercare following surgery for neoplasm: Secondary | ICD-10-CM

## 2024-01-09 NOTE — Therapy (Signed)
 OUTPATIENT PHYSICAL THERAPY BREAST CANCER TREATMENT   Patient Name: Norma [name] MRN: [id number] DOB:03/09/1971, 53 y.o., female Today's Date: 01/09/2024  END OF SESSION:  PT End of Session - 01/09/24 1605     Visit Number 32    Number of Visits 36    Date for Recertification  02/06/24    PT Start Time 1603    Activity Tolerance Patient tolerated treatment well    Behavior During Therapy [facility] for tasks assessed/performed             Past Medical History:  Diagnosis Date   Allergy    Anemia    hx of   Anxiety    Arthritis    KNEES   Asthma    treated for during COVID tx   Cancer (HCC) 06/2023   left breast IDC   Chronic headache    Colon polyps    TUBULAR ADENOMAS AND HYPERPLASTIC    Complication of anesthesia    DDD (degenerative disc disease), thoracic    Depression    Diabetes mellitus, type 2 (HCC)    Difficult airway for intubation    Fibromyalgia    GERD (gastroesophageal reflux disease)    Goiter    Hyperlipidemia    Hypertension    Low back pain    Obesity    PONV (postoperative nausea and vomiting)    RA (rheumatoid arthritis) (HCC)    Seasonal allergies    Vitamin D  deficiency    Past Surgical History:  Procedure Laterality Date   ABDOMINAL HYSTERECTOMY  2008   BACK SURGERY  2005   lumbar laminectomy   BALLOON DILATION N/A 06/01/2021   Procedure: BALLOON DILATION;  Surgeon: Albertus Gordy HERO, MD;  Location: [facility];  Service: Gastroenterology;  Laterality: N/A;   BARTHOLIN GLAND CYST EXCISION     BIOPSY  06/01/2021   Procedure: BIOPSY;  Surgeon: Albertus Gordy HERO, MD;  Location: [facility];  Service: Gastroenterology;;   BREAST LUMPECTOMY WITH RADIOACTIVE SEED AND SENTINEL LYMPH NODE BIOPSY Left 07/02/2023   Procedure: BREAST LUMPECTOMY WITH RADIOACTIVE SEED AND SENTINEL LYMPH NODE BIOPSY;  Surgeon: Aron Shoulders, MD;  Location: [facility];  Service: General;  Laterality: Left;  90 MIN [id number]- EXCISION LEFT CHEST WALL  MASS GEN COMBINED WITH REGIONAL   COLONOSCOPY  2017   JMP-MAC-prep good-TA -recall 5 yr   COLONOSCOPY WITH PROPOFOL  N/A 06/01/2021   Procedure: COLONOSCOPY WITH PROPOFOL ;  Surgeon: Albertus Gordy HERO, MD;  Location: [facility];  Service: Gastroenterology;  Laterality: N/A;   ESOPHAGOGASTRODUODENOSCOPY (EGD) WITH PROPOFOL  N/A 06/01/2021   Procedure: ESOPHAGOGASTRODUODENOSCOPY (EGD) WITH PROPOFOL ;  Surgeon: Albertus Gordy HERO, MD;  Location: [facility];  Service: Gastroenterology;  Laterality: N/A;   LUMBAR LAMINECTOMY/DECOMPRESSION MICRODISCECTOMY Right 07/17/2013   Procedure: LUMBAR LAMINECTOMY/DECOMPRESSION MICRODISCECTOMY 1 LEVEL,RIGHT LUMBAR FOUR-FIVE;  Surgeon: Darina MALVA Boehringer, MD;  Location: [facility];  Service: Neurosurgery;  Laterality: Right;  Right   MASS EXCISION Left 07/02/2023   Procedure: EXCISION, MASS, CHEST WALL;  Surgeon: Aron Shoulders, MD;  Location: [facility];  Service: General;  Laterality: Left;   PARTIAL HYSTERECTOMY     POLYPECTOMY  2017   TA and benign polypoid   POLYPECTOMY  06/01/2021   Procedure: POLYPECTOMY;  Surgeon: Albertus Gordy HERO, MD;  Location: [facility];  Service: Gastroenterology;;   WISDOM TOOTH EXTRACTION     Patient Active Problem List   Diagnosis Date Noted   Genetic testing 06/23/2023   Malignant neoplasm of upper-outer quadrant  of left breast in female, estrogen receptor positive (HCC) 06/11/2023   Gastritis without bleeding    Esophageal dysphagia    Schatzki's ring    Benign neoplasm of ascending colon    Class 3 severe obesity with serious comorbidity and body mass index (BMI) of 40.0 to 44.9 in adult [facility]) 08/25/2019   Type 2 diabetes mellitus without complication, without long-term current use of insulin (HCC) 08/25/2019   Chronic cough 06/27/2017   LUQ abdominal pain 12/07/2014   RUQ abdominal pain 11/10/2013   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   Gastroesophageal reflux disease 12/18/2012   Hx of adenomatous colonic polyps  12/18/2012   Chronic RLQ pain 12/18/2012   IBS (irritable bowel syndrome) 01/22/2011   HTN (hypertension) 01/22/2011   Hyperlipidemia 01/22/2011   Anxiety and depression 01/22/2011    PCP: Haze Servant, NP  REFERRING PROVIDER: Dr. Jina Nephew   REFERRING DIAG: Left breast cancer  THERAPY DIAG:  Lymphedema, not elsewhere classified - Plan: PT plan of care cert/re-cert  Stiffness of left shoulder, not elsewhere classified - Plan: PT plan of care cert/re-cert  Aftercare following surgery for neoplasm - Plan: PT plan of care cert/re-cert  Abnormal posture - Plan: PT plan of care cert/re-cert  Malignant neoplasm of upper-outer quadrant of left breast in female, estrogen receptor positive (HCC) - Plan: PT plan of care cert/re-cert  Rationale for Evaluation and Treatment: Rehabilitation  ONSET DATE: 04/26/23  SUBJECTIVE:                                                                                                                                                                                           SUBJECTIVE STATEMENT: The breast has been doing better today. The pain has been feeling better.   PERTINENT HISTORY:  Patient was diagnosed on 04/26/2023 with left grade 3 invasive ductal carcinoma breast cancer. It measures 1 cm and is located in the upper outer quadrant. It is ER/PR positive and HER2 negative with a Ki67 of 30%. Pt underwent a L lumpectomy and SLNB 0/2 on 07/02/23  PATIENT GOALS:  Reassess how my recovery is going related to arm function, pain, and swelling.  PAIN:  Are you having pain?  Pain in L breast, 1/10, dull pain, not touching it makes it better  PRECAUTIONS: Recent Surgery, left UE Lymphedema risk,   RED FLAGS: None   ACTIVITY LEVEL / LEISURE: doing post op exercises   OBJECTIVE:   PATIENT SURVEYS:  QUICK DASH:     OBSERVATIONS: Healing scars with some increased scar tissue at L axilla  POSTURE:  Forward head and rounded shoulders  posture   UPPER EXTREMITY  AROM/PROM:   A/PROM RIGHT   eval    Shoulder extension 48  Shoulder flexion 153  Shoulder abduction 165  Shoulder internal rotation 63  Shoulder external rotation 84                          (Blank rows = not tested)   A/PROM LEFT   eval LEFT  07/25/23 LEFT 08/08/23 LEFT 10/01/23  Shoulder extension 35 70    Shoulder flexion 150 162 171 176  Shoulder abduction 167 156 151 163  Shoulder internal rotation 63 69    Shoulder external rotation 75 80                            (Blank rows = not tested)   CERVICAL AROM: All within normal limits   UPPER EXTREMITY STRENGTH: WFL   LYMPHEDEMA ASSESSMENTS (in cm):    LANDMARK RIGHT   eval  10 cm proximal to olecranon process 38.8  Olecranon process 28.7  10 cm proximal to ulnar styloid process 25.8  Just proximal to ulnar styloid process 17  Across hand at thumb web space 20.5  At base of 2nd digit 5.9  (Blank rows = not tested)   LANDMARK LEFT   eval LEFT 07/25/23  10 cm proximal to olecranon process 40 38.4  Olecranon process 28.8 29.5  10 cm proximal to ulnar styloid process 24.3 23  Just proximal to ulnar styloid process 16.3 16.5  Across hand at thumb web space 19.5 20  At base of 2nd digit 5.9 5.8  (Blank rows = not tested)  Surgery type/Date: 07/02/23 L breast lumpectomy and SLNB Number of lymph nodes removed: 0/2 Current/past treatment (chemo, radiation, hormone therapy): does not need chemo, will require radiation, will need hormone therapy Other symptoms:  Heaviness/tightness Yes Pain Yes Pitting edema No Infections No Decreased scar mobility Yes Stemmer sign No     TREATMENT PERFORMED: 01/09/24 Manual Therapy: MLD: In supine: Short neck, 5 diaphragmatic breaths, R axillary nodes and establishment of interaxillary pathway, L inguinal nodes and establishment of axilloinguinal pathway, then L breast moving fluid towards pathways then to R sidelying working on L lateral trunk  moving fluid towards pathway at side and to posterior inter axillary pathway spending extra time in any areas of fibrosis at lateral breast then retracing all steps in supine. Palpated seroma today and it felt more firm and larger. .  STM to area of seroma at lateral breast and to lateral trunk with decreased pain noted afterwards  01/07/24 Manual Therapy: MLD: In supine: Short neck, 5 diaphragmatic breaths, R axillary nodes and establishment of interaxillary pathway, L inguinal nodes and establishment of axilloinguinal pathway, then L breast moving fluid towards pathways then to R sidelying working on L lateral trunk moving fluid towards pathway at side and to posterior inter axillary pathway spending extra time in any areas of fibrosis at lateral breast then retracing all steps in supine. Palpated seroma today and it felt more firm and larger. Palpated in supine and sitting.  STM to area of seroma at lateral breast and to lateral trunk with decreased pain noted afterwards  01/01/24 Manual Therapy: MLD: In supine: Short neck, 5 diaphragmatic breaths, R axillary nodes and establishment of interaxillary pathway, L inguinal nodes and establishment of axilloinguinal pathway, then L breast moving fluid towards pathways then to R sidelying working on L lateral trunk moving fluid towards pathway at  side and to posterior inter axillary pathway spending extra time in any areas of fibrosis at lateral breast then retracing all steps in supine. Area of fibrosis at lateral breast/trunk is softer and more pliable today. STM to area of seroma at lateral breast and area of fibrosis at lateral inferior breast which is also softer today  12/30/23 Manual Therapy: MLD: In supine: Short neck, 5 diaphragmatic breaths, R axillary nodes and establishment of interaxillary pathway, L inguinal nodes and establishment of axilloinguinal pathway, then L breast moving fluid towards pathways then to R sidelying working on L lateral  trunk moving fluid towards pathway at side and to posterior inter axillary pathway spending extra time in any areas of fibrosis at lateral breast then retracing all steps in supine.  STM to area of seroma at lateral breast and area of fibrosis at lateral inferior breast  12/26/23 Manual Therapy: MLD: In supine: Short neck, 5 diaphragmatic breaths, R axillary nodes and establishment of interaxillary pathway, L inguinal nodes and establishment of axilloinguinal pathway, then L breast moving fluid towards pathways then to R sidelying working on L lateral trunk moving fluid towards pathway at side and to posterior inter axillary pathway spending extra time in any areas of fibrosis at lateral breast then retracing all steps in supine.  STM to area of seroma at lateral breast and area of fibrosis at lateral inferior breast   12/24/23 Manual Therapy: MLD: In supine: Short neck, 5 diaphragmatic breaths, R axillary nodes and establishment of interaxillary pathway, L inguinal nodes and establishment of axilloinguinal pathway, then L breast moving fluid towards pathways then to R sidelying working on L lateral trunk moving fluid towards pathway at side and to posterior inter axillary pathway spending extra time in any areas of fibrosis at lateral breast then retracing all steps in supine.  STM to area of seroma at lateral breast - area felt softer today  12/18/23: Manual Therapy: Discussed taking the swell spot out of the bra if she has any discomfort or to readjust throughout the day MLD: In supine: Short neck, 5 diaphragmatic breaths, R axillary nodes and establishment of interaxillary pathway, L inguinal nodes and establishment of axilloinguinal pathway, then L breast moving fluid towards pathways spending extra time in any areas of fibrosis at lateral breast then retracing all steps. Spent increased time at medial and lateral breast where she had increased discomfort.   12/16/23: Manual  Therapy: Discussed how to wear the swell spot in the bra and how it is ok to sleep in it STM to area of seroma and fibrosis just lateral to areola with pitting edema noted in this area MLD: In supine: Short neck, 5 diaphragmatic breaths, R axillary nodes and establishment of interaxillary pathway, L inguinal nodes and establishment of axilloinguinal pathway, then L breast moving fluid towards pathways spending extra time in any areas of fibrosis at lateral breast then retracing all steps   12/11/23: Manual Therapy: Discussed how a seroma is different than lymphedema and the importnace of increasing compression to help the seroma heal Created foam chip pack and placed in thick stockinette for pt to wear in her bra for additional compression STM to area of seroma and fibrosis just lateral to areola with pitting edema noted in this area MLD: In supine: Short neck, 5 diaphragmatic breaths, R axillary nodes and establishment of interaxillary pathway, L inguinal nodes and establishment of axilloinguinal pathway, then L breast moving fluid towards pathways spending extra time in any areas of fibrosis at lateral  breast then retracing all steps  Issued info for pt to obtain a swell spot for her breast to help decrease lymphedema and heal seroma  11/26/23: Manual Therapy: In supine: Short neck, 5 diaphragmatic breaths, R axillary nodes and establishment of interaxillary pathway, L inguinal nodes and establishment of axilloinguinal pathway, then L breast moving fluid towards pathways spending extra time in any areas of fibrosis at lateral breast then retracing all steps  STM In R sidelying to L serratus and lateral breast in area of tightness/fibrosis with pt reporting increased tenderness today then back to supine to complete MLD  11/21/23: Manual Therapy: In supine: Short neck, 5 diaphragmatic breaths, R axillary nodes and establishment of interaxillary pathway, L inguinal nodes and establishment of  axilloinguinal pathway, then L breast moving fluid towards pathways spending extra time in any areas of fibrosis then retracing all steps then L UE working proximal to distal with increased time spent at axilla and upper arm then retracing all steps.  MFR to L axilla STM using cocoa butter and WAVE tool In R sidelying to L serratus and lateral breast in area of tightness/fibrosis with good improvement noted by end of session  11/19/23: Manual Therapy: In supine: Short neck, 5 diaphragmatic breaths, R axillary nodes and establishment of interaxillary pathway, L inguinal nodes and establishment of axilloinguinal pathway, then L breast moving fluid towards pathways spending extra time in any areas of fibrosis then retracing all steps then L UE working proximal to distal with increased time spent at axilla and upper arm then retracing all steps.  STM using cocoa butter In R sidelying to L serratus and lateral breast in area of tightness/fibrosis with good improvement noted by end of session  11/14/23: Manual Therapy: In supine: Short neck, 5 diaphragmatic breaths, R axillary nodes and establishment of interaxillary pathway, L inguinal nodes and establishment of axilloinguinal pathway, then L breast moving fluid towards pathways spending extra time in any areas of fibrosis then retracing all steps then L UE working proximal to distal with increased time spent at axilla and upper arm then retracing all steps.  STM using cocoa butter In R sidelying to L serratus and lateral breast in area of tightness/fibrosis with good improvement noted by end of session    The patient was assessed using the L-Dex machine today to produce a lymphedema index baseline score. The patient will be reassessed on a regular basis (typically every 3 months) to obtain new L-Dex scores. If the score is > 6.5 points away from his/her baseline score indicating onset of subclinical lymphedema, it will be recommended to wear a compression  garment for 4 weeks, 12 hours per day and then be reassessed. If the score continues to be > 6.5 points from baseline at reassessment, we will initiate lymphedema treatment. Assessing in this manner has a 95% rate of preventing clinically significant lymphedema.   11/12/23: Manual Therapy: In supine: Short neck, 5 diaphragmatic breaths, R axillary nodes and establishment of interaxillary pathway, L inguinal nodes and establishment of axilloinguinal pathway, then L breast moving fluid towards pathways spending extra time in any areas of fibrosis then retracing all steps then L UE working proximal to distal with increased time spent at axilla and upper arm then retracing all steps.  MFR to area of cording with UE in to abduction with cording more difficult to palpate but definite areas of tightness In R sidelying to L serratus and lateral breast in area of tightness/fibrosis   PATIENT EDUCATION:  Wear  compression bra 12 hrs/day for 4 weeks during day time hours Access Code: [email or web address] URL: [email or web address] Date: 08/06/2023 Prepared by: Grayce Sheldon  Exercises - Single Arm Doorway Pec Stretch at 90 Degrees Abduction  - 2 x daily - 7 x weekly - 1 sets - 3 reps - 20-30 hold - Supine Lower Trunk Rotation  - 1 x daily - 7 x weekly - 1 sets - 3 reps - 20-30 hold Education details: nerve desensitization, supine dowel exercises, lymphedema risk reduction and importance of skin care Person educated: Patient Education method: Explanation, Demonstration, Tactile cues, and Handouts Education comprehension: verbalized understanding and returned demonstration  HOME EXERCISE PROGRAM: Reviewed previously given post op HEP. Supine dowel exercises in to abduction and flexion  ASSESSMENT:  CLINICAL IMPRESSION: Pt is getting a new pair of shorts for her compression pump. She has been using her pump without the shorts. Her breast pain has improved from last session. Her fibrosis was better  but she was still fibrotic and tight at lateral trunk and across serratus so spent increased time in that area. She would benefit from continued skilled PT services to continue to decrease L breast pain to improve comfort and ensure independent management of lymphedema.   Pt will benefit from skilled therapeutic intervention to improve on the following deficits: Decreased knowledge of precautions, impaired UE functional use, pain, decreased ROM, postural dysfunction, increased edema.   PT treatment/interventions: ADL/Self care home management, [phone]- PT Re-evaluation, 97110-Therapeutic exercises, 97530- Therapeutic activity, [id number]- Neuromuscular re-education, 97535- Self Care, [id number]- Manual therapy, [id number]- Orthotic Initial, and [id number]- Orthotic/Prosthetic subsequent   GOALS: Goals reviewed with patient? Yes  LONG TERM GOALS:  (STG=LTG)  GOALS Name Target Date  Goal status  1 Pt will demonstrate she has regained full shoulder ROM and function post operatively compared to baselines.  Baseline: 08/22/23 MET  2 Pt will report no pain at end range of L shoulder abduction to allow improved function. 08/22/23 MET  3 Pt will be independent in a home exercise program for continued stretching and strengthening.  08/22/23 MET  4 Pt will be independent in self MLD for long term management of L breast lymphedema and swelling in L axilla to decrease risk of infection. 10/29/23 MET 10/29/23  5 Pt will report a 50% improvement in pain in L breast to allow improved comfort.  11/26/23 01/09/24- varies still, some days are better than others 11/26/23- ONGOING Varies - some days the pain is much better but then worsens   6 Pt will return to the green on the SOZO demonstrating reversal of subclinical lymphedema.  11/26/23 MET 11/14/23 returned to green  7 Pt will demonstrate a 75% improvement in pain and discomfort of L axilla from cording to allow improved comfort and function 11/26/23 MET 11/26/23  8 Pt will obtain a swell  spot for additional compression in her compression bra. 01/08/24 MET 01/09/24     PLAN:  PT FREQUENCY/DURATION: 1x/wk for 4 wks  PLAN FOR NEXT SESSION: Cont MLD to L breast and STM to seroma  [facility]  [address], [address]  [place] [address]  [phone]  Self manual lymph drainage: Do circles behind the collar bones x 10  Perform this sequence once a day.  Only give enough pressure no your skin to make the skin move.  Diaphragmatic - Supine   Inhale through nose making navel move out toward hands. Exhale through puckered lips, hands follow navel in. Repeat _5__ times. Rest _10__ seconds  between repeats.   Copyright  VHI. All rights reserved.  Hug yourself.  Do circles at your neck just above your collarbones.  Repeat this 10 times.  Axilla - One at a Time   Using full weight of flat hand and fingers at center of uninvolved (R) armpit, make _10__ in-place circles.   Copyright  VHI. All rights reserved.  LEG: Inguinal Nodes Stimulation   With small finger side of hand against hip crease on involved (L) side, gently perform circles at the crease. Repeat __10_ times.   Copyright  VHI. All rights reserved.  Axilla to Inguinal Nodes - Sweep   On involved side, stretch skin from armpit along side of trunk to hip crease.  Now gently stretch skin from the involved side to the uninvolved side across the chest at the shoulder line.    Move fluid from L armpit in area that feels swollen towards these 2 pathways by gently stretching skin.   Finish by doing circles in R armpit and L groin.   Finish by doing the pathways as described above going from your involved armpit to the same side groin and going across your upper chest from the involved shoulder to the uninvolved shoulder.  Repeat the steps above where you do circles in your left groin and right armpit. Copyright  VHI. All rights reserved.    [facility],  PT 01/09/2024, 5:00 PM

## 2024-01-14 ENCOUNTER — Encounter: Payer: Self-pay | Admitting: Physical Therapy

## 2024-01-14 ENCOUNTER — Ambulatory Visit: Payer: Self-pay | Attending: General Surgery | Admitting: Physical Therapy

## 2024-01-14 DIAGNOSIS — M25612 Stiffness of left shoulder, not elsewhere classified: Secondary | ICD-10-CM | POA: Diagnosis present

## 2024-01-14 DIAGNOSIS — I890 Lymphedema, not elsewhere classified: Secondary | ICD-10-CM | POA: Insufficient documentation

## 2024-01-14 DIAGNOSIS — Z170 Estrogen receptor positive status [ER+]: Secondary | ICD-10-CM | POA: Insufficient documentation

## 2024-01-14 DIAGNOSIS — C50412 Malignant neoplasm of upper-outer quadrant of left female breast: Secondary | ICD-10-CM | POA: Insufficient documentation

## 2024-01-14 DIAGNOSIS — R293 Abnormal posture: Secondary | ICD-10-CM | POA: Diagnosis present

## 2024-01-14 DIAGNOSIS — Z483 Aftercare following surgery for neoplasm: Secondary | ICD-10-CM | POA: Insufficient documentation

## 2024-01-14 NOTE — Therapy (Signed)
 OUTPATIENT PHYSICAL THERAPY BREAST CANCER TREATMENT   Patient Name: Norma [name] MRN: [id number] DOB:1970/04/12, 53 y.o., female Today's Date: 01/14/2024  END OF SESSION:  PT End of Session - 01/14/24 1652     Visit Number 33    Number of Visits 36    Date for Recertification  02/06/24    PT Start Time 1554    PT Stop Time 1653    PT Time Calculation (min) 59 min    Activity Tolerance Patient tolerated treatment well    Behavior During Therapy [facility] for tasks assessed/performed              Past Medical History:  Diagnosis Date   Allergy    Anemia    hx of   Anxiety    Arthritis    KNEES   Asthma    treated for during COVID tx   Cancer (HCC) 06/2023   left breast IDC   Chronic headache    Colon polyps    TUBULAR ADENOMAS AND HYPERPLASTIC    Complication of anesthesia    DDD (degenerative disc disease), thoracic    Depression    Diabetes mellitus, type 2 (HCC)    Difficult airway for intubation    Fibromyalgia    GERD (gastroesophageal reflux disease)    Goiter    Hyperlipidemia    Hypertension    Low back pain    Obesity    PONV (postoperative nausea and vomiting)    RA (rheumatoid arthritis) (HCC)    Seasonal allergies    Vitamin D  deficiency    Past Surgical History:  Procedure Laterality Date   ABDOMINAL HYSTERECTOMY  2008   BACK SURGERY  2005   lumbar laminectomy   BALLOON DILATION N/A 06/01/2021   Procedure: BALLOON DILATION;  Surgeon: Albertus Gordy HERO, MD;  Location: [facility];  Service: Gastroenterology;  Laterality: N/A;   BARTHOLIN GLAND CYST EXCISION     BIOPSY  06/01/2021   Procedure: BIOPSY;  Surgeon: Albertus Gordy HERO, MD;  Location: [facility];  Service: Gastroenterology;;   BREAST LUMPECTOMY WITH RADIOACTIVE SEED AND SENTINEL LYMPH NODE BIOPSY Left 07/02/2023   Procedure: BREAST LUMPECTOMY WITH RADIOACTIVE SEED AND SENTINEL LYMPH NODE BIOPSY;  Surgeon: Aron Shoulders, MD;  Location: [facility];  Service: General;   Laterality: Left;  90 MIN 78444- EXCISION LEFT CHEST WALL MASS GEN COMBINED WITH REGIONAL   COLONOSCOPY  2017   JMP-MAC-prep good-TA -recall 5 yr   COLONOSCOPY WITH PROPOFOL  N/A 06/01/2021   Procedure: COLONOSCOPY WITH PROPOFOL ;  Surgeon: Albertus Gordy HERO, MD;  Location: [facility];  Service: Gastroenterology;  Laterality: N/A;   ESOPHAGOGASTRODUODENOSCOPY (EGD) WITH PROPOFOL  N/A 06/01/2021   Procedure: ESOPHAGOGASTRODUODENOSCOPY (EGD) WITH PROPOFOL ;  Surgeon: Albertus Gordy HERO, MD;  Location: [facility];  Service: Gastroenterology;  Laterality: N/A;   LUMBAR LAMINECTOMY/DECOMPRESSION MICRODISCECTOMY Right 07/17/2013   Procedure: LUMBAR LAMINECTOMY/DECOMPRESSION MICRODISCECTOMY 1 LEVEL,RIGHT LUMBAR FOUR-FIVE;  Surgeon: Darina MALVA Boehringer, MD;  Location: [facility];  Service: Neurosurgery;  Laterality: Right;  Right   MASS EXCISION Left 07/02/2023   Procedure: EXCISION, MASS, CHEST WALL;  Surgeon: Aron Shoulders, MD;  Location: [facility];  Service: General;  Laterality: Left;   PARTIAL HYSTERECTOMY     POLYPECTOMY  2017   TA and benign polypoid   POLYPECTOMY  06/01/2021   Procedure: POLYPECTOMY;  Surgeon: Albertus Gordy HERO, MD;  Location: [facility];  Service: Gastroenterology;;   WISDOM TOOTH EXTRACTION     Patient Active Problem List  Diagnosis Date Noted   Genetic testing 06/23/2023   Malignant neoplasm of upper-outer quadrant of left breast in female, estrogen receptor positive (HCC) 06/11/2023   Gastritis without bleeding    Esophageal dysphagia    Schatzki's ring    Benign neoplasm of ascending colon    Class 3 severe obesity with serious comorbidity and body mass index (BMI) of 40.0 to 44.9 in adult (HCC) 08/25/2019   Type 2 diabetes mellitus without complication, without long-term current use of insulin (HCC) 08/25/2019   Chronic cough 06/27/2017   LUQ abdominal pain 12/07/2014   RUQ abdominal pain 11/10/2013   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   Gastroesophageal  reflux disease 12/18/2012   Hx of adenomatous colonic polyps 12/18/2012   Chronic RLQ pain 12/18/2012   IBS (irritable bowel syndrome) 01/22/2011   HTN (hypertension) 01/22/2011   Hyperlipidemia 01/22/2011   Anxiety and depression 01/22/2011    PCP: Haze Servant, NP  REFERRING PROVIDER: Dr. Jina Nephew   REFERRING DIAG: Left breast cancer  THERAPY DIAG:  Lymphedema, not elsewhere classified  Stiffness of left shoulder, not elsewhere classified  Aftercare following surgery for neoplasm  Abnormal posture  Malignant neoplasm of upper-outer quadrant of left breast in female, estrogen receptor positive (HCC)  Rationale for Evaluation and Treatment: Rehabilitation  ONSET DATE: 04/26/23  SUBJECTIVE:                                                                                                                                                                                           SUBJECTIVE STATEMENT: I put my sleeve and swell spot on yesterday. I had to take it off because it felt irritated. Then my bra started irritating me. I put on the compression tank instead of the bra and it felt great. I tried wearing the bra with the swell spot today and my skin started feeling irritated again. I took it off again.   PERTINENT HISTORY:  Patient was diagnosed on 04/26/2023 with left grade 3 invasive ductal carcinoma breast cancer. It measures 1 cm and is located in the upper outer quadrant. It is ER/PR positive and HER2 negative with a Ki67 of 30%. Pt underwent a L lumpectomy and SLNB 0/2 on 07/02/23  PATIENT GOALS:  Reassess how my recovery is going related to arm function, pain, and swelling.  PAIN:  Are you having pain?  None  PRECAUTIONS: Recent Surgery, left UE Lymphedema risk,   RED FLAGS: None   ACTIVITY LEVEL / LEISURE: doing post op exercises   OBJECTIVE:   PATIENT SURVEYS:  QUICK DASH:     OBSERVATIONS: Healing scars with some increased scar tissue at  L  axilla  POSTURE:  Forward head and rounded shoulders posture   UPPER EXTREMITY AROM/PROM:   A/PROM RIGHT   eval    Shoulder extension 48  Shoulder flexion 153  Shoulder abduction 165  Shoulder internal rotation 63  Shoulder external rotation 84                          (Blank rows = not tested)   A/PROM LEFT   eval LEFT  07/25/23 LEFT 08/08/23 LEFT 10/01/23  Shoulder extension 35 70    Shoulder flexion 150 162 171 176  Shoulder abduction 167 156 151 163  Shoulder internal rotation 63 69    Shoulder external rotation 75 80                            (Blank rows = not tested)   CERVICAL AROM: All within normal limits   UPPER EXTREMITY STRENGTH: WFL   LYMPHEDEMA ASSESSMENTS (in cm):    LANDMARK RIGHT   eval  10 cm proximal to olecranon process 38.8  Olecranon process 28.7  10 cm proximal to ulnar styloid process 25.8  Just proximal to ulnar styloid process 17  Across hand at thumb web space 20.5  At base of 2nd digit 5.9  (Blank rows = not tested)   LANDMARK LEFT   eval LEFT 07/25/23  10 cm proximal to olecranon process 40 38.4  Olecranon process 28.8 29.5  10 cm proximal to ulnar styloid process 24.3 23  Just proximal to ulnar styloid process 16.3 16.5  Across hand at thumb web space 19.5 20  At base of 2nd digit 5.9 5.8  (Blank rows = not tested)  Surgery type/Date: 07/02/23 L breast lumpectomy and SLNB Number of lymph nodes removed: 0/2 Current/past treatment (chemo, radiation, hormone therapy): does not need chemo, will require radiation, will need hormone therapy Other symptoms:  Heaviness/tightness Yes Pain Yes Pitting edema No Infections No Decreased scar mobility Yes Stemmer sign No     TREATMENT PERFORMED: 01/14/24 SOZO screening repeated:  L-DEX FLOWSHEETS - 01/14/24 1600       L-DEX LYMPHEDEMA SCREENING   Measurement Type Unilateral    L-DEX MEASUREMENT EXTREMITY Upper Extremity    POSITION  Standing    DOMINANT SIDE Right    At Risk  Side Left    BASELINE SCORE (UNILATERAL) -0.7    L-DEX SCORE (UNILATERAL) 6.2    VALUE CHANGE (UNILAT) 6.9         The patient was assessed using the L-Dex machine today to produce a lymphedema index baseline score. The patient will be reassessed on a regular basis (typically every 3 months) to obtain new L-Dex scores. If the score is > 6.5 points away from his/her baseline score indicating onset of subclinical lymphedema, it will be recommended to wear a compression garment for 4 weeks, 12 hours per day and then be reassessed. If the score continues to be > 6.5 points from baseline at reassessment, we will initiate lymphedema treatment. Assessing in this manner has a 95% rate of preventing clinically significant lymphedema. Educated pt to begin wearing her compression sleeve consistently every day during waking hours Manual Therapy: MLD: In supine: Short neck, 5 diaphragmatic breaths, R axillary nodes and establishment of interaxillary pathway, L inguinal nodes and establishment of axilloinguinal pathway, then L breast moving fluid towards pathways, L UE moving fluid towards pathways, then to R sidelying working  on L lateral trunk moving fluid towards pathway at side and to posterior inter axillary pathway spending extra time in any areas of fibrosis at lateral breast then retracing all steps in supine. Seroma felt softer today and breast lymphedema was visibly reduced. STM to area of seroma at lateral breast and to lateral trunk  01/09/24 Manual Therapy: MLD: In supine: Short neck, 5 diaphragmatic breaths, R axillary nodes and establishment of interaxillary pathway, L inguinal nodes and establishment of axilloinguinal pathway, then L breast moving fluid towards pathways then to R sidelying working on L lateral trunk moving fluid towards pathway at side and to posterior inter axillary pathway spending extra time in any areas of fibrosis at lateral breast then retracing all steps in supine. Palpated  seroma today and it felt more firm and larger. .  STM to area of seroma at lateral breast and to lateral trunk with decreased pain noted afterwards  01/07/24 Manual Therapy: MLD: In supine: Short neck, 5 diaphragmatic breaths, R axillary nodes and establishment of interaxillary pathway, L inguinal nodes and establishment of axilloinguinal pathway, then L breast moving fluid towards pathways then to R sidelying working on L lateral trunk moving fluid towards pathway at side and to posterior inter axillary pathway spending extra time in any areas of fibrosis at lateral breast then retracing all steps in supine. Palpated seroma today and it felt more firm and larger. Palpated in supine and sitting.  STM to area of seroma at lateral breast and to lateral trunk with decreased pain noted afterwards  01/01/24 Manual Therapy: MLD: In supine: Short neck, 5 diaphragmatic breaths, R axillary nodes and establishment of interaxillary pathway, L inguinal nodes and establishment of axilloinguinal pathway, then L breast moving fluid towards pathways then to R sidelying working on L lateral trunk moving fluid towards pathway at side and to posterior inter axillary pathway spending extra time in any areas of fibrosis at lateral breast then retracing all steps in supine. Area of fibrosis at lateral breast/trunk is softer and more pliable today. STM to area of seroma at lateral breast and area of fibrosis at lateral inferior breast which is also softer today  12/30/23 Manual Therapy: MLD: In supine: Short neck, 5 diaphragmatic breaths, R axillary nodes and establishment of interaxillary pathway, L inguinal nodes and establishment of axilloinguinal pathway, then L breast moving fluid towards pathways then to R sidelying working on L lateral trunk moving fluid towards pathway at side and to posterior inter axillary pathway spending extra time in any areas of fibrosis at lateral breast then retracing all steps in supine.   STM to area of seroma at lateral breast and area of fibrosis at lateral inferior breast  12/26/23 Manual Therapy: MLD: In supine: Short neck, 5 diaphragmatic breaths, R axillary nodes and establishment of interaxillary pathway, L inguinal nodes and establishment of axilloinguinal pathway, then L breast moving fluid towards pathways then to R sidelying working on L lateral trunk moving fluid towards pathway at side and to posterior inter axillary pathway spending extra time in any areas of fibrosis at lateral breast then retracing all steps in supine.  STM to area of seroma at lateral breast and area of fibrosis at lateral inferior breast   12/24/23 Manual Therapy: MLD: In supine: Short neck, 5 diaphragmatic breaths, R axillary nodes and establishment of interaxillary pathway, L inguinal nodes and establishment of axilloinguinal pathway, then L breast moving fluid towards pathways then to R sidelying working on L lateral trunk moving fluid towards pathway  at side and to posterior inter axillary pathway spending extra time in any areas of fibrosis at lateral breast then retracing all steps in supine.  STM to area of seroma at lateral breast - area felt softer today  12/18/23: Manual Therapy: Discussed taking the swell spot out of the bra if she has any discomfort or to readjust throughout the day MLD: In supine: Short neck, 5 diaphragmatic breaths, R axillary nodes and establishment of interaxillary pathway, L inguinal nodes and establishment of axilloinguinal pathway, then L breast moving fluid towards pathways spending extra time in any areas of fibrosis at lateral breast then retracing all steps. Spent increased time at medial and lateral breast where she had increased discomfort.   12/16/23: Manual Therapy: Discussed how to wear the swell spot in the bra and how it is ok to sleep in it STM to area of seroma and fibrosis just lateral to areola with pitting edema noted in this area MLD: In  supine: Short neck, 5 diaphragmatic breaths, R axillary nodes and establishment of interaxillary pathway, L inguinal nodes and establishment of axilloinguinal pathway, then L breast moving fluid towards pathways spending extra time in any areas of fibrosis at lateral breast then retracing all steps   12/11/23: Manual Therapy: Discussed how a seroma is different than lymphedema and the importnace of increasing compression to help the seroma heal Created foam chip pack and placed in thick stockinette for pt to wear in her bra for additional compression STM to area of seroma and fibrosis just lateral to areola with pitting edema noted in this area MLD: In supine: Short neck, 5 diaphragmatic breaths, R axillary nodes and establishment of interaxillary pathway, L inguinal nodes and establishment of axilloinguinal pathway, then L breast moving fluid towards pathways spending extra time in any areas of fibrosis at lateral breast then retracing all steps  Issued info for pt to obtain a swell spot for her breast to help decrease lymphedema and heal seroma  11/26/23: Manual Therapy: In supine: Short neck, 5 diaphragmatic breaths, R axillary nodes and establishment of interaxillary pathway, L inguinal nodes and establishment of axilloinguinal pathway, then L breast moving fluid towards pathways spending extra time in any areas of fibrosis at lateral breast then retracing all steps  STM In R sidelying to L serratus and lateral breast in area of tightness/fibrosis with pt reporting increased tenderness today then back to supine to complete MLD  11/21/23: Manual Therapy: In supine: Short neck, 5 diaphragmatic breaths, R axillary nodes and establishment of interaxillary pathway, L inguinal nodes and establishment of axilloinguinal pathway, then L breast moving fluid towards pathways spending extra time in any areas of fibrosis then retracing all steps then L UE working proximal to distal with increased time spent at  axilla and upper arm then retracing all steps.  MFR to L axilla STM using cocoa butter and WAVE tool In R sidelying to L serratus and lateral breast in area of tightness/fibrosis with good improvement noted by end of session  11/19/23: Manual Therapy: In supine: Short neck, 5 diaphragmatic breaths, R axillary nodes and establishment of interaxillary pathway, L inguinal nodes and establishment of axilloinguinal pathway, then L breast moving fluid towards pathways spending extra time in any areas of fibrosis then retracing all steps then L UE working proximal to distal with increased time spent at axilla and upper arm then retracing all steps.  STM using cocoa butter In R sidelying to L serratus and lateral breast in area of tightness/fibrosis with  good improvement noted by end of session  11/14/23: Manual Therapy: In supine: Short neck, 5 diaphragmatic breaths, R axillary nodes and establishment of interaxillary pathway, L inguinal nodes and establishment of axilloinguinal pathway, then L breast moving fluid towards pathways spending extra time in any areas of fibrosis then retracing all steps then L UE working proximal to distal with increased time spent at axilla and upper arm then retracing all steps.  STM using cocoa butter In R sidelying to L serratus and lateral breast in area of tightness/fibrosis with good improvement noted by end of session   L-DEX FLOWSHEETS - 01/14/24 1600       L-DEX LYMPHEDEMA SCREENING   Measurement Type Unilateral    L-DEX MEASUREMENT EXTREMITY Upper Extremity    POSITION  Standing    DOMINANT SIDE Right    At Risk Side Left    BASELINE SCORE (UNILATERAL) -0.7    L-DEX SCORE (UNILATERAL) 6.2    VALUE CHANGE (UNILAT) 6.9          The patient was assessed using the L-Dex machine today to produce a lymphedema index baseline score. The patient will be reassessed on a regular basis (typically every 3 months) to obtain new L-Dex scores. If the score is > 6.5 points  away from his/her baseline score indicating onset of subclinical lymphedema, it will be recommended to wear a compression garment for 4 weeks, 12 hours per day and then be reassessed. If the score continues to be > 6.5 points from baseline at reassessment, we will initiate lymphedema treatment. Assessing in this manner has a 95% rate of preventing clinically significant lymphedema.   11/12/23: Manual Therapy: In supine: Short neck, 5 diaphragmatic breaths, R axillary nodes and establishment of interaxillary pathway, L inguinal nodes and establishment of axilloinguinal pathway, then L breast moving fluid towards pathways spending extra time in any areas of fibrosis then retracing all steps then L UE working proximal to distal with increased time spent at axilla and upper arm then retracing all steps.  MFR to area of cording with UE in to abduction with cording more difficult to palpate but definite areas of tightness In R sidelying to L serratus and lateral breast in area of tightness/fibrosis  L-DEX FLOWSHEETS - 01/14/24 1600       L-DEX LYMPHEDEMA SCREENING   Measurement Type Unilateral    L-DEX MEASUREMENT EXTREMITY Upper Extremity    POSITION  Standing    DOMINANT SIDE Right    At Risk Side Left    BASELINE SCORE (UNILATERAL) -0.7    L-DEX SCORE (UNILATERAL) 6.2    VALUE CHANGE (UNILAT) 6.9          PATIENT EDUCATION:  Wear compression bra 12 hrs/day for 4 weeks during day time hours Access Code: [email or web address] URL: [email or web address] Date: 08/06/2023 Prepared by: Grayce Sheldon  Exercises - Single Arm Doorway Pec Stretch at 90 Degrees Abduction  - 2 x daily - 7 x weekly - 1 sets - 3 reps - 20-30 hold - Supine Lower Trunk Rotation  - 1 x daily - 7 x weekly - 1 sets - 3 reps - 20-30 hold Education details: nerve desensitization, supine dowel exercises, lymphedema risk reduction and importance of skin care Person educated: Patient Education method: Explanation,  Demonstration, Tactile cues, and Handouts Education comprehension: verbalized understanding and returned demonstration  HOME EXERCISE PROGRAM: Reviewed previously given post op HEP. Supine dowel exercises in to abduction and flexion  ASSESSMENT:  CLINICAL IMPRESSION: Repeated SOZO today. She was  up in August and wore her sleeve 12hrs/day for 4 weeks and returned to the green. Repeated today and she was back in the yellow. She has been wearing her sleeve but not every day for 12 hours. Educated pt to return to this. Her breast is softer today with less fibrosis present. Her seroma also felt softer and smaller. Her pain has been improved. She has been using her compression pump. Began MLD to LUE today in additional to breast MLD.   Pt will benefit from skilled therapeutic intervention to improve on the following deficits: Decreased knowledge of precautions, impaired UE functional use, pain, decreased ROM, postural dysfunction, increased edema.   PT treatment/interventions: ADL/Self care home management, [phone]- PT Re-evaluation, 97110-Therapeutic exercises, 97530- Therapeutic activity, [id number]- Neuromuscular re-education, 97535- Self Care, 02859- Manual therapy, [id number]- Orthotic Initial, and [id number]- Orthotic/Prosthetic subsequent   GOALS: Goals reviewed with patient? Yes  LONG TERM GOALS:  (STG=LTG)  GOALS Name Target Date  Goal status  1 Pt will demonstrate she has regained full shoulder ROM and function post operatively compared to baselines.  Baseline: 08/22/23 MET  2 Pt will report no pain at end range of L shoulder abduction to allow improved function. 08/22/23 MET  3 Pt will be independent in a home exercise program for continued stretching and strengthening.  08/22/23 MET  4 Pt will be independent in self MLD for long term management of L breast lymphedema and swelling in L axilla to decrease risk of infection. 10/29/23 MET 10/29/23  5 Pt will report a 50% improvement in pain in L breast to  allow improved comfort.  11/26/23 01/09/24- varies still, some days are better than others 11/26/23- ONGOING Varies - some days the pain is much better but then worsens   6 Pt will return to the green on the SOZO demonstrating reversal of subclinical lymphedema.  11/26/23 MET 11/14/23 returned to green  7 Pt will demonstrate a 75% improvement in pain and discomfort of L axilla from cording to allow improved comfort and function 11/26/23 MET 11/26/23  8 Pt will obtain a swell spot for additional compression in her compression bra. 01/08/24 MET 01/09/24     PLAN:  PT FREQUENCY/DURATION: 1x/wk for 4 wks  PLAN FOR NEXT SESSION: Cont MLD to L breast and STM to seroma  [facility]  [address], [address]  [place] [address]  [phone]  Self manual lymph drainage: Do circles behind the collar bones x 10  Perform this sequence once a day.  Only give enough pressure no your skin to make the skin move.  Diaphragmatic - Supine   Inhale through nose making navel move out toward hands. Exhale through puckered lips, hands follow navel in. Repeat _5__ times. Rest _10__ seconds between repeats.   Copyright  VHI. All rights reserved.  Hug yourself.  Do circles at your neck just above your collarbones.  Repeat this 10 times.  Axilla - One at a Time   Using full weight of flat hand and fingers at center of uninvolved (R) armpit, make _10__ in-place circles.   Copyright  VHI. All rights reserved.  LEG: Inguinal Nodes Stimulation   With small finger side of hand against hip crease on involved (L) side, gently perform circles at the crease. Repeat __10_ times.   Copyright  VHI. All rights reserved.  Axilla to Inguinal Nodes - Sweep   On involved side, stretch skin from armpit along side of trunk to hip crease.  Now gently stretch  skin from the involved side to the uninvolved side across the chest at the shoulder line.    Move fluid from L armpit in area that  feels swollen towards these 2 pathways by gently stretching skin.   Finish by doing circles in R armpit and L groin.   Finish by doing the pathways as described above going from your involved armpit to the same side groin and going across your upper chest from the involved shoulder to the uninvolved shoulder.  Repeat the steps above where you do circles in your left groin and right armpit. Copyright  VHI. All rights reserved.    Florina Sever [place], PT 01/14/2024, 4:58 PM

## 2024-01-20 ENCOUNTER — Ambulatory Visit: Payer: Self-pay | Admitting: Physical Therapy

## 2024-01-20 ENCOUNTER — Encounter: Payer: Self-pay | Admitting: Physical Therapy

## 2024-01-20 DIAGNOSIS — I890 Lymphedema, not elsewhere classified: Secondary | ICD-10-CM | POA: Diagnosis not present

## 2024-01-20 DIAGNOSIS — C50412 Malignant neoplasm of upper-outer quadrant of left female breast: Secondary | ICD-10-CM

## 2024-01-20 DIAGNOSIS — Z483 Aftercare following surgery for neoplasm: Secondary | ICD-10-CM

## 2024-01-20 DIAGNOSIS — M25612 Stiffness of left shoulder, not elsewhere classified: Secondary | ICD-10-CM

## 2024-01-20 DIAGNOSIS — R293 Abnormal posture: Secondary | ICD-10-CM

## 2024-01-20 NOTE — Therapy (Signed)
 OUTPATIENT PHYSICAL THERAPY BREAST CANCER TREATMENT   Patient Name: Norma [name] MRN: [id number] DOB:Nov 29, 1970, 53 y.o., female Today's Date: 01/20/2024  END OF SESSION:  PT End of Session - 01/20/24 1652     Visit Number 34    Number of Visits 36    Date for Recertification  02/06/24    PT Start Time 1556    PT Stop Time 1652    PT Time Calculation (min) 56 min    Activity Tolerance Patient tolerated treatment well    Behavior During Therapy [facility] for tasks assessed/performed              Past Medical History:  Diagnosis Date   Allergy    Anemia    hx of   Anxiety    Arthritis    KNEES   Asthma    treated for during COVID tx   Cancer (HCC) 06/2023   left breast IDC   Chronic headache    Colon polyps    TUBULAR ADENOMAS AND HYPERPLASTIC    Complication of anesthesia    DDD (degenerative disc disease), thoracic    Depression    Diabetes mellitus, type 2 (HCC)    Difficult airway for intubation    Fibromyalgia    GERD (gastroesophageal reflux disease)    Goiter    Hyperlipidemia    Hypertension    Low back pain    Obesity    PONV (postoperative nausea and vomiting)    RA (rheumatoid arthritis) (HCC)    Seasonal allergies    Vitamin D  deficiency    Past Surgical History:  Procedure Laterality Date   ABDOMINAL HYSTERECTOMY  2008   BACK SURGERY  2005   lumbar laminectomy   BALLOON DILATION N/A 06/01/2021   Procedure: BALLOON DILATION;  Surgeon: Albertus Gordy HERO, MD;  Location: [facility];  Service: Gastroenterology;  Laterality: N/A;   BARTHOLIN GLAND CYST EXCISION     BIOPSY  06/01/2021   Procedure: BIOPSY;  Surgeon: Albertus Gordy HERO, MD;  Location: [facility];  Service: Gastroenterology;;   BREAST LUMPECTOMY WITH RADIOACTIVE SEED AND SENTINEL LYMPH NODE BIOPSY Left 07/02/2023   Procedure: BREAST LUMPECTOMY WITH RADIOACTIVE SEED AND SENTINEL LYMPH NODE BIOPSY;  Surgeon: Aron Shoulders, MD;  Location: [facility];  Service: General;   Laterality: Left;  90 MIN 78444- EXCISION LEFT CHEST WALL MASS GEN COMBINED WITH REGIONAL   COLONOSCOPY  2017   JMP-MAC-prep good-TA -recall 5 yr   COLONOSCOPY WITH PROPOFOL  N/A 06/01/2021   Procedure: COLONOSCOPY WITH PROPOFOL ;  Surgeon: Albertus Gordy HERO, MD;  Location: [facility];  Service: Gastroenterology;  Laterality: N/A;   ESOPHAGOGASTRODUODENOSCOPY (EGD) WITH PROPOFOL  N/A 06/01/2021   Procedure: ESOPHAGOGASTRODUODENOSCOPY (EGD) WITH PROPOFOL ;  Surgeon: Albertus Gordy HERO, MD;  Location: [facility];  Service: Gastroenterology;  Laterality: N/A;   LUMBAR LAMINECTOMY/DECOMPRESSION MICRODISCECTOMY Right 07/17/2013   Procedure: LUMBAR LAMINECTOMY/DECOMPRESSION MICRODISCECTOMY 1 LEVEL,RIGHT LUMBAR FOUR-FIVE;  Surgeon: Darina MALVA Boehringer, MD;  Location: [facility];  Service: Neurosurgery;  Laterality: Right;  Right   MASS EXCISION Left 07/02/2023   Procedure: EXCISION, MASS, CHEST WALL;  Surgeon: Aron Shoulders, MD;  Location: [facility];  Service: General;  Laterality: Left;   PARTIAL HYSTERECTOMY     POLYPECTOMY  2017   TA and benign polypoid   POLYPECTOMY  06/01/2021   Procedure: POLYPECTOMY;  Surgeon: Albertus Gordy HERO, MD;  Location: [facility];  Service: Gastroenterology;;   WISDOM TOOTH EXTRACTION     Patient Active Problem List  Diagnosis Date Noted   Genetic testing 06/23/2023   Malignant neoplasm of upper-outer quadrant of left breast in female, estrogen receptor positive (HCC) 06/11/2023   Gastritis without bleeding    Esophageal dysphagia    Schatzki's ring    Benign neoplasm of ascending colon    Class 3 severe obesity with serious comorbidity and body mass index (BMI) of 40.0 to 44.9 in adult (HCC) 08/25/2019   Type 2 diabetes mellitus without complication, without long-term current use of insulin (HCC) 08/25/2019   Chronic cough 06/27/2017   LUQ abdominal pain 12/07/2014   RUQ abdominal pain 11/10/2013   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   Gastroesophageal  reflux disease 12/18/2012   Hx of adenomatous colonic polyps 12/18/2012   Chronic RLQ pain 12/18/2012   IBS (irritable bowel syndrome) 01/22/2011   HTN (hypertension) 01/22/2011   Hyperlipidemia 01/22/2011   Anxiety and depression 01/22/2011    PCP: Haze Servant, NP  REFERRING PROVIDER: Dr. Jina Nephew   REFERRING DIAG: Left breast cancer  THERAPY DIAG:  Lymphedema, not elsewhere classified  Stiffness of left shoulder, not elsewhere classified  Aftercare following surgery for neoplasm  Abnormal posture  Malignant neoplasm of upper-outer quadrant of left breast in female, estrogen receptor positive (HCC)  Rationale for Evaluation and Treatment: Rehabilitation  ONSET DATE: 04/26/23  SUBJECTIVE:                                                                                                                                                                                           SUBJECTIVE STATEMENT: The pain in my breast has been pretty good.   PERTINENT HISTORY:  Patient was diagnosed on 04/26/2023 with left grade 3 invasive ductal carcinoma breast cancer. It measures 1 cm and is located in the upper outer quadrant. It is ER/PR positive and HER2 negative with a Ki67 of 30%. Pt underwent a L lumpectomy and SLNB 0/2 on 07/02/23  PATIENT GOALS:  Reassess how my recovery is going related to arm function, pain, and swelling.  PAIN:  Are you having pain?  1/10, L breast, nothing really makes it worse or better  PRECAUTIONS: Recent Surgery, left UE Lymphedema risk,   RED FLAGS: None   ACTIVITY LEVEL / LEISURE: doing post op exercises   OBJECTIVE:   PATIENT SURVEYS:  QUICK DASH:     OBSERVATIONS: Healing scars with some increased scar tissue at L axilla  POSTURE:  Forward head and rounded shoulders posture   UPPER EXTREMITY AROM/PROM:   A/PROM RIGHT   eval    Shoulder extension 48  Shoulder flexion 153  Shoulder abduction 165  Shoulder internal rotation  63  Shoulder external rotation 84                          (Blank rows = not tested)   A/PROM LEFT   eval LEFT  07/25/23 LEFT 08/08/23 LEFT 10/01/23  Shoulder extension 35 70    Shoulder flexion 150 162 171 176  Shoulder abduction 167 156 151 163  Shoulder internal rotation 63 69    Shoulder external rotation 75 80                            (Blank rows = not tested)   CERVICAL AROM: All within normal limits   UPPER EXTREMITY STRENGTH: WFL   LYMPHEDEMA ASSESSMENTS (in cm):    LANDMARK RIGHT   eval  10 cm proximal to olecranon process 38.8  Olecranon process 28.7  10 cm proximal to ulnar styloid process 25.8  Just proximal to ulnar styloid process 17  Across hand at thumb web space 20.5  At base of 2nd digit 5.9  (Blank rows = not tested)   LANDMARK LEFT   eval LEFT 07/25/23  10 cm proximal to olecranon process 40 38.4  Olecranon process 28.8 29.5  10 cm proximal to ulnar styloid process 24.3 23  Just proximal to ulnar styloid process 16.3 16.5  Across hand at thumb web space 19.5 20  At base of 2nd digit 5.9 5.8  (Blank rows = not tested)  Surgery type/Date: 07/02/23 L breast lumpectomy and SLNB Number of lymph nodes removed: 0/2 Current/past treatment (chemo, radiation, hormone therapy): does not need chemo, will require radiation, will need hormone therapy Other symptoms:  Heaviness/tightness Yes Pain Yes Pitting edema No Infections No Decreased scar mobility Yes Stemmer sign No     TREATMENT PERFORMED: 01/20/24 Manual Therapy: MLD: In supine: Short neck, 5 diaphragmatic breaths, R axillary nodes and establishment of interaxillary pathway, L inguinal nodes and establishment of axilloinguinal pathway, then L breast moving fluid towards pathways, L UE moving fluid towards pathways, then to R sidelying working on L lateral trunk moving fluid towards pathway at side and to posterior inter axillary pathway spending extra time in any areas of fibrosis at lateral  breast then retracing all steps in supine. Seroma felt softer today and breast lymphedema was slight more fibrotic STM to area of seroma at lateral breast and to lateral trunk  01/14/24 SOZO screening repeated:   The patient was assessed using the L-Dex machine today to produce a lymphedema index baseline score. The patient will be reassessed on a regular basis (typically every 3 months) to obtain new L-Dex scores. If the score is > 6.5 points away from his/her baseline score indicating onset of subclinical lymphedema, it will be recommended to wear a compression garment for 4 weeks, 12 hours per day and then be reassessed. If the score continues to be > 6.5 points from baseline at reassessment, we will initiate lymphedema treatment. Assessing in this manner has a 95% rate of preventing clinically significant lymphedema. Educated pt to begin wearing her compression sleeve consistently every day during waking hours Manual Therapy: MLD: In supine: Short neck, 5 diaphragmatic breaths, R axillary nodes and establishment of interaxillary pathway, L inguinal nodes and establishment of axilloinguinal pathway, then L breast moving fluid towards pathways, L UE moving fluid towards pathways, then to R sidelying working on L lateral trunk moving fluid towards pathway at side and to posterior inter axillary pathway  spending extra time in any areas of fibrosis at lateral breast then retracing all steps in supine. Seroma felt softer today and breast lymphedema was visibly reduced. STM to area of seroma at lateral breast and to lateral trunk  01/09/24 Manual Therapy: MLD: In supine: Short neck, 5 diaphragmatic breaths, R axillary nodes and establishment of interaxillary pathway, L inguinal nodes and establishment of axilloinguinal pathway, then L breast moving fluid towards pathways then to R sidelying working on L lateral trunk moving fluid towards pathway at side and to posterior inter axillary pathway spending  extra time in any areas of fibrosis at lateral breast then retracing all steps in supine. Palpated seroma today and it felt more firm and larger. .  STM to area of seroma at lateral breast and to lateral trunk with decreased pain noted afterwards  01/07/24 Manual Therapy: MLD: In supine: Short neck, 5 diaphragmatic breaths, R axillary nodes and establishment of interaxillary pathway, L inguinal nodes and establishment of axilloinguinal pathway, then L breast moving fluid towards pathways then to R sidelying working on L lateral trunk moving fluid towards pathway at side and to posterior inter axillary pathway spending extra time in any areas of fibrosis at lateral breast then retracing all steps in supine. Palpated seroma today and it felt more firm and larger. Palpated in supine and sitting.  STM to area of seroma at lateral breast and to lateral trunk with decreased pain noted afterwards  01/01/24 Manual Therapy: MLD: In supine: Short neck, 5 diaphragmatic breaths, R axillary nodes and establishment of interaxillary pathway, L inguinal nodes and establishment of axilloinguinal pathway, then L breast moving fluid towards pathways then to R sidelying working on L lateral trunk moving fluid towards pathway at side and to posterior inter axillary pathway spending extra time in any areas of fibrosis at lateral breast then retracing all steps in supine. Area of fibrosis at lateral breast/trunk is softer and more pliable today. STM to area of seroma at lateral breast and area of fibrosis at lateral inferior breast which is also softer today  12/30/23 Manual Therapy: MLD: In supine: Short neck, 5 diaphragmatic breaths, R axillary nodes and establishment of interaxillary pathway, L inguinal nodes and establishment of axilloinguinal pathway, then L breast moving fluid towards pathways then to R sidelying working on L lateral trunk moving fluid towards pathway at side and to posterior inter axillary pathway  spending extra time in any areas of fibrosis at lateral breast then retracing all steps in supine.  STM to area of seroma at lateral breast and area of fibrosis at lateral inferior breast  12/26/23 Manual Therapy: MLD: In supine: Short neck, 5 diaphragmatic breaths, R axillary nodes and establishment of interaxillary pathway, L inguinal nodes and establishment of axilloinguinal pathway, then L breast moving fluid towards pathways then to R sidelying working on L lateral trunk moving fluid towards pathway at side and to posterior inter axillary pathway spending extra time in any areas of fibrosis at lateral breast then retracing all steps in supine.  STM to area of seroma at lateral breast and area of fibrosis at lateral inferior breast   12/24/23 Manual Therapy: MLD: In supine: Short neck, 5 diaphragmatic breaths, R axillary nodes and establishment of interaxillary pathway, L inguinal nodes and establishment of axilloinguinal pathway, then L breast moving fluid towards pathways then to R sidelying working on L lateral trunk moving fluid towards pathway at side and to posterior inter axillary pathway spending extra time in any areas of fibrosis  at lateral breast then retracing all steps in supine.  STM to area of seroma at lateral breast - area felt softer today  12/18/23: Manual Therapy: Discussed taking the swell spot out of the bra if she has any discomfort or to readjust throughout the day MLD: In supine: Short neck, 5 diaphragmatic breaths, R axillary nodes and establishment of interaxillary pathway, L inguinal nodes and establishment of axilloinguinal pathway, then L breast moving fluid towards pathways spending extra time in any areas of fibrosis at lateral breast then retracing all steps. Spent increased time at medial and lateral breast where she had increased discomfort.   12/16/23: Manual Therapy: Discussed how to wear the swell spot in the bra and how it is ok to sleep in it STM to  area of seroma and fibrosis just lateral to areola with pitting edema noted in this area MLD: In supine: Short neck, 5 diaphragmatic breaths, R axillary nodes and establishment of interaxillary pathway, L inguinal nodes and establishment of axilloinguinal pathway, then L breast moving fluid towards pathways spending extra time in any areas of fibrosis at lateral breast then retracing all steps   12/11/23: Manual Therapy: Discussed how a seroma is different than lymphedema and the importnace of increasing compression to help the seroma heal Created foam chip pack and placed in thick stockinette for pt to wear in her bra for additional compression STM to area of seroma and fibrosis just lateral to areola with pitting edema noted in this area MLD: In supine: Short neck, 5 diaphragmatic breaths, R axillary nodes and establishment of interaxillary pathway, L inguinal nodes and establishment of axilloinguinal pathway, then L breast moving fluid towards pathways spending extra time in any areas of fibrosis at lateral breast then retracing all steps  Issued info for pt to obtain a swell spot for her breast to help decrease lymphedema and heal seroma  11/26/23: Manual Therapy: In supine: Short neck, 5 diaphragmatic breaths, R axillary nodes and establishment of interaxillary pathway, L inguinal nodes and establishment of axilloinguinal pathway, then L breast moving fluid towards pathways spending extra time in any areas of fibrosis at lateral breast then retracing all steps  STM In R sidelying to L serratus and lateral breast in area of tightness/fibrosis with pt reporting increased tenderness today then back to supine to complete MLD  11/21/23: Manual Therapy: In supine: Short neck, 5 diaphragmatic breaths, R axillary nodes and establishment of interaxillary pathway, L inguinal nodes and establishment of axilloinguinal pathway, then L breast moving fluid towards pathways spending extra time in any areas of  fibrosis then retracing all steps then L UE working proximal to distal with increased time spent at axilla and upper arm then retracing all steps.  MFR to L axilla STM using cocoa butter and WAVE tool In R sidelying to L serratus and lateral breast in area of tightness/fibrosis with good improvement noted by end of session  11/19/23: Manual Therapy: In supine: Short neck, 5 diaphragmatic breaths, R axillary nodes and establishment of interaxillary pathway, L inguinal nodes and establishment of axilloinguinal pathway, then L breast moving fluid towards pathways spending extra time in any areas of fibrosis then retracing all steps then L UE working proximal to distal with increased time spent at axilla and upper arm then retracing all steps.  STM using cocoa butter In R sidelying to L serratus and lateral breast in area of tightness/fibrosis with good improvement noted by end of session  11/14/23: Manual Therapy: In supine: Short neck, 5  diaphragmatic breaths, R axillary nodes and establishment of interaxillary pathway, L inguinal nodes and establishment of axilloinguinal pathway, then L breast moving fluid towards pathways spending extra time in any areas of fibrosis then retracing all steps then L UE working proximal to distal with increased time spent at axilla and upper arm then retracing all steps.  STM using cocoa butter In R sidelying to L serratus and lateral breast in area of tightness/fibrosis with good improvement noted by end of session     The patient was assessed using the L-Dex machine today to produce a lymphedema index baseline score. The patient will be reassessed on a regular basis (typically every 3 months) to obtain new L-Dex scores. If the score is > 6.5 points away from his/her baseline score indicating onset of subclinical lymphedema, it will be recommended to wear a compression garment for 4 weeks, 12 hours per day and then be reassessed. If the score continues to be > 6.5 points  from baseline at reassessment, we will initiate lymphedema treatment. Assessing in this manner has a 95% rate of preventing clinically significant lymphedema.   11/12/23: Manual Therapy: In supine: Short neck, 5 diaphragmatic breaths, R axillary nodes and establishment of interaxillary pathway, L inguinal nodes and establishment of axilloinguinal pathway, then L breast moving fluid towards pathways spending extra time in any areas of fibrosis then retracing all steps then L UE working proximal to distal with increased time spent at axilla and upper arm then retracing all steps.  MFR to area of cording with UE in to abduction with cording more difficult to palpate but definite areas of tightness In R sidelying to L serratus and lateral breast in area of tightness/fibrosis    PATIENT EDUCATION:  Wear compression bra 12 hrs/day for 4 weeks during day time hours Access Code: [email or web address] URL: [email or web address] Date: 08/06/2023 Prepared by: Grayce Sheldon  Exercises - Single Arm Doorway Pec Stretch at 90 Degrees Abduction  - 2 x daily - 7 x weekly - 1 sets - 3 reps - 20-30 hold - Supine Lower Trunk Rotation  - 1 x daily - 7 x weekly - 1 sets - 3 reps - 20-30 hold Education details: nerve desensitization, supine dowel exercises, lymphedema risk reduction and importance of skin care Person educated: Patient Education method: Explanation, Demonstration, Tactile cues, and Handouts Education comprehension: verbalized understanding and returned demonstration  HOME EXERCISE PROGRAM: Reviewed previously given post op HEP. Supine dowel exercises in to abduction and flexion  ASSESSMENT:  CLINICAL IMPRESSION: I have been wearing my compression sleeve. She has been able to wear the swell spot without issues, just re adjusting occasionally. The pain has been improved. The seroma is softer but there is some in increase in fibrosis at inferior breast.   Pt will benefit from skilled  therapeutic intervention to improve on the following deficits: Decreased knowledge of precautions, impaired UE functional use, pain, decreased ROM, postural dysfunction, increased edema.   PT treatment/interventions: ADL/Self care home management, [phone]- PT Re-evaluation, 97110-Therapeutic exercises, 97530- Therapeutic activity, [id number]- Neuromuscular re-education, 97535- Self Care, 02859- Manual therapy, [id number]- Orthotic Initial, and [id number]- Orthotic/Prosthetic subsequent   GOALS: Goals reviewed with patient? Yes  LONG TERM GOALS:  (STG=LTG)  GOALS Name Target Date  Goal status  1 Pt will demonstrate she has regained full shoulder ROM and function post operatively compared to baselines.  Baseline: 08/22/23 MET  2 Pt will report no pain at end range of L shoulder abduction to allow improved function. 08/22/23 MET  3 Pt will be independent in a home exercise program for continued stretching and strengthening.  08/22/23 MET  4 Pt will be independent in self MLD for long term management of L breast lymphedema and swelling in L axilla to decrease risk of infection. 10/29/23 MET 10/29/23  5 Pt will report a 50% improvement in pain in L breast to allow improved comfort.  11/26/23 01/09/24- varies still, some days are better than others 11/26/23- ONGOING Varies - some days the pain is much better but then worsens   6 Pt will return to the green on the SOZO demonstrating reversal of subclinical lymphedema.  11/26/23 MET 11/14/23 returned to green  7 Pt will demonstrate a 75% improvement in pain and discomfort of L axilla from cording to allow improved comfort and function 11/26/23 MET 11/26/23  8 Pt will obtain a swell spot for additional compression in her compression bra. 01/08/24 MET 01/09/24     PLAN:  PT FREQUENCY/DURATION: 1x/wk for 4 wks  PLAN FOR NEXT SESSION: Cont MLD to L breast and STM to seroma  [facility]  [address], [address]  [place] [address]  [phone]  Self manual lymph drainage: Do circles behind the collar bones x 10  Perform this sequence once a day.  Only give enough pressure no your skin to make the skin move.  Diaphragmatic - Supine   Inhale through nose making navel move out toward hands. Exhale through puckered lips, hands follow navel in. Repeat _5__ times. Rest _10__ seconds between repeats.   Copyright  VHI. All rights reserved.  Hug yourself.  Do circles at your neck just above your collarbones.  Repeat this 10 times.  Axilla - One at a Time   Using full weight of flat hand and fingers at center of uninvolved (R) armpit, make _10__ in-place circles.   Copyright  VHI. All rights reserved.  LEG: Inguinal Nodes Stimulation   With small finger side of hand against hip crease on involved (L) side, gently perform circles at the crease. Repeat __10_ times.   Copyright  VHI. All rights reserved.  Axilla to Inguinal Nodes - Sweep   On involved side, stretch skin from armpit along side of trunk to hip crease.  Now gently stretch skin from the involved side to the uninvolved side across the chest at the shoulder line.    Move fluid from L armpit in area that feels swollen towards these 2 pathways by gently stretching skin.   Finish by doing circles in R armpit and L groin.   Finish by doing the pathways as described above going from your involved armpit to the same side groin and going across your upper chest from the involved shoulder to the uninvolved shoulder.  Repeat the steps above where you do circles in your left groin and right armpit. Copyright  VHI. All rights reserved.    Florina [place], PT 01/20/2024, 4:56 PM

## 2024-01-27 ENCOUNTER — Inpatient Hospital Stay: Attending: Hematology and Oncology

## 2024-01-27 ENCOUNTER — Inpatient Hospital Stay (HOSPITAL_BASED_OUTPATIENT_CLINIC_OR_DEPARTMENT_OTHER): Admitting: Adult Health

## 2024-01-27 ENCOUNTER — Encounter: Payer: Self-pay | Admitting: Adult Health

## 2024-01-27 ENCOUNTER — Other Ambulatory Visit: Payer: Self-pay

## 2024-01-27 VITALS — BP 132/82 | HR 82 | Temp 97.6°F | Resp 17 | Wt 239.4 lb

## 2024-01-27 DIAGNOSIS — Z83719 Family history of colon polyps, unspecified: Secondary | ICD-10-CM | POA: Insufficient documentation

## 2024-01-27 DIAGNOSIS — I890 Lymphedema, not elsewhere classified: Secondary | ICD-10-CM | POA: Insufficient documentation

## 2024-01-27 DIAGNOSIS — C50412 Malignant neoplasm of upper-outer quadrant of left female breast: Secondary | ICD-10-CM

## 2024-01-27 DIAGNOSIS — Z1732 Human epidermal growth factor receptor 2 negative status: Secondary | ICD-10-CM | POA: Insufficient documentation

## 2024-01-27 DIAGNOSIS — Z170 Estrogen receptor positive status [ER+]: Secondary | ICD-10-CM | POA: Insufficient documentation

## 2024-01-27 DIAGNOSIS — Z79811 Long term (current) use of aromatase inhibitors: Secondary | ICD-10-CM | POA: Insufficient documentation

## 2024-01-27 DIAGNOSIS — Z923 Personal history of irradiation: Secondary | ICD-10-CM | POA: Insufficient documentation

## 2024-01-27 DIAGNOSIS — Z1721 Progesterone receptor positive status: Secondary | ICD-10-CM | POA: Insufficient documentation

## 2024-01-27 DIAGNOSIS — Z79899 Other long term (current) drug therapy: Secondary | ICD-10-CM | POA: Insufficient documentation

## 2024-01-27 LAB — CBC WITH DIFFERENTIAL (CANCER CENTER ONLY)
Abs Immature Granulocytes: 0.01 K/uL (ref 0.00–0.07)
Basophils Absolute: 0 K/uL (ref 0.0–0.1)
Basophils Relative: 0 %
Eosinophils Absolute: 0.1 K/uL (ref 0.0–0.5)
Eosinophils Relative: 2 %
HCT: 39.7 % (ref 36.0–46.0)
Hemoglobin: 13.3 g/dL (ref 12.0–15.0)
Immature Granulocytes: 0 %
Lymphocytes Relative: 39 %
Lymphs Abs: 1.8 K/uL (ref 0.7–4.0)
MCH: 27.6 pg (ref 26.0–34.0)
MCHC: 33.5 g/dL (ref 30.0–36.0)
MCV: 82.4 fL (ref 80.0–100.0)
Monocytes Absolute: 0.4 K/uL (ref 0.1–1.0)
Monocytes Relative: 9 %
Neutro Abs: 2.3 K/uL (ref 1.7–7.7)
Neutrophils Relative %: 50 %
Platelet Count: 210 K/uL (ref 150–400)
RBC: 4.82 MIL/uL (ref 3.87–5.11)
RDW: 14.6 % (ref 11.5–15.5)
WBC Count: 4.5 K/uL (ref 4.0–10.5)
nRBC: 0 % (ref 0.0–0.2)

## 2024-01-27 LAB — CMP (CANCER CENTER ONLY)
ALT: 25 U/L (ref 0–44)
AST: 20 U/L (ref 15–41)
Albumin: 4.7 g/dL (ref 3.5–5.0)
Alkaline Phosphatase: 73 U/L (ref 38–126)
Anion gap: 7 (ref 5–15)
BUN: 16 mg/dL (ref 6–20)
CO2: 29 mmol/L (ref 22–32)
Calcium: 9.8 mg/dL (ref 8.9–10.3)
Chloride: 103 mmol/L (ref 98–111)
Creatinine: 0.86 mg/dL (ref 0.44–1.00)
GFR, Estimated: >60 mL/min (ref >60)
Glucose, Bld: 109 mg/dL — ABNORMAL HIGH (ref 70–99)
Potassium: 4.3 mmol/L (ref 3.5–5.1)
Sodium: 139 mmol/L (ref 135–145)
Total Bilirubin: 0.5 mg/dL (ref 0.0–1.2)
Total Protein: 7.7 g/dL (ref 6.5–8.1)

## 2024-01-27 NOTE — Progress Notes (Signed)
 SURVIVORSHIP VISIT:  BRIEF ONCOLOGIC HISTORY:  Oncology History  Malignant neoplasm of upper-outer quadrant of left breast in female, estrogen receptor positive (HCC)  04/25/2023 Mammogram   Mass in the left breast central to nipple anterior depth. Additional imaging showed 1 * 0.8*0.7 cm irregular enlarging mass in the left breast at 3 0 clock anterior depth suspicious of malignancy. US  guided biopsy is recommended.   06/04/2023 Pathology Results   Left breast needle core biopsy showed grade 3 IDC, ER 100% positive, PR 70% strong staining,  Her 2 0, Ki 67 30%   06/11/2023 Initial Diagnosis   Malignant neoplasm of upper-outer quadrant of left breast in female, estrogen receptor positive (HCC)   06/21/2023 Genetic Testing   Negative genetic testing on the CancerNext-Expanded+RNAinsight panel.  The report date is June 21, 2023.  The CancerNext-Expanded gene panel offered by [facility] and includes sequencing, rearrangement, and RNA analysis for the following 76 genes: AIP, ALK, APC, ATM, AXIN2, BAP1, BARD1, BMPR1A, BRCA1, BRCA2, BRIP1, CDC73, CDH1, CDK4, CDKN1B, CDKN2A, CEBPA, CHEK2, CTNNA1, DDX41, DICER1, ETV6, FH, FLCN, GATA2, LZTR1, MAX, MBD4, MEN1, MET, MLH1, MSH2, MSH3, MSH6, MUTYH, NF1, NF2, NTHL1, PALB2, PHOX2B, PMS2, POT1, PRKAR1A, PTCH1, PTEN, RAD51C, RAD51D, RB1, RET, RUNX1, SDHA, SDHAF2, SDHB, SDHC, SDHD, SMAD4, SMARCA4, SMARCB1, SMARCE1, STK11, SUFU, TMEM127, TP53, TSC1, TSC2, VHL, and WT1 (sequencing and deletion/duplication); EGFR, HOXB13, KIT, MITF, PDGFRA, POLD1, and POLE (sequencing only); EPCAM and GREM1 (deletion/duplication only).     08/08/2023 - 09/06/2023 Radiation Therapy   Plan Name: Breast_L_BH Site: Breast, Left Technique: 3D Mode: Photon Dose Per Fraction: 2.66 Gy Prescribed Dose (Delivered / Prescribed): 42.56 Gy / 42.56 Gy Prescribed Fxs (Delivered / Prescribed): 16 / 16   Plan Name: Brst_L_Bst_BH Site: Breast, Left Technique: 3D Mode: Photon Dose Per  Fraction: 2 Gy Prescribed Dose (Delivered / Prescribed): 8 Gy / 8 Gy Prescribed Fxs (Delivered / Prescribed): 4 / 4   10/2023 -  Anti-estrogen oral therapy   Anastrozole      INTERVAL HISTORY:  Discussed the use of AI scribe software for clinical note transcription with the patient, who gave verbal consent to proceed.  History of Present Illness Norma [name] is a 53 year old female with stage 1A estrogen-positive breast cancer who presents for a follow-up on her cancer treatment and side effects.  She underwent a lumpectomy and lymph node removal, with both nodes negative for cancer. She completed radiation therapy and is on anastrozole  for anti-estrogen therapy.  She experiences a seroma at the lumpectomy site, requiring aspiration six to seven times. Despite physical therapy and daily use of a FlexiTouch device, she feels frustrated with the persistent seroma and swelling in her left breast. Breast pain, described as a 'twinge', began six weeks post-surgery.   She wears a sleeve for mild lymphedema in her arm, detected at a recent test.   She switched anastrozole  intake from night to morning due to sleep disturbances, which resolved. She experiences minimal hot flashes, potentially linked to dietary triggers. Initial hip and joint pain with anastrozole  has subsided. A bone density test is scheduled for April and a mammogram in February.    REVIEW OF SYSTEMS:  Review of Systems  Constitutional:  Negative for appetite change, chills, fatigue, fever and unexpected weight change.  HENT:   Negative for hearing loss, lump/mass and trouble swallowing.   Eyes:  Negative for eye problems and icterus.  Respiratory:  Negative for chest tightness, cough and shortness of breath.   Cardiovascular:  Negative for  chest pain, leg swelling and palpitations.  Gastrointestinal:  Negative for abdominal distention, abdominal pain, constipation, diarrhea, nausea and vomiting.  Endocrine: Negative for  hot flashes.  Genitourinary:  Negative for difficulty urinating.   Musculoskeletal:  Negative for arthralgias.  Skin:  Negative for itching and rash.  Neurological:  Negative for dizziness, extremity weakness, headaches and numbness.  Hematological:  Negative for adenopathy. Does not bruise/bleed easily.  Psychiatric/Behavioral:  Negative for depression. The patient is not nervous/anxious.         PAST MEDICAL/SURGICAL HISTORY:  Past Medical History:  Diagnosis Date   Allergy    Anemia    hx of   Anxiety    Arthritis    KNEES   Asthma    treated for during COVID tx   Cancer (HCC) 06/2023   left breast IDC   Chronic headache    Colon polyps    TUBULAR ADENOMAS AND HYPERPLASTIC    Complication of anesthesia    DDD (degenerative disc disease), thoracic    Depression    Diabetes mellitus, type 2 (HCC)    Difficult airway for intubation    Fibromyalgia    GERD (gastroesophageal reflux disease)    Goiter    Hyperlipidemia    Hypertension    Low back pain    Obesity    PONV (postoperative nausea and vomiting)    RA (rheumatoid arthritis) (HCC)    Seasonal allergies    Vitamin D  deficiency    Past Surgical History:  Procedure Laterality Date   ABDOMINAL HYSTERECTOMY  2008   BACK SURGERY  2005   lumbar laminectomy   BALLOON DILATION N/A 06/01/2021   Procedure: BALLOON DILATION;  Surgeon: Albertus Gordy HERO, MD;  Location: [facility];  Service: Gastroenterology;  Laterality: N/A;   BARTHOLIN GLAND CYST EXCISION     BIOPSY  06/01/2021   Procedure: BIOPSY;  Surgeon: Albertus Gordy HERO, MD;  Location: [facility];  Service: Gastroenterology;;   BREAST LUMPECTOMY WITH RADIOACTIVE SEED AND SENTINEL LYMPH NODE BIOPSY Left 07/02/2023   Procedure: BREAST LUMPECTOMY WITH RADIOACTIVE SEED AND SENTINEL LYMPH NODE BIOPSY;  Surgeon: Aron Shoulders, MD;  Location: [facility];  Service: General;  Laterality: Left;  90 MIN 78444- EXCISION LEFT CHEST WALL MASS GEN COMBINED WITH  REGIONAL   COLONOSCOPY  2017   JMP-MAC-prep good-TA -recall 5 yr   COLONOSCOPY WITH PROPOFOL  N/A 06/01/2021   Procedure: COLONOSCOPY WITH PROPOFOL ;  Surgeon: Albertus Gordy HERO, MD;  Location: [facility];  Service: Gastroenterology;  Laterality: N/A;   ESOPHAGOGASTRODUODENOSCOPY (EGD) WITH PROPOFOL  N/A 06/01/2021   Procedure: ESOPHAGOGASTRODUODENOSCOPY (EGD) WITH PROPOFOL ;  Surgeon: Albertus Gordy HERO, MD;  Location: [facility];  Service: Gastroenterology;  Laterality: N/A;   LUMBAR LAMINECTOMY/DECOMPRESSION MICRODISCECTOMY Right 07/17/2013   Procedure: LUMBAR LAMINECTOMY/DECOMPRESSION MICRODISCECTOMY 1 LEVEL,RIGHT LUMBAR FOUR-FIVE;  Surgeon: Darina MALVA Boehringer, MD;  Location: [facility];  Service: Neurosurgery;  Laterality: Right;  Right   MASS EXCISION Left 07/02/2023   Procedure: EXCISION, MASS, CHEST WALL;  Surgeon: Aron Shoulders, MD;  Location: [facility];  Service: General;  Laterality: Left;   PARTIAL HYSTERECTOMY     POLYPECTOMY  2017   TA and benign polypoid   POLYPECTOMY  06/01/2021   Procedure: POLYPECTOMY;  Surgeon: Albertus Gordy HERO, MD;  Location: [facility];  Service: Gastroenterology;;   WISDOM TOOTH EXTRACTION       ALLERGIES:  Allergies  Allergen Reactions   Empagliflozin Other (See Comments)    Flank pain  Metformin Nausea Only   Semaglutide Other (See Comments)    Headache   Tizanidine Other (See Comments)    Causes nightmares     CURRENT MEDICATIONS:  Outpatient Encounter Medications as of 01/27/2024  Medication Sig Note   anastrozole  (ARIMIDEX ) 1 MG tablet Take 1 tablet (1 mg total) by mouth daily.    aspirin 81 MG chewable tablet Chew by mouth daily.    ELDERBERRY PO Take 1 tablet by mouth daily. As needed    pantoprazole  (PROTONIX ) 20 MG tablet TAKE 1 TABLET BY MOUTH TWICE A DAY    polyethylene glycol powder (GLYCOLAX /MIRALAX ) powder Dissolve 17 grams in at least 8 ounces water /juice and drink once daily. 01/04/2022: As needed    pregabalin  (LYRICA) 25 MG capsule Take 25 mg by mouth 2 (two) times daily. 12/16/2023: PRN   Probiotic Product (PROBIOTIC DAILY PO) Take 1 tablet by mouth daily. [facility]    telmisartan  (MICARDIS ) 80 MG tablet Take 1 tablet (80 mg total) by mouth daily.    valACYclovir (VALTREX) 500 MG tablet Take 1 tablet by mouth daily as needed (Flair up). As needed    [DISCONTINUED] Azelastine -Fluticasone  137-50 MCG/ACT SUSP Place 1 spray into the nose every 12 (twelve) hours. 12/16/2023: PRN   [DISCONTINUED] Continuous Glucose Receiver (DEXCOM G7 RECEIVER) DEVI Check blood glucose levels continuously E11.65 Z79.84    [DISCONTINUED] Continuous Glucose Sensor (DEXCOM G7 SENSOR) MISC Check blood glucose levels continuously E11.65 Z79.84    [DISCONTINUED] ezetimibe  (ZETIA ) 10 MG tablet Take 1 tablet (10 mg total) by mouth daily.    [DISCONTINUED] glimepiride  (AMARYL ) 1 MG tablet Take 1 tablet (1 mg total) by mouth daily with breakfast. (Patient not taking: Reported on 12/27/2023)    No facility-administered encounter medications on file as of 01/27/2024.     ONCOLOGIC FAMILY HISTORY:  Family History  Problem Relation Age of Onset   Diabetes Mother    Aneurysm Mother    Diabetes Father    Colon polyps Maternal Uncle    Liver disease Maternal Grandmother    Rheum arthritis Maternal Grandmother    Stroke Maternal Grandfather    Parkinson's disease Paternal Grandmother    Esophageal cancer Neg Hx    Colon cancer Neg Hx    Rectal cancer Neg Hx    Stomach cancer Neg Hx      SOCIAL HISTORY:  Social History   Socioeconomic History   Marital status: Single    Spouse name: Not on file   Number of children: 1   Years of education: Not on file   Highest education level: Associate degree: academic program  Occupational History   Occupation: Tax Adviser: [employer]  Tobacco Use   Smoking status: Never   Smokeless tobacco: Never  Vaping Use   Vaping status: Never Used  Substance and Sexual Activity    Alcohol use: Yes    Comment: occ   Drug use: Never   Sexual activity: Not Currently    Birth control/protection: Surgical    Comment: hyst  Other Topics Concern   Not on file  Social History Narrative   Lives at home. Her son lives with her.    Right handed   Caffeine: few times a year   Social Drivers of Health   Financial Resource Strain: Low Risk  (09/06/2023)   Overall Financial Resource Strain (CARDIA)    Difficulty of Paying Living Expenses: Not hard at all  Food Insecurity: No Food Insecurity (11/01/2023)   Hunger Vital  Sign    Worried About Programme Researcher, Broadcasting/film/video in the Last Year: Never true    Ran Out of Food in the Last Year: Never true  Transportation Needs: No Transportation Needs (09/06/2023)   PRAPARE - Administrator, Civil Service (Medical): No    Lack of Transportation (Non-Medical): No  Physical Activity: Insufficiently Active (09/06/2023)   Exercise Vital Sign    Days of Exercise per Week: 1 day    Minutes of Exercise per Session: 10 min  Stress: No Stress Concern Present (09/06/2023)   [employer] of Occupational Health - Occupational Stress Questionnaire    Feeling of Stress: Only a little  Social Connections: Moderately Isolated (09/06/2023)   Social Connection and Isolation Panel    Frequency of Communication with Friends and Family: More than three times a week    Frequency of Social Gatherings with Friends and Family: Once a week    Attends Religious Services: 1 to 4 times per year    Active Member of [employer] or Organizations: No    Attends Engineer, Structural: Not on file    Marital Status: Never married  Intimate Partner Violence: Not At Risk (07/31/2023)   Humiliation, Afraid, Rape, and Kick questionnaire    Fear of Current or Ex-Partner: No    Emotionally Abused: No    Physically Abused: No    Sexually Abused: No     OBSERVATIONS/OBJECTIVE:  BP 132/82 (BP Location: Right Arm, Patient Position: Sitting)   Pulse 82    Temp 97.6 F (36.4 C) (Temporal)   Resp 17   Wt 239 lb 6.4 oz (108.6 kg)   SpO2 100%   BMI 38.64 kg/m  GENERAL: Patient is a well appearing female in no acute distress HEENT:  Sclerae anicteric.  Oropharynx clear and moist. No ulcerations or evidence of oropharyngeal candidiasis. Neck is supple.  NODES:  No cervical, supraclavicular, or axillary lymphadenopathy palpated.  BREAST EXAM:  right breast is benign, left breast s/p lumpectomy and radiation, no signs of breast cancer recurrence, however breast lymphedema is present, no erythema, warmth, or sign of infection noted LUNGS:  Clear to auscultation bilaterally.  No wheezes or rhonchi. HEART:  Regular rate and rhythm. No murmur appreciated. ABDOMEN:  Soft, nontender.  Positive, normoactive bowel sounds. No organomegaly palpated. MSK:  No focal spinal tenderness to palpation. Full range of motion bilaterally in the upper extremities. EXTREMITIES:  No peripheral edema.   SKIN:  Clear with no obvious rashes or skin changes. No nail dyscrasia. NEURO:  Nonfocal. Well oriented.  Appropriate affect.   LABORATORY DATA:  None for this visit.  DIAGNOSTIC IMAGING:  None for this visit.      ASSESSMENT AND PLAN:  Ms.. Witts is a pleasant 53 y.o. female with Stage IA left breast invasive ductal carcinoma, ER+/PR+/HER2-, diagnosed in 06/2023, treated with lumpectomy, adjuvant radiation therapy, and anti-estrogen therapy with Anastrozole  beginning in 10/2023.  She presents to the Survivorship Clinic for our initial meeting and routine follow-up post-completion of treatment for breast cancer.   1. Stage IA left breast cancer:  Ms. Nath is continuing to recover from definitive treatment for breast cancer. She will follow-up with her medical oncologist, Dr.  Loretha in 08/2024 with history and physical exam per surveillance protocol.  She will f/u with Dr. Aron in 05/2024.  She will continue her anti-estrogen therapy with Anastrozole . Thus far, she is  tolerating the Anastrozole  well, with minimal side effects. Her mammogram is due 04/2024; orders  placed today.  She is experiencing breast lymphedema and recurrent seromas.  She is going to PT.  I recommended she try OTC voltaren  gel for her breast discomfort.   Today, a comprehensive survivorship care plan and treatment summary was reviewed with the patient today detailing her breast cancer diagnosis, treatment course, potential late/long-term effects of treatment, appropriate follow-up care with recommendations for the future, and patient education resources.  A copy of this summary, along with a letter will be sent to the patients primary care provider via mail/fax/In Basket message after todays visit.    2. Bone health:  Given Ms. Blahut's age/history of breast cancer and her current treatment regimen including anti-estrogen therapy with Anastrozole , she is at risk for bone demineralization.  Bone density testing is scheduled in 06/2024. She was given education on specific activities to promote bone health.  3. Cancer screening:  Due to Ms. Lacour's history and her age, she should receive screening for skin cancers, colon cancer, and gynecologic cancers.  The information and recommendations are listed on the patient's comprehensive care plan/treatment summary and were reviewed in detail with the patient.    4. Health maintenance and wellness promotion: Ms. Humphries was encouraged to consume 5-7 servings of fruits and vegetables per day. We reviewed the Nutrition Rainbow handout.  She was also encouraged to engage in moderate to vigorous exercise for 30 minutes per day most days of the week.  She was instructed to limit her alcohol consumption and continue to abstain from tobacco use.     5. Support services/counseling: It is not uncommon for this period of the patient's cancer care trajectory to be one of many emotions and stressors.   She was given information regarding our available services and  encouraged to contact me with any questions or for help enrolling in any of our support group/programs.   Follow up instructions:    -Return to cancer center for f/u with Dr. Loretha in 06/2024  -Mammogram due in 04/2024 -Follow up with Dr. Aron 05/2024 -DEXA 06/2024 -She is welcome to return back to the Survivorship Clinic at any time; no additional follow-up needed at this time.  -Consider referral back to survivorship as a long-term survivor for continued surveillance  The patient was provided an opportunity to ask questions and all were answered. The patient agreed with the plan and demonstrated an understanding of the instructions.   Total encounter time:45 minutes*in face-to-face visit time, chart review, lab review, care coordination, order entry, and documentation of the encounter time.    Morna Kendall, NP 01/27/24 1:34 PM Medical Oncology and Hematology [facility] [address] [place], [place] [address] Tel. [phone]    Fax. [phone]  *Total Encounter Time as defined by the Centers for Medicare and Medicaid Services includes, in addition to the face-to-face time of a patient visit (documented in the note above) non-face-to-face time: obtaining and reviewing outside history, ordering and reviewing medications, tests or procedures, care coordination (communications with other health care professionals or caregivers) and documentation in the medical record.

## 2024-01-28 ENCOUNTER — Ambulatory Visit: Payer: Self-pay | Admitting: Physical Therapy

## 2024-01-28 ENCOUNTER — Encounter: Payer: Self-pay | Admitting: Physical Therapy

## 2024-01-28 DIAGNOSIS — C50412 Malignant neoplasm of upper-outer quadrant of left female breast: Secondary | ICD-10-CM

## 2024-01-28 DIAGNOSIS — M25612 Stiffness of left shoulder, not elsewhere classified: Secondary | ICD-10-CM

## 2024-01-28 DIAGNOSIS — R293 Abnormal posture: Secondary | ICD-10-CM

## 2024-01-28 DIAGNOSIS — I890 Lymphedema, not elsewhere classified: Secondary | ICD-10-CM | POA: Diagnosis not present

## 2024-01-28 DIAGNOSIS — Z483 Aftercare following surgery for neoplasm: Secondary | ICD-10-CM

## 2024-01-28 NOTE — Therapy (Signed)
 OUTPATIENT PHYSICAL THERAPY BREAST CANCER TREATMENT   Patient Name: Norma [name] MRN: [id number] DOB:[birth year], 53 y.o., female Today's Date: 01/28/2024  END OF SESSION:  PT End of Session - 01/28/24 1254     Visit Number 35    Number of Visits 36    Date for Recertification  02/06/24    PT Start Time 1204    PT Stop Time 1254    PT Time Calculation (min) 50 min    Activity Tolerance Patient tolerated treatment well    Behavior During Therapy [facility] for tasks assessed/performed              Past Medical History:  Diagnosis Date   Allergy    Anemia    hx of   Anxiety    Arthritis    KNEES   Asthma    treated for during COVID tx   Cancer (HCC) 06/2023   left breast IDC   Chronic headache    Colon polyps    TUBULAR ADENOMAS AND HYPERPLASTIC    Complication of anesthesia    DDD (degenerative disc disease), thoracic    Depression    Diabetes mellitus, type 2 (HCC)    Difficult airway for intubation    Fibromyalgia    GERD (gastroesophageal reflux disease)    Goiter    Hyperlipidemia    Hypertension    Low back pain    Obesity    PONV (postoperative nausea and vomiting)    RA (rheumatoid arthritis) (HCC)    Seasonal allergies    Vitamin D  deficiency    Past Surgical History:  Procedure Laterality Date   ABDOMINAL HYSTERECTOMY  2008   BACK SURGERY  2005   lumbar laminectomy   BALLOON DILATION N/A 06/01/2021   Procedure: BALLOON DILATION;  Surgeon: Albertus Gordy HERO, MD;  Location: [facility];  Service: Gastroenterology;  Laterality: N/A;   BARTHOLIN GLAND CYST EXCISION     BIOPSY  06/01/2021   Procedure: BIOPSY;  Surgeon: Albertus Gordy HERO, MD;  Location: [facility];  Service: Gastroenterology;;   BREAST LUMPECTOMY WITH RADIOACTIVE SEED AND SENTINEL LYMPH NODE BIOPSY Left 07/02/2023   Procedure: BREAST LUMPECTOMY WITH RADIOACTIVE SEED AND SENTINEL LYMPH NODE BIOPSY;  Surgeon: Aron Shoulders, MD;  Location: [facility];  Service: General;   Laterality: Left;  90 MIN 78444- EXCISION LEFT CHEST WALL MASS GEN COMBINED WITH REGIONAL   COLONOSCOPY  2017   JMP-MAC-prep good-TA -recall 5 yr   COLONOSCOPY WITH PROPOFOL  N/A 06/01/2021   Procedure: COLONOSCOPY WITH PROPOFOL ;  Surgeon: Albertus Gordy HERO, MD;  Location: [facility];  Service: Gastroenterology;  Laterality: N/A;   ESOPHAGOGASTRODUODENOSCOPY (EGD) WITH PROPOFOL  N/A 06/01/2021   Procedure: ESOPHAGOGASTRODUODENOSCOPY (EGD) WITH PROPOFOL ;  Surgeon: Albertus Gordy HERO, MD;  Location: [facility];  Service: Gastroenterology;  Laterality: N/A;   LUMBAR LAMINECTOMY/DECOMPRESSION MICRODISCECTOMY Right 07/17/2013   Procedure: LUMBAR LAMINECTOMY/DECOMPRESSION MICRODISCECTOMY 1 LEVEL,RIGHT LUMBAR FOUR-FIVE;  Surgeon: Darina MALVA Boehringer, MD;  Location: [facility];  Service: Neurosurgery;  Laterality: Right;  Right   MASS EXCISION Left 07/02/2023   Procedure: EXCISION, MASS, CHEST WALL;  Surgeon: Aron Shoulders, MD;  Location:  SURGERY CENTER;  Service: General;  Laterality: Left;   PARTIAL HYSTERECTOMY     POLYPECTOMY  2017   TA and benign polypoid   POLYPECTOMY  06/01/2021   Procedure: POLYPECTOMY;  Surgeon: Albertus Gordy HERO, MD;  Location: [facility];  Service: Gastroenterology;;   WISDOM TOOTH EXTRACTION     Patient Active Problem List  Diagnosis Date Noted   Genetic testing 06/23/2023   Malignant neoplasm of upper-outer quadrant of left breast in female, estrogen receptor positive (HCC) 06/11/2023   Gastritis without bleeding    Esophageal dysphagia    Schatzki's ring    Benign neoplasm of ascending colon    Class 3 severe obesity with serious comorbidity and body mass index (BMI) of 40.0 to 44.9 in adult (HCC) 08/25/2019   Type 2 diabetes mellitus without complication, without long-term current use of insulin (HCC) 08/25/2019   Chronic cough 06/27/2017   LUQ abdominal pain 12/07/2014   RUQ abdominal pain 11/10/2013   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   Gastroesophageal  reflux disease 12/18/2012   Hx of adenomatous colonic polyps 12/18/2012   Chronic RLQ pain 12/18/2012   IBS (irritable bowel syndrome) 01/22/2011   HTN (hypertension) 01/22/2011   Hyperlipidemia 01/22/2011   Anxiety and depression 01/22/2011    PCP: Haze Servant, NP  REFERRING PROVIDER: Dr. Jina Nephew   REFERRING DIAG: Left breast cancer  THERAPY DIAG:  Lymphedema, not elsewhere classified  Stiffness of left shoulder, not elsewhere classified  Aftercare following surgery for neoplasm  Abnormal posture  Malignant neoplasm of upper-outer quadrant of left breast in female, estrogen receptor positive (HCC)  Rationale for Evaluation and Treatment: Rehabilitation  ONSET DATE: 04/26/23  SUBJECTIVE:                                                                                                                                                                                           SUBJECTIVE STATEMENT: I had a burning pain in my breast today.   PERTINENT HISTORY:  Patient was diagnosed on 04/26/2023 with left grade 3 invasive ductal carcinoma breast cancer. It measures 1 cm and is located in the upper outer quadrant. It is ER/PR positive and HER2 negative with a Ki67 of 30%. Pt underwent a L lumpectomy and SLNB 0/2 on 07/02/23  PATIENT GOALS:  Reassess how my recovery is going related to arm function, pain, and swelling.  PAIN:  Are you having pain?  4/10, burning pain, L breast, nothing really makes it worse or better  PRECAUTIONS: Recent Surgery, left UE Lymphedema risk,   RED FLAGS: None   ACTIVITY LEVEL / LEISURE: doing post op exercises   OBJECTIVE:   PATIENT SURVEYS:  QUICK DASH:     OBSERVATIONS: Healing scars with some increased scar tissue at L axilla  POSTURE:  Forward head and rounded shoulders posture   UPPER EXTREMITY AROM/PROM:   A/PROM RIGHT   eval    Shoulder extension 48  Shoulder flexion 153  Shoulder abduction 165  Shoulder internal  rotation 63  Shoulder external rotation 84                          (Blank rows = not tested)   A/PROM LEFT   eval LEFT  07/25/23 LEFT 08/08/23 LEFT 10/01/23  Shoulder extension 35 70    Shoulder flexion 150 162 171 176  Shoulder abduction 167 156 151 163  Shoulder internal rotation 63 69    Shoulder external rotation 75 80                            (Blank rows = not tested)   CERVICAL AROM: All within normal limits   UPPER EXTREMITY STRENGTH: WFL   LYMPHEDEMA ASSESSMENTS (in cm):    LANDMARK RIGHT   eval  10 cm proximal to olecranon process 38.8  Olecranon process 28.7  10 cm proximal to ulnar styloid process 25.8  Just proximal to ulnar styloid process 17  Across hand at thumb web space 20.5  At base of 2nd digit 5.9  (Blank rows = not tested)   LANDMARK LEFT   eval LEFT 07/25/23  10 cm proximal to olecranon process 40 38.4  Olecranon process 28.8 29.5  10 cm proximal to ulnar styloid process 24.3 23  Just proximal to ulnar styloid process 16.3 16.5  Across hand at thumb web space 19.5 20  At base of 2nd digit 5.9 5.8  (Blank rows = not tested)  Surgery type/Date: 07/02/23 L breast lumpectomy and SLNB Number of lymph nodes removed: 0/2 Current/past treatment (chemo, radiation, hormone therapy): does not need chemo, will require radiation, will need hormone therapy Other symptoms:  Heaviness/tightness Yes Pain Yes Pitting edema No Infections No Decreased scar mobility Yes Stemmer sign No     TREATMENT PERFORMED: 01/28/24 Manual Therapy: MLD: In supine: Short neck, 5 diaphragmatic breaths, R axillary nodes and establishment of interaxillary pathway, L inguinal nodes and establishment of axilloinguinal pathway, then L breast moving fluid towards pathways, L UE moving fluid towards pathways, then to R sidelying working on L lateral trunk moving fluid towards pathway at side and to posterior inter axillary pathway spending extra time in any areas of fibrosis at  lateral breast then retracing all steps in supine. Seroma felt more fibrotic today and there was increased fibrosis at inferior breast STM to area of seroma at lateral breast and to lateral trunk  01/20/24 Manual Therapy: MLD: In supine: Short neck, 5 diaphragmatic breaths, R axillary nodes and establishment of interaxillary pathway, L inguinal nodes and establishment of axilloinguinal pathway, then L breast moving fluid towards pathways, L UE moving fluid towards pathways, then to R sidelying working on L lateral trunk moving fluid towards pathway at side and to posterior inter axillary pathway spending extra time in any areas of fibrosis at lateral breast then retracing all steps in supine. Seroma felt softer today and breast lymphedema was slight more fibrotic STM to area of seroma at lateral breast and to lateral trunk  01/14/24 SOZO screening repeated:   The patient was assessed using the L-Dex machine today to produce a lymphedema index baseline score. The patient will be reassessed on a regular basis (typically every 3 months) to obtain new L-Dex scores. If the score is > 6.5 points away from his/her baseline score indicating onset of subclinical lymphedema, it will be recommended to wear a compression garment for 4 weeks, 12 hours per day and then  be reassessed. If the score continues to be > 6.5 points from baseline at reassessment, we will initiate lymphedema treatment. Assessing in this manner has a 95% rate of preventing clinically significant lymphedema. Educated pt to begin wearing her compression sleeve consistently every day during waking hours Manual Therapy: MLD: In supine: Short neck, 5 diaphragmatic breaths, R axillary nodes and establishment of interaxillary pathway, L inguinal nodes and establishment of axilloinguinal pathway, then L breast moving fluid towards pathways, L UE moving fluid towards pathways, then to R sidelying working on L lateral trunk moving fluid towards pathway  at side and to posterior inter axillary pathway spending extra time in any areas of fibrosis at lateral breast then retracing all steps in supine. Seroma felt softer today and breast lymphedema was visibly reduced. STM to area of seroma at lateral breast and to lateral trunk  01/09/24 Manual Therapy: MLD: In supine: Short neck, 5 diaphragmatic breaths, R axillary nodes and establishment of interaxillary pathway, L inguinal nodes and establishment of axilloinguinal pathway, then L breast moving fluid towards pathways then to R sidelying working on L lateral trunk moving fluid towards pathway at side and to posterior inter axillary pathway spending extra time in any areas of fibrosis at lateral breast then retracing all steps in supine. Palpated seroma today and it felt more firm and larger. .  STM to area of seroma at lateral breast and to lateral trunk with decreased pain noted afterwards  01/07/24 Manual Therapy: MLD: In supine: Short neck, 5 diaphragmatic breaths, R axillary nodes and establishment of interaxillary pathway, L inguinal nodes and establishment of axilloinguinal pathway, then L breast moving fluid towards pathways then to R sidelying working on L lateral trunk moving fluid towards pathway at side and to posterior inter axillary pathway spending extra time in any areas of fibrosis at lateral breast then retracing all steps in supine. Palpated seroma today and it felt more firm and larger. Palpated in supine and sitting.  STM to area of seroma at lateral breast and to lateral trunk with decreased pain noted afterwards  01/01/24 Manual Therapy: MLD: In supine: Short neck, 5 diaphragmatic breaths, R axillary nodes and establishment of interaxillary pathway, L inguinal nodes and establishment of axilloinguinal pathway, then L breast moving fluid towards pathways then to R sidelying working on L lateral trunk moving fluid towards pathway at side and to posterior inter axillary pathway  spending extra time in any areas of fibrosis at lateral breast then retracing all steps in supine. Area of fibrosis at lateral breast/trunk is softer and more pliable today. STM to area of seroma at lateral breast and area of fibrosis at lateral inferior breast which is also softer today  12/30/23 Manual Therapy: MLD: In supine: Short neck, 5 diaphragmatic breaths, R axillary nodes and establishment of interaxillary pathway, L inguinal nodes and establishment of axilloinguinal pathway, then L breast moving fluid towards pathways then to R sidelying working on L lateral trunk moving fluid towards pathway at side and to posterior inter axillary pathway spending extra time in any areas of fibrosis at lateral breast then retracing all steps in supine.  STM to area of seroma at lateral breast and area of fibrosis at lateral inferior breast  12/26/23 Manual Therapy: MLD: In supine: Short neck, 5 diaphragmatic breaths, R axillary nodes and establishment of interaxillary pathway, L inguinal nodes and establishment of axilloinguinal pathway, then L breast moving fluid towards pathways then to R sidelying working on L lateral trunk moving fluid towards pathway  at side and to posterior inter axillary pathway spending extra time in any areas of fibrosis at lateral breast then retracing all steps in supine.  STM to area of seroma at lateral breast and area of fibrosis at lateral inferior breast   12/24/23 Manual Therapy: MLD: In supine: Short neck, 5 diaphragmatic breaths, R axillary nodes and establishment of interaxillary pathway, L inguinal nodes and establishment of axilloinguinal pathway, then L breast moving fluid towards pathways then to R sidelying working on L lateral trunk moving fluid towards pathway at side and to posterior inter axillary pathway spending extra time in any areas of fibrosis at lateral breast then retracing all steps in supine.  STM to area of seroma at lateral breast - area felt  softer today  12/18/23: Manual Therapy: Discussed taking the swell spot out of the bra if she has any discomfort or to readjust throughout the day MLD: In supine: Short neck, 5 diaphragmatic breaths, R axillary nodes and establishment of interaxillary pathway, L inguinal nodes and establishment of axilloinguinal pathway, then L breast moving fluid towards pathways spending extra time in any areas of fibrosis at lateral breast then retracing all steps. Spent increased time at medial and lateral breast where she had increased discomfort.   12/16/23: Manual Therapy: Discussed how to wear the swell spot in the bra and how it is ok to sleep in it STM to area of seroma and fibrosis just lateral to areola with pitting edema noted in this area MLD: In supine: Short neck, 5 diaphragmatic breaths, R axillary nodes and establishment of interaxillary pathway, L inguinal nodes and establishment of axilloinguinal pathway, then L breast moving fluid towards pathways spending extra time in any areas of fibrosis at lateral breast then retracing all steps   12/11/23: Manual Therapy: Discussed how a seroma is different than lymphedema and the importnace of increasing compression to help the seroma heal Created foam chip pack and placed in thick stockinette for pt to wear in her bra for additional compression STM to area of seroma and fibrosis just lateral to areola with pitting edema noted in this area MLD: In supine: Short neck, 5 diaphragmatic breaths, R axillary nodes and establishment of interaxillary pathway, L inguinal nodes and establishment of axilloinguinal pathway, then L breast moving fluid towards pathways spending extra time in any areas of fibrosis at lateral breast then retracing all steps  Issued info for pt to obtain a swell spot for her breast to help decrease lymphedema and heal seroma  11/26/23: Manual Therapy: In supine: Short neck, 5 diaphragmatic breaths, R axillary nodes and establishment of  interaxillary pathway, L inguinal nodes and establishment of axilloinguinal pathway, then L breast moving fluid towards pathways spending extra time in any areas of fibrosis at lateral breast then retracing all steps  STM In R sidelying to L serratus and lateral breast in area of tightness/fibrosis with pt reporting increased tenderness today then back to supine to complete MLD  11/21/23: Manual Therapy: In supine: Short neck, 5 diaphragmatic breaths, R axillary nodes and establishment of interaxillary pathway, L inguinal nodes and establishment of axilloinguinal pathway, then L breast moving fluid towards pathways spending extra time in any areas of fibrosis then retracing all steps then L UE working proximal to distal with increased time spent at axilla and upper arm then retracing all steps.  MFR to L axilla STM using cocoa butter and WAVE tool In R sidelying to L serratus and lateral breast in area of tightness/fibrosis with good  improvement noted by end of session  11/19/23: Manual Therapy: In supine: Short neck, 5 diaphragmatic breaths, R axillary nodes and establishment of interaxillary pathway, L inguinal nodes and establishment of axilloinguinal pathway, then L breast moving fluid towards pathways spending extra time in any areas of fibrosis then retracing all steps then L UE working proximal to distal with increased time spent at axilla and upper arm then retracing all steps.  STM using cocoa butter In R sidelying to L serratus and lateral breast in area of tightness/fibrosis with good improvement noted by end of session  11/14/23: Manual Therapy: In supine: Short neck, 5 diaphragmatic breaths, R axillary nodes and establishment of interaxillary pathway, L inguinal nodes and establishment of axilloinguinal pathway, then L breast moving fluid towards pathways spending extra time in any areas of fibrosis then retracing all steps then L UE working proximal to distal with increased time spent at  axilla and upper arm then retracing all steps.  STM using cocoa butter In R sidelying to L serratus and lateral breast in area of tightness/fibrosis with good improvement noted by end of session     The patient was assessed using the L-Dex machine today to produce a lymphedema index baseline score. The patient will be reassessed on a regular basis (typically every 3 months) to obtain new L-Dex scores. If the score is > 6.5 points away from his/her baseline score indicating onset of subclinical lymphedema, it will be recommended to wear a compression garment for 4 weeks, 12 hours per day and then be reassessed. If the score continues to be > 6.5 points from baseline at reassessment, we will initiate lymphedema treatment. Assessing in this manner has a 95% rate of preventing clinically significant lymphedema.   11/12/23: Manual Therapy: In supine: Short neck, 5 diaphragmatic breaths, R axillary nodes and establishment of interaxillary pathway, L inguinal nodes and establishment of axilloinguinal pathway, then L breast moving fluid towards pathways spending extra time in any areas of fibrosis then retracing all steps then L UE working proximal to distal with increased time spent at axilla and upper arm then retracing all steps.  MFR to area of cording with UE in to abduction with cording more difficult to palpate but definite areas of tightness In R sidelying to L serratus and lateral breast in area of tightness/fibrosis    PATIENT EDUCATION:  Wear compression bra 12 hrs/day for 4 weeks during day time hours Access Code: [email or web address] URL: [email or web address] Date: 08/06/2023 Prepared by: Grayce Sheldon  Exercises - Single Arm Doorway Pec Stretch at 90 Degrees Abduction  - 2 x daily - 7 x weekly - 1 sets - 3 reps - 20-30 hold - Supine Lower Trunk Rotation  - 1 x daily - 7 x weekly - 1 sets - 3 reps - 20-30 hold Education details: nerve desensitization, supine dowel exercises,  lymphedema risk reduction and importance of skin care Person educated: Patient Education method: Explanation, Demonstration, Tactile cues, and Handouts Education comprehension: verbalized understanding and returned demonstration  HOME EXERCISE PROGRAM: Reviewed previously given post op HEP. Supine dowel exercises in to abduction and flexion  ASSESSMENT:  CLINICAL IMPRESSION: Pt reports she developed some burning pain in her L breast. There was som increased fibrosis in area of seroma and in inferior breast. Spent increased time in these areas today.   Pt will benefit from skilled therapeutic intervention to improve on the following deficits: Decreased knowledge of precautions, impaired UE functional use, pain, decreased ROM, postural dysfunction, increased edema.  PT treatment/interventions: ADL/Self care home management, [phone]- PT Re-evaluation, 97110-Therapeutic exercises, 97530- Therapeutic activity, [id number]- Neuromuscular re-education, 97535- Self Care, 02859- Manual therapy, [id number]- Orthotic Initial, and [id number]- Orthotic/Prosthetic subsequent   GOALS: Goals reviewed with patient? Yes  LONG TERM GOALS:  (STG=LTG)  GOALS Name Target Date  Goal status  1 Pt will demonstrate she has regained full shoulder ROM and function post operatively compared to baselines.  Baseline: 08/22/23 MET  2 Pt will report no pain at end range of L shoulder abduction to allow improved function. 08/22/23 MET  3 Pt will be independent in a home exercise program for continued stretching and strengthening.  08/22/23 MET  4 Pt will be independent in self MLD for long term management of L breast lymphedema and swelling in L axilla to decrease risk of infection. 10/29/23 MET 10/29/23  5 Pt will report a 50% improvement in pain in L breast to allow improved comfort.  11/26/23 01/09/24- varies still, some days are better than others 11/26/23- ONGOING Varies - some days the pain is much better but then worsens   6 Pt will  return to the green on the SOZO demonstrating reversal of subclinical lymphedema.  11/26/23 MET 11/14/23 returned to green  7 Pt will demonstrate a 75% improvement in pain and discomfort of L axilla from cording to allow improved comfort and function 11/26/23 MET 11/26/23  8 Pt will obtain a swell spot for additional compression in her compression bra. 01/08/24 MET 01/09/24     PLAN:  PT FREQUENCY/DURATION: 1x/wk for 4 wks  PLAN FOR NEXT SESSION: Cont MLD to L breast and STM to seroma  [facility]  [address], [address]  [place] [address]  [phone]  Self manual lymph drainage: Do circles behind the collar bones x 10  Perform this sequence once a day.  Only give enough pressure no your skin to make the skin move.  Diaphragmatic - Supine   Inhale through nose making navel move out toward hands. Exhale through puckered lips, hands follow navel in. Repeat _5__ times. Rest _10__ seconds between repeats.   Copyright  VHI. All rights reserved.  Hug yourself.  Do circles at your neck just above your collarbones.  Repeat this 10 times.  Axilla - One at a Time   Using full weight of flat hand and fingers at center of uninvolved (R) armpit, make _10__ in-place circles.   Copyright  VHI. All rights reserved.  LEG: Inguinal Nodes Stimulation   With small finger side of hand against hip crease on involved (L) side, gently perform circles at the crease. Repeat __10_ times.   Copyright  VHI. All rights reserved.  Axilla to Inguinal Nodes - Sweep   On involved side, stretch skin from armpit along side of trunk to hip crease.  Now gently stretch skin from the involved side to the uninvolved side across the chest at the shoulder line.    Move fluid from L armpit in area that feels swollen towards these 2 pathways by gently stretching skin.   Finish by doing circles in R armpit and L groin.   Finish by doing the pathways as described above going from  your involved armpit to the same side groin and going across your upper chest from the involved shoulder to the uninvolved shoulder.  Repeat the steps above where you do circles in your left groin and right armpit. Copyright  VHI. All rights reserved.    [facility] [place], PT 01/28/2024,  12:58 PM

## 2024-02-04 ENCOUNTER — Encounter: Payer: Self-pay | Admitting: Physical Therapy

## 2024-02-04 ENCOUNTER — Ambulatory Visit: Payer: Self-pay | Admitting: Physical Therapy

## 2024-02-04 DIAGNOSIS — I890 Lymphedema, not elsewhere classified: Secondary | ICD-10-CM | POA: Diagnosis not present

## 2024-02-04 DIAGNOSIS — R293 Abnormal posture: Secondary | ICD-10-CM

## 2024-02-04 DIAGNOSIS — C50412 Malignant neoplasm of upper-outer quadrant of left female breast: Secondary | ICD-10-CM

## 2024-02-04 DIAGNOSIS — M25612 Stiffness of left shoulder, not elsewhere classified: Secondary | ICD-10-CM

## 2024-02-04 DIAGNOSIS — Z483 Aftercare following surgery for neoplasm: Secondary | ICD-10-CM

## 2024-02-04 NOTE — Therapy (Signed)
 OUTPATIENT PHYSICAL THERAPY BREAST CANCER TREATMENT   Patient Name: Norma [name] MRN: [id number] DOB:[birth year], 53 y.o., female Today's Date: 02/04/2024  END OF SESSION:  PT End of Session - 02/04/24 1601     Visit Number 36    Number of Visits 36    Date for Recertification  02/06/24    PT Start Time 1558    PT Stop Time 1658    PT Time Calculation (min) 60 min    Activity Tolerance Patient tolerated treatment well    Behavior During Therapy [facility] for tasks assessed/performed              Past Medical History:  Diagnosis Date   Allergy    Anemia    hx of   Anxiety    Arthritis    KNEES   Asthma    treated for during COVID tx   Cancer (HCC) 06/2023   left breast IDC   Chronic headache    Colon polyps    TUBULAR ADENOMAS AND HYPERPLASTIC    Complication of anesthesia    DDD (degenerative disc disease), thoracic    Depression    Diabetes mellitus, type 2 (HCC)    Difficult airway for intubation    Fibromyalgia    GERD (gastroesophageal reflux disease)    Goiter    Hyperlipidemia    Hypertension    Low back pain    Obesity    PONV (postoperative nausea and vomiting)    RA (rheumatoid arthritis) (HCC)    Seasonal allergies    Vitamin D  deficiency    Past Surgical History:  Procedure Laterality Date   ABDOMINAL HYSTERECTOMY  2008   BACK SURGERY  2005   lumbar laminectomy   BALLOON DILATION N/A 06/01/2021   Procedure: BALLOON DILATION;  Surgeon: Albertus Gordy HERO, MD;  Location: [facility];  Service: Gastroenterology;  Laterality: N/A;   BARTHOLIN GLAND CYST EXCISION     BIOPSY  06/01/2021   Procedure: BIOPSY;  Surgeon: Albertus Gordy HERO, MD;  Location: [facility];  Service: Gastroenterology;;   BREAST LUMPECTOMY WITH RADIOACTIVE SEED AND SENTINEL LYMPH NODE BIOPSY Left 07/02/2023   Procedure: BREAST LUMPECTOMY WITH RADIOACTIVE SEED AND SENTINEL LYMPH NODE BIOPSY;  Surgeon: Aron Shoulders, MD;  Location: [facility];  Service: General;   Laterality: Left;  90 MIN 78444- EXCISION LEFT CHEST WALL MASS GEN COMBINED WITH REGIONAL   COLONOSCOPY  2017   JMP-MAC-prep good-TA -recall 5 yr   COLONOSCOPY WITH PROPOFOL  N/A 06/01/2021   Procedure: COLONOSCOPY WITH PROPOFOL ;  Surgeon: Albertus Gordy HERO, MD;  Location: [facility];  Service: Gastroenterology;  Laterality: N/A;   ESOPHAGOGASTRODUODENOSCOPY (EGD) WITH PROPOFOL  N/A 06/01/2021   Procedure: ESOPHAGOGASTRODUODENOSCOPY (EGD) WITH PROPOFOL ;  Surgeon: Albertus Gordy HERO, MD;  Location: [facility];  Service: Gastroenterology;  Laterality: N/A;   LUMBAR LAMINECTOMY/DECOMPRESSION MICRODISCECTOMY Right 07/17/2013   Procedure: LUMBAR LAMINECTOMY/DECOMPRESSION MICRODISCECTOMY 1 LEVEL,RIGHT LUMBAR FOUR-FIVE;  Surgeon: Darina MALVA Boehringer, MD;  Location: [facility];  Service: Neurosurgery;  Laterality: Right;  Right   MASS EXCISION Left 07/02/2023   Procedure: EXCISION, MASS, CHEST WALL;  Surgeon: Aron Shoulders, MD;  Location: [facility];  Service: General;  Laterality: Left;   PARTIAL HYSTERECTOMY     POLYPECTOMY  2017   TA and benign polypoid   POLYPECTOMY  06/01/2021   Procedure: POLYPECTOMY;  Surgeon: Albertus Gordy HERO, MD;  Location: [facility];  Service: Gastroenterology;;   WISDOM TOOTH EXTRACTION     Patient Active Problem List  Diagnosis Date Noted   Genetic testing 06/23/2023   Malignant neoplasm of upper-outer quadrant of left breast in female, estrogen receptor positive (HCC) 06/11/2023   Gastritis without bleeding    Esophageal dysphagia    Schatzki's ring    Benign neoplasm of ascending colon    Class 3 severe obesity with serious comorbidity and body mass index (BMI) of 40.0 to 44.9 in adult (HCC) 08/25/2019   Type 2 diabetes mellitus without complication, without long-term current use of insulin (HCC) 08/25/2019   Chronic cough 06/27/2017   LUQ abdominal pain 12/07/2014   RUQ abdominal pain 11/10/2013   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   Gastroesophageal  reflux disease 12/18/2012   Hx of adenomatous colonic polyps 12/18/2012   Chronic RLQ pain 12/18/2012   IBS (irritable bowel syndrome) 01/22/2011   HTN (hypertension) 01/22/2011   Hyperlipidemia 01/22/2011   Anxiety and depression 01/22/2011    PCP: Haze Servant, NP  REFERRING PROVIDER: Dr. Jina Nephew   REFERRING DIAG: Left breast cancer  THERAPY DIAG:  Lymphedema, not elsewhere classified  Stiffness of left shoulder, not elsewhere classified  Aftercare following surgery for neoplasm  Abnormal posture  Malignant neoplasm of upper-outer quadrant of left breast in female, estrogen receptor positive (HCC)  Rationale for Evaluation and Treatment: Rehabilitation  ONSET DATE: 04/26/23  SUBJECTIVE:                                                                                                                                                                                           SUBJECTIVE STATEMENT: I had pain after my appointment last time. I have been using the Voltaren  like the doctor told me and I think it has been helping.   PERTINENT HISTORY:  Patient was diagnosed on 04/26/2023 with left grade 3 invasive ductal carcinoma breast cancer. It measures 1 cm and is located in the upper outer quadrant. It is ER/PR positive and HER2 negative with a Ki67 of 30%. Pt underwent a L lumpectomy and SLNB 0/2 on 07/02/23  PATIENT GOALS:  Reassess how my recovery is going related to arm function, pain, and swelling.  PAIN:  Are you having pain?  None  PRECAUTIONS: Recent Surgery, left UE Lymphedema risk,   RED FLAGS: None   ACTIVITY LEVEL / LEISURE: doing post op exercises   OBJECTIVE:   PATIENT SURVEYS:  QUICK DASH:     OBSERVATIONS: Healing scars with some increased scar tissue at L axilla  POSTURE:  Forward head and rounded shoulders posture   UPPER EXTREMITY AROM/PROM:   A/PROM RIGHT   eval    Shoulder extension 48  Shoulder flexion 153  Shoulder  abduction 165  Shoulder internal rotation 63  Shoulder external rotation 84                          (Blank rows = not tested)   A/PROM LEFT   eval LEFT  07/25/23 LEFT 08/08/23 LEFT 10/01/23  Shoulder extension 35 70    Shoulder flexion 150 162 171 176  Shoulder abduction 167 156 151 163  Shoulder internal rotation 63 69    Shoulder external rotation 75 80                            (Blank rows = not tested)   CERVICAL AROM: All within normal limits   UPPER EXTREMITY STRENGTH: WFL   LYMPHEDEMA ASSESSMENTS (in cm):    LANDMARK RIGHT   eval  10 cm proximal to olecranon process 38.8  Olecranon process 28.7  10 cm proximal to ulnar styloid process 25.8  Just proximal to ulnar styloid process 17  Across hand at thumb web space 20.5  At base of 2nd digit 5.9  (Blank rows = not tested)   LANDMARK LEFT   eval LEFT 07/25/23  10 cm proximal to olecranon process 40 38.4  Olecranon process 28.8 29.5  10 cm proximal to ulnar styloid process 24.3 23  Just proximal to ulnar styloid process 16.3 16.5  Across hand at thumb web space 19.5 20  At base of 2nd digit 5.9 5.8  (Blank rows = not tested)  Surgery type/Date: 07/02/23 L breast lumpectomy and SLNB Number of lymph nodes removed: 0/2 Current/past treatment (chemo, radiation, hormone therapy): does not need chemo, will require radiation, will need hormone therapy Other symptoms:  Heaviness/tightness Yes Pain Yes Pitting edema No Infections No Decreased scar mobility Yes Stemmer sign No     TREATMENT PERFORMED: 02/04/24 Manual Therapy: MLD: In supine: Short neck, 5 diaphragmatic breaths, R axillary nodes and establishment of interaxillary pathway, L inguinal nodes and establishment of axilloinguinal pathway, then L breast moving fluid towards pathways, L UE moving fluid towards pathways, then to R sidelying working on L lateral trunk moving fluid towards pathway at side and to posterior inter axillary pathway spending extra  time in any areas of fibrosis at lateral breast then retracing all steps in supine. Seroma felt softer today compared to last session.  STM to area of seroma at lateral breast and to lateral trunk  01/28/24 Manual Therapy: MLD: In supine: Short neck, 5 diaphragmatic breaths, R axillary nodes and establishment of interaxillary pathway, L inguinal nodes and establishment of axilloinguinal pathway, then L breast moving fluid towards pathways, L UE moving fluid towards pathways, then to R sidelying working on L lateral trunk moving fluid towards pathway at side and to posterior inter axillary pathway spending extra time in any areas of fibrosis at lateral breast then retracing all steps in supine. Seroma felt more fibrotic today and there was increased fibrosis at inferior breast STM to area of seroma at lateral breast and to lateral trunk  01/20/24 Manual Therapy: MLD: In supine: Short neck, 5 diaphragmatic breaths, R axillary nodes and establishment of interaxillary pathway, L inguinal nodes and establishment of axilloinguinal pathway, then L breast moving fluid towards pathways, L UE moving fluid towards pathways, then to R sidelying working on L lateral trunk moving fluid towards pathway at side and to posterior inter axillary pathway spending extra time in any areas of fibrosis at lateral breast  then retracing all steps in supine. Seroma felt softer today and breast lymphedema was slight more fibrotic STM to area of seroma at lateral breast and to lateral trunk  01/14/24 SOZO screening repeated:   The patient was assessed using the L-Dex machine today to produce a lymphedema index baseline score. The patient will be reassessed on a regular basis (typically every 3 months) to obtain new L-Dex scores. If the score is > 6.5 points away from his/her baseline score indicating onset of subclinical lymphedema, it will be recommended to wear a compression garment for 4 weeks, 12 hours per day and then be  reassessed. If the score continues to be > 6.5 points from baseline at reassessment, we will initiate lymphedema treatment. Assessing in this manner has a 95% rate of preventing clinically significant lymphedema. Educated pt to begin wearing her compression sleeve consistently every day during waking hours Manual Therapy: MLD: In supine: Short neck, 5 diaphragmatic breaths, R axillary nodes and establishment of interaxillary pathway, L inguinal nodes and establishment of axilloinguinal pathway, then L breast moving fluid towards pathways, L UE moving fluid towards pathways, then to R sidelying working on L lateral trunk moving fluid towards pathway at side and to posterior inter axillary pathway spending extra time in any areas of fibrosis at lateral breast then retracing all steps in supine. Seroma felt softer today and breast lymphedema was visibly reduced. STM to area of seroma at lateral breast and to lateral trunk  01/09/24 Manual Therapy: MLD: In supine: Short neck, 5 diaphragmatic breaths, R axillary nodes and establishment of interaxillary pathway, L inguinal nodes and establishment of axilloinguinal pathway, then L breast moving fluid towards pathways then to R sidelying working on L lateral trunk moving fluid towards pathway at side and to posterior inter axillary pathway spending extra time in any areas of fibrosis at lateral breast then retracing all steps in supine. Palpated seroma today and it felt more firm and larger. .  STM to area of seroma at lateral breast and to lateral trunk with decreased pain noted afterwards  01/07/24 Manual Therapy: MLD: In supine: Short neck, 5 diaphragmatic breaths, R axillary nodes and establishment of interaxillary pathway, L inguinal nodes and establishment of axilloinguinal pathway, then L breast moving fluid towards pathways then to R sidelying working on L lateral trunk moving fluid towards pathway at side and to posterior inter axillary pathway  spending extra time in any areas of fibrosis at lateral breast then retracing all steps in supine. Palpated seroma today and it felt more firm and larger. Palpated in supine and sitting.  STM to area of seroma at lateral breast and to lateral trunk with decreased pain noted afterwards  01/01/24 Manual Therapy: MLD: In supine: Short neck, 5 diaphragmatic breaths, R axillary nodes and establishment of interaxillary pathway, L inguinal nodes and establishment of axilloinguinal pathway, then L breast moving fluid towards pathways then to R sidelying working on L lateral trunk moving fluid towards pathway at side and to posterior inter axillary pathway spending extra time in any areas of fibrosis at lateral breast then retracing all steps in supine. Area of fibrosis at lateral breast/trunk is softer and more pliable today. STM to area of seroma at lateral breast and area of fibrosis at lateral inferior breast which is also softer today  12/30/23 Manual Therapy: MLD: In supine: Short neck, 5 diaphragmatic breaths, R axillary nodes and establishment of interaxillary pathway, L inguinal nodes and establishment of axilloinguinal pathway, then L breast moving fluid  towards pathways then to R sidelying working on L lateral trunk moving fluid towards pathway at side and to posterior inter axillary pathway spending extra time in any areas of fibrosis at lateral breast then retracing all steps in supine.  STM to area of seroma at lateral breast and area of fibrosis at lateral inferior breast  12/26/23 Manual Therapy: MLD: In supine: Short neck, 5 diaphragmatic breaths, R axillary nodes and establishment of interaxillary pathway, L inguinal nodes and establishment of axilloinguinal pathway, then L breast moving fluid towards pathways then to R sidelying working on L lateral trunk moving fluid towards pathway at side and to posterior inter axillary pathway spending extra time in any areas of fibrosis at lateral  breast then retracing all steps in supine.  STM to area of seroma at lateral breast and area of fibrosis at lateral inferior breast   12/24/23 Manual Therapy: MLD: In supine: Short neck, 5 diaphragmatic breaths, R axillary nodes and establishment of interaxillary pathway, L inguinal nodes and establishment of axilloinguinal pathway, then L breast moving fluid towards pathways then to R sidelying working on L lateral trunk moving fluid towards pathway at side and to posterior inter axillary pathway spending extra time in any areas of fibrosis at lateral breast then retracing all steps in supine.  STM to area of seroma at lateral breast - area felt softer today  12/18/23: Manual Therapy: Discussed taking the swell spot out of the bra if she has any discomfort or to readjust throughout the day MLD: In supine: Short neck, 5 diaphragmatic breaths, R axillary nodes and establishment of interaxillary pathway, L inguinal nodes and establishment of axilloinguinal pathway, then L breast moving fluid towards pathways spending extra time in any areas of fibrosis at lateral breast then retracing all steps. Spent increased time at medial and lateral breast where she had increased discomfort.   12/16/23: Manual Therapy: Discussed how to wear the swell spot in the bra and how it is ok to sleep in it STM to area of seroma and fibrosis just lateral to areola with pitting edema noted in this area MLD: In supine: Short neck, 5 diaphragmatic breaths, R axillary nodes and establishment of interaxillary pathway, L inguinal nodes and establishment of axilloinguinal pathway, then L breast moving fluid towards pathways spending extra time in any areas of fibrosis at lateral breast then retracing all steps   12/11/23: Manual Therapy: Discussed how a seroma is different than lymphedema and the importnace of increasing compression to help the seroma heal Created foam chip pack and placed in thick stockinette for pt to wear  in her bra for additional compression STM to area of seroma and fibrosis just lateral to areola with pitting edema noted in this area MLD: In supine: Short neck, 5 diaphragmatic breaths, R axillary nodes and establishment of interaxillary pathway, L inguinal nodes and establishment of axilloinguinal pathway, then L breast moving fluid towards pathways spending extra time in any areas of fibrosis at lateral breast then retracing all steps  Issued info for pt to obtain a swell spot for her breast to help decrease lymphedema and heal seroma  11/26/23: Manual Therapy: In supine: Short neck, 5 diaphragmatic breaths, R axillary nodes and establishment of interaxillary pathway, L inguinal nodes and establishment of axilloinguinal pathway, then L breast moving fluid towards pathways spending extra time in any areas of fibrosis at lateral breast then retracing all steps  STM In R sidelying to L serratus and lateral breast in area of tightness/fibrosis with  pt reporting increased tenderness today then back to supine to complete MLD  11/21/23: Manual Therapy: In supine: Short neck, 5 diaphragmatic breaths, R axillary nodes and establishment of interaxillary pathway, L inguinal nodes and establishment of axilloinguinal pathway, then L breast moving fluid towards pathways spending extra time in any areas of fibrosis then retracing all steps then L UE working proximal to distal with increased time spent at axilla and upper arm then retracing all steps.  MFR to L axilla STM using cocoa butter and WAVE tool In R sidelying to L serratus and lateral breast in area of tightness/fibrosis with good improvement noted by end of session  11/19/23: Manual Therapy: In supine: Short neck, 5 diaphragmatic breaths, R axillary nodes and establishment of interaxillary pathway, L inguinal nodes and establishment of axilloinguinal pathway, then L breast moving fluid towards pathways spending extra time in any areas of fibrosis then  retracing all steps then L UE working proximal to distal with increased time spent at axilla and upper arm then retracing all steps.  STM using cocoa butter In R sidelying to L serratus and lateral breast in area of tightness/fibrosis with good improvement noted by end of session  11/14/23: Manual Therapy: In supine: Short neck, 5 diaphragmatic breaths, R axillary nodes and establishment of interaxillary pathway, L inguinal nodes and establishment of axilloinguinal pathway, then L breast moving fluid towards pathways spending extra time in any areas of fibrosis then retracing all steps then L UE working proximal to distal with increased time spent at axilla and upper arm then retracing all steps.  STM using cocoa butter In R sidelying to L serratus and lateral breast in area of tightness/fibrosis with good improvement noted by end of session     The patient was assessed using the L-Dex machine today to produce a lymphedema index baseline score. The patient will be reassessed on a regular basis (typically every 3 months) to obtain new L-Dex scores. If the score is > 6.5 points away from his/her baseline score indicating onset of subclinical lymphedema, it will be recommended to wear a compression garment for 4 weeks, 12 hours per day and then be reassessed. If the score continues to be > 6.5 points from baseline at reassessment, we will initiate lymphedema treatment. Assessing in this manner has a 95% rate of preventing clinically significant lymphedema.   11/12/23: Manual Therapy: In supine: Short neck, 5 diaphragmatic breaths, R axillary nodes and establishment of interaxillary pathway, L inguinal nodes and establishment of axilloinguinal pathway, then L breast moving fluid towards pathways spending extra time in any areas of fibrosis then retracing all steps then L UE working proximal to distal with increased time spent at axilla and upper arm then retracing all steps.  MFR to area of cording with UE  in to abduction with cording more difficult to palpate but definite areas of tightness In R sidelying to L serratus and lateral breast in area of tightness/fibrosis    PATIENT EDUCATION:  Wear compression bra 12 hrs/day for 4 weeks during day time hours Access Code: [email or web address] URL: [email or web address] Date: 08/06/2023 Prepared by: Grayce Sheldon  Exercises - Single Arm Doorway Pec Stretch at 90 Degrees Abduction  - 2 x daily - 7 x weekly - 1 sets - 3 reps - 20-30 hold - Supine Lower Trunk Rotation  - 1 x daily - 7 x weekly - 1 sets - 3 reps - 20-30 hold Education details: nerve desensitization, supine dowel exercises, lymphedema risk reduction and importance of  skin care Person educated: Patient Education method: Explanation, Demonstration, Tactile cues, and Handouts Education comprehension: verbalized understanding and returned demonstration  HOME EXERCISE PROGRAM: Reviewed previously given post op HEP. Supine dowel exercises in to abduction and flexion  ASSESSMENT:  CLINICAL IMPRESSION: Assessed progress towards goals in therapy. All goals are met and her breat pain goal is partially met since this varies day to day. Pt is independent in self MLD and uses a compression pump daily. Her seroma has softened since beginning therapy and her trigger points in L serratus have improved. Pt will be discharged from skilled PT services at this time but will continue with ldex screenings every 3 months for the first 2 years post op.   Pt will benefit from skilled therapeutic intervention to improve on the following deficits: Decreased knowledge of precautions, impaired UE functional use, pain, decreased ROM, postural dysfunction, increased edema.   PT treatment/interventions: ADL/Self care home management, [phone]- PT Re-evaluation, 97110-Therapeutic exercises, 97530- Therapeutic activity, [id number]- Neuromuscular re-education, 97535- Self Care, 02859- Manual therapy, [id number]- Orthotic  Initial, and [id number]- Orthotic/Prosthetic subsequent   GOALS: Goals reviewed with patient? Yes  LONG TERM GOALS:  (STG=LTG)  GOALS Name Target Date  Goal status  1 Pt will demonstrate she has regained full shoulder ROM and function post operatively compared to baselines.  Baseline: 08/22/23 MET  2 Pt will report no pain at end range of L shoulder abduction to allow improved function. 08/22/23 MET  3 Pt will be independent in a home exercise program for continued stretching and strengthening.  08/22/23 MET  4 Pt will be independent in self MLD for long term management of L breast lymphedema and swelling in L axilla to decrease risk of infection. 10/29/23 MET 10/29/23  5 Pt will report a 50% improvement in pain in L breast to allow improved comfort.  11/26/23 02/04/24- PARTIALLY MET pt reports it still depends because she has good days and bad days10/30/25- varies still, some days are better than others 11/26/23- ONGOING Varies - some days the pain is much better but then worsens   6 Pt will return to the green on the SOZO demonstrating reversal of subclinical lymphedema.  11/26/23 MET 11/14/23 returned to green  7 Pt will demonstrate a 75% improvement in pain and discomfort of L axilla from cording to allow improved comfort and function 11/26/23 MET 11/26/23  8 Pt will obtain a swell spot for additional compression in her compression bra. 01/08/24 MET 01/09/24     PLAN:  PT FREQUENCY/DURATION: 1x/wk for 4 wks  PLAN FOR NEXT SESSION: d/c this session  [facility]  [address], [address]  [place] [address]  [phone]  Self manual lymph drainage: Do circles behind the collar bones x 10  Perform this sequence once a day.  Only give enough pressure no your skin to make the skin move.  Diaphragmatic - Supine   Inhale through nose making navel move out toward hands. Exhale through puckered lips, hands follow navel in. Repeat _5__ times. Rest _10__ seconds between  repeats.   Copyright  VHI. All rights reserved.  Hug yourself.  Do circles at your neck just above your collarbones.  Repeat this 10 times.  Axilla - One at a Time   Using full weight of flat hand and fingers at center of uninvolved (R) armpit, make _10__ in-place circles.   Copyright  VHI. All rights reserved.  LEG: Inguinal Nodes Stimulation   With small finger side of hand against  hip crease on involved (L) side, gently perform circles at the crease. Repeat __10_ times.   Copyright  VHI. All rights reserved.  Axilla to Inguinal Nodes - Sweep   On involved side, stretch skin from armpit along side of trunk to hip crease.  Now gently stretch skin from the involved side to the uninvolved side across the chest at the shoulder line.    Move fluid from L armpit in area that feels swollen towards these 2 pathways by gently stretching skin.   Finish by doing circles in R armpit and L groin.   Finish by doing the pathways as described above going from your involved armpit to the same side groin and going across your upper chest from the involved shoulder to the uninvolved shoulder.  Repeat the steps above where you do circles in your left groin and right armpit. Copyright  [employer]. All rights reserved.    Florina Sever [place], PT 02/04/2024, 5:01 PM  PHYSICAL THERAPY DISCHARGE SUMMARY  Visits from Start of Care: 36  Current functional level related to goals / functional outcomes: All goals met   Remaining deficits: Still has pain in L breast that is intermittent and is worse on some days than others   Education / Equipment: MLD, compression sleeve, compression bra, swell spot, compression pump   Patient agrees to discharge. Patient goals were met. Patient is being discharged due to meeting the stated rehab goals.  Florina Sever [place], [place] 02/04/24 5:01 PM

## 2024-02-13 ENCOUNTER — Other Ambulatory Visit: Payer: Self-pay | Admitting: Obstetrics and Gynecology

## 2024-02-13 DIAGNOSIS — R1031 Right lower quadrant pain: Secondary | ICD-10-CM

## 2024-02-15 ENCOUNTER — Other Ambulatory Visit: Payer: Self-pay | Admitting: Nurse Practitioner

## 2024-02-15 DIAGNOSIS — E119 Type 2 diabetes mellitus without complications: Secondary | ICD-10-CM

## 2024-02-17 ENCOUNTER — Ambulatory Visit: Payer: Self-pay | Attending: General Surgery

## 2024-02-17 VITALS — Wt 240.5 lb

## 2024-02-17 DIAGNOSIS — Z483 Aftercare following surgery for neoplasm: Secondary | ICD-10-CM | POA: Insufficient documentation

## 2024-02-17 NOTE — Therapy (Signed)
 OUTPATIENT PHYSICAL THERAPY SOZO SCREENING NOTE   Patient Name: Norma [name] MRN: [id number] DOB:1970-07-02, 53 y.o., female Today's Date: 02/17/2024  PCP: Theotis Haze ORN, NP REFERRING PROVIDER: Aron Shoulders, MD   PT End of Session - 02/17/24 1632     Visit Number 36   # unchanged due to screen only   PT Start Time 1630    PT Stop Time 1634    PT Time Calculation (min) 4 min    Activity Tolerance Patient tolerated treatment well    Behavior During Therapy [facility] for tasks assessed/performed          Past Medical History:  Diagnosis Date   Allergy    Anemia    hx of   Anxiety    Arthritis    KNEES   Asthma    treated for during COVID tx   Cancer (HCC) 06/2023   left breast IDC   Chronic headache    Colon polyps    TUBULAR ADENOMAS AND HYPERPLASTIC    Complication of anesthesia    DDD (degenerative disc disease), thoracic    Depression    Diabetes mellitus, type 2 (HCC)    Difficult airway for intubation    Fibromyalgia    GERD (gastroesophageal reflux disease)    Goiter    Hyperlipidemia    Hypertension    Low back pain    Obesity    PONV (postoperative nausea and vomiting)    RA (rheumatoid arthritis) (HCC)    Seasonal allergies    Vitamin D  deficiency    Past Surgical History:  Procedure Laterality Date   ABDOMINAL HYSTERECTOMY  2008   BACK SURGERY  2005   lumbar laminectomy   BALLOON DILATION N/A 06/01/2021   Procedure: BALLOON DILATION;  Surgeon: Albertus Gordy HERO, MD;  Location: [facility];  Service: Gastroenterology;  Laterality: N/A;   BARTHOLIN GLAND CYST EXCISION     BIOPSY  06/01/2021   Procedure: BIOPSY;  Surgeon: Albertus Gordy HERO, MD;  Location: [facility];  Service: Gastroenterology;;   BREAST LUMPECTOMY WITH RADIOACTIVE SEED AND SENTINEL LYMPH NODE BIOPSY Left 07/02/2023   Procedure: BREAST LUMPECTOMY WITH RADIOACTIVE SEED AND SENTINEL LYMPH NODE BIOPSY;  Surgeon: Aron Shoulders, MD;  Location: [facility];  Service: General;   Laterality: Left;  90 MIN 78444- EXCISION LEFT CHEST WALL MASS GEN COMBINED WITH REGIONAL   COLONOSCOPY  2017   JMP-MAC-prep good-TA -recall 5 yr   COLONOSCOPY WITH PROPOFOL  N/A 06/01/2021   Procedure: COLONOSCOPY WITH PROPOFOL ;  Surgeon: Albertus Gordy HERO, MD;  Location: [facility];  Service: Gastroenterology;  Laterality: N/A;   ESOPHAGOGASTRODUODENOSCOPY (EGD) WITH PROPOFOL  N/A 06/01/2021   Procedure: ESOPHAGOGASTRODUODENOSCOPY (EGD) WITH PROPOFOL ;  Surgeon: Albertus Gordy HERO, MD;  Location: [facility];  Service: Gastroenterology;  Laterality: N/A;   LUMBAR LAMINECTOMY/DECOMPRESSION MICRODISCECTOMY Right 07/17/2013   Procedure: LUMBAR LAMINECTOMY/DECOMPRESSION MICRODISCECTOMY 1 LEVEL,RIGHT LUMBAR FOUR-FIVE;  Surgeon: Darina MALVA Boehringer, MD;  Location: [facility];  Service: Neurosurgery;  Laterality: Right;  Right   MASS EXCISION Left 07/02/2023   Procedure: EXCISION, MASS, CHEST WALL;  Surgeon: Aron Shoulders, MD;  Location: [facility];  Service: General;  Laterality: Left;   PARTIAL HYSTERECTOMY     POLYPECTOMY  2017   TA and benign polypoid   POLYPECTOMY  06/01/2021   Procedure: POLYPECTOMY;  Surgeon: Albertus Gordy HERO, MD;  Location: [facility];  Service: Gastroenterology;;   WISDOM TOOTH EXTRACTION     Patient Active Problem List   Diagnosis Date  Noted   Genetic testing 06/23/2023   Malignant neoplasm of upper-outer quadrant of left breast in female, estrogen receptor positive (HCC) 06/11/2023   Gastritis without bleeding    Esophageal dysphagia    Schatzki's ring    Benign neoplasm of ascending colon    Class 3 severe obesity with serious comorbidity and body mass index (BMI) of 40.0 to 44.9 in adult (HCC) 08/25/2019   Type 2 diabetes mellitus without complication, without long-term current use of insulin (HCC) 08/25/2019   Chronic cough 06/27/2017   LUQ abdominal pain 12/07/2014   RUQ abdominal pain 11/10/2013   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   Gastroesophageal  reflux disease 12/18/2012   Hx of adenomatous colonic polyps 12/18/2012   Chronic RLQ pain 12/18/2012   IBS (irritable bowel syndrome) 01/22/2011   HTN (hypertension) 01/22/2011   Hyperlipidemia 01/22/2011   Anxiety and depression 01/22/2011    REFERRING DIAG: left breast cancer at risk for lymphedema  THERAPY DIAG: Aftercare following surgery for neoplasm  PERTINENT HISTORY: Patient was diagnosed on 04/26/2023 with left grade 3 invasive ductal carcinoma breast cancer. It measures 1 cm and is located in the upper outer quadrant. It is ER/PR positive and HER2 negative with a Ki67 of 30%. Pt underwent a L lumpectomy and SLNB 0/2 on 07/02/23  PRECAUTIONS: left UE Lymphedema risk, None  SUBJECTIVE: Pt returns for her 3 month L-Dex screen.   PAIN:  Are you having pain? No  SOZO SCREENING: Patient was assessed today using the SOZO machine to determine the lymphedema index score. This was compared to her baseline score. It was determined that she is within the recommended range when compared to her baseline and no further action is needed at this time. She will continue SOZO screenings. These are done every 3 months for 2 years post operatively followed by every 6 months for 2 years, and then annually.   L-DEX FLOWSHEETS - 02/17/24 1600       L-DEX LYMPHEDEMA SCREENING   Measurement Type Unilateral    L-DEX MEASUREMENT EXTREMITY Upper Extremity    POSITION  Standing    DOMINANT SIDE Right    At Risk Side Left    BASELINE SCORE (UNILATERAL) -0.7    L-DEX SCORE (UNILATERAL) -1.9    VALUE CHANGE (UNILAT) -1.2             Aden Berwyn [place], PTA 02/17/2024, 4:35 PM

## 2024-02-17 NOTE — Telephone Encounter (Signed)
 Pt put this on hold because it conflicted with the flexitouch. She had refills available but she stopped the dexcom G7 sensors instead of placing them on hold because she was not given the option. So she just needs a new prescription since she never received the previous refills please advise.

## 2024-02-18 ENCOUNTER — Inpatient Hospital Stay
Admission: RE | Admit: 2024-02-18 | Discharge: 2024-02-18 | Attending: Obstetrics and Gynecology | Admitting: Obstetrics and Gynecology

## 2024-02-18 DIAGNOSIS — R1031 Right lower quadrant pain: Secondary | ICD-10-CM

## 2024-03-20 ENCOUNTER — Ambulatory Visit: Admitting: Nurse Practitioner

## 2024-03-20 ENCOUNTER — Other Ambulatory Visit: Payer: Self-pay | Admitting: Nurse Practitioner

## 2024-03-20 DIAGNOSIS — I10 Essential (primary) hypertension: Secondary | ICD-10-CM

## 2024-04-10 ENCOUNTER — Encounter: Payer: Self-pay | Admitting: Nurse Practitioner

## 2024-04-14 ENCOUNTER — Ambulatory Visit: Admitting: Nurse Practitioner

## 2024-05-18 ENCOUNTER — Ambulatory Visit: Attending: General Surgery

## 2024-06-11 ENCOUNTER — Other Ambulatory Visit (HOSPITAL_BASED_OUTPATIENT_CLINIC_OR_DEPARTMENT_OTHER)

## 2024-06-23 ENCOUNTER — Ambulatory Visit: Admitting: Nurse Practitioner

## 2024-07-28 ENCOUNTER — Inpatient Hospital Stay: Admitting: Hematology and Oncology
